# Patient Record
Sex: Male | Born: 1935 | Race: White | Hispanic: No | Marital: Married | Smoking: Former smoker
Health system: Southern US, Community
[De-identification: ages and names within clinical notes are randomized; demographics above are authoritative.]

## PROBLEM LIST (undated history)

## (undated) DIAGNOSIS — E119 Type 2 diabetes mellitus without complications: Secondary | ICD-10-CM

## (undated) DIAGNOSIS — Z87442 Personal history of urinary calculi: Secondary | ICD-10-CM

## (undated) DIAGNOSIS — I10 Essential (primary) hypertension: Secondary | ICD-10-CM

## (undated) DIAGNOSIS — N183 Chronic kidney disease, stage 3 unspecified: Secondary | ICD-10-CM

## (undated) DIAGNOSIS — Q613 Polycystic kidney, unspecified: Secondary | ICD-10-CM

## (undated) DIAGNOSIS — E785 Hyperlipidemia, unspecified: Secondary | ICD-10-CM

## (undated) DIAGNOSIS — G4730 Sleep apnea, unspecified: Secondary | ICD-10-CM

## (undated) DIAGNOSIS — F419 Anxiety disorder, unspecified: Secondary | ICD-10-CM

## (undated) DIAGNOSIS — C9110 Chronic lymphocytic leukemia of B-cell type not having achieved remission: Secondary | ICD-10-CM

## (undated) HISTORY — PX: CERVICAL FUSION: SHX112

## (undated) HISTORY — PX: CHOLECYSTECTOMY: SHX55

## (undated) HISTORY — DX: Type 2 diabetes mellitus without complications: E11.9

---

## 1987-12-09 HISTORY — PX: CARDIAC CATHETERIZATION: SHX172

## 2004-11-29 ENCOUNTER — Ambulatory Visit: Payer: Self-pay | Admitting: Urology

## 2005-05-17 ENCOUNTER — Ambulatory Visit: Payer: Self-pay | Admitting: Internal Medicine

## 2005-09-12 ENCOUNTER — Ambulatory Visit: Payer: Self-pay | Admitting: Unknown Physician Specialty

## 2005-12-09 ENCOUNTER — Ambulatory Visit: Payer: Self-pay | Admitting: Internal Medicine

## 2005-12-13 ENCOUNTER — Ambulatory Visit: Payer: Self-pay | Admitting: Internal Medicine

## 2006-01-08 ENCOUNTER — Ambulatory Visit: Payer: Self-pay | Admitting: Internal Medicine

## 2006-03-15 ENCOUNTER — Ambulatory Visit: Payer: Self-pay | Admitting: Internal Medicine

## 2006-04-09 ENCOUNTER — Ambulatory Visit: Payer: Self-pay | Admitting: Internal Medicine

## 2006-06-21 ENCOUNTER — Ambulatory Visit: Payer: Self-pay | Admitting: Internal Medicine

## 2006-07-10 ENCOUNTER — Ambulatory Visit: Payer: Self-pay | Admitting: Internal Medicine

## 2007-02-01 ENCOUNTER — Ambulatory Visit: Payer: Self-pay | Admitting: Internal Medicine

## 2007-06-11 ENCOUNTER — Ambulatory Visit: Payer: Self-pay | Admitting: Internal Medicine

## 2007-06-22 ENCOUNTER — Ambulatory Visit: Payer: Self-pay | Admitting: Internal Medicine

## 2007-06-25 ENCOUNTER — Ambulatory Visit: Payer: Self-pay | Admitting: Internal Medicine

## 2007-07-11 ENCOUNTER — Ambulatory Visit: Payer: Self-pay | Admitting: Internal Medicine

## 2007-09-10 ENCOUNTER — Ambulatory Visit: Payer: Self-pay | Admitting: Internal Medicine

## 2007-09-21 ENCOUNTER — Ambulatory Visit: Payer: Self-pay | Admitting: Internal Medicine

## 2007-10-11 ENCOUNTER — Ambulatory Visit: Payer: Self-pay | Admitting: Internal Medicine

## 2007-11-11 ENCOUNTER — Ambulatory Visit: Payer: Self-pay | Admitting: Internal Medicine

## 2007-12-09 ENCOUNTER — Ambulatory Visit: Payer: Self-pay | Admitting: Internal Medicine

## 2008-01-09 ENCOUNTER — Ambulatory Visit: Payer: Self-pay | Admitting: Internal Medicine

## 2008-02-08 ENCOUNTER — Ambulatory Visit: Payer: Self-pay | Admitting: Internal Medicine

## 2008-02-12 ENCOUNTER — Ambulatory Visit: Payer: Self-pay | Admitting: Internal Medicine

## 2008-08-10 ENCOUNTER — Ambulatory Visit: Payer: Self-pay | Admitting: Internal Medicine

## 2008-08-18 ENCOUNTER — Ambulatory Visit: Payer: Self-pay | Admitting: Internal Medicine

## 2009-02-07 ENCOUNTER — Ambulatory Visit: Payer: Self-pay | Admitting: Internal Medicine

## 2009-02-09 ENCOUNTER — Ambulatory Visit: Payer: Self-pay | Admitting: Internal Medicine

## 2009-03-10 ENCOUNTER — Ambulatory Visit: Payer: Self-pay | Admitting: Internal Medicine

## 2009-08-31 ENCOUNTER — Ambulatory Visit: Payer: Self-pay | Admitting: Internal Medicine

## 2009-12-29 ENCOUNTER — Inpatient Hospital Stay: Payer: Self-pay | Admitting: Podiatry

## 2010-01-08 ENCOUNTER — Emergency Department: Payer: Self-pay | Admitting: Emergency Medicine

## 2010-02-07 ENCOUNTER — Ambulatory Visit: Payer: Self-pay | Admitting: Internal Medicine

## 2010-02-09 ENCOUNTER — Ambulatory Visit: Payer: Self-pay | Admitting: Internal Medicine

## 2010-03-10 ENCOUNTER — Ambulatory Visit: Payer: Self-pay | Admitting: Internal Medicine

## 2010-07-12 ENCOUNTER — Ambulatory Visit: Payer: Self-pay | Admitting: Unknown Physician Specialty

## 2011-02-15 ENCOUNTER — Ambulatory Visit: Payer: Self-pay | Admitting: Internal Medicine

## 2011-03-11 ENCOUNTER — Ambulatory Visit: Payer: Self-pay | Admitting: Internal Medicine

## 2011-04-08 ENCOUNTER — Ambulatory Visit: Payer: Self-pay | Admitting: Vascular Surgery

## 2011-04-12 ENCOUNTER — Ambulatory Visit: Payer: Self-pay | Admitting: Vascular Surgery

## 2012-02-15 ENCOUNTER — Ambulatory Visit: Payer: Self-pay | Admitting: Internal Medicine

## 2012-02-15 LAB — CBC CANCER CENTER
Basophil #: 0 x10 3/mm (ref 0.0–0.1)
Eosinophil #: 0.2 x10 3/mm (ref 0.0–0.7)
HGB: 11.5 g/dL — ABNORMAL LOW (ref 13.0–18.0)
Lymphocyte %: 79.3 %
MCHC: 32.2 g/dL (ref 32.0–36.0)
MCV: 100 fL (ref 80–100)
Monocyte #: 0.5 x10 3/mm (ref 0.2–1.0)
Neutrophil #: 3.3 x10 3/mm (ref 1.4–6.5)
Neutrophil %: 17 %
Platelet: 89 x10 3/mm — ABNORMAL LOW (ref 150–440)
RDW: 14.5 % (ref 11.5–14.5)
WBC: 19.5 x10 3/mm — ABNORMAL HIGH (ref 3.8–10.6)

## 2012-02-15 LAB — CREATININE, SERUM
Creatinine: 1.86 mg/dL — ABNORMAL HIGH (ref 0.60–1.30)
EGFR (African American): 40 — ABNORMAL LOW

## 2012-03-10 ENCOUNTER — Ambulatory Visit: Payer: Self-pay | Admitting: Internal Medicine

## 2012-06-25 ENCOUNTER — Ambulatory Visit: Payer: Self-pay | Admitting: Internal Medicine

## 2013-02-14 ENCOUNTER — Ambulatory Visit: Payer: Self-pay | Admitting: Internal Medicine

## 2013-02-15 LAB — CBC CANCER CENTER
HGB: 12.6 g/dL — ABNORMAL LOW (ref 13.0–18.0)
Lymphocytes: 73 %
MCH: 33.5 pg (ref 26.0–34.0)
MCHC: 33.9 g/dL (ref 32.0–36.0)
MCV: 99 fL (ref 80–100)
Monocytes: 2 %
Platelet: 87 x10 3/mm — ABNORMAL LOW (ref 150–440)
RBC: 3.74 10*6/uL — ABNORMAL LOW (ref 4.40–5.90)
Segmented Neutrophils: 14 %
Variant Lymphocyte: 10 %

## 2013-02-15 LAB — CREATININE, SERUM
EGFR (African American): 37 — ABNORMAL LOW
EGFR (Non-African Amer.): 32 — ABNORMAL LOW

## 2013-02-15 LAB — LACTATE DEHYDROGENASE: LDH: 321 U/L — ABNORMAL HIGH (ref 85–241)

## 2013-03-10 ENCOUNTER — Ambulatory Visit: Payer: Self-pay | Admitting: Internal Medicine

## 2013-05-30 ENCOUNTER — Ambulatory Visit: Payer: Self-pay | Admitting: Internal Medicine

## 2013-06-07 DIAGNOSIS — Z85828 Personal history of other malignant neoplasm of skin: Secondary | ICD-10-CM | POA: Insufficient documentation

## 2013-06-10 ENCOUNTER — Ambulatory Visit: Payer: Self-pay | Admitting: Internal Medicine

## 2014-02-20 ENCOUNTER — Ambulatory Visit: Payer: Self-pay | Admitting: Internal Medicine

## 2014-02-21 LAB — CBC CANCER CENTER
BASOS ABS: 0.1 x10 3/mm (ref 0.0–0.1)
BASOS PCT: 0.3 %
EOS ABS: 0.3 x10 3/mm (ref 0.0–0.7)
Eosinophil %: 1.4 %
HCT: 36.2 % — AB (ref 40.0–52.0)
HGB: 12.1 g/dL — ABNORMAL LOW (ref 13.0–18.0)
LYMPHS PCT: 83.1 %
Lymphocyte #: 16.9 x10 3/mm — ABNORMAL HIGH (ref 1.0–3.6)
MCH: 32.5 pg (ref 26.0–34.0)
MCHC: 33.4 g/dL (ref 32.0–36.0)
MCV: 98 fL (ref 80–100)
Monocyte #: 0.4 x10 3/mm (ref 0.2–1.0)
Monocyte %: 1.8 %
NEUTROS PCT: 13.4 %
Neutrophil #: 2.7 x10 3/mm (ref 1.4–6.5)
Platelet: 76 x10 3/mm — ABNORMAL LOW (ref 150–440)
RBC: 3.71 10*6/uL — AB (ref 4.40–5.90)
RDW: 14.3 % (ref 11.5–14.5)
WBC: 20.3 x10 3/mm — ABNORMAL HIGH (ref 3.8–10.6)

## 2014-02-21 LAB — CREATININE, SERUM
Creatinine: 1.57 mg/dL — ABNORMAL HIGH (ref 0.60–1.30)
EGFR (African American): 49 — ABNORMAL LOW
GFR CALC NON AF AMER: 42 — AB

## 2014-02-21 LAB — LACTATE DEHYDROGENASE: LDH: 355 U/L — AB (ref 85–241)

## 2014-02-24 LAB — URINE IEP, RANDOM

## 2014-02-26 LAB — PROT IMMUNOELECTROPHORES(ARMC)

## 2014-03-10 ENCOUNTER — Ambulatory Visit: Payer: Self-pay | Admitting: Internal Medicine

## 2014-04-17 DIAGNOSIS — E119 Type 2 diabetes mellitus without complications: Secondary | ICD-10-CM | POA: Insufficient documentation

## 2014-04-17 DIAGNOSIS — I714 Abdominal aortic aneurysm, without rupture, unspecified: Secondary | ICD-10-CM | POA: Insufficient documentation

## 2014-04-17 DIAGNOSIS — E785 Hyperlipidemia, unspecified: Secondary | ICD-10-CM | POA: Insufficient documentation

## 2014-04-17 DIAGNOSIS — C9110 Chronic lymphocytic leukemia of B-cell type not having achieved remission: Secondary | ICD-10-CM | POA: Insufficient documentation

## 2014-04-17 DIAGNOSIS — N183 Chronic kidney disease, stage 3 unspecified: Secondary | ICD-10-CM | POA: Insufficient documentation

## 2014-04-17 DIAGNOSIS — I10 Essential (primary) hypertension: Secondary | ICD-10-CM | POA: Insufficient documentation

## 2014-04-17 DIAGNOSIS — N1832 Chronic kidney disease, stage 3b: Secondary | ICD-10-CM | POA: Insufficient documentation

## 2014-04-17 DIAGNOSIS — E1122 Type 2 diabetes mellitus with diabetic chronic kidney disease: Secondary | ICD-10-CM | POA: Insufficient documentation

## 2014-04-17 DIAGNOSIS — G4733 Obstructive sleep apnea (adult) (pediatric): Secondary | ICD-10-CM | POA: Insufficient documentation

## 2014-07-16 DIAGNOSIS — Z794 Long term (current) use of insulin: Secondary | ICD-10-CM | POA: Insufficient documentation

## 2014-11-26 ENCOUNTER — Ambulatory Visit: Payer: Self-pay | Admitting: Internal Medicine

## 2014-12-10 ENCOUNTER — Ambulatory Visit: Payer: Self-pay | Admitting: Internal Medicine

## 2014-12-24 ENCOUNTER — Ambulatory Visit: Payer: Self-pay | Admitting: Ophthalmology

## 2015-01-21 ENCOUNTER — Ambulatory Visit
Admit: 2015-01-21 | Disposition: A | Discharge status: Home / Self Care | Payer: Self-pay | Attending: Ophthalmology | Admitting: Ophthalmology

## 2015-03-19 ENCOUNTER — Other Ambulatory Visit: Payer: Self-pay

## 2015-03-19 DIAGNOSIS — D696 Thrombocytopenia, unspecified: Secondary | ICD-10-CM

## 2015-03-19 DIAGNOSIS — C9110 Chronic lymphocytic leukemia of B-cell type not having achieved remission: Secondary | ICD-10-CM

## 2015-03-20 ENCOUNTER — Inpatient Hospital Stay: Payer: Medicare Other | Attending: Internal Medicine

## 2015-03-20 ENCOUNTER — Inpatient Hospital Stay (HOSPITAL_BASED_OUTPATIENT_CLINIC_OR_DEPARTMENT_OTHER): Payer: Medicare Other | Admitting: Internal Medicine

## 2015-03-20 VITALS — BP 147/80 | HR 53 | Temp 97.1°F | Resp 18 | Ht 67.0 in | Wt 240.1 lb

## 2015-03-20 DIAGNOSIS — I129 Hypertensive chronic kidney disease with stage 1 through stage 4 chronic kidney disease, or unspecified chronic kidney disease: Secondary | ICD-10-CM | POA: Diagnosis not present

## 2015-03-20 DIAGNOSIS — E119 Type 2 diabetes mellitus without complications: Secondary | ICD-10-CM | POA: Insufficient documentation

## 2015-03-20 DIAGNOSIS — Z8679 Personal history of other diseases of the circulatory system: Secondary | ICD-10-CM | POA: Diagnosis not present

## 2015-03-20 DIAGNOSIS — Z79899 Other long term (current) drug therapy: Secondary | ICD-10-CM | POA: Diagnosis not present

## 2015-03-20 DIAGNOSIS — E785 Hyperlipidemia, unspecified: Secondary | ICD-10-CM | POA: Diagnosis not present

## 2015-03-20 DIAGNOSIS — Z7982 Long term (current) use of aspirin: Secondary | ICD-10-CM | POA: Diagnosis not present

## 2015-03-20 DIAGNOSIS — C9110 Chronic lymphocytic leukemia of B-cell type not having achieved remission: Secondary | ICD-10-CM

## 2015-03-20 DIAGNOSIS — L409 Psoriasis, unspecified: Secondary | ICD-10-CM | POA: Insufficient documentation

## 2015-03-20 DIAGNOSIS — N189 Chronic kidney disease, unspecified: Secondary | ICD-10-CM | POA: Diagnosis not present

## 2015-03-20 DIAGNOSIS — D696 Thrombocytopenia, unspecified: Secondary | ICD-10-CM | POA: Diagnosis not present

## 2015-03-20 DIAGNOSIS — G4730 Sleep apnea, unspecified: Secondary | ICD-10-CM | POA: Diagnosis not present

## 2015-03-20 DIAGNOSIS — Z794 Long term (current) use of insulin: Secondary | ICD-10-CM | POA: Diagnosis not present

## 2015-03-20 LAB — CBC WITH DIFFERENTIAL/PLATELET
BASOS ABS: 0 10*3/uL (ref 0–0.1)
Basophils Relative: 0 %
EOS PCT: 1 %
Eosinophils Absolute: 0.3 10*3/uL (ref 0–0.7)
HCT: 36.1 % — ABNORMAL LOW (ref 40.0–52.0)
HEMOGLOBIN: 11.4 g/dL — AB (ref 13.0–18.0)
LYMPHS ABS: 18.1 10*3/uL — AB (ref 1.0–3.6)
LYMPHS PCT: 83 %
MCH: 31.5 pg (ref 26.0–34.0)
MCHC: 31.6 g/dL — ABNORMAL LOW (ref 32.0–36.0)
MCV: 99.7 fL (ref 80.0–100.0)
MONO ABS: 0.4 10*3/uL (ref 0.2–1.0)
MONOS PCT: 2 %
NEUTROS PCT: 14 %
Neutro Abs: 3 10*3/uL (ref 1.4–6.5)
Platelets: 95 10*3/uL — ABNORMAL LOW (ref 150–440)
RBC: 3.62 MIL/uL — ABNORMAL LOW (ref 4.40–5.90)
RDW: 14.9 % — AB (ref 11.5–14.5)
WBC: 21.8 10*3/uL — ABNORMAL HIGH (ref 3.8–10.6)

## 2015-03-20 LAB — CREATININE, SERUM
CREATININE: 1.61 mg/dL — AB (ref 0.61–1.24)
GFR, EST AFRICAN AMERICAN: 46 mL/min — AB (ref >60)
GFR, EST NON AFRICAN AMERICAN: 39 mL/min — AB (ref >60)

## 2015-03-20 LAB — LACTATE DEHYDROGENASE: LDH: 290 U/L — AB (ref 98–192)

## 2015-03-29 NOTE — Progress Notes (Signed)
[name]  Telephone:(336) [phone] Fax:(336) [phone]     ID: Patrick Payne OB: 03-Dec-1933  MR#: [id number]  HYW#:[id number]  Patient Care Team: Idelle Crouch, MD as PCP - General (Internal Medicine)  CHIEF COMPLAINT/DIAGNOSIS:  1. 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 On protocol ([id number]). March 2007 the SIEP showed normal IgA and IgG, IgM level slightly low at 16.   2. 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  HISTORY OF PRESENT ILLNESS:  Patient returns for continued hematology followup for CLL, he was seen one year ago. States that he is doing steady and denies any new complaints. Appetite is good, denies weight loss. Denies fevers or night sweats. He has not had any respiratory infections. No new cough, sputum, chest pain, or hemoptysis. No new bone pains. He has mild thrombocytopenia, denies any bleeding symptoms.States he is physically acitve.  REVIEW OF SYSTEMS:   ROS As in HPI above. In addition, no fever, chills or sweats. No new headaches or focal weakness.  No new mood disturbances. No  sore throat, cough, shortness of breath, sputum, hemoptysis or chest pain. No dizziness or palpitation. No abdominal pain, constipation, diarrhea, dysuria or hematuria. No new skin rash or bleeding symptoms. No new paresthesias in extremities. PS ECOG 0  PAST MEDICAL HISTORY: Reviewed         Diabetes mellitus  Hypertension  Sleep apnea, on CPAP  Obesity  Diverticulosis  Hyperlipidemia  Persistent leukocytosis and lymphocytosis  Nephrolithiasis  Abdominal aortic aneurysm  Polycystic kidney disease  Psoriasis  Cholecystectomy.  PAST SURGICAL HISTORY:Reviewed As above.  FAMILY HISTORY:Reviewed. No history of hematological disorders in the family. Positive for ovarian and lung cancer, diabetes and hypertension.  SOCIAL HISTORY: Reviewed History  Substance Use Topics  . Smoking status: Not on file  . Smokeless  tobacco: Not on file  . Alcohol Use: Not on file  Chronic smoking, quit in 1997, up to 3 packs per day. No history of alcohol use.  ALLERGIES:       Celebrex: Other  Thorazine: Dizzy/Fainting  Penicillin: Rash  Prednisone: Swelling  Invanz: Other  Canagliflozin (Invokana) causes fungal infection.: Other  Mobic: Unknown  Current Outpatient Prescriptions  Medication Sig Dispense Refill  . allopurinol (ZYLOPRIM) 100 MG tablet Take 100 mg by mouth daily.    Marland Kitchen aspirin 81 MG tablet Take 81 mg by mouth daily.    Marland Kitchen atenolol (TENORMIN) 50 MG tablet Take 50 mg by mouth daily.    Marland Kitchen atorvastatin (LIPITOR) 10 MG tablet Take 10 mg by mouth daily.    . Cholecalciferol (VITAMIN D-3) 1000 UNITS CAPS Take 1,000 Units by mouth at bedtime.    . clonazePAM (KLONOPIN) 1 MG tablet Take 1 mg by mouth 2 (two) times daily.    . fexofenadine (ALLEGRA) 180 MG tablet Take 180 mg by mouth daily.    Marland Kitchen GLIPIZIDE ER PO Take 10 mg by mouth daily.    . insulin glargine (LANTUS) 100 UNIT/ML injection Inject 19 Units into the skin at bedtime.    Marland Kitchen LORazepam (ATIVAN) 1 MG tablet Take 1 mg by mouth every 8 (eight) hours.    . Multiple Vitamins-Minerals (PRESERVISION AREDS 2 PO) Take 5 mg by mouth 2 (two) times daily.    Marland Kitchen omega-3 acid ethyl esters (LOVAZA) 1 G capsule Take 1 g by mouth 2 (two) times daily.    Marland Kitchen triamcinolone (NASACORT ALLERGY 24HR) 55 MCG/ACT AERO nasal inhaler Place 2 sprays into the nose  daily.    . vitamin B-12 (CYANOCOBALAMIN) 500 MCG tablet Take 500 mcg by mouth daily.     No current facility-administered medications for this visit.    OBJECTIVE: Filed Vitals:   03/20/15 1123  BP:   Pulse:   Temp:   Resp: 18     Body mass index is 37.59 kg/(m^2).    ECOG FS:0 - Asymptomatic  GENERAL: Patient is alert and oriented and in no acute distress. There is no icterus. HEENT: EOMs intact. Oral exam negative for thrush or lesions. No cervical lymphadenopathy. CVS: S1S2, regular LUNGS: Bilaterally  clear to auscultation, no rhonchi. ABDOMEN: Soft, nontender. No hepatosplenomegaly clinically.  EXTREMITIES: No pedal edema. LYMPHATICS: No palpable adenopathy in axillary or inguinal areas.   LAB RESULTS:     Component Value Date/Time   CREATININE 1.61* 03/20/2015 1103   CREATININE 1.57* 02/21/2014 1340   GFRNONAA 39* 03/20/2015 1103   GFRNONAA 42* 02/21/2014 1340   GFRAA 46* 03/20/2015 1103   GFRAA 49* 02/21/2014 1340   Lab Results  Component Value Date   WBC 21.8* 03/20/2015   NEUTROABS 3.0 03/20/2015   HGB 11.4* 03/20/2015   HCT 36.1* 03/20/2015   MCV 99.7 03/20/2015   PLT 95* 03/20/2015     STUDIES: 02/21/14 -  Random-UIEP -   M-Spike 8.3 %. Immunofixation Result,        Bence Jones Protein positive; lambda type. SIEP - IgG low at 554, IgA low at 148, IgM low at 19. M-Spike Not Observed. An apparent normal immunofixation pattern.  Aug 2014 - 24-hour UIEP.  Prot,24hr calculated 360.1 mg/24 hr.    M-Spike 10.5 %  M-Spike, mg/24 hr is 38 mg/24 hr  Immunofixation Result, Bence Jones Protein positive; lambda type.  ASSESSMENT / PLAN:   1. Chronic lymphocytic leukemia.  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 Overall he does not have any symptoms of fevers, night sweats or recurrent infections.  No hepatosplenomegaly on exam today.  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  Patient was explained that he does not need any treatment for CLL or thrombocytopenia at this time, and recommended continued surveillance, he sees PMD at least twice a year and will get his CBCs done there faxed to [place]. Otherwise will followup here in one year with repeat labs. 2. Elevated Cr - has chronic renal insufficency, states that he is following with nephrology. 3. In between  visits, the patient has been advised to call or come to the ER in case of fevers, night sweats, bleeding, lymph node masses felt on self-exam, unintentional weight loss, recurrent infections and will be evaluated sooner. He is agreeable to this plan.    Leia Alf, MD   03/29/2015 12:29 AM

## 2016-03-18 ENCOUNTER — Other Ambulatory Visit: Payer: Medicare Other

## 2016-03-18 ENCOUNTER — Ambulatory Visit: Payer: Medicare Other | Admitting: Internal Medicine

## 2016-03-25 ENCOUNTER — Other Ambulatory Visit: Payer: Self-pay | Admitting: *Deleted

## 2016-03-25 DIAGNOSIS — C9110 Chronic lymphocytic leukemia of B-cell type not having achieved remission: Secondary | ICD-10-CM

## 2016-03-29 ENCOUNTER — Inpatient Hospital Stay: Payer: Medicare Other | Attending: Oncology | Admitting: Oncology

## 2016-03-29 ENCOUNTER — Inpatient Hospital Stay: Payer: Medicare Other

## 2016-03-29 ENCOUNTER — Encounter: Payer: Self-pay | Admitting: Oncology

## 2016-03-29 VITALS — BP 144/79 | HR 58 | Temp 98.0°F | Wt 247.4 lb

## 2016-03-29 DIAGNOSIS — Z79899 Other long term (current) drug therapy: Secondary | ICD-10-CM | POA: Diagnosis not present

## 2016-03-29 DIAGNOSIS — Z8041 Family history of malignant neoplasm of ovary: Secondary | ICD-10-CM | POA: Diagnosis not present

## 2016-03-29 DIAGNOSIS — D696 Thrombocytopenia, unspecified: Secondary | ICD-10-CM | POA: Insufficient documentation

## 2016-03-29 DIAGNOSIS — E1122 Type 2 diabetes mellitus with diabetic chronic kidney disease: Secondary | ICD-10-CM | POA: Insufficient documentation

## 2016-03-29 DIAGNOSIS — Z7982 Long term (current) use of aspirin: Secondary | ICD-10-CM | POA: Diagnosis not present

## 2016-03-29 DIAGNOSIS — C9110 Chronic lymphocytic leukemia of B-cell type not having achieved remission: Secondary | ICD-10-CM

## 2016-03-29 DIAGNOSIS — Z801 Family history of malignant neoplasm of trachea, bronchus and lung: Secondary | ICD-10-CM | POA: Diagnosis not present

## 2016-03-29 DIAGNOSIS — N189 Chronic kidney disease, unspecified: Secondary | ICD-10-CM | POA: Diagnosis not present

## 2016-03-29 LAB — COMPREHENSIVE METABOLIC PANEL
ALK PHOS: 92 U/L (ref 38–126)
ALT: 20 U/L (ref 17–63)
ANION GAP: 5 (ref 5–15)
AST: 19 U/L (ref 15–41)
Albumin: 3.9 g/dL (ref 3.5–5.0)
BUN: 35 mg/dL — ABNORMAL HIGH (ref 6–20)
CALCIUM: 8.1 mg/dL — AB (ref 8.9–10.3)
CO2: 25 mmol/L (ref 22–32)
Chloride: 108 mmol/L (ref 101–111)
Creatinine, Ser: 1.7 mg/dL — ABNORMAL HIGH (ref 0.61–1.24)
GFR calc non Af Amer: 37 mL/min — ABNORMAL LOW (ref >60)
GFR, EST AFRICAN AMERICAN: 42 mL/min — AB (ref >60)
Glucose, Bld: 111 mg/dL — ABNORMAL HIGH (ref 65–99)
Potassium: 4.2 mmol/L (ref 3.5–5.1)
SODIUM: 138 mmol/L (ref 135–145)
Total Bilirubin: 0.7 mg/dL (ref 0.3–1.2)
Total Protein: 6.5 g/dL (ref 6.5–8.1)

## 2016-03-29 LAB — CBC WITH DIFFERENTIAL/PLATELET
Basophils Absolute: 0 10*3/uL (ref 0–0.1)
Basophils Relative: 0 %
EOS ABS: 0.2 10*3/uL (ref 0–0.7)
EOS PCT: 1 %
HCT: 33.5 % — ABNORMAL LOW (ref 40.0–52.0)
HEMOGLOBIN: 11.1 g/dL — AB (ref 13.0–18.0)
LYMPHS ABS: 17.1 10*3/uL — AB (ref 1.0–3.6)
Lymphocytes Relative: 83 %
MCH: 33.6 pg (ref 26.0–34.0)
MCHC: 33.3 g/dL (ref 32.0–36.0)
MCV: 101 fL — ABNORMAL HIGH (ref 80.0–100.0)
MONOS PCT: 2 %
Monocytes Absolute: 0.4 10*3/uL (ref 0.2–1.0)
Neutro Abs: 2.8 10*3/uL (ref 1.4–6.5)
Neutrophils Relative %: 14 %
PLATELETS: 78 10*3/uL — AB (ref 150–440)
RBC: 3.31 MIL/uL — ABNORMAL LOW (ref 4.40–5.90)
RDW: 15 % — ABNORMAL HIGH (ref 11.5–14.5)
WBC: 20.6 10*3/uL — ABNORMAL HIGH (ref 3.8–10.6)

## 2016-03-29 LAB — LACTATE DEHYDROGENASE: LDH: 329 U/L — ABNORMAL HIGH (ref 98–192)

## 2016-03-30 NOTE — Progress Notes (Signed)
[place]  Telephone:(336) [phone] Fax:(336) [phone]  ID: Patrick Payne OB: [birth year]  MR#: [id number]  [id number]  Patient Care Team: Idelle Crouch, MD as PCP - General (Internal Medicine)  CHIEF COMPLAINT:  Chief Complaint  Patient presents with  . CLL (chronic lymphocytic leukemia)    INTERVAL HISTORY: Patient is a 82 year old male with a long-standing history of CLL who returns to clinic today for yearly follow-up. He continues to feel well and remains asymptomatic. He denies any fevers, night sweats, or weight loss. He has noted no new lymphadenopathy. He has no neurologic complaints. He denies any chest pain or shortness of breath. He denies any nausea, vomiting, constipation, or diarrhea. He has no urinary complaints. Patient feels at his baseline and offers no specific complaints today.  REVIEW OF SYSTEMS:   Review of Systems  Constitutional: Negative.  Negative for fever, weight loss, malaise/fatigue and diaphoresis.  Respiratory: Negative.  Negative for shortness of breath.   Cardiovascular: Negative.  Negative for chest pain.  Gastrointestinal: Negative.  Negative for abdominal pain, blood in stool and melena.  Genitourinary: Negative.   Neurological: Negative.  Negative for weakness.  Psychiatric/Behavioral: Negative.     As per HPI. Otherwise, a complete review of systems is negatve.  PAST MEDICAL HISTORY: Past Medical History  Diagnosis Date  . Diabetes mellitus without complication ([place])     PAST SURGICAL HISTORY: No past surgical history on file.  FAMILY HISTORY: Reported history of ovarian and lung cancer. Diabetes, hypertension.     ADVANCED DIRECTIVES:    HEALTH MAINTENANCE: Social History  Substance Use Topics  . Smoking status: Not on file  . Smokeless tobacco: Not on file  . Alcohol Use: No     Colonoscopy:  PAP:  Bone density:  Lipid panel:  Allergies  Allergen Reactions  . Celecoxib Other (See  Comments)    Increased Blood Pressure  . Chlorpromazine Nausea Only  . Meloxicam Other (See Comments)    Hypertension  . Prednisone Other (See Comments)    Diabetic  . Amoxicillin-Pot Clavulanate Rash  . Penicillin V Potassium Rash    Current Outpatient Prescriptions  Medication Sig Dispense Refill  . Aflibercept (EYLEA) 2 MG/0.05ML SOLN     . allopurinol (ZYLOPRIM) 100 MG tablet Take 100 mg by mouth daily.    Marland Kitchen aspirin 81 MG tablet Take 81 mg by mouth daily.    Marland Kitchen atenolol (TENORMIN) 50 MG tablet     . atorvastatin (LIPITOR) 10 MG tablet     . Cholecalciferol (VITAMIN D-1000 MAX ST) 1000 units tablet Take by mouth.    . Cholecalciferol (VITAMIN D-3) 1000 UNITS CAPS Take 1,000 Units by mouth at bedtime.    . clonazePAM (KLONOPIN) 1 MG tablet     . Cyanocobalamin (RA VITAMIN B-12 TR) 1000 MCG TBCR Take by mouth.    . Difluprednate (DUREZOL) 0.05 % EMUL     . econazole nitrate 1 % cream Apply topically.    . fexofenadine (ALLEGRA) 180 MG tablet Take by mouth.    Marland Kitchen glipiZIDE (GLUCOTROL XL) 10 MG 24 hr tablet     . glucose blood test strip     . Insulin Glargine (LANTUS SOLOSTAR) 100 UNIT/ML Solostar Pen     . Insulin Pen Needle (BD PEN NEEDLE NANO U/F) 32G X 4 MM MISC     . Iodoquinol-HC (HYDROCORTISONE-IODOQUINOL) 1-1 % CREA Apply topically 2 (two) times daily.  0  . ketorolac (ACULAR) 0.4 % SOLN     .  LORazepam (ATIVAN) 1 MG tablet     . moxifloxacin (VIGAMOX) 0.5 % ophthalmic solution     . Multiple Vitamins-Minerals (PRESERVISION AREDS 2 PO) Take 5 mg by mouth 2 (two) times daily.    Marland Kitchen omega-3 acid ethyl esters (LOVAZA) 1 g capsule     . pioglitazone (ACTOS) 45 MG tablet     . TACLONEX external suspension   0  . telmisartan-hydrochlorothiazide (MICARDIS HCT) 80-25 MG tablet Take by mouth.    . triamcinolone (NASACORT) 55 MCG/ACT AERO nasal inhaler Place into the nose.    . vitamin B-12 (CYANOCOBALAMIN) 500 MCG tablet Take 500 mcg by mouth daily.     No current  facility-administered medications for this visit.    OBJECTIVE: Filed Vitals:   03/29/16 1514  BP: 144/79  Pulse: 58  Temp: 98 F (36.7 C)     Body mass index is 38.73 kg/(m^2).    ECOG FS:0 - Asymptomatic  General: Well-developed, well-nourished, no acute distress. Eyes: Pink conjunctiva, anicteric sclera. HEENT: Normocephalic, moist mucous membranes, clear oropharnyx. Lungs: Clear to auscultation bilaterally. Heart: Regular rate and rhythm. No rubs, murmurs, or gallops. Abdomen: Soft, nontender, nondistended. No organomegaly noted, normoactive bowel sounds. Musculoskeletal: No edema, cyanosis, or clubbing. Neuro: Alert, answering all questions appropriately. Cranial nerves grossly intact. Skin: No rashes or petechiae noted. Psych: Normal affect. Lymphatics: No cervical, calvicular, axillary or inguinal LAD.   LAB RESULTS:  Lab Results  Component Value Date   NA 138 03/29/2016   K 4.2 03/29/2016   CL 108 03/29/2016   CO2 25 03/29/2016   GLUCOSE 111* 03/29/2016   BUN 35* 03/29/2016   CREATININE 1.70* 03/29/2016   CALCIUM 8.1* 03/29/2016   PROT 6.5 03/29/2016   ALBUMIN 3.9 03/29/2016   AST 19 03/29/2016   ALT 20 03/29/2016   ALKPHOS 92 03/29/2016   BILITOT 0.7 03/29/2016   GFRNONAA 37* 03/29/2016   GFRAA 42* 03/29/2016    Lab Results  Component Value Date   WBC 20.6* 03/29/2016   NEUTROABS 2.8 03/29/2016   HGB 11.1* 03/29/2016   HCT 33.5* 03/29/2016   MCV 101.0* 03/29/2016   PLT 78* 03/29/2016     STUDIES: No results found.  ASSESSMENT: Rai stage I CLL.  PLAN:    1. CLL: Patient's white blood cell count remains elevated, but relatively stable and unchanged for at least 10 years. No treatment is required. Continue simple observation. Patient has his blood checked 1 or 2 times per year by primary care. Return to clinic in 1 year for repeat laboratory work and further evaluation. 2. Thrombocytopenia: Although patient's platelet count is decreased at 78,  it is unchanged for at least 10 years as well. No intervention is needed at this time. Monitor. 3. Chronic renal insufficiency: Patient's creatinine appears to be at his baseline, monitor.   Patient expressed understanding and was in agreement with this plan. He also understands that He can call clinic at any time with any questions, concerns, or complaints.   Lloyd Huger, MD   03/30/2016 12:02 AM

## 2016-08-22 ENCOUNTER — Other Ambulatory Visit (INDEPENDENT_AMBULATORY_CARE_PROVIDER_SITE_OTHER): Payer: Self-pay | Admitting: Vascular Surgery

## 2016-08-22 DIAGNOSIS — I714 Abdominal aortic aneurysm, without rupture, unspecified: Secondary | ICD-10-CM

## 2016-08-26 ENCOUNTER — Ambulatory Visit (INDEPENDENT_AMBULATORY_CARE_PROVIDER_SITE_OTHER): Payer: Medicare Other

## 2016-08-26 ENCOUNTER — Encounter (INDEPENDENT_AMBULATORY_CARE_PROVIDER_SITE_OTHER): Payer: Self-pay | Admitting: Vascular Surgery

## 2016-08-26 ENCOUNTER — Ambulatory Visit (INDEPENDENT_AMBULATORY_CARE_PROVIDER_SITE_OTHER): Payer: Medicare Other | Admitting: Vascular Surgery

## 2016-08-26 VITALS — BP 135/69 | HR 52 | Resp 16 | Ht 67.0 in | Wt 248.0 lb

## 2016-08-26 DIAGNOSIS — I714 Abdominal aortic aneurysm, without rupture, unspecified: Secondary | ICD-10-CM

## 2016-08-26 DIAGNOSIS — E118 Type 2 diabetes mellitus with unspecified complications: Secondary | ICD-10-CM | POA: Diagnosis not present

## 2016-08-26 DIAGNOSIS — I10 Essential (primary) hypertension: Secondary | ICD-10-CM | POA: Diagnosis not present

## 2016-08-26 NOTE — Assessment & Plan Note (Signed)
blood glucose control important in reducing the progression of atherosclerotic disease. Also, involved in wound healing. On appropriate medications.  

## 2016-08-26 NOTE — Assessment & Plan Note (Signed)
blood pressure control important in reducing the progression of atherosclerotic disease and aneurysmal disease. On appropriate oral medications.  

## 2016-08-26 NOTE — Progress Notes (Signed)
MRN : [id number]  Patrick Payne is a 82 y.o. (1934/03/30) male who presents with chief complaint of  Chief Complaint  Patient presents with  . Re-evaluation    Ultrsound follow up  .  History of Present Illness: Patient returns today in follow up of His abdominal aortic aneurysm. He is doing well and does not have specific complaints today. He's had no major changes since his last visit 1 year ago. He denies any aneurysm related symptoms. Specifically, the patient denies new back or abdominal pain, or signs of peripheral embolization. His aortic duplex today reveals a stable 3.5 cm infrarenal abdominal aortic aneurysm.  Current Outpatient Prescriptions  Medication Sig Dispense Refill  . Aflibercept (EYLEA) 2 MG/0.05ML SOLN     . allopurinol (ZYLOPRIM) 100 MG tablet Take 100 mg by mouth daily.    Marland Kitchen aspirin 81 MG tablet Take 81 mg by mouth daily.    Marland Kitchen atenolol (TENORMIN) 50 MG tablet     . atorvastatin (LIPITOR) 10 MG tablet     . Cholecalciferol (VITAMIN D-3) 1000 UNITS CAPS Take 1,000 Units by mouth at bedtime.    . clonazePAM (KLONOPIN) 1 MG tablet     . Cyanocobalamin (RA VITAMIN B-12 TR) 1000 MCG TBCR Take by mouth.    . Difluprednate (DUREZOL) 0.05 % EMUL     . econazole nitrate 1 % cream Apply topically.    . fexofenadine (ALLEGRA) 180 MG tablet Take by mouth.    Marland Kitchen glipiZIDE (GLUCOTROL XL) 10 MG 24 hr tablet     . glucose blood test strip     . Insulin Glargine (LANTUS SOLOSTAR) 100 UNIT/ML Solostar Pen     . Insulin Pen Needle (BD PEN NEEDLE NANO U/F) 32G X 4 MM MISC     . Iodoquinol-HC (HYDROCORTISONE-IODOQUINOL) 1-1 % CREA Apply topically 2 (two) times daily.  0  . ketorolac (ACULAR) 0.4 % SOLN     . LORazepam (ATIVAN) 1 MG tablet     . Multiple Vitamins-Minerals (PRESERVISION AREDS 2 PO) Take 5 mg by mouth 2 (two) times daily.    Marland Kitchen omega-3 acid ethyl esters (LOVAZA) 1 g capsule     . pioglitazone (ACTOS) 45 MG tablet     . telmisartan-hydrochlorothiazide (MICARDIS  HCT) 80-25 MG tablet Take by mouth.    . triamcinolone (NASACORT) 55 MCG/ACT AERO nasal inhaler Place into the nose.    . vitamin B-12 (CYANOCOBALAMIN) 500 MCG tablet Take 500 mcg by mouth daily.     No current facility-administered medications for this visit.     Past Medical History:  Diagnosis Date  . Diabetes mellitus without complication [facility])     Past Surgical History:  Procedure Laterality Date  . CARDIAC CATHETERIZATION  12/1987  . CHOLECYSTECTOMY      Social History Social History  Substance Use Topics  . Smoking status: Former Research scientist (life sciences)  . Smokeless tobacco: Never Used     Comment: quit in Feb of 1997  . Alcohol use No      Family History Family History  Problem Relation Age of Onset  . Cancer Mother   . Varicose Veins Mother   . Cancer Father      Allergies  Allergen Reactions  . Celecoxib Other (See Comments)    Increased Blood Pressure  . Chlorpromazine Nausea Only  . Meloxicam Other (See Comments)    Hypertension  . Prednisone Other (See Comments)    Diabetic  . Amoxicillin-Pot Clavulanate Rash  . Penicillin V  Potassium Rash     REVIEW OF SYSTEMS (Negative unless checked)  Constitutional: [] Weight loss  [] Fever  [] Chills Cardiac: [] Chest pain   [] Chest pressure   [] Palpitations   [] Shortness of breath when laying flat   [] Shortness of breath at rest   [] Shortness of breath with exertion. Vascular:  [] Pain in legs with walking   [] Pain in legs at rest   [] Pain in legs when laying flat   [] Claudication   [] Pain in feet when walking  [] Pain in feet at rest  [] Pain in feet when laying flat   [] History of DVT   [] Phlebitis   [] Swelling in legs   [] Varicose veins   [] Non-healing ulcers Pulmonary:   [] Uses home oxygen   [] Productive cough   [] Hemoptysis   [] Wheeze  [] COPD   [] Asthma Neurologic:  [] Dizziness  [] Blackouts   [] Seizures   [] History of stroke   [] History of TIA  [] Aphasia   [] Temporary blindness   [] Dysphagia   [] Weakness or numbness in arms    [] Weakness or numbness in legs Musculoskeletal:  [x] Arthritis   [] Joint swelling   [x] Joint pain   [] Low back pain Hematologic:  [] Easy bruising  [] Easy bleeding   [] Hypercoagulable state   [] Anemic   Gastrointestinal:  [] Blood in stool   [] Vomiting blood  [] Gastroesophageal reflux/heartburn   [] Abdominal pain Genitourinary:  [] Chronic kidney disease   [] Difficult urination  [] Frequent urination  [] Burning with urination   [] Hematuria Skin:  [] Rashes   [] Ulcers   [] Wounds Psychological:  [] History of anxiety   []  History of major depression.  Physical Examination  BP 135/69 (BP Location: Right Arm)   Pulse (!) 52   Resp 16   Ht 5\' 7"  (1.702 m)   Wt 248 lb (112.5 kg)   BMI 38.84 kg/m  Gen:  WD/WN, NAD Head: [place]/AT, No temporalis wasting. Ear/Nose/Throat: Hearing grossly intact, nares w/o erythema or drainage, trachea midline Eyes: Conjunctiva clear. Sclera non-icteric Neck: Supple.  No JVD.  Pulmonary:  Good air movement, no use of accessory muscles.  Cardiac: RRR, normal S1, S2 Vascular:  Vessel Right Left  Radial Palpable Palpable                                   Gastrointestinal: soft, non-tender/non-distended. No guarding/reflex. Aorta not palpable due to body habitus Musculoskeletal: M/S 5/5 throughout.  No deformity or atrophy.  Neurologic: Sensation grossly intact in extremities.  Symmetrical.  Speech is fluent.  Psychiatric: Judgment intact, Mood & affect appropriate for pt's clinical situation. Dermatologic: No rashes or ulcers noted.  No cellulitis or open wounds. Lymph : No Cervical, Axillary, or Inguinal lymphadenopathy.      Labs No results found for this or any previous visit (from the past 2160 hour(s)).  Radiology No results found.   Assessment/Plan  Diabetes mellitus ([place]) blood glucose control important in reducing the progression of atherosclerotic disease. Also, involved in wound healing. On appropriate medications.   BP (high blood  pressure) blood pressure control important in reducing the progression of atherosclerotic disease and aneurysmal disease. On appropriate oral medications.   Abdominal aortic aneurysm (AAA) ([place]) The patient has a 3.5 centimeter abdominal aortic aneurysm. This is below the threshold size for prophylactic repair. I have discussed the natural history and pathophysiology of abdominal aortic aneurysms. I have discussed the risks and benefits of repair versus observation, and why continued surveillance and medical management is appropriate for aneurysms at  this size. I discussed it is important to continue surveillance of these aneurysms and we will plan to see the patient back in 12 months with noninvasive studies to reimage the aneurysm. They will contact our office with any problems in the interim.    Leotis Pain, MD  08/26/2016 10:59 AM    This note was created with Dragon medical transcription system.  Any errors from dictation are purely unintentional

## 2016-08-26 NOTE — Assessment & Plan Note (Signed)
The patient has a 3.5 centimeter abdominal aortic aneurysm. This is below the threshold size for prophylactic repair. I have discussed the natural history and pathophysiology of abdominal aortic aneurysms. I have discussed the risks and benefits of repair versus observation, and why continued surveillance and medical management is appropriate for aneurysms at this size. I discussed it is important to continue surveillance of these aneurysms and we will plan to see the patient back in 12 months with noninvasive studies to reimage the aneurysm. They will contact our office with any problems in the interim.

## 2017-02-28 ENCOUNTER — Other Ambulatory Visit: Payer: Self-pay | Admitting: Urology

## 2017-02-28 DIAGNOSIS — R310 Gross hematuria: Secondary | ICD-10-CM

## 2017-03-02 ENCOUNTER — Ambulatory Visit
Admission: RE | Admit: 2017-03-02 | Discharge: 2017-03-02 | Disposition: A | Discharge status: Home / Self Care | Payer: Medicare Other | Source: Ambulatory Visit | Attending: Urology | Admitting: Urology

## 2017-03-02 DIAGNOSIS — I2510 Atherosclerotic heart disease of native coronary artery without angina pectoris: Secondary | ICD-10-CM | POA: Diagnosis not present

## 2017-03-02 DIAGNOSIS — R310 Gross hematuria: Secondary | ICD-10-CM | POA: Insufficient documentation

## 2017-03-02 DIAGNOSIS — Q613 Polycystic kidney, unspecified: Secondary | ICD-10-CM | POA: Insufficient documentation

## 2017-03-02 DIAGNOSIS — R590 Localized enlarged lymph nodes: Secondary | ICD-10-CM | POA: Insufficient documentation

## 2017-03-02 DIAGNOSIS — K5730 Diverticulosis of large intestine without perforation or abscess without bleeding: Secondary | ICD-10-CM | POA: Insufficient documentation

## 2017-03-02 DIAGNOSIS — I714 Abdominal aortic aneurysm, without rupture: Secondary | ICD-10-CM | POA: Diagnosis not present

## 2017-03-02 DIAGNOSIS — I700 Atherosclerosis of aorta: Secondary | ICD-10-CM | POA: Insufficient documentation

## 2017-03-29 ENCOUNTER — Inpatient Hospital Stay: Payer: Medicare Other | Attending: Oncology

## 2017-03-29 ENCOUNTER — Inpatient Hospital Stay (HOSPITAL_BASED_OUTPATIENT_CLINIC_OR_DEPARTMENT_OTHER): Payer: Medicare Other | Admitting: Oncology

## 2017-03-29 VITALS — BP 153/78 | HR 61 | Temp 97.0°F | Resp 20 | Wt 248.5 lb

## 2017-03-29 DIAGNOSIS — E119 Type 2 diabetes mellitus without complications: Secondary | ICD-10-CM | POA: Insufficient documentation

## 2017-03-29 DIAGNOSIS — K5730 Diverticulosis of large intestine without perforation or abscess without bleeding: Secondary | ICD-10-CM | POA: Diagnosis not present

## 2017-03-29 DIAGNOSIS — Z801 Family history of malignant neoplasm of trachea, bronchus and lung: Secondary | ICD-10-CM | POA: Insufficient documentation

## 2017-03-29 DIAGNOSIS — C9110 Chronic lymphocytic leukemia of B-cell type not having achieved remission: Secondary | ICD-10-CM

## 2017-03-29 DIAGNOSIS — Z79899 Other long term (current) drug therapy: Secondary | ICD-10-CM | POA: Diagnosis not present

## 2017-03-29 DIAGNOSIS — Z87891 Personal history of nicotine dependence: Secondary | ICD-10-CM | POA: Insufficient documentation

## 2017-03-29 DIAGNOSIS — I700 Atherosclerosis of aorta: Secondary | ICD-10-CM | POA: Diagnosis not present

## 2017-03-29 DIAGNOSIS — R590 Localized enlarged lymph nodes: Secondary | ICD-10-CM | POA: Diagnosis not present

## 2017-03-29 DIAGNOSIS — Z7982 Long term (current) use of aspirin: Secondary | ICD-10-CM | POA: Insufficient documentation

## 2017-03-29 DIAGNOSIS — Q613 Polycystic kidney, unspecified: Secondary | ICD-10-CM | POA: Diagnosis not present

## 2017-03-29 DIAGNOSIS — C9190 Lymphoid leukemia, unspecified not having achieved remission: Secondary | ICD-10-CM | POA: Diagnosis not present

## 2017-03-29 DIAGNOSIS — Z8041 Family history of malignant neoplasm of ovary: Secondary | ICD-10-CM | POA: Insufficient documentation

## 2017-03-29 DIAGNOSIS — Z794 Long term (current) use of insulin: Secondary | ICD-10-CM

## 2017-03-29 DIAGNOSIS — N189 Chronic kidney disease, unspecified: Secondary | ICD-10-CM | POA: Diagnosis not present

## 2017-03-29 DIAGNOSIS — I714 Abdominal aortic aneurysm, without rupture: Secondary | ICD-10-CM

## 2017-03-29 DIAGNOSIS — D696 Thrombocytopenia, unspecified: Secondary | ICD-10-CM | POA: Insufficient documentation

## 2017-03-29 LAB — CBC WITH DIFFERENTIAL/PLATELET
BASOS PCT: 0 %
Basophils Absolute: 0 10*3/uL (ref 0–0.1)
Eosinophils Absolute: 0.2 10*3/uL (ref 0–0.7)
Eosinophils Relative: 1 %
HEMATOCRIT: 31.5 % — AB (ref 40.0–52.0)
HEMOGLOBIN: 10.7 g/dL — AB (ref 13.0–18.0)
LYMPHS ABS: 14.8 10*3/uL — AB (ref 1.0–3.6)
Lymphocytes Relative: 83 %
MCH: 34.1 pg — AB (ref 26.0–34.0)
MCHC: 34.1 g/dL (ref 32.0–36.0)
MCV: 100 fL (ref 80.0–100.0)
Monocytes Absolute: 0.3 10*3/uL (ref 0.2–1.0)
Monocytes Relative: 2 %
NEUTROS ABS: 2.4 10*3/uL (ref 1.4–6.5)
NEUTROS PCT: 14 %
Platelets: 74 10*3/uL — ABNORMAL LOW (ref 150–440)
RBC: 3.15 MIL/uL — AB (ref 4.40–5.90)
RDW: 14.8 % — ABNORMAL HIGH (ref 11.5–14.5)
WBC: 17.7 10*3/uL — ABNORMAL HIGH (ref 3.8–10.6)

## 2017-03-29 NOTE — Progress Notes (Signed)
[place]  [phone] Fax:(336) [phone]  ID: Patrick Payne OB: 25-Oct-1933  MR#: [id number]  HYQ#:[id number]  Patient Care Team: Idelle Crouch, MD as PCP - General (Internal Medicine)  CHIEF COMPLAINT: CLL  INTERVAL HISTORY: Patient returns to clinic for his routine yearly follow-up. He continues to feel well and remains asymptomatic. He denies any fevers, night sweats, or weight loss. He has noted no new lymphadenopathy. He has no neurologic complaints. He denies any chest pain or shortness of breath. He denies any nausea, vomiting, constipation, or diarrhea. He has no urinary complaints. Patient feels at his baseline and offers no specific complaints today.  REVIEW OF SYSTEMS:   Review of Systems  Constitutional: Negative.  Negative for diaphoresis, fever, malaise/fatigue and weight loss.  Respiratory: Negative.  Negative for cough and shortness of breath.   Cardiovascular: Negative.  Negative for chest pain and leg swelling.  Gastrointestinal: Negative.  Negative for abdominal pain, blood in stool and melena.  Genitourinary: Negative.   Skin: Negative.  Negative for rash.  Neurological: Negative.  Negative for weakness.  Psychiatric/Behavioral: Negative.  The patient is not nervous/anxious.     As per HPI. Otherwise, a complete review of systems is negative.  PAST MEDICAL HISTORY: Past Medical History:  Diagnosis Date  . Diabetes mellitus without complication ([place])     PAST SURGICAL HISTORY: Past Surgical History:  Procedure Laterality Date  . CARDIAC CATHETERIZATION  12/1987  . CHOLECYSTECTOMY      FAMILY HISTORY: Reported history of ovarian and lung cancer. Diabetes, hypertension.     ADVANCED DIRECTIVES:    HEALTH MAINTENANCE: Social History  Substance Use Topics  . Smoking status: Former Research scientist (life sciences)  . Smokeless tobacco: Never Used     Comment: quit in Feb of 1997  . Alcohol use No     Colonoscopy:  PAP:  Bone  density:  Lipid panel:  Allergies  Allergen Reactions  . Celecoxib Other (See Comments)    Increased Blood Pressure  . Chlorpromazine Nausea Only  . Meloxicam Other (See Comments)    Hypertension  . Prednisone Other (See Comments)    Diabetic  . Amoxicillin-Pot Clavulanate Rash  . Penicillin V Potassium Rash    Current Outpatient Prescriptions  Medication Sig Dispense Refill  . Aflibercept (EYLEA) 2 MG/0.05ML SOLN     . allopurinol (ZYLOPRIM) 100 MG tablet Take 100 mg by mouth daily.    Marland Kitchen aspirin 81 MG tablet Take 81 mg by mouth daily.    Marland Kitchen atenolol (TENORMIN) 50 MG tablet     . atorvastatin (LIPITOR) 10 MG tablet     . Cholecalciferol (VITAMIN D-3) 1000 UNITS CAPS Take 1,000 Units by mouth at bedtime.    . clonazePAM (KLONOPIN) 1 MG tablet     . Cyanocobalamin (RA VITAMIN B-12 TR) 1000 MCG TBCR Take by mouth.    . Difluprednate (DUREZOL) 0.05 % EMUL     . econazole nitrate 1 % cream Apply topically.    . fexofenadine (ALLEGRA) 180 MG tablet Take by mouth.    Marland Kitchen glipiZIDE (GLUCOTROL XL) 10 MG 24 hr tablet     . glucose blood test strip     . Insulin Glargine (LANTUS SOLOSTAR) 100 UNIT/ML Solostar Pen     . Insulin Pen Needle (BD PEN NEEDLE NANO U/F) 32G X 4 MM MISC     . Iodoquinol-HC (HYDROCORTISONE-IODOQUINOL) 1-1 % CREA Apply topically 2 (two) times daily.  0  . ketorolac (ACULAR) 0.4 % SOLN     .  LORazepam (ATIVAN) 1 MG tablet     . Multiple Vitamins-Minerals (PRESERVISION AREDS 2 PO) Take 5 mg by mouth 2 (two) times daily.    Marland Kitchen omega-3 acid ethyl esters (LOVAZA) 1 g capsule     . pioglitazone (ACTOS) 45 MG tablet     . tamsulosin (FLOMAX) 0.4 MG CAPS capsule Take 0.4 mg by mouth daily.    Marland Kitchen telmisartan-hydrochlorothiazide (MICARDIS HCT) 80-25 MG tablet Take by mouth.    . triamcinolone (NASACORT) 55 MCG/ACT AERO nasal inhaler Place into the nose.    . vitamin B-12 (CYANOCOBALAMIN) 500 MCG tablet Take 500 mcg by mouth daily.     No current facility-administered  medications for this visit.     OBJECTIVE: Vitals:   03/29/17 1524  BP: (!) 153/78  Pulse: 61  Resp: 20  Temp: 97 F (36.1 C)     Body mass index is 38.92 kg/m.    ECOG FS:0 - Asymptomatic  General: Well-developed, well-nourished, no acute distress. Eyes: Pink conjunctiva, anicteric sclera. Lungs: Clear to auscultation bilaterally. Heart: Regular rate and rhythm. No rubs, murmurs, or gallops. Abdomen: Soft, nontender, nondistended. No organomegaly noted, normoactive bowel sounds. Musculoskeletal: No edema, cyanosis, or clubbing. Neuro: Alert, answering all questions appropriately. Cranial nerves grossly intact. Skin: No rashes or petechiae noted. Psych: Normal affect.   LAB RESULTS:  Lab Results  Component Value Date   NA 138 03/29/2016   K 4.2 03/29/2016   CL 108 03/29/2016   CO2 25 03/29/2016   GLUCOSE 111 (H) 03/29/2016   BUN 35 (H) 03/29/2016   CREATININE 1.70 (H) 03/29/2016   CALCIUM 8.1 (L) 03/29/2016   PROT 6.5 03/29/2016   ALBUMIN 3.9 03/29/2016   AST 19 03/29/2016   ALT 20 03/29/2016   ALKPHOS 92 03/29/2016   BILITOT 0.7 03/29/2016   GFRNONAA 37 (L) 03/29/2016   GFRAA 42 (L) 03/29/2016    Lab Results  Component Value Date   WBC 17.7 (H) 03/29/2017   NEUTROABS 2.4 03/29/2017   HGB 10.7 (L) 03/29/2017   HCT 31.5 (L) 03/29/2017   MCV 100.0 03/29/2017   PLT 74 (L) 03/29/2017     STUDIES: Ct Abdomen Pelvis Wo Contrast  Result Date: 03/03/2017 CLINICAL DATA:  83 year old male with history of painless gross hematuria last week. No current abdominal pain. No history of trauma. EXAM: CT ABDOMEN AND PELVIS WITHOUT CONTRAST TECHNIQUE: Multidetector CT imaging of the abdomen and pelvis was performed following the standard protocol without IV contrast. COMPARISON:  CT the abdomen and pelvis 04/12/2011. FINDINGS: Lower chest: Atherosclerotic calcifications in the right coronary artery. Hepatobiliary: Mild diffuse low attenuation throughout the hepatic  parenchyma, indicative of hepatic steatosis. Subcentimeter low-attenuation lesion in segment 7 of the liver is incompletely characterized on today's noncontrast CT examination, but is statistically likely a tiny cyst. No other larger more suspicious appearing cystic or solid hepatic lesions are identified on today's noncontrast CT examination. Gallbladder is not visualized and is presumably surgically absent (no surgical clips are noted in the gallbladder fossa). Pancreas: No definite pancreatic mass or peripancreatic inflammatory changes are noted on today's noncontrast CT examination. Spleen: Unremarkable. Adrenals/Urinary Tract: No definite calcifications are identified within the collecting system of either kidney, along the course of either ureter, or within the lumen of the urinary bladder. There is no hydroureteronephrosis to indicate urinary tract obstruction at this time. Innumerable predominantly low-attenuation lesions are noted throughout the right kidney, incompletely characterized on today's noncontrast examination, most compatible with innumerable cysts of varying degrees of  complexity, some of which have faint calcifications associated with the cyst walls. In the left kidney in the lateral aspect of the lower pole there is an exophytic 1.6 cm low-attenuation lesion which is incompletely characterized, but likely a cyst. Unenhanced appearance of the urinary bladder is normal. Bilateral adrenal glands are normal in appearance. Stomach/Bowel: Unenhanced appearance of the stomach is normal. There is no pathologic dilatation of small bowel or colon. Numerous colonic diverticulae are noted, without surrounding inflammatory changes to suggest an acute diverticulitis at this time. Normal appendix. Vascular/Lymphatic: Aortic atherosclerosis, with fusiform aneurysmal dilatation of the infrarenal abdominal aorta which measures up to 3.8 x 3.9 cm. Bilateral pelvic lymphadenopathy most evident in the external  iliac distribution, with lymph nodes measuring up to 22 mm in short axis on the right side (axial image 75 of series 2). Mildly enlarged retrocaval lymph node (image 39 of series 2) measuring 11 mm. No other lymphadenopathy noted elsewhere in the abdomen or pelvis on today's noncontrast CT examination. Reproductive: Prostate gland and seminal vesicles are unremarkable in appearance. Other: No significant volume of ascites.  No pneumoperitoneum. Musculoskeletal: There are no aggressive appearing lytic or blastic lesions noted in the visualized portions of the skeleton. IMPRESSION: 1. No urinary tract calculi or findings of urinary tract obstruction are noted at this time. 2. Right-sided polycystic kidney redemonstrated. This is incompletely evaluated on today's noncontrast CT examination, but this kidney contains innumerable cysts of varying degrees of complexity and is likely the source of hematuria. 3. Multiple enlarged pelvic and retroperitoneal lymph nodes, as above. In retrospect, these findings are only slightly increased compared to remote prior study 2012, and may be chronic and reactive, however, clinical correlation to exclude signs are symptoms of underlying lymphoproliferative disorder is suggested. 4. Aortic atherosclerosis, in addition to at least right coronary artery disease. 5. In addition, there is fusiform aneurysmal dilatation of the infrarenal abdominal aorta which measures up to 3.9 x 3.8 cm. 6. Colonic diverticulosis without evidence of acute diverticulitis at this time. 7. Additional incidental findings, as above. Electronically Signed   By: Vinnie Langton M.D.   On: 03/03/2017 08:57    ASSESSMENT: Rai stage I CLL.  PLAN:    1. CLL: Patient's white blood cell count remains elevated, but relatively stable and unchanged for at least 10 years. No treatment is required. Continue simple observation. Patient has his blood checked 1 or 2 times per year by primary care. Return to clinic in 1  year for repeat laboratory work and further evaluation. 2. Thrombocytopenia: Although patient's platelet count is decreased at 74, it is unchanged for at least 10 years as well. No intervention is needed at this time. Monitor. 3. Chronic renal insufficiency: Patient's creatinine appears to be at his baseline, monitor.  4. Lymphadenopathy: CT scan results reviewed independently and reported as above with enlarged pelvic and retroperitoneal lymph nodes. These are also noted on a scan in 2012 appear to be slightly larger. No intervention is needed. These are likely secondary to patient's underlying CLL. Return to clinic in 1 year as above.  Patient expressed understanding and was in agreement with this plan. He also understands that He can call clinic at any time with any questions, concerns, or complaints.   Lloyd Huger, MD   04/01/2017 7:52 AM

## 2017-03-29 NOTE — Progress Notes (Signed)
Patient denies any concerns today.  

## 2017-04-09 ENCOUNTER — Encounter: Payer: Self-pay | Admitting: Emergency Medicine

## 2017-04-09 ENCOUNTER — Emergency Department: Payer: Medicare Other

## 2017-04-09 ENCOUNTER — Inpatient Hospital Stay
Admission: EM | Admit: 2017-04-09 | Discharge: 2017-04-18 | DRG: 683 | Disposition: A | Discharge status: Home Health | Payer: Medicare Other | Attending: Internal Medicine | Admitting: Internal Medicine

## 2017-04-09 DIAGNOSIS — N189 Chronic kidney disease, unspecified: Secondary | ICD-10-CM | POA: Diagnosis not present

## 2017-04-09 DIAGNOSIS — I10 Essential (primary) hypertension: Secondary | ICD-10-CM | POA: Diagnosis not present

## 2017-04-09 DIAGNOSIS — E785 Hyperlipidemia, unspecified: Secondary | ICD-10-CM | POA: Diagnosis present

## 2017-04-09 DIAGNOSIS — G4733 Obstructive sleep apnea (adult) (pediatric): Secondary | ICD-10-CM | POA: Diagnosis present

## 2017-04-09 DIAGNOSIS — N139 Obstructive and reflux uropathy, unspecified: Secondary | ICD-10-CM | POA: Diagnosis present

## 2017-04-09 DIAGNOSIS — N136 Pyonephrosis: Secondary | ICD-10-CM | POA: Diagnosis not present

## 2017-04-09 DIAGNOSIS — Z87891 Personal history of nicotine dependence: Secondary | ICD-10-CM

## 2017-04-09 DIAGNOSIS — N1330 Unspecified hydronephrosis: Secondary | ICD-10-CM | POA: Diagnosis not present

## 2017-04-09 DIAGNOSIS — R110 Nausea: Secondary | ICD-10-CM | POA: Diagnosis not present

## 2017-04-09 DIAGNOSIS — I714 Abdominal aortic aneurysm, without rupture: Secondary | ICD-10-CM | POA: Diagnosis present

## 2017-04-09 DIAGNOSIS — C9110 Chronic lymphocytic leukemia of B-cell type not having achieved remission: Secondary | ICD-10-CM | POA: Diagnosis present

## 2017-04-09 DIAGNOSIS — R1010 Upper abdominal pain, unspecified: Secondary | ICD-10-CM | POA: Diagnosis not present

## 2017-04-09 DIAGNOSIS — R310 Gross hematuria: Secondary | ICD-10-CM | POA: Diagnosis present

## 2017-04-09 DIAGNOSIS — N179 Acute kidney failure, unspecified: Secondary | ICD-10-CM | POA: Diagnosis present

## 2017-04-09 DIAGNOSIS — Z880 Allergy status to penicillin: Secondary | ICD-10-CM

## 2017-04-09 DIAGNOSIS — R109 Unspecified abdominal pain: Secondary | ICD-10-CM | POA: Diagnosis present

## 2017-04-09 DIAGNOSIS — Z7982 Long term (current) use of aspirin: Secondary | ICD-10-CM

## 2017-04-09 DIAGNOSIS — E119 Type 2 diabetes mellitus without complications: Secondary | ICD-10-CM | POA: Diagnosis not present

## 2017-04-09 DIAGNOSIS — Z87442 Personal history of urinary calculi: Secondary | ICD-10-CM

## 2017-04-09 DIAGNOSIS — N401 Enlarged prostate with lower urinary tract symptoms: Secondary | ICD-10-CM | POA: Diagnosis present

## 2017-04-09 DIAGNOSIS — N170 Acute kidney failure with tubular necrosis: Principal | ICD-10-CM | POA: Diagnosis present

## 2017-04-09 DIAGNOSIS — N183 Chronic kidney disease, stage 3 (moderate): Secondary | ICD-10-CM | POA: Diagnosis present

## 2017-04-09 DIAGNOSIS — K5732 Diverticulitis of large intestine without perforation or abscess without bleeding: Secondary | ICD-10-CM | POA: Diagnosis present

## 2017-04-09 DIAGNOSIS — I129 Hypertensive chronic kidney disease with stage 1 through stage 4 chronic kidney disease, or unspecified chronic kidney disease: Secondary | ICD-10-CM | POA: Diagnosis present

## 2017-04-09 DIAGNOSIS — Z794 Long term (current) use of insulin: Secondary | ICD-10-CM | POA: Diagnosis not present

## 2017-04-09 DIAGNOSIS — R34 Anuria and oliguria: Secondary | ICD-10-CM | POA: Diagnosis not present

## 2017-04-09 DIAGNOSIS — E1122 Type 2 diabetes mellitus with diabetic chronic kidney disease: Secondary | ICD-10-CM | POA: Diagnosis present

## 2017-04-09 DIAGNOSIS — K5792 Diverticulitis of intestine, part unspecified, without perforation or abscess without bleeding: Secondary | ICD-10-CM

## 2017-04-09 DIAGNOSIS — D696 Thrombocytopenia, unspecified: Secondary | ICD-10-CM | POA: Diagnosis present

## 2017-04-09 DIAGNOSIS — Q613 Polycystic kidney, unspecified: Secondary | ICD-10-CM | POA: Diagnosis not present

## 2017-04-09 DIAGNOSIS — E860 Dehydration: Secondary | ICD-10-CM | POA: Diagnosis present

## 2017-04-09 HISTORY — DX: Diverticulitis of intestine, part unspecified, without perforation or abscess without bleeding: K57.92

## 2017-04-09 HISTORY — DX: Hyperlipidemia, unspecified: E78.5

## 2017-04-09 HISTORY — DX: Chronic kidney disease, stage 3 (moderate): N18.3

## 2017-04-09 HISTORY — DX: Essential (primary) hypertension: I10

## 2017-04-09 HISTORY — DX: Chronic kidney disease, stage 3 unspecified: N18.30

## 2017-04-09 HISTORY — DX: Polycystic kidney, unspecified: Q61.3

## 2017-04-09 LAB — COMPREHENSIVE METABOLIC PANEL
ALBUMIN: 3.7 g/dL (ref 3.5–5.0)
ALT: 19 U/L (ref 17–63)
AST: 22 U/L (ref 15–41)
Alkaline Phosphatase: 76 U/L (ref 38–126)
Anion gap: 6 (ref 5–15)
BUN: 45 mg/dL — AB (ref 6–20)
CHLORIDE: 104 mmol/L (ref 101–111)
CO2: 24 mmol/L (ref 22–32)
CREATININE: 4.02 mg/dL — AB (ref 0.61–1.24)
Calcium: 8.7 mg/dL — ABNORMAL LOW (ref 8.9–10.3)
GFR calc Af Amer: 15 mL/min — ABNORMAL LOW (ref >60)
GFR calc non Af Amer: 13 mL/min — ABNORMAL LOW (ref >60)
Glucose, Bld: 165 mg/dL — ABNORMAL HIGH (ref 65–99)
Potassium: 4.5 mmol/L (ref 3.5–5.1)
SODIUM: 134 mmol/L — AB (ref 135–145)
Total Bilirubin: 0.9 mg/dL (ref 0.3–1.2)
Total Protein: 6.1 g/dL — ABNORMAL LOW (ref 6.5–8.1)

## 2017-04-09 LAB — CBC
HCT: 33.3 % — ABNORMAL LOW (ref 40.0–52.0)
Hemoglobin: 11 g/dL — ABNORMAL LOW (ref 13.0–18.0)
MCH: 33.1 pg (ref 26.0–34.0)
MCHC: 33 g/dL (ref 32.0–36.0)
MCV: 100.2 fL — AB (ref 80.0–100.0)
PLATELETS: 74 10*3/uL — AB (ref 150–440)
RBC: 3.32 MIL/uL — AB (ref 4.40–5.90)
RDW: 15 % — AB (ref 11.5–14.5)
WBC: 24.8 10*3/uL — ABNORMAL HIGH (ref 3.8–10.6)

## 2017-04-09 LAB — URINALYSIS, COMPLETE (UACMP) WITH MICROSCOPIC
Bacteria, UA: NONE SEEN
Bilirubin Urine: NEGATIVE
GLUCOSE, UA: NEGATIVE mg/dL
Ketones, ur: NEGATIVE mg/dL
Nitrite: NEGATIVE
PROTEIN: 30 mg/dL — AB
SPECIFIC GRAVITY, URINE: 1.011 (ref 1.005–1.030)
pH: 5 (ref 5.0–8.0)

## 2017-04-09 LAB — LIPASE, BLOOD: LIPASE: 22 U/L (ref 11–51)

## 2017-04-09 LAB — GLUCOSE, CAPILLARY: GLUCOSE-CAPILLARY: 143 mg/dL — AB (ref 65–99)

## 2017-04-09 MED ORDER — CLONAZEPAM 1 MG PO TABS
1.0000 mg | ORAL_TABLET | Freq: Every day | ORAL | Status: DC
  Administered 2017-04-09 – 2017-04-17 (×9): 1 mg via ORAL
  Filled 2017-04-09 (×9): qty 1

## 2017-04-09 MED ORDER — HEPARIN SODIUM (PORCINE) 5000 UNIT/ML IJ SOLN
5000.0000 [IU] | Freq: Three times a day (TID) | INTRAMUSCULAR | Status: DC
  Administered 2017-04-12 – 2017-04-13 (×2): 5000 [IU] via SUBCUTANEOUS
  Filled 2017-04-09 (×2): qty 1

## 2017-04-09 MED ORDER — ATORVASTATIN CALCIUM 10 MG PO TABS
10.0000 mg | ORAL_TABLET | Freq: Every day | ORAL | Status: DC
  Administered 2017-04-10 – 2017-04-17 (×7): 10 mg via ORAL
  Filled 2017-04-09 (×7): qty 1

## 2017-04-09 MED ORDER — INSULIN ASPART 100 UNIT/ML ~~LOC~~ SOLN
0.0000 [IU] | Freq: Every day | SUBCUTANEOUS | Status: DC
Start: 2017-04-09 — End: 2017-04-18

## 2017-04-09 MED ORDER — ACETAMINOPHEN 650 MG RE SUPP: 650.0000 mg | Freq: Four times a day (QID) | RECTAL | Status: DC | PRN

## 2017-04-09 MED ORDER — SODIUM CHLORIDE 0.9 % IV SOLN
Freq: Once | INTRAVENOUS | Status: AC
  Administered 2017-04-09: 18:00:00 via INTRAVENOUS

## 2017-04-09 MED ORDER — CIPROFLOXACIN IN D5W 400 MG/200ML IV SOLN
400.0000 mg | Freq: Once | INTRAVENOUS | Status: AC
  Administered 2017-04-09: 400 mg via INTRAVENOUS
  Filled 2017-04-09: qty 200

## 2017-04-09 MED ORDER — ATENOLOL 50 MG PO TABS
50.0000 mg | ORAL_TABLET | Freq: Every day | ORAL | Status: DC
  Administered 2017-04-10 – 2017-04-18 (×8): 50 mg via ORAL
  Filled 2017-04-09 (×9): qty 1

## 2017-04-09 MED ORDER — IOPAMIDOL (ISOVUE-300) INJECTION 61%
15.0000 mL | INTRAVENOUS | Status: DC
  Administered 2017-04-09: 15 mL via ORAL

## 2017-04-09 MED ORDER — TAMSULOSIN HCL 0.4 MG PO CAPS
0.4000 mg | ORAL_CAPSULE | Freq: Every day | ORAL | Status: DC
  Administered 2017-04-10 – 2017-04-18 (×8): 0.4 mg via ORAL
  Filled 2017-04-09 (×8): qty 1

## 2017-04-09 MED ORDER — ACETAMINOPHEN 325 MG PO TABS: 650.0000 mg | ORAL_TABLET | Freq: Four times a day (QID) | ORAL | Status: DC | PRN

## 2017-04-09 MED ORDER — CIPROFLOXACIN HCL 500 MG PO TABS
500.0000 mg | ORAL_TABLET | ORAL | Status: DC
  Administered 2017-04-10 – 2017-04-13 (×3): 500 mg via ORAL
  Filled 2017-04-09 (×3): qty 1

## 2017-04-09 MED ORDER — METRONIDAZOLE IN NACL 5-0.79 MG/ML-% IV SOLN
500.0000 mg | Freq: Once | INTRAVENOUS | Status: AC
  Administered 2017-04-09: 500 mg via INTRAVENOUS
  Filled 2017-04-09: qty 100

## 2017-04-09 MED ORDER — ONDANSETRON HCL 4 MG PO TABS: 4.0000 mg | ORAL_TABLET | Freq: Four times a day (QID) | ORAL | Status: DC | PRN

## 2017-04-09 MED ORDER — INSULIN ASPART 100 UNIT/ML ~~LOC~~ SOLN
0.0000 [IU] | Freq: Three times a day (TID) | SUBCUTANEOUS | Status: DC
  Administered 2017-04-10 (×2): 1 [IU] via SUBCUTANEOUS
  Administered 2017-04-10 – 2017-04-12 (×4): 2 [IU] via SUBCUTANEOUS
  Administered 2017-04-12 – 2017-04-14 (×5): 1 [IU] via SUBCUTANEOUS
  Administered 2017-04-14 – 2017-04-15 (×2): 2 [IU] via SUBCUTANEOUS
  Administered 2017-04-15: 1 [IU] via SUBCUTANEOUS
  Administered 2017-04-16: 2 [IU] via SUBCUTANEOUS
  Administered 2017-04-17: 1 [IU] via SUBCUTANEOUS
  Administered 2017-04-17: 2 [IU] via SUBCUTANEOUS
  Administered 2017-04-18 (×2): 1 [IU] via SUBCUTANEOUS
  Filled 2017-04-09 (×19): qty 1

## 2017-04-09 MED ORDER — LORAZEPAM 1 MG PO TABS
1.0000 mg | ORAL_TABLET | Freq: Three times a day (TID) | ORAL | Status: DC | PRN
  Filled 2017-04-09: qty 1

## 2017-04-09 MED ORDER — ONDANSETRON HCL 4 MG/2ML IJ SOLN
4.0000 mg | Freq: Four times a day (QID) | INTRAMUSCULAR | Status: DC | PRN
  Administered 2017-04-11 – 2017-04-12 (×3): 4 mg via INTRAVENOUS
  Filled 2017-04-09 (×3): qty 2

## 2017-04-09 MED ORDER — SODIUM CHLORIDE 0.9 % IV SOLN
INTRAVENOUS | Status: AC
  Administered 2017-04-09 – 2017-04-10 (×2): via INTRAVENOUS

## 2017-04-09 MED ORDER — ASPIRIN 81 MG PO CHEW
81.0000 mg | CHEWABLE_TABLET | Freq: Every day | ORAL | Status: DC
  Administered 2017-04-10 – 2017-04-18 (×8): 81 mg via ORAL
  Filled 2017-04-09 (×9): qty 1

## 2017-04-09 MED ORDER — METRONIDAZOLE 500 MG PO TABS
500.0000 mg | ORAL_TABLET | Freq: Three times a day (TID) | ORAL | Status: DC
  Administered 2017-04-09 – 2017-04-14 (×14): 500 mg via ORAL
  Filled 2017-04-09 (×16): qty 1

## 2017-04-09 MED ORDER — OXYCODONE HCL 5 MG PO TABS
5.0000 mg | ORAL_TABLET | ORAL | Status: DC | PRN
  Administered 2017-04-09 – 2017-04-10 (×3): 5 mg via ORAL
  Filled 2017-04-09 (×3): qty 1

## 2017-04-09 NOTE — Progress Notes (Signed)
Pharmacy Antibiotic Note  Patrick Payne is a 83 y.o. male admitted on 04/09/2017 with intra-abdominal infection.  Pharmacy has been consulted for ciprofloxacin dosing.  Plan: Cipro 500 mg PO q 24 hours ordered.   Height: 5\' 7"  (170.2 cm) Weight: 245 lb (111.1 kg) IBW/kg (Calculated) : 66.1  Temp (24hrs), Avg:98.4 F (36.9 C), Min:98 F (36.7 C), Max:98.8 F (37.1 C)   Recent Labs Lab 04/09/17 1446  WBC 24.8*  CREATININE 4.02*    Estimated Creatinine Clearance: 17.4 mL/min (A) (by C-G formula based on SCr of 4.02 mg/dL (H)).    Allergies  Allergen Reactions  . Celecoxib Other (See Comments)    Increased Blood Pressure  . Chlorpromazine Nausea Only  . Meloxicam Other (See Comments)    Hypertension  . Prednisone Other (See Comments)    Diabetic  . Amoxicillin-Pot Clavulanate Rash  . Penicillin V Potassium Rash    Antimicrobials this admission: Ciprofloxacin metronidazole  7/1 >>    >>   Dose adjustments this admission:   Microbiology results: 7/1 UCx: pending       7/1 UA: LE(tr)  NO2(-) WBC 0-5 Thank you for allowing pharmacy to be a part of this patient's care.  Elianah Karis S 04/09/2017 11:54 PM

## 2017-04-09 NOTE — ED Provider Notes (Addendum)
[facility] Emergency Department Provider Note       Time seen: ----------------------------------------- 5:14 PM on 04/09/2017 -----------------------------------------     I have reviewed the triage vital signs and the nursing notes.   HISTORY   Chief Complaint Abdominal Pain    HPI Patrick Payne is a 83 y.o. male who presents to the ED for a possible flareup of his diverticulitis. Patient states he went to [facility] to receive antibiotics for his diverticulitis but he was sent here for further evaluation. Patient describes poor by mouth intake as well as diarrhea. He is having left lower quadrant pain with nausea. Patient states it feels the same way it usually does. Pain is currently 2 out of 10.   Past Medical History:  Diagnosis Date  . Diabetes mellitus without complication [facility])     Patient Active Problem List   Diagnosis Date Noted  . Long term current use of insulin ([place]) 07/16/2014  . Abdominal aortic aneurysm (AAA) ([place]) 04/17/2014  . Chronic kidney disease (CKD), stage III (moderate) 04/17/2014  . Chronic lymphocytic leukemia ([place]) 04/17/2014  . Diabetes mellitus ([place]) 04/17/2014  . BP (high blood pressure) 04/17/2014  . HLD (hyperlipidemia) 04/17/2014  . Obstructive apnea 04/17/2014  . H/O malignant neoplasm of skin 06/07/2013    Past Surgical History:  Procedure Laterality Date  . CARDIAC CATHETERIZATION  12/1987  . CHOLECYSTECTOMY      Allergies Celecoxib; Chlorpromazine; Meloxicam; Prednisone; Amoxicillin-pot clavulanate; and Penicillin v potassium  Social History Social History  Substance Use Topics  . Smoking status: Former Research scientist (life sciences)  . Smokeless tobacco: Never Used     Comment: quit in Feb of 1997  . Alcohol use No    Review of Systems Constitutional: Negative for fever. Eyes: Negative for vision changes ENT:  Negative for congestion, sore throat Cardiovascular: Negative for chest  pain. Respiratory: Negative for shortness of breath. Gastrointestinal: Positive for abdominal pain, recent diarrhea Genitourinary: Positive for recent decreasing urine output Musculoskeletal: Negative for back pain. Skin: Negative for rash. Neurological: Negative for headaches, focal weakness or numbness.  All systems negative/normal/unremarkable except as stated in the HPI  ____________________________________________   PHYSICAL EXAM:  VITAL SIGNS: ED Triage Vitals  Enc Vitals Group     BP 04/09/17 1429 (!) 109/54     Pulse Rate 04/09/17 1429 62     Resp 04/09/17 1429 18     Temp 04/09/17 1429 98 F (36.7 C)     Temp Source 04/09/17 1429 Oral     SpO2 04/09/17 1429 98 %     Weight 04/09/17 1427 245 lb (111.1 kg)     Height 04/09/17 1427 5\' 7"  (1.702 m)     Head Circumference --      Peak Flow --      Pain Score 04/09/17 1427 2     Pain Loc --      Pain Edu? --      Excl. in [place]? --     Constitutional: Alert and oriented. Well appearing and in no distress. Eyes: Conjunctivae are normal. Normal extraocular movements. ENT   Head: Normocephalic and atraumatic.   Nose: No congestion/rhinnorhea.   Mouth/Throat: Mucous membranes are moist.   Neck: No stridor. Cardiovascular: Normal rate, regular rhythm. No murmurs, rubs, or gallops. Respiratory: Normal respiratory effort without tachypnea nor retractions. Breath sounds are clear and equal bilaterally. No wheezes/rales/rhonchi. Gastrointestinal: Left lower quadrant tenderness, no rebound or guarding. Normal bowel sounds. Musculoskeletal: Nontender with normal range of  motion in extremities. No lower extremity tenderness nor edema. Neurologic:  Normal speech and language. No gross focal neurologic deficits are appreciated.  Skin:  Skin is warm, dry and intact. No rash noted. Psychiatric: Mood and affect are normal. Speech and behavior are normal.  ____________________________________________  ED  COURSE:  Pertinent labs & imaging results that were available during my care of the patient were reviewed by me and considered in my medical decision making (see chart for details). Patient presents for abdominal pain with possible diverticulitis, we will assess with labs and imaging as indicated.   Procedures ____________________________________________   LABS (pertinent positives/negatives)  Labs Reviewed  COMPREHENSIVE METABOLIC PANEL - Abnormal; Notable for the following:       Result Value   Sodium 134 (*)    Glucose, Bld 165 (*)    BUN 45 (*)    Creatinine, Ser 4.02 (*)    Calcium 8.7 (*)    Total Protein 6.1 (*)    GFR calc non Af Amer 13 (*)    GFR calc Af Amer 15 (*)    All other components within normal limits  CBC - Abnormal; Notable for the following:    WBC 24.8 (*)    RBC 3.32 (*)    Hemoglobin 11.0 (*)    HCT 33.3 (*)    MCV 100.2 (*)    RDW 15.0 (*)    Platelets 74 (*)    All other components within normal limits  URINALYSIS, COMPLETE (UACMP) WITH MICROSCOPIC - Abnormal; Notable for the following:    Color, Urine YELLOW (*)    APPearance HAZY (*)    Hgb urine dipstick MODERATE (*)    Protein, ur 30 (*)    Leukocytes, UA TRACE (*)    Squamous Epithelial / LPF 0-5 (*)    All other components within normal limits  LIPASE, BLOOD    RADIOLOGY Images were viewed by me  CT of abdomen and pelvis without contrast  IMPRESSION: 1. Multifocal colonic diverticulosis. Possible early uncomplicated acute diverticulitis in the distal descending colon. 2. Prominence of the left ureter and renal collecting system with increased perinephric edema, suspicious for urinary tract infection. No urolithiasis. 3. Polycystic disease of the right kidney, stable in noncontrast CT appearance. 4. Aortic atherosclerosis with infrarenal abdominal aortic aneurysm measuring 3.9 x 3.8 cm. Recommend followup by ultrasound in 2 years. This recommendation follows ACR consensus  guidelines: White Paper of the ACR Incidental Findings Committee II on Vascular Findings. J Am Coll Radiol 2013; 10:789-794. 5. Hepatic steatosis. 6. Stable enlarged pelvic and retroperitoneal nodes. ____________________________________________  FINAL ASSESSMENT AND PLAN  Acute renal failure, Mild diverticulitis, Possible recently passed kidney stone  Plan: Patient's labs and imaging were dictated above. Patient had presented for left-sided abdominal pain which she attributed to diverticulitis. He is found to be in acute renal failure. CT scan findings were as dictated above. We did start him on IV antibiotics to cover for diverticulitis. Indeterminate etiology for left-sided hydronephrosis, possibly recently passed kidney stone. He does describe a lot of the symptoms of atypical kidney stone. He will need to be observed to ensure improvement in his kidney function.   Earleen Newport, MD   Note: This note was generated in part or whole with voice recognition software. Voice recognition is usually quite accurate but there are transcription errors that can and very often do occur. I apologize for any typographical errors that were not detected and corrected.     Earleen Newport,  MD 04/09/17 2005    Earleen Newport, MD 04/09/17 2040

## 2017-04-09 NOTE — Consult Note (Signed)
Service Requesting Consultation: [facility]  Service Providing Consultation:  Urology, Dr. Janne Lab   SUBJECTIVE:  CC:  Left abdominal pain  HPI: Patrick Payne is a 83 y.o. old with a history of nephrolithiasis, right polycystic kidney, CLL, now presenting with left abdominal pain. He relays a 3 days history of left lower abdominal pain associated with decreased PO intake and nausea. Noted decreased urine output over the 3 days preceding presentation, but no urinary urgency or frequency.  Upon evaluation in the ED, was found to have an elevated creatinine to 4.1(baseline 1.5-1.7). WBC to 24k (baseline with CLL usually about 20) Ct scan notable for prominence of the left renal collecting system with no transition point and no visible stone, possible early colonic inflammation.  The patient relays a history of kidney stones, last about 11 years prior requiring intervention. He recently had a full hematuria evaluation with his urologist (Dr. Eliberto Ivory) with BPH as the only significant finding. He was started on finasteride and flomax. He does endorse some left flank soreness about one week ago. He has not seen any stones pass in his urine. He similarly denies any recent episodes of gross hematuria.  He was able to void in the ED with a PVR of 0.   Past Medical History:  Diagnosis Date  . CKD (chronic kidney disease), stage III   . Diabetes mellitus without complication ([place])   . HLD (hyperlipidemia)   . HTN (hypertension)   . PKD (polycystic kidney disease)   :  Past Surgical History:  Procedure Laterality Date  . CARDIAC CATHETERIZATION  12/1987  . CHOLECYSTECTOMY    :  No current facility-administered medications on file prior to encounter.    Current Outpatient Prescriptions on File Prior to Encounter  Medication Sig Dispense Refill  . Aflibercept (EYLEA) 2 MG/0.05ML SOLN Inject 1 Dose into the eye as directed. One injection into left eye every 4-6 weeks    . allopurinol  (ZYLOPRIM) 100 MG tablet Take 100 mg by mouth daily.    Marland Kitchen aspirin 81 MG tablet Take 81 mg by mouth at bedtime.     Marland Kitchen atenolol (TENORMIN) 50 MG tablet Take 50 mg by mouth daily.     Marland Kitchen atorvastatin (LIPITOR) 10 MG tablet Take 10 mg by mouth at bedtime.     . Cholecalciferol (VITAMIN D-3) 5000 units TABS Take 5,000 Units by mouth at bedtime.     . clonazePAM (KLONOPIN) 1 MG tablet Take 1 mg by mouth at bedtime.     . fexofenadine (ALLEGRA) 180 MG tablet Take 180 mg by mouth daily as needed for allergies.     . Insulin Glargine (LANTUS SOLOSTAR) 100 UNIT/ML Solostar Pen Inject 16 Units into the skin daily.     Marland Kitchen LORazepam (ATIVAN) 1 MG tablet Take 1 mg by mouth every 8 (eight) hours as needed for anxiety.     . Multiple Vitamins-Minerals (PRESERVISION AREDS 2 PO) Take 1 capsule by mouth 2 (two) times daily.     Marland Kitchen omega-3 acid ethyl esters (LOVAZA) 1 g capsule Take 2 g by mouth at bedtime.     . pioglitazone (ACTOS) 45 MG tablet Take 45 mg by mouth daily.     . tamsulosin (FLOMAX) 0.4 MG CAPS capsule Take 0.4 mg by mouth daily.    Marland Kitchen telmisartan-hydrochlorothiazide (MICARDIS HCT) 80-25 MG tablet Take 1 tablet by mouth daily.     Marland Kitchen triamcinolone (NASACORT) 55 MCG/ACT AERO nasal inhaler Place 2 sprays into the nose 2 (  two) times daily as needed (allergies).     :  Allergies  Allergen Reactions  . Celecoxib Other (See Comments)    Increased Blood Pressure  . Chlorpromazine Nausea Only  . Meloxicam Other (See Comments)    Hypertension  . Prednisone Other (See Comments)    Diabetic  . Amoxicillin-Pot Clavulanate Rash  . Penicillin V Potassium Rash  :  Social History: Patient is married   Social History  Substance Use Topics  . Smoking status: Former Research scientist (life sciences)  . Smokeless tobacco: Never Used     Comment: quit in Feb of 1997  . Alcohol use No  .   Denies tobacco, EtOH, and illicit drug use.  Family History  Problem Relation Age of Onset  . Cancer Mother   . Varicose Veins Mother   .  Cancer Father   :   OBJECTIVE:   Review of Systems  A 10 point review of systems was completed and is negative other than those items described in the HPI.   PHYSICAL EXAM:  Gen: Patient is pleasant and in NAD.  A+Ox4. BP (!) 132/56   Pulse 68   Temp 98 F (36.7 C) (Oral)   Resp (!) 28   Ht 5\' 7"  (1.702 m)   Wt 111.1 kg (245 lb)   SpO2 94%   BMI 38.37 kg/m  Mouth: Oropharynx nl. Neck: No JVD.  No thyroidmegaly. Card: RRR, palpable peripheral pulses Pulm: regular respiratory effort.  Back: no CVAT. Abdomen: BSx4.   No masses, mild LLQ tenderness.  No heptaosplenomegaly.    LABS: Lab Results  Component Value Date   CREATININE 4.02 (H) 04/09/2017   Lab Results  Component Value Date   WBC 24.8 (H) 04/09/2017   HGB 11.0 (L) 04/09/2017   HCT 33.3 (L) 04/09/2017   MCV 100.2 (H) 04/09/2017   PLT 74 (L) 04/09/2017     IMAGING: CT A/P 1. Multifocal colonic diverticulosis. Possible early uncomplicated acute diverticulitis in the distal descending colon. 2. Prominence of the left ureter and renal collecting system with increased perinephric edema, suspicious for urinary tract infection. No urolithiasis. 3. Polycystic disease of the right kidney, stable in noncontrast CT appearance. 4. Aortic atherosclerosis with infrarenal abdominal aortic aneurysm measuring 3.9 x 3.8 cm. Recommend followup by ultrasound in 2 years. This recommendation follows ACR consensus guidelines: White Paper of the ACR Incidental Findings Committee II on Vascular Findings. J Am Coll Radiol 2013; 10:789-794. 5. Hepatic steatosis. 6. Stable enlarged pelvic and retroperitoneal nodes.  ASSESSMENT:   83 y.o. old male with left sided mild hydronephrosis and AKI of unclear etiology.  Discussed with the patient and his family that the etiology of his left hydronephrosis and decline in renal function are unclear at this time. Differential includes partial ureteral obstruction due to colonic inflammation  from diverticulitis versus recently passed stone. As his hydronephrosis is mild, he is overall well appearing with minimal symptoms, and there is no clear etiology of obstruction on imaging, my recommendation is for supportive care and careful monitoring with repeat labs and repeat imaging as needed.   PLAN:  1.  Admit for observation 2.  IV hydration, empiric antibiotics to cover diverticulitis 3.  Recheck renal function, CBC with AM labs 4.  May consider placement of a ureteral stent if clinical picture shifts in favor of obstructive uropathy 5. Follow urine culture results

## 2017-04-09 NOTE — H&P (Signed)
[facility] NAME: Patrick Payne    MR#:  [id number]  DATE OF BIRTH:  1934/07/03   DATE OF ADMISSION:  04/09/2017  PRIMARY CARE PHYSICIAN: Idelle [facility], MD   REQUESTING/REFERRING PHYSICIAN: Jimmye Norman, MD  CHIEF COMPLAINT:   Chief Complaint  Patient presents with  . Abdominal Pain    HISTORY OF PRESENT ILLNESS:  Patrick Payne  is a 83 y.o. male who presents with Left lower quadrant abdominal pain. Patient states that more than a week ago he noticed some frank hematuria. This happened one time and did not recur. Several days ago he started having left lower quadrant abdominal pain. He did have some small amount of flank and back pain as well. He felt like this pain was likely due to diverticulitis, as he has had that before. He did notice some decreased urination. The pain also migrated from his left lower quadrant slowly towards his umbilicus. Evaluation here in the ED shows left perinephric edema, microscopic hematuria, and potentially some early diverticulitis. Hospitalists were called for admission  PAST MEDICAL HISTORY:   Past Medical History:  Diagnosis Date  . CKD (chronic kidney disease), stage III   . Diabetes mellitus without complication ([place])   . HLD (hyperlipidemia)   . HTN (hypertension)   . PKD (polycystic kidney disease)     PAST SURGICAL HISTORY:   Past Surgical History:  Procedure Laterality Date  . CARDIAC CATHETERIZATION  12/1987  . CHOLECYSTECTOMY      SOCIAL HISTORY:   Social History  Substance Use Topics  . Smoking status: Former Research scientist (life sciences)  . Smokeless tobacco: Never Used     Comment: quit in Feb of 1997  . Alcohol use No    FAMILY HISTORY:   Family History  Problem Relation Age of Onset  . Cancer Mother   . Varicose Veins Mother   . Cancer Father     DRUG ALLERGIES:   Allergies  Allergen Reactions  . Celecoxib Other (See Comments)    Increased Blood Pressure  .  Chlorpromazine Nausea Only  . Meloxicam Other (See Comments)    Hypertension  . Prednisone Other (See Comments)    Diabetic  . Amoxicillin-Pot Clavulanate Rash  . Penicillin V Potassium Rash    MEDICATIONS AT HOME:   Prior to Admission medications   Medication Sig Start Date End Date Taking? Authorizing Provider  Aflibercept Alfonse Flavors) 2 MG/0.05ML SOLN     [provider]  allopurinol (ZYLOPRIM) 100 MG tablet Take 100 mg by mouth daily.    [provider]  aspirin 81 MG tablet Take 81 mg by mouth daily.    [provider]  atenolol (TENORMIN) 50 MG tablet  09/02/15   [provider]  atorvastatin (LIPITOR) 10 MG tablet  09/02/15   [provider]  Cholecalciferol (VITAMIN D-3) 1000 UNITS CAPS Take 1,000 Units by mouth at bedtime.    [provider]  clonazePAM Bobbye Charleston) 1 MG tablet  09/06/15   [provider]  Cyanocobalamin (RA VITAMIN B-12 TR) 1000 MCG TBCR Take by mouth.    [provider]  Difluprednate (DUREZOL) 0.05 % EMUL  01/14/15   [provider]  econazole nitrate 1 % cream Apply topically. 04/11/13   [provider]  fexofenadine (ALLEGRA) 180 MG tablet Take by mouth.    [provider]  glipiZIDE (GLUCOTROL XL) 10 MG 24 hr tablet  11/10/15   [provider]  glucose blood test strip  10/07/15   [provider]  Insulin Glargine (LANTUS SOLOSTAR) 100 UNIT/ML Solostar Pen  07/24/15   [provider]  Insulin Pen Needle (BD PEN NEEDLE NANO U/F) 32G X 4 MM MISC  07/13/15   [provider]  Iodoquinol-HC (HYDROCORTISONE-IODOQUINOL) 1-1 % CREA Apply topically 2 (two) times daily. 12/21/15   [provider]  ketorolac (ACULAR) 0.4 % SOLN  01/14/15   [provider]  LORazepam (ATIVAN) 1 MG tablet  11/01/15   [provider]  Multiple Vitamins-Minerals (PRESERVISION AREDS 2 PO) Take 5 mg by mouth 2 (two) times daily.    [provider]  omega-3 acid ethyl esters (LOVAZA) 1 g capsule  02/08/15   [provider]  pioglitazone (ACTOS) 45 MG tablet  09/01/15   [provider]  tamsulosin (FLOMAX) 0.4 MG CAPS capsule Take 0.4 mg by mouth daily.    [provider]  telmisartan-hydrochlorothiazide (MICARDIS HCT) 80-25 MG tablet Take by mouth. 09/14/15   [provider]  triamcinolone (NASACORT) 55 MCG/ACT AERO nasal inhaler Place into the nose.    [provider]  vitamin B-12 (CYANOCOBALAMIN) 500 MCG tablet Take 500 mcg by mouth daily.    [provider]    REVIEW OF SYSTEMS:  Review of Systems  Constitutional: Negative for chills, fever, malaise/fatigue and weight loss.  HENT: Negative for ear pain, hearing loss and tinnitus.   Eyes: Negative for blurred vision, double vision, pain and redness.  Respiratory: Negative for cough, hemoptysis and shortness of breath.   Cardiovascular: Negative for chest pain, palpitations, orthopnea and leg swelling.  Gastrointestinal: Positive for abdominal pain and nausea. Negative for constipation, diarrhea and vomiting.  Genitourinary: Positive for hematuria. Negative for dysuria and frequency.       Hesitancy  Musculoskeletal: Negative for back pain, joint pain and neck pain.  Skin:       No acne, rash, or lesions  Neurological: Negative for dizziness, tremors, focal weakness and weakness.  Endo/Heme/Allergies: Negative for polydipsia. Does not bruise/bleed easily.  Psychiatric/Behavioral: Negative for depression. The patient is not nervous/anxious and does not have insomnia.      VITAL SIGNS:   Vitals:   04/09/17 1830 04/09/17 1900 04/09/17 1915 04/09/17 1946  BP: 127/71 (!) 144/66    Pulse: 65 62 64 68  Resp: (!) 21 19 17  (!) 26  Temp:      TempSrc:      SpO2: 100% 97% 100% 97%  Weight:      Height:       Wt Readings from Last 3 Encounters:  04/09/17 111.1 kg (245 lb)  03/29/17 112.7 kg (248 lb 8 oz)   08/26/16 112.5 kg (248 lb)    PHYSICAL EXAMINATION:  Physical Exam  Vitals reviewed. Constitutional: He is oriented to person, place, and time. He appears well-developed and well-nourished. No distress.  HENT:  Head: Normocephalic and atraumatic.  Dry mucous membranes  Eyes: Conjunctivae and EOM are normal. Pupils are equal, round, and reactive to light. No scleral icterus.  Neck: Normal range of motion. Neck supple. No JVD present. No thyromegaly present.  Cardiovascular: Normal rate, regular rhythm and intact distal pulses.  Exam reveals no gallop and no friction rub.   No murmur heard. Respiratory: Effort normal and breath sounds normal. No respiratory distress. He has no wheezes. He has no rales.  GI: Soft. Bowel sounds are normal. He exhibits no distension. There is tenderness (LLQ).  Musculoskeletal: Normal range  of motion. He exhibits no edema.  No arthritis, no gout  Lymphadenopathy:    He has no cervical adenopathy.  Neurological: He is alert and oriented to person, place, and time. No cranial nerve deficit.  No dysarthria, no aphasia  Skin: Skin is warm and dry. No rash noted. No erythema.  Psychiatric: He has a normal mood and affect. His behavior is normal. Judgment and thought content normal.    LABORATORY PANEL:   CBC  Recent Labs Lab 04/09/17 1446  WBC 24.8*  HGB 11.0*  HCT 33.3*  PLT 74*   ------------------------------------------------------------------------------------------------------------------  Chemistries   Recent Labs Lab 04/09/17 1446  NA 134*  K 4.5  CL 104  CO2 24  GLUCOSE 165*  BUN 45*  CREATININE 4.02*  CALCIUM 8.7*  AST 22  ALT 19  ALKPHOS 76  BILITOT 0.9   ------------------------------------------------------------------------------------------------------------------  Cardiac Enzymes No results for input(s): TROPONINI in the last 168  hours. ------------------------------------------------------------------------------------------------------------------  RADIOLOGY:  Ct Abdomen Pelvis Wo Contrast  Result Date: 04/09/2017 CLINICAL DATA:  Left lower quadrant pain.  Acute renal failure. EXAM: CT ABDOMEN AND PELVIS WITHOUT CONTRAST TECHNIQUE: Multidetector CT imaging of the abdomen and pelvis was performed following the standard protocol without IV contrast. COMPARISON:  CT 03/02/2017 FINDINGS: Lower chest: Linear atelectasis in the left lower lobe. No pleural effusion. Coronary artery calcifications are seen. Hepatobiliary: Decreased hepatic density consistent with steatosis. Small subcentimeter hypodensity in the right lobe is unchanged but incompletely characterized. Gallbladder is surgically absent, no biliary dilatation. Pancreas: Parenchymal atrophy. No ductal dilatation or inflammation. Spleen: Normal in size without focal abnormality. Adrenals/Urinary Tract: Normal adrenal glands. Enlargement and cystic replacement of the right kidney with innumerable cysts of varying sizes. Appearing density and some with thin calcifications. Overall appearance is unchanged from prior exam. The right ureter is nondilated. There is prominence of the left renal pelvis in ureter with left perinephric edema. No urolithiasis. Exophytic cyst from the lower left kidney is unchanged. Urinary bladder is completely decompressed. Stomach/Bowel: Stomach is physiologically distended without wall thickening. No small bowel dilatation, inflammation or obstruction. Normal appendix. Multifocal colonic diverticulosis throughout the entire colon. Possible early pericolonic inflammation and wall thickening at the distal descending colon. No perforation. No abscess. Vascular/Lymphatic: Aortic and branch atherosclerosis. Aneurysmal dilatation of the infrarenal aorta maximal dimension 3.9 cm, unchanged from prior exam. No periaortic soft tissue stranding to suggest rupture.  Enlarged right external iliac node measures 2.2 cm series 2, image 83, unchanged from prior exam. Additional prominent and enlarged pelvic and retroperitoneal nodes are also stable. No progressive lymphadenopathy. Reproductive: Normal sized prostate gland. Other: Fat within both inguinal canals. Small fat containing supraumbilical ventral abdominal wall hernia contains only fat. No free air, free fluid, or intra-abdominal fluid collection. Musculoskeletal: Again seen degenerative change in the spine. Probable hemangioma within L1 vertebral body. Chronic deformity of the right anterior iliac crest. IMPRESSION: 1. Multifocal colonic diverticulosis. Possible early uncomplicated acute diverticulitis in the distal descending colon. 2. Prominence of the left ureter and renal collecting system with increased perinephric edema, suspicious for urinary tract infection. No urolithiasis. 3. Polycystic disease of the right kidney, stable in noncontrast CT appearance. 4. Aortic atherosclerosis with infrarenal abdominal aortic aneurysm measuring 3.9 x 3.8 cm. Recommend followup by ultrasound in 2 years. This recommendation follows ACR consensus guidelines: White Paper of the ACR Incidental Findings Committee II on Vascular Findings. J Am Coll Radiol 2013; 10:789-794. 5. Hepatic steatosis. 6. Stable enlarged pelvic and retroperitoneal nodes. Electronically Signed  By: Jeb Levering M.D.   On: 04/09/2017 19:59    EKG:   Orders placed or performed in visit on 01/08/10  . EKG 12-Lead    IMPRESSION AND PLAN:  Principal Problem:   Acute on chronic renal failure (HCC) - patient has had very poor by mouth intake, and likely has increased renal failure due to very significant dehydration, versus whatever other possible renal etiology has going on. Possibility of kidney stone passage. Urology following. IV fluids for hydration, avoid nephrotoxins and monitor Active Problems:   Diverticulitis - seen on CT scan, antibiotics    Chronic lymphocytic leukemia (HCC) - white blood cell count seems to be near his baseline, monitor   Diabetes mellitus (HCC) - sliding scale insulin corresponding glucose checks   BP (high blood pressure) - continue home meds   HLD (hyperlipidemia) - continue home medications  All the records are reviewed and case discussed with ED provider. Management plans discussed with the patient and/or family.  DVT PROPHYLAXIS: SubQ heparin  GI PROPHYLAXIS: None  ADMISSION STATUS: Inpatient  CODE STATUS: Full Code Status History    This patient does not have a recorded code status. Please follow your organizational policy for patients in this situation.      TOTAL TIME TAKING CARE OF THIS PATIENT: 45 minutes.   Sandralee Tarkington [place] 04/09/2017, 8:53 PM  [facility]  Office  [phone]  CC: Primary care physician; Idelle [place], MD  Note:  This document was prepared using Dragon voice recognition software and may include unintentional dictation errors.

## 2017-04-09 NOTE — ED Notes (Signed)
Resumed care from [facility].  Pt alert.  Pt waiting on admission.  Family with pt.

## 2017-04-09 NOTE — ED Notes (Signed)
ED Provider at bedside. 

## 2017-04-09 NOTE — ED Triage Notes (Signed)
Patient states "I have diverticulitis and im having a flare up" Patient states it started Thursday but he wanted to attend his grand daughter's wedding yesterday. Presents now with left lower abdominal pain, nausea, constipation.  Patient states that he has a history of same and "it feels just like it always does"

## 2017-04-09 NOTE — ED Notes (Signed)
Report called to [place] rn

## 2017-04-09 NOTE — ED Notes (Signed)
Pt transported to room 211

## 2017-04-09 NOTE — ED Notes (Signed)
primedoc in with pt now for admission 

## 2017-04-09 NOTE — ED Notes (Signed)
Bladder scan approx 25-30cc urine.  md aware

## 2017-04-10 DIAGNOSIS — N179 Acute kidney failure, unspecified: Secondary | ICD-10-CM

## 2017-04-10 DIAGNOSIS — N136 Pyonephrosis: Secondary | ICD-10-CM

## 2017-04-10 DIAGNOSIS — Q613 Polycystic kidney, unspecified: Secondary | ICD-10-CM

## 2017-04-10 DIAGNOSIS — R110 Nausea: Secondary | ICD-10-CM

## 2017-04-10 DIAGNOSIS — R34 Anuria and oliguria: Secondary | ICD-10-CM

## 2017-04-10 LAB — CBC
HEMATOCRIT: 29 % — AB (ref 40.0–52.0)
Hemoglobin: 9.5 g/dL — ABNORMAL LOW (ref 13.0–18.0)
MCH: 32.8 pg (ref 26.0–34.0)
MCHC: 32.8 g/dL (ref 32.0–36.0)
MCV: 100 fL (ref 80.0–100.0)
PLATELETS: 57 10*3/uL — AB (ref 150–440)
RBC: 2.9 MIL/uL — ABNORMAL LOW (ref 4.40–5.90)
RDW: 15.1 % — AB (ref 11.5–14.5)
WBC: 13.3 10*3/uL — AB (ref 3.8–10.6)

## 2017-04-10 LAB — BASIC METABOLIC PANEL
Anion gap: 6 (ref 5–15)
BUN: 51 mg/dL — AB (ref 6–20)
CO2: 21 mmol/L — ABNORMAL LOW (ref 22–32)
Calcium: 7.5 mg/dL — ABNORMAL LOW (ref 8.9–10.3)
Chloride: 106 mmol/L (ref 101–111)
Creatinine, Ser: 4.89 mg/dL — ABNORMAL HIGH (ref 0.61–1.24)
GFR, EST AFRICAN AMERICAN: 12 mL/min — AB (ref >60)
GFR, EST NON AFRICAN AMERICAN: 10 mL/min — AB (ref >60)
Glucose, Bld: 127 mg/dL — ABNORMAL HIGH (ref 65–99)
POTASSIUM: 4.3 mmol/L (ref 3.5–5.1)
SODIUM: 133 mmol/L — AB (ref 135–145)

## 2017-04-10 LAB — GLUCOSE, CAPILLARY
GLUCOSE-CAPILLARY: 135 mg/dL — AB (ref 65–99)
GLUCOSE-CAPILLARY: 157 mg/dL — AB (ref 65–99)
GLUCOSE-CAPILLARY: 148 mg/dL — AB (ref 65–99)
Glucose-Capillary: 125 mg/dL — ABNORMAL HIGH (ref 65–99)

## 2017-04-10 MED ORDER — SODIUM CHLORIDE 0.9 % IV SOLN
INTRAVENOUS | Status: DC
  Administered 2017-04-10 – 2017-04-11 (×3): via INTRAVENOUS

## 2017-04-10 NOTE — Progress Notes (Signed)
Pt urinated 50 mL through the night. Bladder scanned pt and found 0 mL in bladder.  Pt denies feeling the need to void, no distention noted, and no pain reported. Called Dr. Marcille Blanco, to place orders.

## 2017-04-10 NOTE — Consult Note (Signed)
Urology Consult  Referring physician: Serita Grit Reason for referral: Renal failure  Chief Complaint: Renal failure  History of Present Illness: Patrick Payne admitted for diverticulitis and acute on chronic renal failure. Seen by Urology over the weekend. Known polycystic right kidney. Cr 4.1 noted. Decreased urine ouptut. Recent negative work up for blood in urine.   IMAGING: CT A/P 1. Multifocal colonic diverticulosis. Possible early uncomplicated acute diverticulitis in the distal descending colon. 2. Prominence of the left ureter and renal collecting system with increased perinephric edema, suspicious for urinary tract infection. No urolithiasis. 3. Polycystic disease of the right kidney, stable in noncontrast CT Appearance.  Watchful waiting recommended  Today Cr 4.89 Reviewed chart and CTscan Pain gone No CVA tenderness and not toxic   Past Medical History:  Diagnosis Date  . CKD (chronic kidney disease), stage III   . Diabetes mellitus without complication ([place])   . HLD (hyperlipidemia)   . HTN (hypertension)   . PKD (polycystic kidney disease)    Past Surgical History:  Procedure Laterality Date  . CARDIAC CATHETERIZATION  12/1987  . CHOLECYSTECTOMY      Medications: I have reviewed the patient's current medications. Allergies:  Allergies  Allergen Reactions  . Celecoxib Other (See Comments)    Increased Blood Pressure  . Chlorpromazine Nausea Only  . Meloxicam Other (See Comments)    Hypertension  . Prednisone Other (See Comments)    Diabetic  . Amoxicillin-Pot Clavulanate Rash  . Penicillin V Potassium Rash    Family History  Problem Relation Age of Onset  . Cancer Mother   . Varicose Veins Mother   . Cancer Father    Social History:  reports that he has quit smoking. He has never used smokeless tobacco. He reports that he does not drink alcohol or use drugs.  ROS: All systems are reviewed and negative except as noted. Rest negative  Physical  Exam:  Vital signs in last 24 hours: Temp:  [97.8 F (36.6 C)-99.7 F (37.6 C)] 97.8 F (36.6 C) (07/02 0823) Pulse Rate:  [62-71] 62 (07/02 0823) Resp:  [17-28] 18 (07/02 0823) BP: (109-144)/(47-71) 126/61 (07/02 0823) SpO2:  [92 %-100 %] 95 % (07/02 0823) Weight:  [245 lb (111.1 kg)] 245 lb (111.1 kg) (07/01 1427)    Laboratory Data:  Results for orders placed or performed during the hospital encounter of 04/09/17 (from the past 72 hour(s))  Urinalysis, Complete w Microscopic     Status: Abnormal   Collection Time: 04/09/17  2:29 PM  Result Value Ref Range   Color, Urine YELLOW (A) YELLOW   APPearance HAZY (A) CLEAR   Specific Gravity, Urine 1.011 1.005 - 1.030   pH 5.0 5.0 - 8.0   Glucose, UA NEGATIVE NEGATIVE mg/dL   Hgb urine dipstick MODERATE (A) NEGATIVE   Bilirubin Urine NEGATIVE NEGATIVE   Ketones, ur NEGATIVE NEGATIVE mg/dL   Protein, ur 30 (A) NEGATIVE mg/dL   Nitrite NEGATIVE NEGATIVE   Leukocytes, UA TRACE (A) NEGATIVE   RBC / HPF TOO NUMEROUS TO COUNT 0 - 5 RBC/hpf   WBC, UA 0-5 0 - 5 WBC/hpf   Bacteria, UA NONE SEEN NONE SEEN   Squamous Epithelial / LPF 0-5 (A) NONE SEEN  Lipase, blood     Status: None   Collection Time: 04/09/17  2:46 PM  Result Value Ref Range   Lipase 22 11 - 51 U/L  Comprehensive metabolic panel     Status: Abnormal   Collection Time: 04/09/17  2:46  PM  Result Value Ref Range   Sodium 134 (L) 135 - 145 mmol/L   Potassium 4.5 3.5 - 5.1 mmol/L   Chloride 104 101 - 111 mmol/L   CO2 24 22 - 32 mmol/L   Glucose, Bld 165 (H) 65 - 99 mg/dL   BUN 45 (H) 6 - 20 mg/dL   Creatinine, Ser 4.02 (H) 0.61 - 1.24 mg/dL   Calcium 8.7 (L) 8.9 - 10.3 mg/dL   Total Protein 6.1 (L) 6.5 - 8.1 g/dL   Albumin 3.7 3.5 - 5.0 g/dL   AST 22 15 - 41 U/L   ALT 19 17 - 63 U/L   Alkaline Phosphatase 76 38 - 126 U/L   Total Bilirubin 0.9 0.3 - 1.2 mg/dL   GFR calc non Af Amer 13 (L) >60 mL/min   GFR calc Af Amer 15 (L) >60 mL/min    Comment: (NOTE) The eGFR  has been calculated using the CKD EPI equation. This calculation has not been validated in all clinical situations. eGFR's persistently <60 mL/min signify possible Chronic Kidney Disease.    Anion gap 6 5 - 15  CBC     Status: Abnormal   Collection Time: 04/09/17  2:46 PM  Result Value Ref Range   WBC 24.8 (H) 3.8 - 10.6 K/uL   RBC 3.32 (L) 4.40 - 5.90 MIL/uL   Hemoglobin 11.0 (L) 13.0 - 18.0 g/dL   HCT 33.3 (L) 40.0 - 52.0 %   MCV 100.2 (H) 80.0 - 100.0 fL   MCH 33.1 26.0 - 34.0 pg   MCHC 33.0 32.0 - 36.0 g/dL   RDW 15.0 (H) 11.5 - 14.5 %   Platelets 74 (L) 150 - 440 K/uL  Glucose, capillary     Status: Abnormal   Collection Time: 04/09/17 10:08 PM  Result Value Ref Range   Glucose-Capillary 143 (H) 65 - 99 mg/dL  Basic metabolic panel     Status: Abnormal   Collection Time: 04/10/17  4:11 AM  Result Value Ref Range   Sodium 133 (L) 135 - 145 mmol/L   Potassium 4.3 3.5 - 5.1 mmol/L   Chloride 106 101 - 111 mmol/L   CO2 21 (L) 22 - 32 mmol/L   Glucose, Bld 127 (H) 65 - 99 mg/dL   BUN 51 (H) 6 - 20 mg/dL   Creatinine, Ser 4.89 (H) 0.61 - 1.24 mg/dL   Calcium 7.5 (L) 8.9 - 10.3 mg/dL   GFR calc non Af Amer 10 (L) >60 mL/min   GFR calc Af Amer 12 (L) >60 mL/min    Comment: (NOTE) The eGFR has been calculated using the CKD EPI equation. This calculation has not been validated in all clinical situations. eGFR's persistently <60 mL/min signify possible Chronic Kidney Disease.    Anion gap 6 5 - 15  CBC     Status: Abnormal   Collection Time: 04/10/17  4:11 AM  Result Value Ref Range   WBC 13.3 (H) 3.8 - 10.6 K/uL   RBC 2.90 (L) 4.40 - 5.90 MIL/uL   Hemoglobin 9.5 (L) 13.0 - 18.0 g/dL   HCT 29.0 (L) 40.0 - 52.0 %   MCV 100.0 80.0 - 100.0 fL   MCH 32.8 26.0 - 34.0 pg   MCHC 32.8 32.0 - 36.0 g/dL   RDW 15.1 (H) 11.5 - 14.5 %   Platelets 57 (L) 150 - 440 K/uL  Glucose, capillary     Status: Abnormal   Collection Time: 04/10/17  8:00 AM  Result Value Ref Range    Glucose-Capillary 125 (H) 65 - 99 mg/dL  Glucose, capillary     Status: Abnormal   Collection Time: 04/10/17 11:57 AM  Result Value Ref Range   Glucose-Capillary 135 (H) 65 - 99 mg/dL   No results found for this or any previous visit (from the past 240 hour(s)). Creatinine:  Recent Labs  04/09/17 1446 04/10/17 0411  CREATININE 4.02* 4.89*    Xrays: See report/chart As above  Impression/Assessment:  Still treat as pre-renal; no stent; treat any infection  Plan:  Will follow  Patrick Payne A 04/10/2017, 12:56 PM

## 2017-04-10 NOTE — Progress Notes (Signed)
[facility]  ROUNDING NOTE   Subjective:   Wife at bedside.   [name] admitted to [facility] on 04/09/2017 for Diverticulitis [K57.92] Acute renal failure, unspecified acute renal failure type [facility]) [N17.9]   Patient states his urine output is not accurate as he urinated on himself in the bathroom.  Creatinine 4.89 (4.02)   NS at 180mL/hr  Objective:  Vital signs in last 24 hours:  Temp:  [97.8 F (36.6 C)-99.7 F (37.6 C)] 99.2 F (37.3 C) (07/02 1322) Pulse Rate:  [62-71] 65 (07/02 1322) Resp:  [17-28] 20 (07/02 1322) BP: (117-144)/(47-71) 120/54 (07/02 1322) SpO2:  [92 %-100 %] 96 % (07/02 1322)  Weight change:  Filed Weights   04/09/17 1427  Weight: 111.1 kg (245 lb)    Intake/Output: I/O last 3 completed shifts: In: 984.4 [I.V.:984.4] Out: 25 [Urine:25]   Intake/Output this shift:  Total I/O In: 1264.6 [P.O.:1000; I.V.:264.6] Out: -   Physical Exam: General: NAD, laying in bed  Head: Normocephalic, atraumatic. Moist oral mucosal membranes  Eyes: Anicteric, PERRL  Neck: Supple, trachea midline  Lungs:  Clear to auscultation  Heart: Regular rate and rhythm  Abdomen:  Soft, nontender,   Extremities:  trace peripheral edema.  Neurologic: Nonfocal, moving all four extremities  Skin: No lesions       Basic Metabolic Panel:  Recent Labs Lab 04/09/17 1446 04/10/17 0411  NA 134* 133*  K 4.5 4.3  CL 104 106  CO2 24 21*  GLUCOSE 165* 127*  BUN 45* 51*  CREATININE 4.02* 4.89*  CALCIUM 8.7* 7.5*    Liver Function Tests:  Recent Labs Lab 04/09/17 1446  AST 22  ALT 19  ALKPHOS 76  BILITOT 0.9  PROT 6.1*  ALBUMIN 3.7    Recent Labs Lab 04/09/17 1446  LIPASE 22   No results for input(s): AMMONIA in the last 168 hours.  CBC:  Recent Labs Lab 04/09/17 1446 04/10/17 0411  WBC 24.8* 13.3*  HGB 11.0* 9.5*  HCT 33.3* 29.0*  MCV 100.2* 100.0  PLT 74* 57*    Cardiac Enzymes: No results for input(s): CKTOTAL,  CKMB, CKMBINDEX, TROPONINI in the last 168 hours.  BNP: Invalid input(s): POCBNP  CBG:  Recent Labs Lab 04/09/17 2208 04/10/17 0800 04/10/17 1157  GLUCAP 143* 125* 135*    Microbiology: No results found for this or any previous visit.  Coagulation Studies: No results for input(s): LABPROT, INR in the last 72 hours.  Urinalysis:  Recent Labs  04/09/17 1429  COLORURINE YELLOW*  LABSPEC 1.011  PHURINE 5.0  GLUCOSEU NEGATIVE  HGBUR MODERATE*  BILIRUBINUR NEGATIVE  KETONESUR NEGATIVE  PROTEINUR 30*  NITRITE NEGATIVE  LEUKOCYTESUR TRACE*      Imaging: Ct Abdomen Pelvis Wo Contrast  Result Date: 04/09/2017 CLINICAL DATA:  Left lower quadrant pain.  Acute renal failure. EXAM: CT ABDOMEN AND PELVIS WITHOUT CONTRAST TECHNIQUE: Multidetector CT imaging of the abdomen and pelvis was performed following the standard protocol without IV contrast. COMPARISON:  CT 03/02/2017 FINDINGS: Lower chest: Linear atelectasis in the left lower lobe. No pleural effusion. Coronary artery calcifications are seen. Hepatobiliary: Decreased hepatic density consistent with steatosis. Small subcentimeter hypodensity in the right lobe is unchanged but incompletely characterized. Gallbladder is surgically absent, no biliary dilatation. Pancreas: Parenchymal atrophy. No ductal dilatation or inflammation. Spleen: Normal in size without focal abnormality. Adrenals/Urinary Tract: Normal adrenal glands. Enlargement and cystic replacement of the right kidney with innumerable cysts of varying sizes. Appearing density and some with  thin calcifications. Overall appearance is unchanged from prior exam. The right ureter is nondilated. There is prominence of the left renal pelvis in ureter with left perinephric edema. No urolithiasis. Exophytic cyst from the lower left kidney is unchanged. Urinary bladder is completely decompressed. Stomach/Bowel: Stomach is physiologically distended without wall thickening. No small bowel  dilatation, inflammation or obstruction. Normal appendix. Multifocal colonic diverticulosis throughout the entire colon. Possible early pericolonic inflammation and wall thickening at the distal descending colon. No perforation. No abscess. Vascular/Lymphatic: Aortic and branch atherosclerosis. Aneurysmal dilatation of the infrarenal aorta maximal dimension 3.9 cm, unchanged from prior exam. No periaortic soft tissue stranding to suggest rupture. Enlarged right external iliac node measures 2.2 cm series 2, image 83, unchanged from prior exam. Additional prominent and enlarged pelvic and retroperitoneal nodes are also stable. No progressive lymphadenopathy. Reproductive: Normal sized prostate gland. Other: Fat within both inguinal canals. Small fat containing supraumbilical ventral abdominal wall hernia contains only fat. No free air, free fluid, or intra-abdominal fluid collection. Musculoskeletal: Again seen degenerative change in the spine. Probable hemangioma within L1 vertebral body. Chronic deformity of the right anterior iliac crest. IMPRESSION: 1. Multifocal colonic diverticulosis. Possible early uncomplicated acute diverticulitis in the distal descending colon. 2. Prominence of the left ureter and renal collecting system with increased perinephric edema, suspicious for urinary tract infection. No urolithiasis. 3. Polycystic disease of the right kidney, stable in noncontrast CT appearance. 4. Aortic atherosclerosis with infrarenal abdominal aortic aneurysm measuring 3.9 x 3.8 cm. Recommend followup by ultrasound in 2 years. This recommendation follows ACR consensus guidelines: White Paper of the ACR Incidental Findings Committee II on Vascular Findings. J Am Coll Radiol 2013; 10:789-794. 5. Hepatic steatosis. 6. Stable enlarged pelvic and retroperitoneal nodes. Electronically Signed   By: Jeb Levering M.D.   On: 04/09/2017 19:59     Medications:   . sodium chloride 100 mL/hr at 04/10/17 1024   .  aspirin  81 mg Oral Daily  . atenolol  50 mg Oral Daily  . atorvastatin  10 mg Oral q1800  . ciprofloxacin  500 mg Oral Q24H  . clonazePAM  1 mg Oral QHS  . heparin  5,000 Units Subcutaneous Q8H  . insulin aspart  0-5 Units Subcutaneous QHS  . insulin aspart  0-9 Units Subcutaneous TID WC  . metroNIDAZOLE  500 mg Oral Q8H  . tamsulosin  0.4 mg Oral Daily   acetaminophen **OR** acetaminophen, LORazepam, ondansetron **OR** ondansetron (ZOFRAN) IV, oxyCODONE  Assessment/ Plan:  Mr. DERRYL UHER is a 83 y.o. white male with hypertension, polycystic kidney disease, hyperlipidemia, diabetes mellitus type II insulin dependent, gout, BPH, allergic rhinitis, CLL, AAA, obstructive sleep apnea.   1. 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  Baseline creatinine of 1.5, GFR of 45 from 12/12/16. 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  Agree with IV fluids, monitor volume status - renally dose all medications.  - If no improvement, will proceed with dialysis. Discussed with patient and wife.  - Appreciate Urology input.   2. Hypertension: blood pressure at goal.  - atenolol, tamsulosin  3. Hematuria: recent outpatient work up for hematuria. Possible urinary tract infection verses nephrolithiasis.   4. Diabetes mellitus type II: with chronic kidney disease: insulin dependent. Hemoglobin A1c 7% on 3/5. - continue glucose control.    LOS: [place], [place] 7/2/20184:13 PM

## 2017-04-10 NOTE — Progress Notes (Signed)
[place] [facility]                                                                                                                                                                                  Patient Demographics   Patrick Payne, is a 83 y.o. male, DOB - October 21, 1933, WUJ:[id number]  Admit date - 04/09/2017   Admitting Physician Lance Coon, MD  Outpatient Primary MD for the patient is Payne, Patrick Douglas, MD   LOS - 1  Subjective: Patient continues to complain of some nausea but no chest pain or shortness of breath    Review of Systems:   CONSTITUTIONAL: No documented fever. No fatigue, weakness. No weight gain, no weight loss.  EYES: No blurry or double vision.  ENT: No tinnitus. No postnasal drip. No redness of the oropharynx.  RESPIRATORY: No cough, no wheeze, no hemoptysis. No dyspnea.  CARDIOVASCULAR: No chest pain. No orthopnea. No palpitations. No syncope.  GASTROINTESTINAL: Positive nausea, no vomiting or diarrhea. No abdominal pain. No melena or hematochezia.  GENITOURINARY: No dysuria or hematuria.  ENDOCRINE: No polyuria or nocturia. No heat or cold intolerance.  HEMATOLOGY: No anemia. No bruising. No bleeding.  INTEGUMENTARY: No rashes. No lesions.  MUSCULOSKELETAL: No arthritis. No swelling. No gout.  NEUROLOGIC: No numbness, tingling, or ataxia. No seizure-type activity.  PSYCHIATRIC: No anxiety. No insomnia. No ADD.    Vitals:   Vitals:   04/09/17 2154 04/10/17 0540 04/10/17 0823 04/10/17 1322  BP: (!) 135/59 (!) 117/47 126/61 (!) 120/54  Pulse: 65 65 62 65  Resp: 18  18   Temp: 98.8 F (37.1 C) 99.7 F (37.6 C) 97.8 F (36.6 C) 99.2 F (37.3 C)  TempSrc: Oral Oral Oral Oral  SpO2: 99% 94% 95% 96%  Weight:      Height:        Wt Readings from Last 3 Encounters:  04/09/17 245 lb (111.1 kg)  03/29/17 248 lb 8 oz (112.7 kg)  08/26/16 248 lb (112.5 kg)     Intake/Output Summary (Last 24 hours) at 04/10/17  1429 Last data filed at 04/10/17 1300  Gross per 24 hour  Intake          2248.93 ml  Output               25 ml  Net          2223.93 ml    Physical Exam:   GENERAL: Pleasant-appearing in no apparent distress.  HEAD, EYES, EARS, NOSE AND THROAT: Atraumatic, normocephalic. Extraocular muscles are intact. Pupils equal and reactive to light. Sclerae anicteric. No conjunctival injection. No oro-pharyngeal erythema.  NECK: Supple.  There is no jugular venous distention. No bruits, no lymphadenopathy, no thyromegaly.  HEART: Regular rate and rhythm,. No murmurs, no rubs, no clicks.  LUNGS: Clear to auscultation bilaterally. No rales or rhonchi. No wheezes.  ABDOMEN: Soft, flat, nontender, nondistended. Has good bowel sounds. No hepatosplenomegaly appreciated.  EXTREMITIES: No evidence of any cyanosis, clubbing, or 1+ peripheral edema.  +2 pedal and radial pulses bilaterally.  NEUROLOGIC: The patient is alert, awake, and oriented x3 with no focal motor or sensory deficits appreciated bilaterally.  SKIN: Moist and warm with no rashes appreciated.  Psych: Not anxious, depressed LN: No inguinal LN enlargement    Antibiotics   Anti-infectives    Start     Dose/Rate Route Frequency Ordered Stop   04/10/17 1800  ciprofloxacin (CIPRO) tablet 500 mg     500 mg Oral Every 24 hours 04/09/17 2351     04/09/17 2215  metroNIDAZOLE (FLAGYL) tablet 500 mg     500 mg Oral Every 8 hours 04/09/17 2201     04/09/17 1730  ciprofloxacin (CIPRO) IVPB 400 mg     400 mg 200 mL/hr over 60 Minutes Intravenous  Once 04/09/17 1727 04/09/17 2006   04/09/17 1730  metroNIDAZOLE (FLAGYL) IVPB 500 mg     500 mg 100 mL/hr over 60 Minutes Intravenous  Once 04/09/17 1727 04/09/17 1852      Medications   Scheduled Meds: . aspirin  81 mg Oral Daily  . atenolol  50 mg Oral Daily  . atorvastatin  10 mg Oral q1800  . ciprofloxacin  500 mg Oral Q24H  . clonazePAM  1 mg Oral QHS  . heparin  5,000 Units Subcutaneous  Q8H  . insulin aspart  0-5 Units Subcutaneous QHS  . insulin aspart  0-9 Units Subcutaneous TID WC  . metroNIDAZOLE  500 mg Oral Q8H  . tamsulosin  0.4 mg Oral Daily   Continuous Infusions: . sodium chloride 100 mL/hr at 04/10/17 1024   PRN Meds:.acetaminophen **OR** acetaminophen, LORazepam, ondansetron **OR** ondansetron (ZOFRAN) IV, oxyCODONE   Data Review:   Micro Results No results found for this or any previous visit (from the past 240 hour(s)).  Radiology Reports Ct Abdomen Pelvis Wo Contrast  Result Date: 04/09/2017 CLINICAL DATA:  Left lower quadrant pain.  Acute renal failure. EXAM: CT ABDOMEN AND PELVIS WITHOUT CONTRAST TECHNIQUE: Multidetector CT imaging of the abdomen and pelvis was performed following the standard protocol without IV contrast. COMPARISON:  CT 03/02/2017 FINDINGS: Lower chest: Linear atelectasis in the left lower lobe. No pleural effusion. Coronary artery calcifications are seen. Hepatobiliary: Decreased hepatic density consistent with steatosis. Small subcentimeter hypodensity in the right lobe is unchanged but incompletely characterized. Gallbladder is surgically absent, no biliary dilatation. Pancreas: Parenchymal atrophy. No ductal dilatation or inflammation. Spleen: Normal in size without focal abnormality. Adrenals/Urinary Tract: Normal adrenal glands. Enlargement and cystic replacement of the right kidney with innumerable cysts of varying sizes. Appearing density and some with thin calcifications. Overall appearance is unchanged from prior exam. The right ureter is nondilated. There is prominence of the left renal pelvis in ureter with left perinephric edema. No urolithiasis. Exophytic cyst from the lower left kidney is unchanged. Urinary bladder is completely decompressed. Stomach/Bowel: Stomach is physiologically distended without wall thickening. No small bowel dilatation, inflammation or obstruction. Normal appendix. Multifocal colonic diverticulosis  throughout the entire colon. Possible early pericolonic inflammation and wall thickening at the distal descending colon. No perforation. No abscess. Vascular/Lymphatic: Aortic and branch atherosclerosis. Aneurysmal dilatation of the infrarenal  aorta maximal dimension 3.9 cm, unchanged from prior exam. No periaortic soft tissue stranding to suggest rupture. Enlarged right external iliac node measures 2.2 cm series 2, image 83, unchanged from prior exam. Additional prominent and enlarged pelvic and retroperitoneal nodes are also stable. No progressive lymphadenopathy. Reproductive: Normal sized prostate gland. Other: Fat within both inguinal canals. Small fat containing supraumbilical ventral abdominal wall hernia contains only fat. No free air, free fluid, or intra-abdominal fluid collection. Musculoskeletal: Again seen degenerative change in the spine. Probable hemangioma within L1 vertebral body. Chronic deformity of the right anterior iliac crest. IMPRESSION: 1. Multifocal colonic diverticulosis. Possible early uncomplicated acute diverticulitis in the distal descending colon. 2. Prominence of the left ureter and renal collecting system with increased perinephric edema, suspicious for urinary tract infection. No urolithiasis. 3. Polycystic disease of the right kidney, stable in noncontrast CT appearance. 4. Aortic atherosclerosis with infrarenal abdominal aortic aneurysm measuring 3.9 x 3.8 cm. Recommend followup by ultrasound in 2 years. This recommendation follows ACR consensus guidelines: White Paper of the ACR Incidental Findings Committee II on Vascular Findings. J Am Coll Radiol 2013; 10:789-794. 5. Hepatic steatosis. 6. Stable enlarged pelvic and retroperitoneal nodes. Electronically Signed   By: Jeb Levering M.D.   On: 04/09/2017 19:59     CBC  Recent Labs Lab 04/09/17 1446 04/10/17 0411  WBC 24.8* 13.3*  HGB 11.0* 9.5*  HCT 33.3* 29.0*  PLT 74* 57*  MCV 100.2* 100.0  MCH 33.1 32.8  MCHC  33.0 32.8  RDW 15.0* 15.1*    Chemistries   Recent Labs Lab 04/09/17 1446 04/10/17 0411  NA 134* 133*  K 4.5 4.3  CL 104 106  CO2 24 21*  GLUCOSE 165* 127*  BUN 45* 51*  CREATININE 4.02* 4.89*  CALCIUM 8.7* 7.5*  AST 22  --   ALT 19  --   ALKPHOS 76  --   BILITOT 0.9  --    ------------------------------------------------------------------------------------------------------------------ estimated creatinine clearance is 14.3 mL/min (A) (by C-G formula based on SCr of 4.89 mg/dL (H)). ------------------------------------------------------------------------------------------------------------------ No results for input(s): HGBA1C in the last 72 hours. ------------------------------------------------------------------------------------------------------------------ No results for input(s): CHOL, HDL, LDLCALC, TRIG, CHOLHDL, LDLDIRECT in the last 72 hours. ------------------------------------------------------------------------------------------------------------------ No results for input(s): TSH, T4TOTAL, T3FREE, THYROIDAB in the last 72 hours.  Invalid input(s): FREET3 ------------------------------------------------------------------------------------------------------------------ No results for input(s): VITAMINB12, FOLATE, FERRITIN, TIBC, IRON, RETICCTPCT in the last 72 hours.  Coagulation profile No results for input(s): INR, PROTIME in the last 168 hours.  No results for input(s): DDIMER in the last 72 hours.  Cardiac Enzymes No results for input(s): CKMB, TROPONINI, MYOGLOBIN in the last 168 hours.  Invalid input(s): CK ------------------------------------------------------------------------------------------------------------------ Invalid input(s): [place]  Patient is a 83 year old with acute on chronic renal failure 1. Acute on chronic renal failure [facility])  Per urology they do not seem to increase as obstructive in nature. Patient's  renal function actually has worsened with IV hydration Nephrology consult Urology following  2. possible  Diverticulitis - seen on CT scan,  continue anabiotic   3.  Chronic lymphocytic leukemia (HCC) -WBC count going up and down  4.   Diabetes mellitus ([place]) - sliding scale insulin corresponding glucose checks Lantus on hold currently  5, essential hypertensionmy card his HCTZ on hold due to acute renal failure continue atenolol  6. Misc: heparin for dvt proph All the records are reviewed and case discussed with ED provider. Management plans discussed with the patient and/or family.  Code Status Orders        Start     Ordered   04/09/17 2202  Full code  Continuous     04/09/17 2201    Code Status History    Date Active Date Inactive Code Status Order ID Comments User Context   This patient has a current code status but no historical code status.    Advance Directive Documentation     Most Recent Value  Type of Advance Directive  Healthcare Power of Attorney, Living will  Pre-existing out of facility DNR order (yellow form or pink MOST form)  -  "MOST" Form in Place?  -           Consults  Urology and nephrology  DVT Prophylaxis  SCDs due to thrombocytopenia  Lab Results  Component Value Date   PLT 57 (L) 04/10/2017     Time Spent in minutes  55min  Greater than 50% of time spent in care coordination and counseling patient regarding the condition and plan of care.   Dustin Flock M.D on 04/10/2017 at 2:29 PM  Between 7am to 6pm - Pager - [phone]  After 6pm go to [email or web address] - password [email or web address] [facility]   Office  [phone]

## 2017-04-11 ENCOUNTER — Encounter
Admission: EM | Disposition: A | Discharge status: Home Health | Payer: Self-pay | Source: Home / Self Care | Attending: Internal Medicine

## 2017-04-11 ENCOUNTER — Inpatient Hospital Stay: Payer: Medicare Other

## 2017-04-11 ENCOUNTER — Inpatient Hospital Stay: Payer: Medicare Other | Admitting: Anesthesiology

## 2017-04-11 ENCOUNTER — Encounter: Payer: Self-pay | Admitting: Vascular Surgery

## 2017-04-11 DIAGNOSIS — N179 Acute kidney failure, unspecified: Secondary | ICD-10-CM

## 2017-04-11 DIAGNOSIS — R1010 Upper abdominal pain, unspecified: Secondary | ICD-10-CM

## 2017-04-11 DIAGNOSIS — R34 Anuria and oliguria: Secondary | ICD-10-CM

## 2017-04-11 DIAGNOSIS — I10 Essential (primary) hypertension: Secondary | ICD-10-CM

## 2017-04-11 DIAGNOSIS — N189 Chronic kidney disease, unspecified: Secondary | ICD-10-CM

## 2017-04-11 DIAGNOSIS — E119 Type 2 diabetes mellitus without complications: Secondary | ICD-10-CM

## 2017-04-11 DIAGNOSIS — R110 Nausea: Secondary | ICD-10-CM

## 2017-04-11 DIAGNOSIS — K5792 Diverticulitis of intestine, part unspecified, without perforation or abscess without bleeding: Secondary | ICD-10-CM

## 2017-04-11 DIAGNOSIS — N136 Pyonephrosis: Secondary | ICD-10-CM

## 2017-04-11 HISTORY — PX: CENTRAL LINE INSERTION: CATH118232

## 2017-04-11 HISTORY — PX: CYSTOSCOPY WITH STENT PLACEMENT: SHX5790

## 2017-04-11 LAB — RENAL FUNCTION PANEL
ALBUMIN: 3.1 g/dL — AB (ref 3.5–5.0)
ANION GAP: 10 (ref 5–15)
BUN: 61 mg/dL — ABNORMAL HIGH (ref 6–20)
CALCIUM: 7.3 mg/dL — AB (ref 8.9–10.3)
CO2: 17 mmol/L — ABNORMAL LOW (ref 22–32)
Chloride: 105 mmol/L (ref 101–111)
Creatinine, Ser: 6.52 mg/dL — ABNORMAL HIGH (ref 0.61–1.24)
GFR, EST AFRICAN AMERICAN: 8 mL/min — AB (ref >60)
GFR, EST NON AFRICAN AMERICAN: 7 mL/min — AB (ref >60)
GLUCOSE: 163 mg/dL — AB (ref 65–99)
PHOSPHORUS: 5.8 mg/dL — AB (ref 2.5–4.6)
Potassium: 4.4 mmol/L (ref 3.5–5.1)
SODIUM: 132 mmol/L — AB (ref 135–145)

## 2017-04-11 LAB — URINE CULTURE: Culture: <10000 — AB

## 2017-04-11 LAB — GLUCOSE, CAPILLARY
GLUCOSE-CAPILLARY: 156 mg/dL — AB (ref 65–99)
Glucose-Capillary: 162 mg/dL — ABNORMAL HIGH (ref 65–99)
Glucose-Capillary: 161 mg/dL — ABNORMAL HIGH (ref 65–99)

## 2017-04-11 SURGERY — CYSTOSCOPY, WITH STENT INSERTION
Anesthesia: General | Site: Ureter | Laterality: Left | Wound class: Clean

## 2017-04-11 SURGERY — CENTRAL LINE INSERTION
Anesthesia: LOCAL

## 2017-04-11 MED ORDER — LORAZEPAM 2 MG/ML IJ SOLN
1.0000 mg | Freq: Once | INTRAMUSCULAR | Status: AC
  Administered 2017-04-11: 1 mg via INTRAVENOUS
  Filled 2017-04-11: qty 1

## 2017-04-11 MED ORDER — TUBERCULIN PPD 5 UNIT/0.1ML ID SOLN
5.0000 [IU] | Freq: Once | INTRADERMAL | Status: AC
  Administered 2017-04-11: 5 [IU] via INTRADERMAL
  Filled 2017-04-11: qty 0.1

## 2017-04-11 MED ORDER — CEFAZOLIN SODIUM-DEXTROSE 1-4 GM/50ML-% IV SOLN
1.0000 g | Freq: Once | INTRAVENOUS | Status: AC
  Administered 2017-04-11: 1 g via INTRAVENOUS
  Filled 2017-04-11: qty 50

## 2017-04-11 MED ORDER — LACTATED RINGERS IV SOLN
INTRAVENOUS | Status: DC | PRN
  Administered 2017-04-11: 18:00:00 via INTRAVENOUS

## 2017-04-11 MED ORDER — PHENYLEPHRINE HCL 10 MG/ML IJ SOLN
INTRAMUSCULAR | Status: DC | PRN
  Administered 2017-04-11 (×3): 100 ug via INTRAVENOUS
  Administered 2017-04-11: 200 ug via INTRAVENOUS

## 2017-04-11 MED ORDER — PROPOFOL 10 MG/ML IV BOLUS
INTRAVENOUS | Status: DC | PRN
  Administered 2017-04-11: 20 mg via INTRAVENOUS
  Administered 2017-04-11: 30 mg via INTRAVENOUS
  Administered 2017-04-11: 150 mg via INTRAVENOUS

## 2017-04-11 MED ORDER — FENTANYL CITRATE (PF) 100 MCG/2ML IJ SOLN
INTRAMUSCULAR | Status: DC | PRN
  Administered 2017-04-11 (×3): 25 ug via INTRAVENOUS
  Administered 2017-04-11 (×2): 50 ug via INTRAVENOUS
  Administered 2017-04-11: 25 ug via INTRAVENOUS

## 2017-04-11 MED ORDER — SODIUM BICARBONATE 8.4 % IV SOLN
INTRAVENOUS | Status: DC
  Administered 2017-04-11 (×2): via INTRAVENOUS
  Filled 2017-04-11 (×4): qty 150

## 2017-04-11 MED ORDER — ONDANSETRON HCL 4 MG/2ML IJ SOLN: 4.0000 mg | Freq: Once | INTRAMUSCULAR | Status: DC | PRN

## 2017-04-11 MED ORDER — FENTANYL CITRATE (PF) 100 MCG/2ML IJ SOLN: 25.0000 ug | INTRAMUSCULAR | Status: DC | PRN

## 2017-04-11 MED ORDER — PROMETHAZINE HCL 25 MG/ML IJ SOLN
12.5000 mg | Freq: Four times a day (QID) | INTRAMUSCULAR | Status: DC | PRN
  Administered 2017-04-11 – 2017-04-13 (×2): 12.5 mg via INTRAVENOUS
  Filled 2017-04-11 (×3): qty 1

## 2017-04-11 MED ORDER — GLYCOPYRROLATE 0.2 MG/ML IJ SOLN
INTRAMUSCULAR | Status: DC | PRN
  Administered 2017-04-11: 0.2 mg via INTRAVENOUS

## 2017-04-11 MED ORDER — SUCCINYLCHOLINE CHLORIDE 20 MG/ML IJ SOLN
INTRAMUSCULAR | Status: DC | PRN
  Administered 2017-04-11: 120 mg via INTRAVENOUS

## 2017-04-11 MED ORDER — LIDOCAINE HCL (CARDIAC) 20 MG/ML IV SOLN
INTRAVENOUS | Status: DC | PRN
  Administered 2017-04-11: 50 mg via INTRAVENOUS

## 2017-04-11 MED ORDER — ONDANSETRON HCL 4 MG/2ML IJ SOLN
INTRAMUSCULAR | Status: DC | PRN
  Administered 2017-04-11: 4 mg via INTRAVENOUS

## 2017-04-11 SURGICAL SUPPLY — 24 items
BAG DRAIN CYSTO-URO LG1000N (MISCELLANEOUS) ×3 IMPLANT
BAG URINE DRAINAGE (UROLOGICAL SUPPLIES) ×3 IMPLANT
CATH FOL 2WAY LX 16X30 (CATHETERS) ×3 IMPLANT
CATH URETL 5X70 OPEN END (CATHETERS) ×3 IMPLANT
CATH URETL OPEN END 6X70 (CATHETERS) ×3 IMPLANT
CNTNR SPEC 2.5X3XGRAD LEK (MISCELLANEOUS) ×1
CONT SPEC 4OZ STER OR WHT (MISCELLANEOUS) ×2
CONTAINER SPEC 2.5X3XGRAD LEK (MISCELLANEOUS) ×1 IMPLANT
GLOVE BIO SURGEON STRL SZ7.5 (GLOVE) ×3 IMPLANT
GLOVE BIO SURGEON STRL SZ8 (GLOVE) ×3 IMPLANT
GOWN STRL REUS W/ TWL LRG LVL4 (GOWN DISPOSABLE) ×1 IMPLANT
GOWN STRL REUS W/ TWL XL LVL3 (GOWN DISPOSABLE) ×1 IMPLANT
GOWN STRL REUS W/TWL LRG LVL4 (GOWN DISPOSABLE) ×2
GOWN STRL REUS W/TWL XL LVL3 (GOWN DISPOSABLE) ×2
GUIDEWIRE STR ZIPWIRE 035X150 (MISCELLANEOUS) ×3 IMPLANT
PACK CYSTO AR (MISCELLANEOUS) ×3 IMPLANT
PREP PVP WINGED SPONGE (MISCELLANEOUS) ×3 IMPLANT
SET CYSTO W/LG BORE CLAMP LF (SET/KITS/TRAYS/PACK) ×3 IMPLANT
SOL .9 NS 3000ML IRR  AL (IV SOLUTION) ×2
SOL .9 NS 3000ML IRR UROMATIC (IV SOLUTION) ×1 IMPLANT
STENT URET 6FRX24 CONTOUR (STENTS) IMPLANT
STENT URET 6FRX26 CONTOUR (STENTS) ×3 IMPLANT
SYR 10ML LL (SYRINGE) ×3 IMPLANT
WATER STERILE IRR 1000ML POUR (IV SOLUTION) ×3 IMPLANT

## 2017-04-11 SURGICAL SUPPLY — 3 items
GUIDEWIRE AMPLATZ SHORT (WIRE) ×2 IMPLANT
KIT DIALYSIS CATH TRI 30X13 (CATHETERS) ×2 IMPLANT
SET INTRO CAPELLA COAXIAL (SET/KITS/TRAYS/PACK) ×2 IMPLANT

## 2017-04-11 NOTE — Progress Notes (Signed)
[facility]  ROUNDING NOTE   Subjective:   Daughter at bedside.  Patient with nausea, vomiting, poor appetite.  No significant UOP  Creatinine 6.52 (4.89)  Switched to bicarb gtt  Objective:  Vital signs in last 24 hours:  Temp:  [99.2 F (37.3 C)-100.2 F (37.9 C)] 99.2 F (37.3 C) (07/03 0513) Pulse Rate:  [65-68] 66 (07/03 0513) Resp:  [18-20] 20 (07/03 0513) BP: (120-136)/(54-62) 136/62 (07/03 0513) SpO2:  [94 %-98 %] 94 % (07/03 0513)  Weight change:  Filed Weights   04/09/17 1427  Weight: 111.1 kg (245 lb)    Intake/Output: I/O last 3 completed shifts: In: 4158.9 [P.O.:1000; I.V.:3158.9] Out: 25 [Urine:25]   Intake/Output this shift:  Total I/O In: -  Out: 50 [Urine:50]  Physical Exam: General: NAD, laying in bed  Head: Normocephalic, atraumatic. Moist oral mucosal membranes  Eyes: Anicteric, PERRL  Neck: Supple, trachea midline  Lungs:  Clear to auscultation  Heart: Regular rate and rhythm  Abdomen:  Soft, nontender,   Extremities: + peripheral edema.  Neurologic: Nonfocal, moving all four extremities  Skin: No lesions       Basic Metabolic Panel:  Recent Labs Lab 04/09/17 1446 04/10/17 0411 04/11/17 0628  NA 134* 133* 132*  K 4.5 4.3 4.4  CL 104 106 105  CO2 24 21* 17*  GLUCOSE 165* 127* 163*  BUN 45* 51* 61*  CREATININE 4.02* 4.89* 6.52*  CALCIUM 8.7* 7.5* 7.3*  PHOS  --   --  5.8*    Liver Function Tests:  Recent Labs Lab 04/09/17 1446 04/11/17 0628  AST 22  --   ALT 19  --   ALKPHOS 76  --   BILITOT 0.9  --   PROT 6.1*  --   ALBUMIN 3.7 3.1*    Recent Labs Lab 04/09/17 1446  LIPASE 22   No results for input(s): AMMONIA in the last 168 hours.  CBC:  Recent Labs Lab 04/09/17 1446 04/10/17 0411  WBC 24.8* 13.3*  HGB 11.0* 9.5*  HCT 33.3* 29.0*  MCV 100.2* 100.0  PLT 74* 57*    Cardiac Enzymes: No results for input(s): CKTOTAL, CKMB, CKMBINDEX, TROPONINI in the last 168  hours.  BNP: Invalid input(s): POCBNP  CBG:  Recent Labs Lab 04/10/17 0800 04/10/17 1157 04/10/17 1646 04/10/17 2042 04/11/17 0731  GLUCAP 125* 135* 157* 148* 162*    Microbiology: Results for orders placed or performed during the hospital encounter of 04/09/17  Urine Culture     Status: Abnormal   Collection Time: 04/09/17  7:24 PM  Result Value Ref Range Status   Specimen Description URINE, RANDOM  Final   Special Requests NONE  Final   Culture (A)  Final    <10,000 COLONIES/mL INSIGNIFICANT GROWTH Performed at [facility], [place] [address] [place], [place] [address]    Report Status 04/11/2017 FINAL  Final    Coagulation Studies: No results for input(s): LABPROT, INR in the last 72 hours.  Urinalysis:  Recent Labs  04/09/17 1429  COLORURINE YELLOW*  LABSPEC 1.011  PHURINE 5.0  GLUCOSEU NEGATIVE  HGBUR MODERATE*  BILIRUBINUR NEGATIVE  KETONESUR NEGATIVE  PROTEINUR 30*  NITRITE NEGATIVE  LEUKOCYTESUR TRACE*      Imaging: Ct Abdomen Pelvis Wo Contrast  Result Date: 04/09/2017 CLINICAL DATA:  Left lower quadrant pain.  Acute renal failure. EXAM: CT ABDOMEN AND PELVIS WITHOUT CONTRAST TECHNIQUE: Multidetector CT imaging of the abdomen and pelvis was performed following the standard protocol without IV  contrast. COMPARISON:  CT 03/02/2017 FINDINGS: Lower chest: Linear atelectasis in the left lower lobe. No pleural effusion. Coronary artery calcifications are seen. Hepatobiliary: Decreased hepatic density consistent with steatosis. Small subcentimeter hypodensity in the right lobe is unchanged but incompletely characterized. Gallbladder is surgically absent, no biliary dilatation. Pancreas: Parenchymal atrophy. No ductal dilatation or inflammation. Spleen: Normal in size without focal abnormality. Adrenals/Urinary Tract: Normal adrenal glands. Enlargement and cystic replacement of the right kidney with innumerable cysts of varying sizes. Appearing density and  some with thin calcifications. Overall appearance is unchanged from prior exam. The right ureter is nondilated. There is prominence of the left renal pelvis in ureter with left perinephric edema. No urolithiasis. Exophytic cyst from the lower left kidney is unchanged. Urinary bladder is completely decompressed. Stomach/Bowel: Stomach is physiologically distended without wall thickening. No small bowel dilatation, inflammation or obstruction. Normal appendix. Multifocal colonic diverticulosis throughout the entire colon. Possible early pericolonic inflammation and wall thickening at the distal descending colon. No perforation. No abscess. Vascular/Lymphatic: Aortic and branch atherosclerosis. Aneurysmal dilatation of the infrarenal aorta maximal dimension 3.9 cm, unchanged from prior exam. No periaortic soft tissue stranding to suggest rupture. Enlarged right external iliac node measures 2.2 cm series 2, image 83, unchanged from prior exam. Additional prominent and enlarged pelvic and retroperitoneal nodes are also stable. No progressive lymphadenopathy. Reproductive: Normal sized prostate gland. Other: Fat within both inguinal canals. Small fat containing supraumbilical ventral abdominal wall hernia contains only fat. No free air, free fluid, or intra-abdominal fluid collection. Musculoskeletal: Again seen degenerative change in the spine. Probable hemangioma within L1 vertebral body. Chronic deformity of the right anterior iliac crest. IMPRESSION: 1. Multifocal colonic diverticulosis. Possible early uncomplicated acute diverticulitis in the distal descending colon. 2. Prominence of the left ureter and renal collecting system with increased perinephric edema, suspicious for urinary tract infection. No urolithiasis. 3. Polycystic disease of the right kidney, stable in noncontrast CT appearance. 4. Aortic atherosclerosis with infrarenal abdominal aortic aneurysm measuring 3.9 x 3.8 cm. Recommend followup by ultrasound  in 2 years. This recommendation follows ACR consensus guidelines: White Paper of the ACR Incidental Findings Committee II on Vascular Findings. J Am Coll Radiol 2013; 10:789-794. 5. Hepatic steatosis. 6. Stable enlarged pelvic and retroperitoneal nodes. Electronically Signed   By: Jeb Levering M.D.   On: 04/09/2017 19:59     Medications:   .  sodium bicarbonate  infusion 1000 mL 75 mL/hr at 04/11/17 0833   . aspirin  81 mg Oral Daily  . atenolol  50 mg Oral Daily  . atorvastatin  10 mg Oral q1800  . ciprofloxacin  500 mg Oral Q24H  . clonazePAM  1 mg Oral QHS  . heparin  5,000 Units Subcutaneous Q8H  . insulin aspart  0-5 Units Subcutaneous QHS  . insulin aspart  0-9 Units Subcutaneous TID WC  . metroNIDAZOLE  500 mg Oral Q8H  . tamsulosin  0.4 mg Oral Daily   acetaminophen **OR** acetaminophen, LORazepam, ondansetron **OR** ondansetron (ZOFRAN) IV, oxyCODONE, promethazine  Assessment/ Plan:  [name] is a 83 y.o. white male with hypertension, polycystic kidney disease, hyperlipidemia, diabetes mellitus type II insulin dependent, gout, BPH, allergic rhinitis, CLL, AAA, obstructive sleep apnea.   1. 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  Baseline creatinine of 1.5, GFR of 45 from 12/12/16. 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  -  patient will need temporary dialysis. Consult vascular.  - renally dose all medications.  - Discussed with patient and daughter  - [facility] input.   2. Hypertension: blood pressure at goal.  - atenolol, tamsulosin  3. Hematuria: recent outpatient work up for hematuria. Possible urinary tract infection and/or nephrolithiasis.   4. Diabetes mellitus type II: with chronic kidney disease: insulin dependent. Hemoglobin A1c 7% on 3/5. - continue glucose control.     LOS: H[place]man, Patrick Payne 7/3/201810:44 AM

## 2017-04-11 NOTE — Anesthesia Preprocedure Evaluation (Signed)
Anesthesia Evaluation  Patient identified by MRN, date of birth, ID band Patient awake    Reviewed: Allergy & Precautions, H&P , NPO status , Patient's Chart, lab work & pertinent test results, reviewed documented beta blocker date and time   History of Anesthesia Complications Negative for: history of anesthetic complications  Airway Mallampati: III  TM Distance: >3 FB Neck ROM: full    Dental  (+) Caps, Dental Advidsory Given, Missing   Pulmonary neg shortness of breath, sleep apnea and Continuous Positive Airway Pressure Ventilation , neg COPD, neg recent URI, former smoker,           Cardiovascular Exercise Tolerance: Good hypertension, (-) angina+ Peripheral Vascular Disease  (-) CAD, (-) Past MI, (-) Cardiac Stents and (-) CABG (-) dysrhythmias (-) Valvular Problems/Murmurs     Neuro/Psych negative neurological ROS  negative psych ROS   GI/Hepatic negative GI ROS, Neg liver ROS,   Endo/Other  diabetes, Well Controlled, Insulin Dependent  Renal/GU CRFRenal disease  negative genitourinary   Musculoskeletal   Abdominal   Peds  Hematology  (+) Blood dyscrasia (CLL), ,   Anesthesia Other Findings Past Medical History: No date: CKD (chronic kidney disease), stage III No date: Diabetes mellitus without complication (HCC) No date: HLD (hyperlipidemia) No date: HTN (hypertension) No date: PKD (polycystic kidney disease)   Reproductive/Obstetrics negative OB ROS                             Anesthesia Physical Anesthesia Plan  ASA: III  Anesthesia Plan: General ETT, Cricoid Pressure and Rapid Sequence   Post-op Pain Management:    Induction: Intravenous, Rapid sequence and Cricoid pressure planned  PONV Risk Score and Plan: 2 and Ondansetron and Dexamethasone  Airway Management Planned: Oral ETT  Additional Equipment:   Intra-op Plan:   Post-operative Plan: Extubation in  OR  Informed Consent: I have reviewed the patients History and Physical, chart, labs and discussed the procedure including the risks, benefits and alternatives for the proposed anesthesia with the patient or authorized representative who has indicated his/her understanding and acceptance.   Dental Advisory Given  Plan Discussed with: Anesthesiologist, CRNA and Surgeon  Anesthesia Plan Comments:         Anesthesia Quick Evaluation

## 2017-04-11 NOTE — Op Note (Signed)
  OPERATIVE NOTE   PROCEDURE: 1. Insertion of temporary dialysis catheter catheter right femoral vein approach.  PRE-OPERATIVE DIAGNOSIS: Acute on chronic renal sufficiency requiring hemodialysis  POST-OPERATIVE DIAGNOSIS: Same  SURGEON: Katha Cabal M.D.  ANESTHESIA: 1% lidocaine local infiltration  ESTIMATED BLOOD LOSS: Minimal cc  INDICATIONS:   Patrick Payne is a 83 y.o. male who presents with acute on chronic renal disease he now requires what his hope to be temporary hemodialysis. He is therefore undergoing placement of a temperature catheter given his platelet count of 57 femoral approach will be utilized.  DESCRIPTION: After obtaining full informed written consent, the patient was positioned supine. The right groin was prepped and draped in a sterile fashion. Ultrasound was placed in a sterile sleeve. Ultrasound was utilized to identify the right femoral vein which is noted to be echolucent and compressible indicating patency. Images recorded for the permanent record. Under real-time visualization a Seldinger needle is inserted into the vein and the guidewires advanced without difficulty. Small counterincision was made at the wire insertion site. Dilator is passed over the wire and the temporary dialysis catheter catheter is fed over the wire without difficulty.  All lumens aspirate and flush easily and are packed with heparin saline. Catheter secured to the skin of the right thigh with 2-0 silk. A sterile dressing is applied with Biopatch.  COMPLICATIONS: None  CONDITION: Unchanged  Hortencia Pilar Office:  [phone] 04/11/2017, 1:59 PM

## 2017-04-11 NOTE — Progress Notes (Signed)
[facility]                                                                                                                                                                                  Patient Demographics   Patrick Payne, is a 83 y.o. male, DOB - 09-11-1934, VPX:[id number]  Admit date - 04/09/2017   Admitting Physician Lance Coon, MD  Outpatient Primary MD for the patient is Sparks, [name], MD   LOS - 2  Subjective: Patient continues to be very nauseous and does not feel well does not want to eat    Review of Systems:   CONSTITUTIONAL: No documented fever. No fatigue, weakness. No weight gain, no weight loss.  EYES: No blurry or double vision.  ENT: No tinnitus. No postnasal drip. No redness of the oropharynx.  RESPIRATORY: No cough, no wheeze, no hemoptysis. No dyspnea.  CARDIOVASCULAR: No chest pain. No orthopnea. No palpitations. No syncope.  GASTROINTESTINAL: Positive nausea, no vomiting or diarrhea. No abdominal pain. No melena or hematochezia.  GENITOURINARY: No dysuria or hematuria.  ENDOCRINE: No polyuria or nocturia. No heat or cold intolerance.  HEMATOLOGY: No anemia. No bruising. No bleeding.  INTEGUMENTARY: No rashes. No lesions.  MUSCULOSKELETAL: No arthritis. No swelling. No gout.  NEUROLOGIC: No numbness, tingling, or ataxia. No seizure-type activity.  PSYCHIATRIC: No anxiety. No insomnia. No ADD.    Vitals:   Vitals:   04/10/17 0823 04/10/17 1322 04/10/17 2030 04/11/17 0513  BP: 126/61 (!) 120/54 (!) 132/54 136/62  Pulse: 62 65 68 66  Resp: 18 20 18 20   Temp: 97.8 F (36.6 C) 99.2 F (37.3 C) 100.2 F (37.9 C) 99.2 F (37.3 C)  TempSrc: Oral Oral Oral Oral  SpO2: 95% 96% 98% 94%  Weight:      Height:        Wt Readings from Last 3 Encounters:  04/09/17 245 lb (111.1 kg)  03/29/17 248 lb 8 oz (112.7 kg)  08/26/16 248 lb (112.5 kg)     Intake/Output Summary (Last 24 hours) at 04/11/17  1257 Last data filed at 04/11/17 0818  Gross per 24 hour  Intake          2149.99 ml  Output               50 ml  Net          2099.99 ml    Physical Exam:   GENERAL: Pleasant-appearing in no apparent distress.  HEAD, EYES, EARS, NOSE AND THROAT: Atraumatic, normocephalic. Extraocular muscles are intact. Pupils equal and reactive to light. Sclerae anicteric. No conjunctival injection. No oro-pharyngeal erythema.  NECK: Supple.  There is no jugular venous distention. No bruits, no lymphadenopathy, no thyromegaly.  HEART: Regular rate and rhythm,. No murmurs, no rubs, no clicks.  LUNGS: Clear to auscultation bilaterally. No rales or rhonchi. No wheezes.  ABDOMEN: Soft, flat, nontender, nondistended. Has good bowel sounds. No hepatosplenomegaly appreciated.  EXTREMITIES: No evidence of any cyanosis, clubbing, or 1+ peripheral edema.  +2 pedal and radial pulses bilaterally.  NEUROLOGIC: The patient is alert, awake, and oriented x3 with no focal motor or sensory deficits appreciated bilaterally.  SKIN: Moist and warm with no rashes appreciated.  Psych: Not anxious, depressed LN: No inguinal LN enlargement    Antibiotics   Anti-infectives    Start     Dose/Rate Route Frequency Ordered Stop   04/10/17 1800  ciprofloxacin (CIPRO) tablet 500 mg     500 mg Oral Every 24 hours 04/09/17 2351     04/09/17 2215  metroNIDAZOLE (FLAGYL) tablet 500 mg     500 mg Oral Every 8 hours 04/09/17 2201     04/09/17 1730  ciprofloxacin (CIPRO) IVPB 400 mg     400 mg 200 mL/hr over 60 Minutes Intravenous  Once 04/09/17 1727 04/09/17 2006   04/09/17 1730  metroNIDAZOLE (FLAGYL) IVPB 500 mg     500 mg 100 mL/hr over 60 Minutes Intravenous  Once 04/09/17 1727 04/09/17 1852      Medications   Scheduled Meds: . aspirin  81 mg Oral Daily  . atenolol  50 mg Oral Daily  . atorvastatin  10 mg Oral q1800  . ciprofloxacin  500 mg Oral Q24H  . clonazePAM  1 mg Oral QHS  . heparin  5,000 Units Subcutaneous  Q8H  . insulin aspart  0-5 Units Subcutaneous QHS  . insulin aspart  0-9 Units Subcutaneous TID WC  . metroNIDAZOLE  500 mg Oral Q8H  . tamsulosin  0.4 mg Oral Daily   Continuous Infusions: .  sodium bicarbonate  infusion 1000 mL 75 mL/hr at 04/11/17 0833   PRN Meds:.acetaminophen **OR** acetaminophen, LORazepam, ondansetron **OR** ondansetron (ZOFRAN) IV, oxyCODONE, promethazine   Data Review:   Micro Results Recent Results (from the past 240 hour(s))  Urine Culture     Status: Abnormal   Collection Time: 04/09/17  7:24 PM  Result Value Ref Range Status   Specimen Description URINE, RANDOM  Final   Special Requests NONE  Final   Culture (A)  Final    <10,000 COLONIES/mL INSIGNIFICANT GROWTH Performed at [facility], [address] [place], [place] [address]    Report Status 04/11/2017 FINAL  Final    Radiology Reports Ct Abdomen Pelvis Wo Contrast  Result Date: 04/09/2017 CLINICAL DATA:  Left lower quadrant pain.  Acute renal failure. EXAM: CT ABDOMEN AND PELVIS WITHOUT CONTRAST TECHNIQUE: Multidetector CT imaging of the abdomen and pelvis was performed following the standard protocol without IV contrast. COMPARISON:  CT 03/02/2017 FINDINGS: Lower chest: Linear atelectasis in the left lower lobe. No pleural effusion. Coronary artery calcifications are seen. Hepatobiliary: Decreased hepatic density consistent with steatosis. Small subcentimeter hypodensity in the right lobe is unchanged but incompletely characterized. Gallbladder is surgically absent, no biliary dilatation. Pancreas: Parenchymal atrophy. No ductal dilatation or inflammation. Spleen: Normal in size without focal abnormality. Adrenals/Urinary Tract: Normal adrenal glands. Enlargement and cystic replacement of the right kidney with innumerable cysts of varying sizes. Appearing density and some with thin calcifications. Overall appearance is unchanged from prior exam. The right ureter is nondilated. There is  prominence of the left  renal pelvis in ureter with left perinephric edema. No urolithiasis. Exophytic cyst from the lower left kidney is unchanged. Urinary bladder is completely decompressed. Stomach/Bowel: Stomach is physiologically distended without wall thickening. No small bowel dilatation, inflammation or obstruction. Normal appendix. Multifocal colonic diverticulosis throughout the entire colon. Possible early pericolonic inflammation and wall thickening at the distal descending colon. No perforation. No abscess. Vascular/Lymphatic: Aortic and branch atherosclerosis. Aneurysmal dilatation of the infrarenal aorta maximal dimension 3.9 cm, unchanged from prior exam. No periaortic soft tissue stranding to suggest rupture. Enlarged right external iliac node measures 2.2 cm series 2, image 83, unchanged from prior exam. Additional prominent and enlarged pelvic and retroperitoneal nodes are also stable. No progressive lymphadenopathy. Reproductive: Normal sized prostate gland. Other: Fat within both inguinal canals. Small fat containing supraumbilical ventral abdominal wall hernia contains only fat. No free air, free fluid, or intra-abdominal fluid collection. Musculoskeletal: Again seen degenerative change in the spine. Probable hemangioma within L1 vertebral body. Chronic deformity of the right anterior iliac crest. IMPRESSION: 1. Multifocal colonic diverticulosis. Possible early uncomplicated acute diverticulitis in the distal descending colon. 2. Prominence of the left ureter and renal collecting system with increased perinephric edema, suspicious for urinary tract infection. No urolithiasis. 3. Polycystic disease of the right kidney, stable in noncontrast CT appearance. 4. Aortic atherosclerosis with infrarenal abdominal aortic aneurysm measuring 3.9 x 3.8 cm. Recommend followup by ultrasound in 2 years. This recommendation follows ACR consensus guidelines: White Paper of the ACR Incidental Findings Committee  II on Vascular Findings. J Am Coll Radiol 2013; 10:789-794. 5. Hepatic steatosis. 6. Stable enlarged pelvic and retroperitoneal nodes. Electronically Signed   By: Jeb Levering M.D.   On: 04/09/2017 19:59   US Renal  Result Date: 04/11/2017 CLINICAL DATA:  Acute renal failure EXAM: RENAL / URINARY TRACT ULTRASOUND COMPLETE COMPARISON:  04/09/2017 FINDINGS: Right Kidney: Length: 23.3 cm. Changes consistent with polycystic kidney disease are noted. The largest of these cystic changes measures approximately 6.4 cm. No obstructive changes are noted. These changes are similar to that seen on recent CT. Left Kidney: Length: 14.4 cm. 1.8 cm cyst is noted within the left kidney. No obstructive changes are seen. Bladder: Bladder is decompressed IMPRESSION: Changes of polycystic disease within the right kidney. Single left renal cyst is noted. Electronically Signed   By: Inez Catalina M.D.   On: 04/11/2017 10:55     CBC  Recent Labs Lab 04/09/17 1446 04/10/17 0411  WBC 24.8* 13.3*  HGB 11.0* 9.5*  HCT 33.3* 29.0*  PLT 74* 57*  MCV 100.2* 100.0  MCH 33.1 32.8  MCHC 33.0 32.8  RDW 15.0* 15.1*    Chemistries   Recent Labs Lab 04/09/17 1446 04/10/17 0411 04/11/17 0628  NA 134* 133* 132*  K 4.5 4.3 4.4  CL 104 106 105  CO2 24 21* 17*  GLUCOSE 165* 127* 163*  BUN 45* 51* 61*  CREATININE 4.02* 4.89* 6.52*  CALCIUM 8.7* 7.5* 7.3*  AST 22  --   --   ALT 19  --   --   ALKPHOS 76  --   --   BILITOT 0.9  --   --    ------------------------------------------------------------------------------------------------------------------ estimated creatinine clearance is 10.7 mL/min (A) (by C-G formula based on SCr of 6.52 mg/dL (H)). ------------------------------------------------------------------------------------------------------------------ No results for input(s): HGBA1C in the last 72  hours. ------------------------------------------------------------------------------------------------------------------ No results for input(s): CHOL, HDL, LDLCALC, TRIG, CHOLHDL, LDLDIRECT in the last 72 hours. ------------------------------------------------------------------------------------------------------------------ No results for input(s): TSH, T4TOTAL, T3FREE, THYROIDAB in  the last 72 hours.  Invalid input(s): FREET3 ------------------------------------------------------------------------------------------------------------------ No results for input(s): VITAMINB12, FOLATE, FERRITIN, TIBC, IRON, RETICCTPCT in the last 72 hours.  Coagulation profile No results for input(s): INR, PROTIME in the last 168 hours.  No results for input(s): DDIMER in the last 72 hours.  Cardiac Enzymes No results for input(s): CKMB, TROPONINI, MYOGLOBIN in the last 168 hours.  Invalid input(s): CK ------------------------------------------------------------------------------------------------------------------ Invalid input(s): [place]  Patient is a 83 year old with acute on chronic renal failure 1. Acute on chronic renal failure [facility])  Per urology they do not seem to Appear as  obstructive in nature. Patient's renal function continues to worsen Urine output minimal Nephrology consult appreciated plans to start hemodialysis today Urology following  2. possible  Diverticulitis - seen on CT scan,  continue anabiotic  3.  Chronic lymphocytic leukemia (HCC) -WBC count going up and down check CBC in the morning  4.   Diabetes mellitus ([place]) - sliding scale insulin corresponding glucose checks Lantus on hold currently blood sugar acceptable  5, essential hypertension : mycardi and  HCTZ on hold due to acute renal failure continue atenolol  6. Misc: heparin for dvt proph All the records are reviewed and case discussed with ED provider. Management plans discussed with the  patient and/or family.     Code Status Orders        Start     Ordered   04/09/17 2202  Full code  Continuous     04/09/17 2201    Code Status History    Date Active Date Inactive Code Status Order ID Comments User Context   This patient has a current code status but no historical code status.    Advance Directive Documentation     Most Recent Value  Type of Advance Directive  Healthcare Power of Attorney, Living will  Pre-existing out of facility DNR order (yellow form or pink MOST form)  -  "MOST" Form in Place?  -           Consults  Urology and nephrology  DVT Prophylaxis  SCDs due to thrombocytopenia  Lab Results  Component Value Date   PLT 57 (L) 04/10/2017     Time Spent in minutes  72min  Greater than 50% of time spent in care coordination and counseling patient regarding the condition and plan of care.   Dustin Flock M.D on 04/11/2017 at 12:57 PM  Between 7am to 6pm - Pager - [phone]  After 6pm go to [email or web address] - password [email or web address] [facility]   Office  [phone]

## 2017-04-11 NOTE — Anesthesia Post-op Follow-up Note (Cosign Needed)
Anesthesia QCDR form completed.        

## 2017-04-11 NOTE — Progress Notes (Signed)
Pre HD  

## 2017-04-11 NOTE — Transfer of Care (Signed)
Immediate Anesthesia Transfer of Care Note  Patient: Patrick Payne  Procedure(s) Performed: Procedure(s): CYSTOSCOPY WITH STENT PLACEMENT (Left)  Patient Location: PACU  Anesthesia Type:General  Level of Consciousness: awake and alert   Airway & Oxygen Therapy: Patient Spontanous Breathing and Patient connected to face mask oxygen  Post-op Assessment: Report given to RN and Post -op Vital signs reviewed and stable  Post vital signs: Reviewed  Last Vitals:  Vitals:   04/11/17 1616 04/11/17 1916  BP: (!) 149/61 134/71  Pulse: 71 86  Resp: 18 15  Temp: 37.2 C     Last Pain:  Vitals:   04/11/17 1616  TempSrc: Oral  PainSc:          Complications: No apparent anesthesia complications

## 2017-04-11 NOTE — Progress Notes (Signed)
HD initiated via new R Fem HD catheter without issue. Procedure explained and all questions answered prior to initiation. Consents obtained as well. 2 hour treatment and no UF as ordered. 3K bath.

## 2017-04-11 NOTE — Op Note (Signed)
Preoperative diagnosis: Right polycystic kidney, acute renal failure, anuria Postoperative diagnosis: Right polycystic kidney, acute renal failure, anuria, left pyonephrosis  Procedure: Cystoscopy left retrograde pyelogram and left ureteral stent placement, Foley catheter placement  Surgeon: Daelynn Blower  Anesthesia: Gen.  Indication for procedure: 83 year old with essentially a solitary left kidney. His right kidney is an enlarged polycystic kidney and known to not function well. He has a baseline creatinine that can be as high as 2 but recently began to feel poorly and the creatinine climbed to 4 mL 6.5 and the skin and ureter. There was some possible dilation of the collecting system on CT but no obvious hide on follow-up ultrasound. Given this continual decline I discussed with the patient, his family and nephrology the nature risk benefits and alternatives to ureteral stent and the patient elected to proceed.  Findings: On exam under anesthesia the penis is circumcised and without mass or lesion. The testicles were descended bilaterally and palpably normal. On digital rectal exam the prostate was about 20 g, smooth without hard area or nodule.  On cystoscopy the urethra appeared normal, the prostate was short and nonobstructing. The bladder appeared normal without stone or foreign body. There was no significant trabeculation. I did note clear efflux from the right ureteral orifice and no efflux from the left. After withdrawing the 5 [place] open-ended catheter from the ureteral orifice, the ureteral orifice began to billow out thick cloudy dark urine with a lot of debris.  Left retrograde pyelogram-this outlined a single ureter single collecting system unit. The distal and mid ureter appeared normal. Proximal to the left iliac there was mild dilation of the ureter and collecting system. I did not see a specific filling defect.  Description of procedure: After consent was obtained patient brought to  the operating room. After adequate anesthesia he was placed in lithotomy position and prepped and draped in the usual sterile fashion. A timeout was performed to confirm the patient and procedure. I did an exam under anesthesia. The cystoscope was then passed per urethra and the bladder carefully inspected. I then cannulized the left ureteral orifice with the 5 [place] open-ended catheter. On scout imaging I noted no stone on the left or the right. After injection of contrast the catheter was withdrawn and there was a billowing out of dark cloudy urine. I then passed a Glidewire and coiled in the upper pole collecting system and over the wire passed a 6 x 26 cm ureteral stent. The wire was removed with a good coil seen in the upper pole collecting system and a good coil in the bladder. The stent drained copious amount of this dark, cloudy and debris-filled urine. I sent some of this for culture. The scope was withdrawn and a 16 [place] Foley was placed and left gravity drainage to monitor urine output. The patient was awakened and taken to the recovery room in stable condition.  Complications: None  Blood loss: Minimal  Specimens: Catheterized bladder specimen for culture (from the urine drained from the stent).   Drains: 6 x 26 cm left ureteral stent, 16 [place] Foley catheter  Disposition: Patient stable to PACU

## 2017-04-11 NOTE — Progress Notes (Signed)
Pre dialysis assessment 

## 2017-04-11 NOTE — Progress Notes (Signed)
Post HD assessment unchanged. Patient denies pain/complaints.

## 2017-04-11 NOTE — Anesthesia Procedure Notes (Signed)
Procedure Name: Intubation Performed by: Rolla Plate Pre-anesthesia Checklist: Patient identified, Patient being monitored, Timeout performed, Emergency Drugs available and Suction available Patient Re-evaluated:Patient Re-evaluated prior to inductionOxygen Delivery Method: Circle system utilized Preoxygenation: Pre-oxygenation with 100% oxygen Intubation Type: IV induction, Rapid sequence and Cricoid Pressure applied Laryngoscope Size: 4 and McGraph Grade View: Grade I Tube type: Oral Tube size: 7.5 mm Number of attempts: 1 Airway Equipment and Method: Stylet and Video-laryngoscopy Placement Confirmation: ETT inserted through vocal cords under direct vision,  positive ETCO2 and breath sounds checked- equal and bilateral Secured at: 23 cm Tube secured with: Tape Dental Injury: Teeth and Oropharynx as per pre-operative assessment  Difficulty Due To: Difficulty was anticipated, Difficult Airway- due to reduced neck mobility, Difficult Airway- due to large tongue and Difficult Airway- due to limited oral opening Future Recommendations: Recommend- induction with short-acting agent, and alternative techniques readily available

## 2017-04-11 NOTE — Consult Note (Signed)
[name] Vascular Consult Note  MRN : [id number]  Patrick Payne is a 83 y.o. (1934/04/14) male who presents with chief complaint of  Chief Complaint  Patient presents with  . Abdominal Pain  .  History of Present Illness: I am asked to evaluate the patient by Dr. Assunta Gambles for dialysis access.  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 He was also complaining of bloody urine. The pain and hematuria were abrupt in onset occurring proximally 1 week ago area initially thought it was a recurrent diverticulitis attack however he noted a marked decrease in urination the day prior to coming to the emergency room.  On admission he did admit he has been drinking very poorly over the past few days.  In the emergency room he was found to have a sodium of 133 and BUN of 51 with a creatinine of 4.89. He was felt to be dehydrated and in acute on chronic renal failure.  He has known polycystic right kidney.  Given these findings he will be initiated on was hoped to be temporary dialysis and therefore needs adequate dialysis access.  Patient has long-standing CLL and chronic thrombocytopenia. Today his platelet count is 57.  Current Facility-Administered Medications  Medication Dose Route Frequency Provider Last Rate Last Dose  . acetaminophen (TYLENOL) tablet 650 mg  650 mg Oral Q6H PRN Lance Coon, MD       Or  . acetaminophen (TYLENOL) suppository 650 mg  650 mg Rectal Q6H PRN Lance Coon, MD      . aspirin chewable tablet 81 mg  81 mg Oral Daily Lance Coon, MD   81 mg at 04/10/17 0827  . atenolol (TENORMIN) tablet 50 mg  50 mg Oral Daily Lance Coon, MD   50 mg at 04/10/17 0827  . atorvastatin (LIPITOR) tablet 10 mg  10 mg Oral q1800 Lance Coon, MD   10 mg at 04/10/17 1721  . ciprofloxacin (CIPRO) tablet 500 mg  500 mg Oral Q24H Lance Coon, MD   500 mg at 04/10/17 1721  .  clonazePAM (KLONOPIN) tablet 1 mg  1 mg Oral Corwin Levins, MD   1 mg at 04/10/17 2117  . heparin injection 5,000 Units  5,000 Units Subcutaneous Q8H Lance Coon, MD      . insulin aspart (novoLOG) injection 0-5 Units  0-5 Units Subcutaneous QHS Lance Coon, MD      . insulin aspart (novoLOG) injection 0-9 Units  0-9 Units Subcutaneous TID [facility] Lance Coon, MD   2 Units at 04/11/17 [phone]  . LORazepam (ATIVAN) tablet 1 mg  1 mg Oral TID PRN Lance Coon, MD      . metroNIDAZOLE (FLAGYL) tablet 500 mg  500 mg Oral Driscilla Moats, MD   500 mg at 04/11/17 0543  . ondansetron (ZOFRAN) tablet 4 mg  4 mg Oral Q6H PRN Lance Coon, MD       Or  . ondansetron [facility]) injection 4 mg  4 mg Intravenous Q6H PRN Lance Coon, MD   4 mg at 04/11/17 0839  . oxyCODONE (Oxy IR/ROXICODONE) immediate release tablet 5 mg  5 mg Oral Q4H PRN Lance Coon, MD   5 mg at 04/10/17 2128  . promethazine (PHENERGAN) injection 12.5 mg  12.5 mg Intravenous Q6H PRN Dustin Flock, MD   12.5 mg at 04/11/17 0959  . sodium bicarbonate 150 mEq in dextrose 5 % 1,000  mL infusion   Intravenous Continuous Lavonia Dana, MD 75 mL/hr at 04/11/17 7209    . tamsulosin (FLOMAX) capsule 0.4 mg  0.4 mg Oral Daily Lance Coon, MD   0.4 mg at 04/10/17 0827    Past Medical History:  Diagnosis Date  . CKD (chronic kidney disease), stage III   . Diabetes mellitus without complication ([place])   . HLD (hyperlipidemia)   . HTN (hypertension)   . PKD (polycystic kidney disease)     Past Surgical History:  Procedure Laterality Date  . CARDIAC CATHETERIZATION  12/1987  . CHOLECYSTECTOMY      Social History Social History  Substance Use Topics  . Smoking status: Former Research scientist (life sciences)  . Smokeless tobacco: Never Used     Comment: quit in Feb of 1997  . Alcohol use No    Family History Family History  Problem Relation Age of Onset  . Cancer Mother   . Varicose Veins Mother   . Cancer Father   No family history of  bleeding/clotting disorders, porphyria or autoimmune disease   Allergies  Allergen Reactions  . Celecoxib Other (See Comments)    Increased Blood Pressure  . Chlorpromazine Nausea Only  . Meloxicam Other (See Comments)    Hypertension  . Prednisone Other (See Comments)    Diabetic  . Amoxicillin-Pot Clavulanate Rash  . Penicillin V Potassium Rash     REVIEW OF SYSTEMS (Negative unless checked)  Constitutional: [] Weight loss  [] Fever  [] Chills Cardiac: [] Chest pain   [] Chest pressure   [] Palpitations   [x] Shortness of breath when laying flat   [] Shortness of breath at rest   [x] Shortness of breath with exertion. Vascular:  [] Pain in legs with walking   [] Pain in legs at rest   [] Pain in legs when laying flat   [] Claudication   [] Pain in feet when walking  [] Pain in feet at rest  [] Pain in feet when laying flat   [] History of DVT   [] Phlebitis   [] Swelling in legs   [] Varicose veins   [] Non-healing ulcers Pulmonary:   [] Uses home oxygen   [] Productive cough   [] Hemoptysis   [] Wheeze  [] COPD   [] Asthma Neurologic:  [] Dizziness  [] Blackouts   [] Seizures   [] History of stroke   [] History of TIA  [] Aphasia   [] Temporary blindness   [] Dysphagia   [] Weakness or numbness in arms   [] Weakness or numbness in legs Musculoskeletal:  [] Arthritis   [] Joint swelling   [] Joint pain   [] Low back pain Hematologic:  [x] Easy bruising  [] Easy bleeding   [] Hypercoagulable state   [] Anemic  [] Hepatitis Gastrointestinal:  [] Blood in stool   [] Vomiting blood  [] Gastroesophageal reflux/heartburn   [] Difficulty swallowing. Genitourinary:  [x] Chronic kidney disease   [] Difficult urination  [] Frequent urination  [] Burning with urination   [x] Blood in urine Skin:  [] Rashes   [] Ulcers   [] Wounds Psychological:  [] History of anxiety   []  History of major depression.  Physical Examination  Vitals:   04/10/17 0823 04/10/17 1322 04/10/17 2030 04/11/17 0513  BP: 126/61 (!) 120/54 (!) 132/54 136/62  Pulse: 62 65 68 66   Resp: 18 20 18 20   Temp: 97.8 F (36.6 C) 99.2 F (37.3 C) 100.2 F (37.9 C) 99.2 F (37.3 C)  TempSrc: Oral Oral Oral Oral  SpO2: 95% 96% 98% 94%  Weight:      Height:       Body mass index is 38.37 kg/m. Gen:  WD/WN, NAD Head: [place]/AT, No temporalis wasting. Prominent temp  pulse not noted. Ear/Nose/Throat: Hearing grossly intact, nares w/o erythema or drainage, oropharynx w/o Erythema/Exudate Eyes: Sclera non-icteric, conjunctiva clear Neck: Trachea midline.  No JVD.  Pulmonary:  Good air movement, respirations not labored, equal bilaterally.  Cardiac: RRR, normal S1, S2. Vascular:  Vessel Right Left  Radial Palpable Palpable  Brachial Palpable Palpable  Femoral Palpable Palpable  Gastrointestinal: soft, non-tender/non-distended. No guarding/reflex.  Musculoskeletal: M/S 5/5 throughout.  Extremities without ischemic changes.  No deformity or atrophy. No edema. Neurologic: Sensation grossly intact in extremities.  Symmetrical.  Speech is fluent. Motor exam as listed above. Psychiatric: Judgment intact, Mood & affect appropriate for pt's clinical situation. Dermatologic: No rashes or ulcers noted.  No cellulitis or open wounds. Lymph : No Cervical, Axillary, or Inguinal lymphadenopathy.    CBC Lab Results  Component Value Date   WBC 13.3 (H) 04/10/2017   HGB 9.5 (L) 04/10/2017   HCT 29.0 (L) 04/10/2017   MCV 100.0 04/10/2017   PLT 57 (L) 04/10/2017    BMET    Component Value Date/Time   NA 132 (L) 04/11/2017 0628   K 4.4 04/11/2017 0628   CL 105 04/11/2017 0628   CO2 17 (L) 04/11/2017 0628   GLUCOSE 163 (H) 04/11/2017 0628   BUN 61 (H) 04/11/2017 0628   CREATININE 6.52 (H) 04/11/2017 0628   CREATININE 1.57 (H) 02/21/2014 1340   CALCIUM 7.3 (L) 04/11/2017 0628   GFRNONAA 7 (L) 04/11/2017 0628   GFRNONAA 42 (L) 02/21/2014 1340   GFRAA 8 (L) 04/11/2017 0628   GFRAA 49 (L) 02/21/2014 1340   Estimated Creatinine Clearance: 10.7 mL/min (A) (by C-G formula based  on SCr of 6.52 mg/dL (H)).  COAG No results found for: INR, PROTIME  Radiology Ct Abdomen Pelvis Wo Contrast  Result Date: 04/09/2017 CLINICAL DATA:  Left lower quadrant pain.  Acute renal failure. EXAM: CT ABDOMEN AND PELVIS WITHOUT CONTRAST TECHNIQUE: Multidetector CT imaging of the abdomen and pelvis was performed following the standard protocol without IV contrast. COMPARISON:  CT 03/02/2017 FINDINGS: Lower chest: Linear atelectasis in the left lower lobe. No pleural effusion. Coronary artery calcifications are seen. Hepatobiliary: Decreased hepatic density consistent with steatosis. Small subcentimeter hypodensity in the right lobe is unchanged but incompletely characterized. Gallbladder is surgically absent, no biliary dilatation. Pancreas: Parenchymal atrophy. No ductal dilatation or inflammation. Spleen: Normal in size without focal abnormality. Adrenals/Urinary Tract: Normal adrenal glands. Enlargement and cystic replacement of the right kidney with innumerable cysts of varying sizes. Appearing density and some with thin calcifications. Overall appearance is unchanged from prior exam. The right ureter is nondilated. There is prominence of the left renal pelvis in ureter with left perinephric edema. No urolithiasis. Exophytic cyst from the lower left kidney is unchanged. Urinary bladder is completely decompressed. Stomach/Bowel: Stomach is physiologically distended without wall thickening. No small bowel dilatation, inflammation or obstruction. Normal appendix. Multifocal colonic diverticulosis throughout the entire colon. Possible early pericolonic inflammation and wall thickening at the distal descending colon. No perforation. No abscess. Vascular/Lymphatic: Aortic and branch atherosclerosis. Aneurysmal dilatation of the infrarenal aorta maximal dimension 3.9 cm, unchanged from prior exam. No periaortic soft tissue stranding to suggest rupture. Enlarged right external iliac node measures 2.2 cm  series 2, image 83, unchanged from prior exam. Additional prominent and enlarged pelvic and retroperitoneal nodes are also stable. No progressive lymphadenopathy. Reproductive: Normal sized prostate gland. Other: Fat within both inguinal canals. Small fat containing supraumbilical ventral abdominal wall hernia contains only fat. No free air, free fluid, or  intra-abdominal fluid collection. Musculoskeletal: Again seen degenerative change in the spine. Probable hemangioma within L1 vertebral body. Chronic deformity of the right anterior iliac crest. IMPRESSION: 1. Multifocal colonic diverticulosis. Possible early uncomplicated acute diverticulitis in the distal descending colon. 2. Prominence of the left ureter and renal collecting system with increased perinephric edema, suspicious for urinary tract infection. No urolithiasis. 3. Polycystic disease of the right kidney, stable in noncontrast CT appearance. 4. Aortic atherosclerosis with infrarenal abdominal aortic aneurysm measuring 3.9 x 3.8 cm. Recommend followup by ultrasound in 2 years. This recommendation follows ACR consensus guidelines: White Paper of the ACR Incidental Findings Committee II on Vascular Findings. J Am Coll Radiol 2013; 10:789-794. 5. Hepatic steatosis. 6. Stable enlarged pelvic and retroperitoneal nodes. Electronically Signed   By: Jeb Levering M.D.   On: 04/09/2017 19:59   US Renal  Result Date: 04/11/2017 CLINICAL DATA:  Acute renal failure EXAM: RENAL / URINARY TRACT ULTRASOUND COMPLETE COMPARISON:  04/09/2017 FINDINGS: Right Kidney: Length: 23.3 cm. Changes consistent with polycystic kidney disease are noted. The largest of these cystic changes measures approximately 6.4 cm. No obstructive changes are noted. These changes are similar to that seen on recent CT. Left Kidney: Length: 14.4 cm. 1.8 cm cyst is noted within the left kidney. No obstructive changes are seen. Bladder: Bladder is decompressed IMPRESSION: Changes of polycystic  disease within the right kidney. Single left renal cyst is noted. Electronically Signed   By: Inez Catalina M.D.   On: 04/11/2017 10:55      Assessment/Plan 1. Acute on chronic renal insufficiency:  Patient will be initiated on hemodialysis today hopefully this will be temporary. Given this fact as well as his low platelet count a temporary femoral line will be placed for these reasons. The rest benefits been reviewed with the patient all questions are answered patient has agreed to proceed. 2. CLL: Continue to monitor her daily his platelet count should he need a tunneled catheter he would need a platelet infusion unless his platelet count was above 70,000. 3. Hypertension:  Continue current oral medications as his blood pressure is under good control. No changes are present. 4.  Diabetes mellitus type 2Continue hypoglycemic medications as already ordered, these medications have been reviewed and there are no changes at this time.  Hgb A1C to be monitored as already arranged by primary service    Hortencia Pilar, MD  04/11/2017 1:46 PM    This note was created with Dragon medical transcription system.  Any error is purely unintentional

## 2017-04-11 NOTE — Progress Notes (Signed)
Urology Consult Follow Up  Subjective: Patient found in the bathroom.  States he was only able to urinate a few dribbles.  No flank pain.    VSS low grade fever  Serum creatinine continues to climb, now at 6.52.  Dialysis to begin today.     Anti-infectives: Anti-infectives    Start     Dose/Rate Route Frequency Ordered Stop   04/10/17 1800  ciprofloxacin (CIPRO) tablet 500 mg     500 mg Oral Every 24 hours 04/09/17 2351     04/09/17 2215  metroNIDAZOLE (FLAGYL) tablet 500 mg     500 mg Oral Every 8 hours 04/09/17 2201     04/09/17 1730  ciprofloxacin (CIPRO) IVPB 400 mg     400 mg 200 mL/hr over 60 Minutes Intravenous  Once 04/09/17 1727 04/09/17 2006   04/09/17 1730  metroNIDAZOLE (FLAGYL) IVPB 500 mg     500 mg 100 mL/hr over 60 Minutes Intravenous  Once 04/09/17 1727 04/09/17 1852      Current Facility-Administered Medications  Medication Dose Route Frequency Provider Last Rate Last Dose  . acetaminophen (TYLENOL) tablet 650 mg  650 mg Oral Q6H PRN Lance Coon, MD       Or  . acetaminophen (TYLENOL) suppository 650 mg  650 mg Rectal Q6H PRN Lance Coon, MD      . aspirin chewable tablet 81 mg  81 mg Oral Daily Lance Coon, MD   81 mg at 04/10/17 0827  . atenolol (TENORMIN) tablet 50 mg  50 mg Oral Daily Lance Coon, MD   50 mg at 04/10/17 0827  . atorvastatin (LIPITOR) tablet 10 mg  10 mg Oral q1800 Lance Coon, MD   10 mg at 04/10/17 1721  . ciprofloxacin (CIPRO) tablet 500 mg  500 mg Oral Q24H Lance Coon, MD   500 mg at 04/10/17 1721  . clonazePAM (KLONOPIN) tablet 1 mg  1 mg Oral Corwin Levins, MD   1 mg at 04/10/17 2117  . heparin injection 5,000 Units  5,000 Units Subcutaneous Q8H Lance Coon, MD      . insulin aspart (novoLOG) injection 0-5 Units  0-5 Units Subcutaneous QHS Lance Coon, MD      . insulin aspart (novoLOG) injection 0-9 Units  0-9 Units Subcutaneous TID [facility] Lance Coon, MD   2 Units at 04/10/17 1650  . LORazepam (ATIVAN) tablet 1  mg  1 mg Oral TID PRN Lance Coon, MD      . metroNIDAZOLE (FLAGYL) tablet 500 mg  500 mg Oral Driscilla Moats, MD   500 mg at 04/11/17 0543  . ondansetron (ZOFRAN) tablet 4 mg  4 mg Oral Q6H PRN Lance Coon, MD       Or  . ondansetron [facility]) injection 4 mg  4 mg Intravenous Q6H PRN Lance Coon, MD   4 mg at 04/11/17 0200  . oxyCODONE (Oxy IR/ROXICODONE) immediate release tablet 5 mg  5 mg Oral Q4H PRN Lance Coon, MD   5 mg at 04/10/17 2128  . sodium bicarbonate 150 mEq in dextrose 5 % 1,000 mL infusion   Intravenous Continuous Kolluru, Sarath, MD      . tamsulosin (FLOMAX) capsule 0.4 mg  0.4 mg Oral Daily Lance Coon, MD   0.4 mg at 04/10/17 0827     Objective: Vital signs in last 24 hours: Temp:  [97.8 F (36.6 C)-100.2 F (37.9 C)] 99.2 F (37.3 C) (07/03 0513) Pulse Rate:  [62-68] 66 (07/03 0513) Resp:  [18-20]  20 (07/03 0513) BP: (120-136)/(54-62) 136/62 (07/03 0513) SpO2:  [94 %-98 %] 94 % (07/03 0513)  Intake/Output from previous day: 07/02 0701 - 07/03 0700 In: 3174.6 [P.O.:1000; I.V.:2174.6] Out: -  Intake/Output this shift: No intake/output data recorded.   Physical Exam Constitutional: Well nourished. Alert and oriented, No acute distress. HEENT: [place] AT, moist mucus membranes. Trachea midline, no masses. Cardiovascular: No clubbing, cyanosis, or edema. Respiratory: Normal respiratory effort, no increased work of breathing. GI: Abdomen is soft, non tender, non distended, no abdominal masses. Liver and spleen not palpable.  No hernias appreciated.  Stool sample for occult testing is not indicated.   GU: No CVA tenderness.  No bladder fullness or masses.   Skin: No rashes, bruises or suspicious lesions. Lymph: No cervical or inguinal adenopathy. Neurologic: Grossly intact, no focal deficits, moving all 4 extremities. Psychiatric: Normal mood and affect.  Lab Results:   Recent Labs  04/09/17 1446 04/10/17 0411  WBC 24.8* 13.3*  HGB 11.0* 9.5*   HCT 33.3* 29.0*  PLT 74* 57*   BMET  Recent Labs  04/10/17 0411 04/11/17 0628  NA 133* 132*  K 4.3 4.4  CL 106 105  CO2 21* 17*  GLUCOSE 127* 163*  BUN 51* 61*  CREATININE 4.89* 6.52*  CALCIUM 7.5* 7.3*   PT/INR No results for input(s): LABPROT, INR in the last 72 hours. ABG No results for input(s): PHART, HCO3 in the last 72 hours.  Invalid input(s): PCO2, PO2  Studies/Results: Ct Abdomen Pelvis Wo Contrast  Result Date: 04/09/2017 CLINICAL DATA:  Left lower quadrant pain.  Acute renal failure. EXAM: CT ABDOMEN AND PELVIS WITHOUT CONTRAST TECHNIQUE: Multidetector CT imaging of the abdomen and pelvis was performed following the standard protocol without IV contrast. COMPARISON:  CT 03/02/2017 FINDINGS: Lower chest: Linear atelectasis in the left lower lobe. No pleural effusion. Coronary artery calcifications are seen. Hepatobiliary: Decreased hepatic density consistent with steatosis. Small subcentimeter hypodensity in the right lobe is unchanged but incompletely characterized. Gallbladder is surgically absent, no biliary dilatation. Pancreas: Parenchymal atrophy. No ductal dilatation or inflammation. Spleen: Normal in size without focal abnormality. Adrenals/Urinary Tract: Normal adrenal glands. Enlargement and cystic replacement of the right kidney with innumerable cysts of varying sizes. Appearing density and some with thin calcifications. Overall appearance is unchanged from prior exam. The right ureter is nondilated. There is prominence of the left renal pelvis in ureter with left perinephric edema. No urolithiasis. Exophytic cyst from the lower left kidney is unchanged. Urinary bladder is completely decompressed. Stomach/Bowel: Stomach is physiologically distended without wall thickening. No small bowel dilatation, inflammation or obstruction. Normal appendix. Multifocal colonic diverticulosis throughout the entire colon. Possible early pericolonic inflammation and wall thickening  at the distal descending colon. No perforation. No abscess. Vascular/Lymphatic: Aortic and branch atherosclerosis. Aneurysmal dilatation of the infrarenal aorta maximal dimension 3.9 cm, unchanged from prior exam. No periaortic soft tissue stranding to suggest rupture. Enlarged right external iliac node measures 2.2 cm series 2, image 83, unchanged from prior exam. Additional prominent and enlarged pelvic and retroperitoneal nodes are also stable. No progressive lymphadenopathy. Reproductive: Normal sized prostate gland. Other: Fat within both inguinal canals. Small fat containing supraumbilical ventral abdominal wall hernia contains only fat. No free air, free fluid, or intra-abdominal fluid collection. Musculoskeletal: Again seen degenerative change in the spine. Probable hemangioma within L1 vertebral body. Chronic deformity of the right anterior iliac crest. IMPRESSION: 1. Multifocal colonic diverticulosis. Possible early uncomplicated acute diverticulitis in the distal descending colon. 2. Prominence of  the left ureter and renal collecting system with increased perinephric edema, suspicious for urinary tract infection. No urolithiasis. 3. Polycystic disease of the right kidney, stable in noncontrast CT appearance. 4. Aortic atherosclerosis with infrarenal abdominal aortic aneurysm measuring 3.9 x 3.8 cm. Recommend followup by ultrasound in 2 years. This recommendation follows ACR consensus guidelines: White Paper of the ACR Incidental Findings Committee II on Vascular Findings. J Am Coll Radiol 2013; 10:789-794. 5. Hepatic steatosis. 6. Stable enlarged pelvic and retroperitoneal nodes. Electronically Signed   By: Jeb Levering M.D.   On: 04/09/2017 19:59     Assessment and Plan 1. Mild left hydronephrosis with AKI  - continue monitoring  - if no improvement with dialysis would recommend repeat imaging  - urine culture is still pending   LOS: 2 days    [facility] 04/11/2017

## 2017-04-11 NOTE — Progress Notes (Signed)
Patient tolerated HD well without issue. No UF as ordered per MD. Report given to Barbee Shropshire RN. Catheter capped clamped and taped after instilling heparin dwells. Dressing clean dry and intact. No drainage.

## 2017-04-12 DIAGNOSIS — N136 Pyonephrosis: Secondary | ICD-10-CM

## 2017-04-12 DIAGNOSIS — R34 Anuria and oliguria: Secondary | ICD-10-CM

## 2017-04-12 DIAGNOSIS — R110 Nausea: Secondary | ICD-10-CM

## 2017-04-12 DIAGNOSIS — N179 Acute kidney failure, unspecified: Secondary | ICD-10-CM

## 2017-04-12 LAB — GLUCOSE, CAPILLARY
GLUCOSE-CAPILLARY: 148 mg/dL — AB (ref 65–99)
GLUCOSE-CAPILLARY: 183 mg/dL — AB (ref 65–99)
GLUCOSE-CAPILLARY: 147 mg/dL — AB (ref 65–99)
GLUCOSE-CAPILLARY: 149 mg/dL — AB (ref 65–99)
Glucose-Capillary: 142 mg/dL — ABNORMAL HIGH (ref 65–99)

## 2017-04-12 LAB — CBC
HCT: 28.1 % — ABNORMAL LOW (ref 40.0–52.0)
HEMOGLOBIN: 9.6 g/dL — AB (ref 13.0–18.0)
MCH: 34.3 pg — AB (ref 26.0–34.0)
MCHC: 34.2 g/dL (ref 32.0–36.0)
MCV: 100.1 fL — AB (ref 80.0–100.0)
PLATELETS: 64 10*3/uL — AB (ref 150–440)
RBC: 2.81 MIL/uL — AB (ref 4.40–5.90)
RDW: 14.5 % (ref 11.5–14.5)
WBC: 16.7 10*3/uL — ABNORMAL HIGH (ref 3.8–10.6)

## 2017-04-12 LAB — HEPATITIS B SURFACE ANTIBODY,QUALITATIVE: HEP B S AB: NONREACTIVE

## 2017-04-12 LAB — HEPATITIS B CORE ANTIBODY, TOTAL: HEP B C TOTAL AB: NEGATIVE

## 2017-04-12 LAB — RENAL FUNCTION PANEL
Albumin: 2.6 g/dL — ABNORMAL LOW (ref 3.5–5.0)
Anion gap: 7 (ref 5–15)
BUN: 56 mg/dL — ABNORMAL HIGH (ref 6–20)
CALCIUM: 7.1 mg/dL — AB (ref 8.9–10.3)
CHLORIDE: 100 mmol/L — AB (ref 101–111)
CO2: 26 mmol/L (ref 22–32)
CREATININE: 6.27 mg/dL — AB (ref 0.61–1.24)
GFR calc Af Amer: 9 mL/min — ABNORMAL LOW (ref >60)
GFR calc non Af Amer: 7 mL/min — ABNORMAL LOW (ref >60)
GLUCOSE: 156 mg/dL — AB (ref 65–99)
Phosphorus: 4.7 mg/dL — ABNORMAL HIGH (ref 2.5–4.6)
Potassium: 3.7 mmol/L (ref 3.5–5.1)
SODIUM: 133 mmol/L — AB (ref 135–145)

## 2017-04-12 LAB — HEPATITIS B SURFACE ANTIGEN: Hepatitis B Surface Ag: NEGATIVE

## 2017-04-12 NOTE — Anesthesia Postprocedure Evaluation (Signed)
Anesthesia Post Note  Patient: Patrick Payne  Procedure(s) Performed: Procedure(s) (LRB): CYSTOSCOPY WITH STENT PLACEMENT (Left)  Patient location during evaluation: PACU Anesthesia Type: General Level of consciousness: awake and alert Pain management: pain level controlled Vital Signs Assessment: post-procedure vital signs reviewed and stable Respiratory status: spontaneous breathing, nonlabored ventilation, respiratory function stable and patient connected to nasal cannula oxygen Cardiovascular status: blood pressure returned to baseline and stable Postop Assessment: no signs of nausea or vomiting Anesthetic complications: no     Last Vitals:  Vitals:   04/12/17 0022 04/12/17 0536  BP: 125/61 (!) 111/55  Pulse: 78 71  Resp: 20 20  Temp: 37.3 C 37.6 C    Last Pain:  Vitals:   04/12/17 0536  TempSrc: Oral  PainSc:                  Martha Clan

## 2017-04-12 NOTE — Progress Notes (Signed)
[place] at [facility]                                                                                                                                                                                  Patient Demographics   Patrick Payne, is a 83 y.o. male, DOB - 1934/04/05, XTG:[id number]  Admit date - 04/09/2017   Admitting Physician Lance Coon, MD  Outpatient Primary MD for the patient is Sparks, [name], MD   LOS - 3  Subjective: Patient seen in dialysis Patient underwent Cystoscopy left retrograde pyelogram and left ureteral stent placement, Foley catheter yesterday making some urine, states nausea resolved   Review of Systems:   CONSTITUTIONAL: No documented fever. No fatigue, weakness. No weight gain, no weight loss.  EYES: No blurry or double vision.  ENT: No tinnitus. No postnasal drip. No redness of the oropharynx.  RESPIRATORY: No cough, no wheeze, no hemoptysis. No dyspnea.  CARDIOVASCULAR: No chest pain. No orthopnea. No palpitations. No syncope.  GASTROINTESTINAL: No nausea, no vomiting or diarrhea. No abdominal pain. No melena or hematochezia.  GENITOURINARY: No dysuria or hematuria.  ENDOCRINE: No polyuria or nocturia. No heat or cold intolerance.  HEMATOLOGY: No anemia. No bruising. No bleeding.  INTEGUMENTARY: No rashes. No lesions.  MUSCULOSKELETAL: No arthritis. No swelling. No gout.  NEUROLOGIC: No numbness, tingling, or ataxia. No seizure-type activity.  PSYCHIATRIC: No anxiety. No insomnia. No ADD.    Vitals:   Vitals:   04/12/17 1150 04/12/17 1220 04/12/17 1234 04/12/17 1239  BP: (!) 149/78 (!) 148/67 (!) 147/67 (!) 144/67  Pulse: (!) 59 64 65 65  Resp: (!) 25 15 (!) 21 (!) 24  Temp:   99 F (37.2 C)   TempSrc:   Oral   SpO2: 100% 100% 100% 99%  Weight:      Height:        Wt Readings from Last 3 Encounters:  04/12/17 262 lb 5.6 oz (119 kg)  03/29/17 248 lb 8 oz (112.7 kg)  08/26/16 248 lb (112.5 kg)      Intake/Output Summary (Last 24 hours) at 04/12/17 1254 Last data filed at 04/12/17 1234  Gross per 24 hour  Intake          2777.11 ml  Output               75 ml  Net          2702.11 ml    Physical Exam:   GENERAL: Pleasant-appearing in no apparent distress.  HEAD, EYES, EARS, NOSE AND THROAT: Atraumatic, normocephalic. Extraocular muscles are intact. Pupils equal and reactive to light. Sclerae anicteric. No conjunctival injection.  No oro-pharyngeal erythema.  NECK: Supple. There is no jugular venous distention. No bruits, no lymphadenopathy, no thyromegaly.  HEART: Regular rate and rhythm,. No murmurs, no rubs, no clicks.  LUNGS: Clear to auscultation bilaterally. No rales or rhonchi. No wheezes.  ABDOMEN: Soft, flat, nontender, nondistended. Has good bowel sounds. No hepatosplenomegaly appreciated.  EXTREMITIES: No evidence of any cyanosis, clubbing, or 1+ peripheral edema.  +2 pedal and radial pulses bilaterally.  NEUROLOGIC: The patient is alert, awake, and oriented x3 with no focal motor or sensory deficits appreciated bilaterally.  SKIN: Moist and warm with no rashes appreciated.  Psych: Not anxious, depressed LN: No inguinal LN enlargement    Antibiotics   Anti-infectives    Start     Dose/Rate Route Frequency Ordered Stop   04/11/17 1800  ceFAZolin (ANCEF) IVPB 1 g/50 mL premix     1 g 100 mL/hr over 30 Minutes Intravenous  Once 04/11/17 1418 04/11/17 1905   04/10/17 1800  ciprofloxacin (CIPRO) tablet 500 mg     500 mg Oral Every 24 hours 04/09/17 2351     04/09/17 2215  metroNIDAZOLE (FLAGYL) tablet 500 mg     500 mg Oral Every 8 hours 04/09/17 2201     04/09/17 1730  ciprofloxacin (CIPRO) IVPB 400 mg     400 mg 200 mL/hr over 60 Minutes Intravenous  Once 04/09/17 1727 04/09/17 2006   04/09/17 1730  metroNIDAZOLE (FLAGYL) IVPB 500 mg     500 mg 100 mL/hr over 60 Minutes Intravenous  Once 04/09/17 1727 04/09/17 1852      Medications   Scheduled Meds: .  aspirin  81 mg Oral Daily  . atenolol  50 mg Oral Daily  . atorvastatin  10 mg Oral q1800  . ciprofloxacin  500 mg Oral Q24H  . clonazePAM  1 mg Oral QHS  . heparin  5,000 Units Subcutaneous Q8H  . insulin aspart  0-5 Units Subcutaneous QHS  . insulin aspart  0-9 Units Subcutaneous TID WC  . metroNIDAZOLE  500 mg Oral Q8H  . tamsulosin  0.4 mg Oral Daily  . tuberculin  5 Units Intradermal Once   Continuous Infusions:  PRN Meds:.acetaminophen **OR** acetaminophen, LORazepam, ondansetron **OR** ondansetron (ZOFRAN) IV, oxyCODONE, promethazine   Data Review:   Micro Results Recent Results (from the past 240 hour(s))  Urine Culture     Status: Abnormal   Collection Time: 04/09/17  7:24 PM  Result Value Ref Range Status   Specimen Description URINE, RANDOM  Final   Special Requests NONE  Final   Culture (A)  Final    <10,000 COLONIES/mL INSIGNIFICANT GROWTH Performed at [facility], [address] [place], [place] [address]    Report Status 04/11/2017 FINAL  Final    Radiology Reports Ct Abdomen Pelvis Wo Contrast  Result Date: 04/09/2017 CLINICAL DATA:  Left lower quadrant pain.  Acute renal failure. EXAM: CT ABDOMEN AND PELVIS WITHOUT CONTRAST TECHNIQUE: Multidetector CT imaging of the abdomen and pelvis was performed following the standard protocol without IV contrast. COMPARISON:  CT 03/02/2017 FINDINGS: Lower chest: Linear atelectasis in the left lower lobe. No pleural effusion. Coronary artery calcifications are seen. Hepatobiliary: Decreased hepatic density consistent with steatosis. Small subcentimeter hypodensity in the right lobe is unchanged but incompletely characterized. Gallbladder is surgically absent, no biliary dilatation. Pancreas: Parenchymal atrophy. No ductal dilatation or inflammation. Spleen: Normal in size without focal abnormality. Adrenals/Urinary Tract: Normal adrenal glands. Enlargement and cystic replacement of the right kidney with innumerable  cysts  of varying sizes. Appearing density and some with thin calcifications. Overall appearance is unchanged from prior exam. The right ureter is nondilated. There is prominence of the left renal pelvis in ureter with left perinephric edema. No urolithiasis. Exophytic cyst from the lower left kidney is unchanged. Urinary bladder is completely decompressed. Stomach/Bowel: Stomach is physiologically distended without wall thickening. No small bowel dilatation, inflammation or obstruction. Normal appendix. Multifocal colonic diverticulosis throughout the entire colon. Possible early pericolonic inflammation and wall thickening at the distal descending colon. No perforation. No abscess. Vascular/Lymphatic: Aortic and branch atherosclerosis. Aneurysmal dilatation of the infrarenal aorta maximal dimension 3.9 cm, unchanged from prior exam. No periaortic soft tissue stranding to suggest rupture. Enlarged right external iliac node measures 2.2 cm series 2, image 83, unchanged from prior exam. Additional prominent and enlarged pelvic and retroperitoneal nodes are also stable. No progressive lymphadenopathy. Reproductive: Normal sized prostate gland. Other: Fat within both inguinal canals. Small fat containing supraumbilical ventral abdominal wall hernia contains only fat. No free air, free fluid, or intra-abdominal fluid collection. Musculoskeletal: Again seen degenerative change in the spine. Probable hemangioma within L1 vertebral body. Chronic deformity of the right anterior iliac crest. IMPRESSION: 1. Multifocal colonic diverticulosis. Possible early uncomplicated acute diverticulitis in the distal descending colon. 2. Prominence of the left ureter and renal collecting system with increased perinephric edema, suspicious for urinary tract infection. No urolithiasis. 3. Polycystic disease of the right kidney, stable in noncontrast CT appearance. 4. Aortic atherosclerosis with infrarenal abdominal aortic aneurysm measuring 3.9 x  3.8 cm. Recommend followup by ultrasound in 2 years. This recommendation follows ACR consensus guidelines: White Paper of the ACR Incidental Findings Committee II on Vascular Findings. J Am Coll Radiol 2013; 10:789-794. 5. Hepatic steatosis. 6. Stable enlarged pelvic and retroperitoneal nodes. Electronically Signed   By: Jeb Levering M.D.   On: 04/09/2017 19:59   US Renal  Result Date: 04/11/2017 CLINICAL DATA:  Acute renal failure EXAM: RENAL / URINARY TRACT ULTRASOUND COMPLETE COMPARISON:  04/09/2017 FINDINGS: Right Kidney: Length: 23.3 cm. Changes consistent with polycystic kidney disease are noted. The largest of these cystic changes measures approximately 6.4 cm. No obstructive changes are noted. These changes are similar to that seen on recent CT. Left Kidney: Length: 14.4 cm. 1.8 cm cyst is noted within the left kidney. No obstructive changes are seen. Bladder: Bladder is decompressed IMPRESSION: Changes of polycystic disease within the right kidney. Single left renal cyst is noted. Electronically Signed   By: Inez Catalina M.D.   On: 04/11/2017 10:55     CBC  Recent Labs Lab 04/09/17 1446 04/10/17 0411 04/12/17 0507  WBC 24.8* 13.3* 16.7*  HGB 11.0* 9.5* 9.6*  HCT 33.3* 29.0* 28.1*  PLT 74* 57* 64*  MCV 100.2* 100.0 100.1*  MCH 33.1 32.8 34.3*  MCHC 33.0 32.8 34.2  RDW 15.0* 15.1* 14.5    Chemistries   Recent Labs Lab 04/09/17 1446 04/10/17 0411 04/11/17 0628 04/12/17 0507  NA 134* 133* 132* 133*  K 4.5 4.3 4.4 3.7  CL 104 106 105 100*  CO2 24 21* 17* 26  GLUCOSE 165* 127* 163* 156*  BUN 45* 51* 61* 56*  CREATININE 4.02* 4.89* 6.52* 6.27*  CALCIUM 8.7* 7.5* 7.3* 7.1*  AST 22  --   --   --   ALT 19  --   --   --   ALKPHOS 76  --   --   --   BILITOT 0.9  --   --   --    ------------------------------------------------------------------------------------------------------------------  estimated creatinine clearance is 11.6 mL/min (A) (by C-G formula based on SCr of  6.27 mg/dL (H)). ------------------------------------------------------------------------------------------------------------------ No results for input(s): HGBA1C in the last 72 hours. ------------------------------------------------------------------------------------------------------------------ No results for input(s): CHOL, HDL, LDLCALC, TRIG, CHOLHDL, LDLDIRECT in the last 72 hours. ------------------------------------------------------------------------------------------------------------------ No results for input(s): TSH, T4TOTAL, T3FREE, THYROIDAB in the last 72 hours.  Invalid input(s): FREET3 ------------------------------------------------------------------------------------------------------------------ No results for input(s): VITAMINB12, FOLATE, FERRITIN, TIBC, IRON, RETICCTPCT in the last 72 hours.  Coagulation profile No results for input(s): INR, PROTIME in the last 168 hours.  No results for input(s): DDIMER in the last 72 hours.  Cardiac Enzymes No results for input(s): CKMB, TROPONINI, MYOGLOBIN in the last 168 hours.  Invalid input(s): CK ------------------------------------------------------------------------------------------------------------------ Invalid input(s): [place]  Patient is a 83 year old with acute on chronic renal failure 1. Acute on chronic renal failure (HCC) Stage III Felt to be due to due to ATN, obstructive uropathy and prerenal azotemia Status post stent placement and Foley catheter placement status post hemodialysis Continue hemodialysis per nephrology   2. possible  Diverticulitis - seen on CT scan,  continue anabiotic  3.  Chronic lymphocytic leukemia (HCC) -WBC count going up and down check CBC in the morning  4.   Diabetes mellitus ([place]) - sliding scale insulin corresponding glucose checks Lantus on hold currently blood sugar acceptable  5, essential hypertension : mycardi and  HCTZ on hold due to acute  renal failure continue atenolol  6.low plt suspect related to CLL continue to monitor  All the records are reviewed and case discussed with ED provider. Management plans discussed with the patient and/or family.     Code Status Orders        Start     Ordered   04/09/17 2202  Full code  Continuous     04/09/17 2201    Code Status History    Date Active Date Inactive Code Status Order ID Comments User Context   This patient has a current code status but no historical code status.    Advance Directive Documentation     Most Recent Value  Type of Advance Directive  Healthcare Power of Attorney, Living will  Pre-existing out of facility DNR order (yellow form or pink MOST form)  -  "MOST" Form in Place?  -           Consults  Urology and nephrology  DVT Prophylaxis  SCDs due to thrombocytopenia  Lab Results  Component Value Date   PLT 64 (L) 04/12/2017     Time Spent in minutes  13min  Greater than 50% of time spent in care coordination and counseling patient regarding the condition and plan of care.   Dustin Flock M.D on 04/12/2017 at 12:54 PM  Between 7am to 6pm - Pager - [phone]  After 6pm go to [email or web address] - password [email or web address] [facility]   Office  [phone]

## 2017-04-12 NOTE — Progress Notes (Signed)
Pre hd info 

## 2017-04-12 NOTE — Plan of Care (Signed)
Problem: Bowel/Gastric: Goal: Will not experience complications related to bowel motility Outcome: Not Progressing Pt feels as though he needs to have a bowel movement but remains unsuccessful after several attempts.

## 2017-04-12 NOTE — Progress Notes (Signed)
[facility]  ROUNDING NOTE   Subjective:   First hemodialysis yesterday. Tolerated treatment well.   Cystoscopy and ureteral stent placement by Dr. Junious Silk yesterday. UOP 200 - red in color.   Patient states his nausea feels better.   Daughter at bedside.   Objective:  Vital signs in last 24 hours:  Temp:  [98 F (36.7 C)-100 F (37.8 C)] 99 F (37.2 C) (07/04 1010) Pulse Rate:  [56-92] 63 (07/04 1120) Resp:  [15-29] 20 (07/04 1120) BP: (111-166)/(55-100) 135/64 (07/04 1120) SpO2:  [90 %-100 %] 100 % (07/04 1120) Weight:  [112.3 kg (247 lb 9.2 oz)-119 kg (262 lb 5.6 oz)] 119 kg (262 lb 5.6 oz) (07/04 1010)  Weight change:  Filed Weights   04/11/17 1355 04/11/17 1605 04/12/17 1010  Weight: 112.3 kg (247 lb 9.2 oz) 112.3 kg (247 lb 9.2 oz) 119 kg (262 lb 5.6 oz)    Intake/Output: I/O last 3 completed shifts: In: 3167.1 [I.V.:3167.1] Out: 202 [Urine:200; Blood:2]   Intake/Output this shift:  Total I/O In: 760 [P.O.:760] Out: 0   Physical Exam: General: NAD, laying in bed  Head: Normocephalic, atraumatic. Moist oral mucosal membranes  Eyes: Anicteric, PERRL  Neck: Supple, trachea midline  Lungs:  Clear to auscultation  Heart: Regular rate and rhythm  Abdomen:  Soft, nontender, +obese  Extremities: + peripheral edema.  Neurologic: Nonfocal, moving all four extremities  Skin: No lesions       Basic Metabolic Panel:  Recent Labs Lab 04/09/17 1446 04/10/17 0411 04/11/17 0628 04/12/17 0507  NA 134* 133* 132* 133*  K 4.5 4.3 4.4 3.7  CL 104 106 105 100*  CO2 24 21* 17* 26  GLUCOSE 165* 127* 163* 156*  BUN 45* 51* 61* 56*  CREATININE 4.02* 4.89* 6.52* 6.27*  CALCIUM 8.7* 7.5* 7.3* 7.1*  PHOS  --   --  5.8* 4.7*    Liver Function Tests:  Recent Labs Lab 04/09/17 1446 04/11/17 0628 04/12/17 0507  AST 22  --   --   ALT 19  --   --   ALKPHOS 76  --   --   BILITOT 0.9  --   --   PROT 6.1*  --   --   ALBUMIN 3.7 3.1* 2.6*     Recent Labs Lab 04/09/17 1446  LIPASE 22   No results for input(s): AMMONIA in the last 168 hours.  CBC:  Recent Labs Lab 04/09/17 1446 04/10/17 0411 04/12/17 0507  WBC 24.8* 13.3* 16.7*  HGB 11.0* 9.5* 9.6*  HCT 33.3* 29.0* 28.1*  MCV 100.2* 100.0 100.1*  PLT 74* 57* 64*    Cardiac Enzymes: No results for input(s): CKTOTAL, CKMB, CKMBINDEX, TROPONINI in the last 168 hours.  BNP: Invalid input(s): POCBNP  CBG:  Recent Labs Lab 04/10/17 2042 04/11/17 0731 04/11/17 1652 04/11/17 2111 04/12/17 0735  GLUCAP 148* 162* 156* 161* 148*    Microbiology: Results for orders placed or performed during the hospital encounter of 04/09/17  Urine Culture     Status: Abnormal   Collection Time: 04/09/17  7:24 PM  Result Value Ref Range Status   Specimen Description URINE, RANDOM  Final   Special Requests NONE  Final   Culture (A)  Final    <10,000 COLONIES/mL INSIGNIFICANT GROWTH Performed at [facility], [place] [address] [place], [place] [address]    Report Status 04/11/2017 FINAL  Final    Coagulation Studies: No results for input(s): LABPROT, INR in the last 72  hours.  Urinalysis:  Recent Labs  04/09/17 1429  COLORURINE YELLOW*  LABSPEC 1.011  PHURINE 5.0  GLUCOSEU NEGATIVE  HGBUR MODERATE*  BILIRUBINUR NEGATIVE  KETONESUR NEGATIVE  PROTEINUR 30*  NITRITE NEGATIVE  LEUKOCYTESUR TRACE*      Imaging: US Renal  Result Date: 04/11/2017 CLINICAL DATA:  Acute renal failure EXAM: RENAL / URINARY TRACT ULTRASOUND COMPLETE COMPARISON:  04/09/2017 FINDINGS: Right Kidney: Length: 23.3 cm. Changes consistent with polycystic kidney disease are noted. The largest of these cystic changes measures approximately 6.4 cm. No obstructive changes are noted. These changes are similar to that seen on recent CT. Left Kidney: Length: 14.4 cm. 1.8 cm cyst is noted within the left kidney. No obstructive changes are seen. Bladder: Bladder is decompressed IMPRESSION:  Changes of polycystic disease within the right kidney. Single left renal cyst is noted. Electronically Signed   By: Inez Catalina M.D.   On: 04/11/2017 10:55     Medications:    . aspirin  81 mg Oral Daily  . atenolol  50 mg Oral Daily  . atorvastatin  10 mg Oral q1800  . ciprofloxacin  500 mg Oral Q24H  . clonazePAM  1 mg Oral QHS  . heparin  5,000 Units Subcutaneous Q8H  . insulin aspart  0-5 Units Subcutaneous QHS  . insulin aspart  0-9 Units Subcutaneous TID WC  . metroNIDAZOLE  500 mg Oral Q8H  . tamsulosin  0.4 mg Oral Daily  . tuberculin  5 Units Intradermal Once   acetaminophen **OR** acetaminophen, LORazepam, ondansetron **OR** ondansetron (ZOFRAN) IV, oxyCODONE, promethazine  Assessment/ Plan:  [name] is a 83 y.o. white male with hypertension, polycystic kidney disease, hyperlipidemia, diabetes mellitus type II insulin dependent, gout, BPH, allergic rhinitis, CLL, AAA, obstructive sleep apnea.   1. Acute renal failure with hematuria on chronic kidney disease stage III with proteinuria: with concern of ATN, obstructive uropathy and prerenal azotemia.  Baseline creatinine of 1.5, GFR of 45 from 12/12/16. 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  - Hemodialysis treatment yesterday. Dialysis again today. Then monitor daily for dialysis need.  - renally dose all medications.  - Discussed with patient and daughter  - [facility] input.   2. Hypertension: blood pressure at goal.  - atenolol, tamsulosin  3. Diabetes mellitus type II: with chronic kidney disease: insulin dependent. Hemoglobin A1c 7% on 3/5. - continue glucose control.    LOS: [place], Yeila Morro 7/4/201811:56 AM

## 2017-04-12 NOTE — Progress Notes (Signed)
Hd start 

## 2017-04-12 NOTE — Progress Notes (Signed)
Post hd assessment 

## 2017-04-12 NOTE — Progress Notes (Signed)
1 Day Post-Op Subjective: Patient looks and feels much better today. Nausea improved. Pain resolved.   Objective: Vital signs in last 24 hours: Temp:  [98 F (36.7 C)-100 F (37.8 C)] 99.7 F (37.6 C) (07/04 1645) Pulse Rate:  [56-86] 70 (07/04 1645) Resp:  [15-25] 18 (07/04 1645) BP: (111-163)/(55-100) 148/65 (07/04 1645) SpO2:  [90 %-100 %] 91 % (07/04 1645) Weight:  [117.1 kg (258 lb 2.5 oz)-119 kg (262 lb 5.6 oz)] 117.1 kg (258 lb 2.5 oz) (07/04 1308)  Intake/Output from previous day: 07/03 0701 - 07/04 0700 In: 2017.1 [I.V.:2017.1] Out: 202 [Urine:200; Blood:2] Intake/Output this shift: Total I/O In: -  Out: 250 [Urine:250]  Physical Exam:  NAD Urine still dark blood, but thin - no clots  Lab Results:  Recent Labs  04/10/17 0411 04/12/17 0507  HGB 9.5* 9.6*  HCT 29.0* 28.1*   BMET  Recent Labs  04/11/17 0628 04/12/17 0507  NA 132* 133*  K 4.4 3.7  CL 105 100*  CO2 17* 26  GLUCOSE 163* 156*  BUN 61* 56*  CREATININE 6.52* 6.27*  CALCIUM 7.3* 7.1*   No results for input(s): LABPT, INR in the last 72 hours. No results for input(s): LABURIN in the last 72 hours. Results for orders placed or performed during the hospital encounter of 04/09/17  Urine Culture     Status: Abnormal   Collection Time: 04/09/17  7:24 PM  Result Value Ref Range Status   Specimen Description URINE, RANDOM  Final   Special Requests NONE  Final   Culture (A)  Final    <10,000 COLONIES/mL INSIGNIFICANT GROWTH Performed at [facility], [place] [address] [place], [place] [address]    Report Status 04/11/2017 FINAL  Final    Studies/Results: US Renal  Result Date: 04/11/2017 CLINICAL DATA:  Acute renal failure EXAM: RENAL / URINARY TRACT ULTRASOUND COMPLETE COMPARISON:  04/09/2017 FINDINGS: Right Kidney: Length: 23.3 cm. Changes consistent with polycystic kidney disease are noted. The largest of these cystic changes measures approximately 6.4 cm. No obstructive changes are  noted. These changes are similar to that seen on recent CT. Left Kidney: Length: 14.4 cm. 1.8 cm cyst is noted within the left kidney. No obstructive changes are seen. Bladder: Bladder is decompressed IMPRESSION: Changes of polycystic disease within the right kidney. Single left renal cyst is noted. Electronically Signed   By: Inez Catalina M.D.   On: 04/11/2017 10:55    Assessment/Plan: Acute on chronic renal failure, gross hematuria - pt feeling much better after HD and stent yesterday - -s/p left stent 7/3 -UOP still low -Urine Cx < 10 k mixed - I sent a repeat urine cx 7/3 from OR  -d/c foley whenever - pt wanted to keep it tonight -discussed GU f/u with pt and son in law - f/u to plan stent removal, consider left URS given gross hematuria  -will sign off, page GU on call with any questions    LOS: 3 days   Simon Aaberg 04/12/2017, 8:01 PM

## 2017-04-12 NOTE — Progress Notes (Signed)
Pre hd assessment  

## 2017-04-12 NOTE — Progress Notes (Signed)
  End of hd 

## 2017-04-12 NOTE — Progress Notes (Signed)
Post hd vitals 

## 2017-04-13 ENCOUNTER — Encounter: Payer: Self-pay | Admitting: Urology

## 2017-04-13 LAB — BASIC METABOLIC PANEL
Anion gap: 11 (ref 5–15)
BUN: 46 mg/dL — AB (ref 6–20)
CO2: 25 mmol/L (ref 22–32)
CREATININE: 5.14 mg/dL — AB (ref 0.61–1.24)
Calcium: 7.1 mg/dL — ABNORMAL LOW (ref 8.9–10.3)
Chloride: 98 mmol/L — ABNORMAL LOW (ref 101–111)
GFR calc non Af Amer: 10 mL/min — ABNORMAL LOW (ref >60)
GFR, EST AFRICAN AMERICAN: 11 mL/min — AB (ref >60)
Glucose, Bld: 122 mg/dL — ABNORMAL HIGH (ref 65–99)
Potassium: 3.6 mmol/L (ref 3.5–5.1)
SODIUM: 134 mmol/L — AB (ref 135–145)

## 2017-04-13 LAB — CBC
HCT: 26.5 % — ABNORMAL LOW (ref 40.0–52.0)
Hemoglobin: 8.9 g/dL — ABNORMAL LOW (ref 13.0–18.0)
MCH: 32.9 pg (ref 26.0–34.0)
MCHC: 33.4 g/dL (ref 32.0–36.0)
MCV: 98.7 fL (ref 80.0–100.0)
PLATELETS: 70 10*3/uL — AB (ref 150–440)
RBC: 2.69 MIL/uL — ABNORMAL LOW (ref 4.40–5.90)
RDW: 14.5 % (ref 11.5–14.5)
WBC: 15.5 10*3/uL — ABNORMAL HIGH (ref 3.8–10.6)

## 2017-04-13 LAB — GLUCOSE, CAPILLARY
GLUCOSE-CAPILLARY: 111 mg/dL — AB (ref 65–99)
GLUCOSE-CAPILLARY: 125 mg/dL — AB (ref 65–99)
Glucose-Capillary: 150 mg/dL — ABNORMAL HIGH (ref 65–99)
Glucose-Capillary: 156 mg/dL — ABNORMAL HIGH (ref 65–99)

## 2017-04-13 LAB — URINE CULTURE: Culture: NO GROWTH

## 2017-04-13 NOTE — Progress Notes (Signed)
[facility]                                                                                                                                                                                  Patient Demographics   Patrick Payne, is a 83 y.o. male, DOB - Sep 30, 1934, INO:[id number]  Admit date - 04/09/2017   Admitting Physician Lance Coon, MD  Outpatient Primary MD for the patient is Idelle Crouch, MD   LOS - 4  Subjective: Patient seen in dialysis Patient underwent Cystoscopy left retrograde pyelogram and left ureteral stent placement, Foley catheter. The patient has no complaints, hematuria is better. Review of Systems:   CONSTITUTIONAL: No documented fever. No fatigue, weakness. No weight gain, no weight loss.  EYES: No blurry or double vision.  ENT: No tinnitus. No postnasal drip. No redness of the oropharynx.  RESPIRATORY: No cough, no wheeze, no hemoptysis. No dyspnea.  CARDIOVASCULAR: No chest pain. No orthopnea. No palpitations. No syncope.  GASTROINTESTINAL: No nausea, no vomiting or diarrhea. No abdominal pain. No melena or hematochezia.  GENITOURINARY: No dysuria or hematuria.  ENDOCRINE: No polyuria or nocturia. No heat or cold intolerance.  HEMATOLOGY: No anemia. No bruising. No bleeding.  INTEGUMENTARY: No rashes. No lesions.  MUSCULOSKELETAL: No arthritis. No swelling. No gout.  NEUROLOGIC: No numbness, tingling, or ataxia. No seizure-type activity.  PSYCHIATRIC: No anxiety. No insomnia. No ADD.    Vitals:   Vitals:   04/13/17 0557 04/13/17 1041 04/13/17 1214 04/13/17 1451  BP: (!) 116/51 (!) 122/58  (!) 145/58  Pulse: 62   (!) 57  Resp: 17     Temp: 99 F (37.2 C)   98.7 F (37.1 C)  TempSrc: Oral   Oral  SpO2: 94%   97%  Weight:   259 lb 9.6 oz (117.8 kg)   Height:        Wt Readings from Last 3 Encounters:  04/13/17 259 lb 9.6 oz (117.8 kg)  03/29/17 248 lb 8 oz (112.7 kg)  08/26/16 248 lb (112.5 kg)      Intake/Output Summary (Last 24 hours) at 04/13/17 1654 Last data filed at 04/13/17 1405  Gross per 24 hour  Intake              240 ml  Output              800 ml  Net             -560 ml    Physical Exam:   GENERAL: Pleasant-appearing in no apparent distress. Obese. HEAD, EYES, EARS, NOSE AND THROAT: Atraumatic, normocephalic. Extraocular muscles are intact. Pupils equal  and reactive to light. Sclerae anicteric. No conjunctival injection. No oro-pharyngeal erythema.  NECK: Supple. There is no jugular venous distention. No bruits, no lymphadenopathy, no thyromegaly.  HEART: Regular rate and rhythm,. No murmurs, no rubs, no clicks.  LUNGS: Clear to auscultation bilaterally. No rales or rhonchi. No wheezes.  ABDOMEN: Soft, flat, nontender, nondistended. Has good bowel sounds. No hepatosplenomegaly appreciated.  EXTREMITIES: No evidence of any cyanosis, clubbing, or edema.  +2 pedal and radial pulses bilaterally.  NEUROLOGIC: The patient is alert, awake, and oriented x3 with no focal motor or sensory deficits appreciated bilaterally.  SKIN: Moist and warm with no rashes appreciated.  Psych: Not anxious, depressed LN: No inguinal LN enlargement    Antibiotics   Anti-infectives    Start     Dose/Rate Route Frequency Ordered Stop   04/11/17 1800  ceFAZolin (ANCEF) IVPB 1 g/50 mL premix     1 g 100 mL/hr over 30 Minutes Intravenous  Once 04/11/17 1418 04/11/17 1905   04/10/17 1800  ciprofloxacin (CIPRO) tablet 500 mg     500 mg Oral Every 24 hours 04/09/17 2351     04/09/17 2215  metroNIDAZOLE (FLAGYL) tablet 500 mg     500 mg Oral Every 8 hours 04/09/17 2201     04/09/17 1730  ciprofloxacin (CIPRO) IVPB 400 mg     400 mg 200 mL/hr over 60 Minutes Intravenous  Once 04/09/17 1727 04/09/17 2006   04/09/17 1730  metroNIDAZOLE (FLAGYL) IVPB 500 mg     500 mg 100 mL/hr over 60 Minutes Intravenous  Once 04/09/17 1727 04/09/17 1852      Medications   Scheduled Meds: . aspirin   81 mg Oral Daily  . atenolol  50 mg Oral Daily  . atorvastatin  10 mg Oral q1800  . ciprofloxacin  500 mg Oral Q24H  . clonazePAM  1 mg Oral QHS  . insulin aspart  0-5 Units Subcutaneous QHS  . insulin aspart  0-9 Units Subcutaneous TID WC  . metroNIDAZOLE  500 mg Oral Q8H  . tamsulosin  0.4 mg Oral Daily  . tuberculin  5 Units Intradermal Once   Continuous Infusions:  PRN Meds:.acetaminophen **OR** acetaminophen, LORazepam, ondansetron **OR** ondansetron (ZOFRAN) IV, oxyCODONE, promethazine   Data Review:   Micro Results Recent Results (from the past 240 hour(s))  Urine Culture     Status: Abnormal   Collection Time: 04/09/17  7:24 PM  Result Value Ref Range Status   Specimen Description URINE, RANDOM  Final   Special Requests NONE  Final   Culture (A)  Final    <10,000 COLONIES/mL INSIGNIFICANT GROWTH Performed at [facility], [address], [place], [place] [address]    Report Status 04/11/2017 FINAL  Final  Urine Culture     Status: None   Collection Time: 04/11/17  6:54 PM  Result Value Ref Range Status   Specimen Description URINE, SUPRAPUBIC CYSTOSCOPY  Final   Special Requests NONE  Final   Culture   Final    NO GROWTH Performed at [facility], [place] [address], [place], [place] [address]    Report Status 04/13/2017 FINAL  Final    Radiology Reports Ct Abdomen Pelvis Wo Contrast  Result Date: 04/09/2017 CLINICAL DATA:  Left lower quadrant pain.  Acute renal failure. EXAM: CT ABDOMEN AND PELVIS WITHOUT CONTRAST TECHNIQUE: Multidetector CT imaging of the abdomen and pelvis was performed following the standard protocol without IV contrast. COMPARISON:  CT 03/02/2017 FINDINGS: Lower chest:  Linear atelectasis in the left lower lobe. No pleural effusion. Coronary artery calcifications are seen. Hepatobiliary: Decreased hepatic density consistent with steatosis. Small subcentimeter hypodensity in the right lobe is unchanged but incompletely characterized.  Gallbladder is surgically absent, no biliary dilatation. Pancreas: Parenchymal atrophy. No ductal dilatation or inflammation. Spleen: Normal in size without focal abnormality. Adrenals/Urinary Tract: Normal adrenal glands. Enlargement and cystic replacement of the right kidney with innumerable cysts of varying sizes. Appearing density and some with thin calcifications. Overall appearance is unchanged from prior exam. The right ureter is nondilated. There is prominence of the left renal pelvis in ureter with left perinephric edema. No urolithiasis. Exophytic cyst from the lower left kidney is unchanged. Urinary bladder is completely decompressed. Stomach/Bowel: Stomach is physiologically distended without wall thickening. No small bowel dilatation, inflammation or obstruction. Normal appendix. Multifocal colonic diverticulosis throughout the entire colon. Possible early pericolonic inflammation and wall thickening at the distal descending colon. No perforation. No abscess. Vascular/Lymphatic: Aortic and branch atherosclerosis. Aneurysmal dilatation of the infrarenal aorta maximal dimension 3.9 cm, unchanged from prior exam. No periaortic soft tissue stranding to suggest rupture. Enlarged right external iliac node measures 2.2 cm series 2, image 83, unchanged from prior exam. Additional prominent and enlarged pelvic and retroperitoneal nodes are also stable. No progressive lymphadenopathy. Reproductive: Normal sized prostate gland. Other: Fat within both inguinal canals. Small fat containing supraumbilical ventral abdominal wall hernia contains only fat. No free air, free fluid, or intra-abdominal fluid collection. Musculoskeletal: Again seen degenerative change in the spine. Probable hemangioma within L1 vertebral body. Chronic deformity of the right anterior iliac crest. IMPRESSION: 1. Multifocal colonic diverticulosis. Possible early uncomplicated acute diverticulitis in the distal descending colon. 2. Prominence  of the left ureter and renal collecting system with increased perinephric edema, suspicious for urinary tract infection. No urolithiasis. 3. Polycystic disease of the right kidney, stable in noncontrast CT appearance. 4. Aortic atherosclerosis with infrarenal abdominal aortic aneurysm measuring 3.9 x 3.8 cm. Recommend followup by ultrasound in 2 years. This recommendation follows ACR consensus guidelines: White Paper of the ACR Incidental Findings Committee II on Vascular Findings. J Am Coll Radiol 2013; 10:789-794. 5. Hepatic steatosis. 6. Stable enlarged pelvic and retroperitoneal nodes. Electronically Signed   By: Jeb Levering M.D.   On: 04/09/2017 19:59   US Renal  Result Date: 04/11/2017 CLINICAL DATA:  Acute renal failure EXAM: RENAL / URINARY TRACT ULTRASOUND COMPLETE COMPARISON:  04/09/2017 FINDINGS: Right Kidney: Length: 23.3 cm. Changes consistent with polycystic kidney disease are noted. The largest of these cystic changes measures approximately 6.4 cm. No obstructive changes are noted. These changes are similar to that seen on recent CT. Left Kidney: Length: 14.4 cm. 1.8 cm cyst is noted within the left kidney. No obstructive changes are seen. Bladder: Bladder is decompressed IMPRESSION: Changes of polycystic disease within the right kidney. Single left renal cyst is noted. Electronically Signed   By: Inez Catalina M.D.   On: 04/11/2017 10:55     CBC  Recent Labs Lab 04/09/17 1446 04/10/17 0411 04/12/17 0507 04/13/17 0532  WBC 24.8* 13.3* 16.7* 15.5*  HGB 11.0* 9.5* 9.6* 8.9*  HCT 33.3* 29.0* 28.1* 26.5*  PLT 74* 57* 64* 70*  MCV 100.2* 100.0 100.1* 98.7  MCH 33.1 32.8 34.3* 32.9  MCHC 33.0 32.8 34.2 33.4  RDW 15.0* 15.1* 14.5 14.5    Chemistries   Recent Labs Lab 04/09/17 1446 04/10/17 0411 04/11/17 0628 04/12/17 0507 04/13/17 0532  NA 134* 133* 132* 133* 134*  K  4.5 4.3 4.4 3.7 3.6  CL 104 106 105 100* 98*  CO2 24 21* 17* 26 25  GLUCOSE 165* 127* 163* 156* 122*   BUN 45* 51* 61* 56* 46*  CREATININE 4.02* 4.89* 6.52* 6.27* 5.14*  CALCIUM 8.7* 7.5* 7.3* 7.1* 7.1*  AST 22  --   --   --   --   ALT 19  --   --   --   --   ALKPHOS 76  --   --   --   --   BILITOT 0.9  --   --   --   --    ------------------------------------------------------------------------------------------------------------------ estimated creatinine clearance is 14.1 mL/min (A) (by C-G formula based on SCr of 5.14 mg/dL (H)). ------------------------------------------------------------------------------------------------------------------ No results for input(s): HGBA1C in the last 72 hours. ------------------------------------------------------------------------------------------------------------------ No results for input(s): CHOL, HDL, LDLCALC, TRIG, CHOLHDL, LDLDIRECT in the last 72 hours. ------------------------------------------------------------------------------------------------------------------ No results for input(s): TSH, T4TOTAL, T3FREE, THYROIDAB in the last 72 hours.  Invalid input(s): FREET3 ------------------------------------------------------------------------------------------------------------------ No results for input(s): VITAMINB12, FOLATE, FERRITIN, TIBC, IRON, RETICCTPCT in the last 72 hours.  Coagulation profile No results for input(s): INR, PROTIME in the last 168 hours.  No results for input(s): DDIMER in the last 72 hours.  Cardiac Enzymes No results for input(s): CKMB, TROPONINI, MYOGLOBIN in the last 168 hours.  Invalid input(s): CK ------------------------------------------------------------------------------------------------------------------ Invalid input(s): New [place]uwe  Patient is a 83 year old with acute on chronic renal failure 1. Acute on chronic renal failure (HCC) Stage III Felt to be due to due to ATN, obstructive uropathy and prerenal azotemia Status post stent placement and Foley catheter placement status  post hemodialysis Hemodialysis treatment last two days.  monitor daily for dialysis need per nephrology, Dr. Juleen China.   2. possible  Diverticulitis - seen on CT scan,  continue anabiotic  3.  Chronic lymphocytic leukemia (HCC) -WBC count going up and down check CBC in the morning  4.   Diabetes mellitus ([facility]) - sliding scale insulin corresponding glucose checks Lantus on hold currently blood sugar acceptable  5. Essential hypertension : mycardi and  HCTZ on hold due to acute renal failure, continue atenolol.  6.low plt suspect related to CLL, stable.  All the records are reviewed and case discussed with Dr. Juleen China. Management plans discussed with the patient and his wife.     Code Status Orders        Start     Ordered   04/09/17 2202  Full code  Continuous     04/09/17 2201    Code Status History    Date Active Date Inactive Code Status Order ID Comments User Context   This patient has a current code status but no historical code status.    Advance Directive Documentation     Most Recent Value  Type of Advance Directive  Healthcare Power of Attorney, Living will  Pre-existing out of facility DNR order (yellow form or pink MOST form)  -  "MOST" Form in Place?  -           Consults  Urology and nephrology  DVT Prophylaxis  SCDs due to thrombocytopenia  Lab Results  Component Value Date   PLT 70 (L) 04/13/2017     Time Spent in minutes  35 min  Greater than 50% of time spent in care coordination and counseling patient regarding the condition and plan of care.   Demetrios Loll M.D on 04/13/2017 at 4:54 PM  Between 7am to  6pm - Pager - [phone]  After 6pm go to [email or web address] - password [email or web address] [facility]   Office  [phone]

## 2017-04-13 NOTE — Progress Notes (Signed)
[facility]  ROUNDING NOTE   Subjective:   Wife at bedside.  UOP 550  Second hemodialysis treatment yesterday.   Creatinine 5.14 (6.27)  Cipro and metronidazole  Objective:  Vital signs in last 24 hours:  Temp:  [99 F (37.2 C)-99.7 F (37.6 C)] 99 F (37.2 C) (07/05 0557) Pulse Rate:  [62-70] 62 (07/05 0557) Resp:  [17-18] 17 (07/05 0557) BP: (116-148)/(51-65) 122/58 (07/05 1041) SpO2:  [90 %-94 %] 94 % (07/05 0557) Weight:  [117.8 kg (259 lb 9.6 oz)] 117.8 kg (259 lb 9.6 oz) (07/05 1214)  Weight change: 6.7 kg (14 lb 12.3 oz) Filed Weights   04/12/17 1010 04/12/17 1308 04/13/17 1214  Weight: 119 kg (262 lb 5.6 oz) 117.1 kg (258 lb 2.5 oz) 117.8 kg (259 lb 9.6 oz)    Intake/Output: I/O last 3 completed shifts: In: 3017.1 [P.O.:1000; I.V.:2017.1] Out: 625 [Urine:700; Blood:2]   Intake/Output this shift:  Total I/O In: 240 [P.O.:240] Out: 250 [Urine:250]  Physical Exam: General: NAD, laying in bed  Head: Normocephalic, atraumatic. Moist oral mucosal membranes  Eyes: Anicteric, PERRL  Neck: Supple, trachea midline  Lungs:  Clear to auscultation  Heart: Regular rate and rhythm  Abdomen:  Soft, nontender, +obese  Extremities: trace peripheral edema.  Neurologic: Nonfocal, moving all four extremities  Skin: No lesions  GU: +bloody urine on foley    Basic Metabolic Panel:  Recent Labs Lab 04/09/17 1446 04/10/17 0411 04/11/17 0628 04/12/17 0507 04/13/17 0532  NA 134* 133* 132* 133* 134*  K 4.5 4.3 4.4 3.7 3.6  CL 104 106 105 100* 98*  CO2 24 21* 17* 26 25  GLUCOSE 165* 127* 163* 156* 122*  BUN 45* 51* 61* 56* 46*  CREATININE 4.02* 4.89* 6.52* 6.27* 5.14*  CALCIUM 8.7* 7.5* 7.3* 7.1* 7.1*  PHOS  --   --  5.8* 4.7*  --     Liver Function Tests:  Recent Labs Lab 04/09/17 1446 04/11/17 0628 04/12/17 0507  AST 22  --   --   ALT 19  --   --   ALKPHOS 76  --   --   BILITOT 0.9  --   --   PROT 6.1*  --   --   ALBUMIN 3.7 3.1* 2.6*     Recent Labs Lab 04/09/17 1446  LIPASE 22   No results for input(s): AMMONIA in the last 168 hours.  CBC:  Recent Labs Lab 04/09/17 1446 04/10/17 0411 04/12/17 0507 04/13/17 0532  WBC 24.8* 13.3* 16.7* 15.5*  HGB 11.0* 9.5* 9.6* 8.9*  HCT 33.3* 29.0* 28.1* 26.5*  MCV 100.2* 100.0 100.1* 98.7  PLT 74* 57* 64* 70*    Cardiac Enzymes: No results for input(s): CKTOTAL, CKMB, CKMBINDEX, TROPONINI in the last 168 hours.  BNP: Invalid input(s): POCBNP  CBG:  Recent Labs Lab 04/12/17 1633 04/12/17 2013 04/12/17 2206 04/13/17 0742 04/13/17 1135  GLUCAP 183* 147* 149* 111* 125*    Microbiology: Results for orders placed or performed during the hospital encounter of 04/09/17  Urine Culture     Status: Abnormal   Collection Time: 04/09/17  7:24 PM  Result Value Ref Range Status   Specimen Description URINE, RANDOM  Final   Special Requests NONE  Final   Culture (A)  Final    <10,000 COLONIES/mL INSIGNIFICANT GROWTH Performed at [facility], [place] [address], [place], [place] [address]    Report Status 04/11/2017 FINAL  Final  Urine Culture     Status:  None   Collection Time: 04/11/17  6:54 PM  Result Value Ref Range Status   Specimen Description URINE, SUPRAPUBIC CYSTOSCOPY  Final   Special Requests NONE  Final   Culture   Final    NO GROWTH Performed at [facility], [address] [place],  [address]    Report Status 04/13/2017 FINAL  Final    Coagulation Studies: No results for input(s): LABPROT, INR in the last 72 hours.  Urinalysis: No results for input(s): COLORURINE, LABSPEC, PHURINE, GLUCOSEU, HGBUR, BILIRUBINUR, KETONESUR, PROTEINUR, UROBILINOGEN, NITRITE, LEUKOCYTESUR in the last 72 hours.  Invalid input(s): APPERANCEUR    Imaging: No results found.   Medications:    . aspirin  81 mg Oral Daily  . atenolol  50 mg Oral Daily  . atorvastatin  10 mg Oral q1800  . ciprofloxacin  500 mg Oral Q24H  . clonazePAM  1 mg  Oral QHS  . insulin aspart  0-5 Units Subcutaneous QHS  . insulin aspart  0-9 Units Subcutaneous TID WC  . metroNIDAZOLE  500 mg Oral Q8H  . tamsulosin  0.4 mg Oral Daily  . tuberculin  5 Units Intradermal Once   acetaminophen **OR** acetaminophen, LORazepam, ondansetron **OR** ondansetron (ZOFRAN) IV, oxyCODONE, promethazine  Assessment/ Plan:  Mr. FABRIZIO FILIP is a 83 y.o. white male with hypertension, polycystic kidney disease, hyperlipidemia, diabetes mellitus type II insulin dependent, gout, BPH, allergic rhinitis, CLL, AAA, obstructive sleep apnea.   1. Acute renal failure with hematuria on chronic kidney disease stage III with proteinuria: with concern of ATN, obstructive uropathy and prerenal azotemia.  Urine output now nonoliguric Baseline creatinine of 1.5, GFR of 45 from 12/12/16. 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  - Hemodialysis treatment last two days.  - monitor daily for dialysis need.  - renally dose all medications.  - Discussed with patient and family - Appreciate Urology input.   2. Hypertension: blood pressure at goal.  - atenolol, tamsulosin  3. Diabetes mellitus type II: with chronic kidney disease: insulin dependent. Hemoglobin A1c 7% on 3/5. - continue glucose control.    LOS: [place], [place] Seaman 7/5/20182:39 PM

## 2017-04-13 NOTE — Progress Notes (Signed)
Patient said is isn't able to take heparin due to his CLL.  Dr Bridgett Larsson notified and gave order to discontinue the heparin

## 2017-04-13 NOTE — Progress Notes (Signed)
Patient c/o unable to sleep and nausea; Phenergan IVP given as ordered to relieve nausea and possibly assist with sleep. Barbaraann Faster, RN 11:44 PM 04/13/2017

## 2017-04-13 NOTE — Progress Notes (Signed)
Pharmacy Antibiotic Note  Patrick Payne is a 83 y.o. male admitted on 04/09/2017 with intra-abdominal infection.  Pharmacy has been consulted for ciprofloxacin dosing.  Plan: Day 5-  Cipro 500 mg PO q 24 hours ordered.     Height: 5\' 7"  (170.2 cm) Weight: 259 lb 9.6 oz (117.8 kg) IBW/kg (Calculated) : 66.1  Temp (24hrs), Avg:99.4 F (37.4 C), Min:99 F (37.2 C), Max:99.7 F (37.6 C)   Recent Labs Lab 04/09/17 1446 04/10/17 0411 04/11/17 0628 04/12/17 0507 04/13/17 0532  WBC 24.8* 13.3*  --  16.7* 15.5*  CREATININE 4.02* 4.89* 6.52* 6.27* 5.14*    Estimated Creatinine Clearance: 14.1 mL/min (A) (by C-G formula based on SCr of 5.14 mg/dL (H)).    Allergies  Allergen Reactions  . Celecoxib Other (See Comments)    Increased Blood Pressure  . Chlorpromazine Nausea Only  . Meloxicam Other (See Comments)    Hypertension  . Prednisone Other (See Comments)    Diabetic  . Amoxicillin-Pot Clavulanate Rash  . Penicillin V Potassium Rash    Antimicrobials this admission: Ciprofloxacin metronidazole  7/1 >>    >>   Dose adjustments this admission:   Microbiology results: 7/1 UCx: insignificant 7/3  UCx: NG  7/1 UA: LE(tr)  NO2(-) WBC 0-5 Thank you for allowing pharmacy to be a part of this patient's care.  Buel Molder A 04/13/2017 2:07 PM

## 2017-04-14 LAB — GLUCOSE, CAPILLARY
GLUCOSE-CAPILLARY: 128 mg/dL — AB (ref 65–99)
GLUCOSE-CAPILLARY: 155 mg/dL — AB (ref 65–99)
GLUCOSE-CAPILLARY: 146 mg/dL — AB (ref 65–99)
GLUCOSE-CAPILLARY: 143 mg/dL — AB (ref 65–99)

## 2017-04-14 LAB — RENAL FUNCTION PANEL
ANION GAP: 9 (ref 5–15)
Albumin: 2.4 g/dL — ABNORMAL LOW (ref 3.5–5.0)
BUN: 54 mg/dL — ABNORMAL HIGH (ref 6–20)
CHLORIDE: 98 mmol/L — AB (ref 101–111)
CO2: 26 mmol/L (ref 22–32)
CREATININE: 5.6 mg/dL — AB (ref 0.61–1.24)
Calcium: 7 mg/dL — ABNORMAL LOW (ref 8.9–10.3)
GFR, EST AFRICAN AMERICAN: 10 mL/min — AB (ref >60)
GFR, EST NON AFRICAN AMERICAN: 9 mL/min — AB (ref >60)
Glucose, Bld: 139 mg/dL — ABNORMAL HIGH (ref 65–99)
Phosphorus: 4.9 mg/dL — ABNORMAL HIGH (ref 2.5–4.6)
Potassium: 3.7 mmol/L (ref 3.5–5.1)
Sodium: 133 mmol/L — ABNORMAL LOW (ref 135–145)

## 2017-04-14 NOTE — Progress Notes (Signed)
Pt on home CPAP. CPAP plugged into red outlet.  

## 2017-04-14 NOTE — Progress Notes (Signed)
[facility]  ROUNDING NOTE   Subjective:   Wife and son at bedside.  UOP 775 (550)  Creatinine 5.6 (5.14) (6.27)  Cipro and metronidazole  Objective:  Vital signs in last 24 hours:  Temp:  [98.7 F (37.1 C)-99 F (37.2 C)] 98.8 F (37.1 C) (07/06 0444) Pulse Rate:  [57-67] 67 (07/06 0444) Resp:  [18-20] 18 (07/06 0444) BP: (135-145)/(58-65) 135/65 (07/06 0444) SpO2:  [93 %-97 %] 95 % (07/06 0444)  Weight change: -1.246 kg (-2 lb 12 oz) Filed Weights   04/12/17 1010 04/12/17 1308 04/13/17 1214  Weight: 119 kg (262 lb 5.6 oz) 117.1 kg (258 lb 2.5 oz) 117.8 kg (259 lb 9.6 oz)    Intake/Output: I/O last 3 completed shifts: In: 240 [P.O.:240] Out: 1325 [Urine:1325]   Intake/Output this shift:  Total I/O In: 600 [P.O.:600] Out: -   Physical Exam: General: NAD, laying in bed  Head: Normocephalic, atraumatic. Moist oral mucosal membranes  Eyes: Anicteric, PERRL  Neck: Supple, trachea midline  Lungs:  Clear to auscultation  Heart: Regular rate and rhythm  Abdomen:  Soft, nontender, +obese  Extremities: +peripheral edema.  Neurologic: Nonfocal, moving all four extremities  Skin: No lesions  GU: +bloody urine on foley    Basic Metabolic Panel:  Recent Labs Lab 04/10/17 0411 04/11/17 0628 04/12/17 0507 04/13/17 0532 04/14/17 0512  NA 133* 132* 133* 134* 133*  K 4.3 4.4 3.7 3.6 3.7  CL 106 105 100* 98* 98*  CO2 21* 17* 26 25 26   GLUCOSE 127* 163* 156* 122* 139*  BUN 51* 61* 56* 46* 54*  CREATININE 4.89* 6.52* 6.27* 5.14* 5.60*  CALCIUM 7.5* 7.3* 7.1* 7.1* 7.0*  PHOS  --  5.8* 4.7*  --  4.9*    Liver Function Tests:  Recent Labs Lab 04/09/17 1446 04/11/17 0628 04/12/17 0507 04/14/17 0512  AST 22  --   --   --   ALT 19  --   --   --   ALKPHOS 76  --   --   --   BILITOT 0.9  --   --   --   PROT 6.1*  --   --   --   ALBUMIN 3.7 3.1* 2.6* 2.4*    Recent Labs Lab 04/09/17 1446  LIPASE 22   No results for input(s): AMMONIA in the  last 168 hours.  CBC:  Recent Labs Lab 04/09/17 1446 04/10/17 0411 04/12/17 0507 04/13/17 0532  WBC 24.8* 13.3* 16.7* 15.5*  HGB 11.0* 9.5* 9.6* 8.9*  HCT 33.3* 29.0* 28.1* 26.5*  MCV 100.2* 100.0 100.1* 98.7  PLT 74* 57* 64* 70*    Cardiac Enzymes: No results for input(s): CKTOTAL, CKMB, CKMBINDEX, TROPONINI in the last 168 hours.  BNP: Invalid input(s): POCBNP  CBG:  Recent Labs Lab 04/13/17 1135 04/13/17 1630 04/13/17 2119 04/14/17 0749 04/14/17 1200  GLUCAP 125* 150* 156* 128* 155*    Microbiology: Results for orders placed or performed during the hospital encounter of 04/09/17  Urine Culture     Status: Abnormal   Collection Time: 04/09/17  7:24 PM  Result Value Ref Range Status   Specimen Description URINE, RANDOM  Final   Special Requests NONE  Final   Culture (A)  Final    <10,000 COLONIES/mL INSIGNIFICANT GROWTH Performed at [facility], [place] [address], [place], [place] [address]    Report Status 04/11/2017 FINAL  Final  Urine Culture     Status: None   Collection Time:  04/11/17  6:54 PM  Result Value Ref Range Status   Specimen Description URINE, SUPRAPUBIC CYSTOSCOPY  Final   Special Requests NONE  Final   Culture   Final    NO GROWTH Performed at [facility], [place] [address], [place], [place] [address]    Report Status 04/13/2017 FINAL  Final    Coagulation Studies: No results for input(s): LABPROT, INR in the last 72 hours.  Urinalysis: No results for input(s): COLORURINE, LABSPEC, PHURINE, GLUCOSEU, HGBUR, BILIRUBINUR, KETONESUR, PROTEINUR, UROBILINOGEN, NITRITE, LEUKOCYTESUR in the last 72 hours.  Invalid input(s): APPERANCEUR    Imaging: No results found.   Medications:    . aspirin  81 mg Oral Daily  . atenolol  50 mg Oral Daily  . atorvastatin  10 mg Oral q1800  . ciprofloxacin  500 mg Oral Q24H  . clonazePAM  1 mg Oral QHS  . insulin aspart  0-5 Units Subcutaneous QHS  . insulin aspart  0-9 Units  Subcutaneous TID WC  . metroNIDAZOLE  500 mg Oral Q8H  . tamsulosin  0.4 mg Oral Daily   acetaminophen **OR** acetaminophen, LORazepam, ondansetron **OR** ondansetron (ZOFRAN) IV, oxyCODONE, promethazine  Assessment/ Plan:  [name] is a 83 y.o. white male with hypertension, polycystic kidney disease, hyperlipidemia, diabetes mellitus type II insulin dependent, gout, BPH, allergic rhinitis, CLL, AAA, obstructive sleep apnea.   1. Acute renal failure with hematuria on chronic kidney disease stage III with proteinuria: with concern of ATN, obstructive uropathy and prerenal azotemia.  Urine output nonoliguric Baseline creatinine of 1.5, GFR of 45 from 12/12/16. 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  - Hemodialysis treatment x 2. No acute indication for dialysis today   - monitor daily for dialysis need.  - renally dose all medications.  - Discussed with patient and family - Appreciate Urology input.   2. Hypertension: blood pressure at goal.  - atenolol, tamsulosin  3. Diabetes mellitus type II: with chronic kidney disease: insulin dependent. Hemoglobin A1c 7% on 3/5. - continue glucose control.    LOS: [place], [place] 7/6/20181:27 PM

## 2017-04-14 NOTE — Care Management Important Message (Signed)
Important Message  Patient Details  Name: TAEGAN STANDAGE MRN: [id number] Date of Birth: [birth year]   Medicare Important Message Given:  Yes    Beverly Sessions, RN 04/14/2017, 3:33 PM

## 2017-04-14 NOTE — Care Management (Signed)
Patient would benefit from PT consult after removal of temp HD cath

## 2017-04-14 NOTE — Progress Notes (Signed)
[facility]                                                                                                                                                                                  Patient Demographics   Danie Diehl, is a 83 y.o. male, DOB - 31-Mar-1934, CHE:[id number]  Admit date - 04/09/2017   Admitting Physician Lance Coon, MD  Outpatient Primary MD for the patient is Idelle Crouch, MD   LOS - 5  Subjective: Patient seen in dialysis Patient underwent Cystoscopy left retrograde pyelogram and left ureteral stent placement, Foley catheter. The patient had nausea last night. Review of Systems:   CONSTITUTIONAL: No documented fever. No fatigue, weakness. No weight gain, no weight loss.  EYES: No blurry or double vision.  ENT: No tinnitus. No postnasal drip. No redness of the oropharynx.  RESPIRATORY: No cough, no wheeze, no hemoptysis. No dyspnea.  CARDIOVASCULAR: No chest pain. No orthopnea. No palpitations. No syncope.  GASTROINTESTINAL: No nausea, no vomiting or diarrhea. No abdominal pain. No melena or hematochezia.  GENITOURINARY: No dysuria or hematuria.  ENDOCRINE: No polyuria or nocturia. No heat or cold intolerance.  HEMATOLOGY: No anemia. No bruising. No bleeding.  INTEGUMENTARY: No rashes. No lesions.  MUSCULOSKELETAL: No arthritis. No swelling. No gout.  NEUROLOGIC: No numbness, tingling, or ataxia. No seizure-type activity.  PSYCHIATRIC: No anxiety. No insomnia. No ADD.    Vitals:   Vitals:   04/13/17 1214 04/13/17 1451 04/13/17 2122 04/14/17 0444  BP:  (!) 145/58 136/63 135/65  Pulse:  (!) 57 62 67  Resp:   20 18  Temp:  98.7 F (37.1 C) 99 F (37.2 C) 98.8 F (37.1 C)  TempSrc:  Oral Oral Oral  SpO2:  97% 93% 95%  Weight: 259 lb 9.6 oz (117.8 kg)     Height:        Wt Readings from Last 3 Encounters:  04/13/17 259 lb 9.6 oz (117.8 kg)  03/29/17 248 lb 8 oz (112.7 kg)  08/26/16 248 lb (112.5 kg)      Intake/Output Summary (Last 24 hours) at 04/14/17 1502 Last data filed at 04/14/17 1318  Gross per 24 hour  Intake              600 ml  Output              525 ml  Net               75 ml    Physical Exam:   GENERAL: Pleasant-appearing in no apparent distress. Obese. HEAD, EYES, EARS, NOSE AND THROAT: Atraumatic, normocephalic. Extraocular muscles are intact. Pupils  equal and reactive to light. Sclerae anicteric. No conjunctival injection. No oro-pharyngeal erythema.  NECK: Supple. There is no jugular venous distention. No bruits, no lymphadenopathy, no thyromegaly.  HEART: Regular rate and rhythm,. No murmurs, no rubs, no clicks.  LUNGS: Clear to auscultation bilaterally. No rales or rhonchi. No wheezes.  ABDOMEN: Soft, flat, nontender, nondistended. Has good bowel sounds. No hepatosplenomegaly appreciated.  EXTREMITIES: No evidence of any cyanosis, clubbing, but bilateral leg edema 1-2+.  +2 pedal and radial pulses bilaterally.  NEUROLOGIC: The patient is alert, awake, and oriented x3 with no focal motor or sensory deficits appreciated bilaterally.  SKIN: Moist and warm with no rashes appreciated.  Psych: Not anxious, depressed LN: No inguinal LN enlargement    Antibiotics   Anti-infectives    Start     Dose/Rate Route Frequency Ordered Stop   04/11/17 1800  ceFAZolin (ANCEF) IVPB 1 g/50 mL premix     1 g 100 mL/hr over 30 Minutes Intravenous  Once 04/11/17 1418 04/11/17 1905   04/10/17 1800  ciprofloxacin (CIPRO) tablet 500 mg     500 mg Oral Every 24 hours 04/09/17 2351     04/09/17 2215  metroNIDAZOLE (FLAGYL) tablet 500 mg     500 mg Oral Every 8 hours 04/09/17 2201     04/09/17 1730  ciprofloxacin (CIPRO) IVPB 400 mg     400 mg 200 mL/hr over 60 Minutes Intravenous  Once 04/09/17 1727 04/09/17 2006   04/09/17 1730  metroNIDAZOLE (FLAGYL) IVPB 500 mg     500 mg 100 mL/hr over 60 Minutes Intravenous  Once 04/09/17 1727 04/09/17 1852      Medications    Scheduled Meds: . aspirin  81 mg Oral Daily  . atenolol  50 mg Oral Daily  . atorvastatin  10 mg Oral q1800  . ciprofloxacin  500 mg Oral Q24H  . clonazePAM  1 mg Oral QHS  . insulin aspart  0-5 Units Subcutaneous QHS  . insulin aspart  0-9 Units Subcutaneous TID WC  . metroNIDAZOLE  500 mg Oral Q8H  . tamsulosin  0.4 mg Oral Daily   Continuous Infusions:  PRN Meds:.acetaminophen **OR** acetaminophen, LORazepam, ondansetron **OR** ondansetron (ZOFRAN) IV, oxyCODONE, promethazine   Data Review:   Micro Results Recent Results (from the past 240 hour(s))  Urine Culture     Status: Abnormal   Collection Time: 04/09/17  7:24 PM  Result Value Ref Range Status   Specimen Description URINE, RANDOM  Final   Special Requests NONE  Final   Culture (A)  Final    <10,000 COLONIES/mL INSIGNIFICANT GROWTH Performed at [facility], [address], [place], [place] [address]    Report Status 04/11/2017 FINAL  Final  Urine Culture     Status: None   Collection Time: 04/11/17  6:54 PM  Result Value Ref Range Status   Specimen Description URINE, SUPRAPUBIC CYSTOSCOPY  Final   Special Requests NONE  Final   Culture   Final    NO GROWTH Performed at [facility], [place] [address] [place], [place] [address]    Report Status 04/13/2017 FINAL  Final    Radiology Reports Ct Abdomen Pelvis Wo Contrast  Result Date: 04/09/2017 CLINICAL DATA:  Left lower quadrant pain.  Acute renal failure. EXAM: CT ABDOMEN AND PELVIS WITHOUT CONTRAST TECHNIQUE: Multidetector CT imaging of the abdomen and pelvis was performed following the standard protocol without IV contrast. COMPARISON:  CT 03/02/2017 FINDINGS: Lower chest: Linear atelectasis in the  left lower lobe. No pleural effusion. Coronary artery calcifications are seen. Hepatobiliary: Decreased hepatic density consistent with steatosis. Small subcentimeter hypodensity in the right lobe is unchanged but incompletely characterized. Gallbladder  is surgically absent, no biliary dilatation. Pancreas: Parenchymal atrophy. No ductal dilatation or inflammation. Spleen: Normal in size without focal abnormality. Adrenals/Urinary Tract: Normal adrenal glands. Enlargement and cystic replacement of the right kidney with innumerable cysts of varying sizes. Appearing density and some with thin calcifications. Overall appearance is unchanged from prior exam. The right ureter is nondilated. There is prominence of the left renal pelvis in ureter with left perinephric edema. No urolithiasis. Exophytic cyst from the lower left kidney is unchanged. Urinary bladder is completely decompressed. Stomach/Bowel: Stomach is physiologically distended without wall thickening. No small bowel dilatation, inflammation or obstruction. Normal appendix. Multifocal colonic diverticulosis throughout the entire colon. Possible early pericolonic inflammation and wall thickening at the distal descending colon. No perforation. No abscess. Vascular/Lymphatic: Aortic and branch atherosclerosis. Aneurysmal dilatation of the infrarenal aorta maximal dimension 3.9 cm, unchanged from prior exam. No periaortic soft tissue stranding to suggest rupture. Enlarged right external iliac node measures 2.2 cm series 2, image 83, unchanged from prior exam. Additional prominent and enlarged pelvic and retroperitoneal nodes are also stable. No progressive lymphadenopathy. Reproductive: Normal sized prostate gland. Other: Fat within both inguinal canals. Small fat containing supraumbilical ventral abdominal wall hernia contains only fat. No free air, free fluid, or intra-abdominal fluid collection. Musculoskeletal: Again seen degenerative change in the spine. Probable hemangioma within L1 vertebral body. Chronic deformity of the right anterior iliac crest. IMPRESSION: 1. Multifocal colonic diverticulosis. Possible early uncomplicated acute diverticulitis in the distal descending colon. 2. Prominence of the left  ureter and renal collecting system with increased perinephric edema, suspicious for urinary tract infection. No urolithiasis. 3. Polycystic disease of the right kidney, stable in noncontrast CT appearance. 4. Aortic atherosclerosis with infrarenal abdominal aortic aneurysm measuring 3.9 x 3.8 cm. Recommend followup by ultrasound in 2 years. This recommendation follows ACR consensus guidelines: White Paper of the ACR Incidental Findings Committee II on Vascular Findings. J Am Coll Radiol 2013; 10:789-794. 5. Hepatic steatosis. 6. Stable enlarged pelvic and retroperitoneal nodes. Electronically Signed   By: Jeb Levering M.D.   On: 04/09/2017 19:59   US Renal  Result Date: 04/11/2017 CLINICAL DATA:  Acute renal failure EXAM: RENAL / URINARY TRACT ULTRASOUND COMPLETE COMPARISON:  04/09/2017 FINDINGS: Right Kidney: Length: 23.3 cm. Changes consistent with polycystic kidney disease are noted. The largest of these cystic changes measures approximately 6.4 cm. No obstructive changes are noted. These changes are similar to that seen on recent CT. Left Kidney: Length: 14.4 cm. 1.8 cm cyst is noted within the left kidney. No obstructive changes are seen. Bladder: Bladder is decompressed IMPRESSION: Changes of polycystic disease within the right kidney. Single left renal cyst is noted. Electronically Signed   By: Inez Catalina M.D.   On: 04/11/2017 10:55     CBC  Recent Labs Lab 04/09/17 1446 04/10/17 0411 04/12/17 0507 04/13/17 0532  WBC 24.8* 13.3* 16.7* 15.5*  HGB 11.0* 9.5* 9.6* 8.9*  HCT 33.3* 29.0* 28.1* 26.5*  PLT 74* 57* 64* 70*  MCV 100.2* 100.0 100.1* 98.7  MCH 33.1 32.8 34.3* 32.9  MCHC 33.0 32.8 34.2 33.4  RDW 15.0* 15.1* 14.5 14.5    Chemistries   Recent Labs Lab 04/09/17 1446 04/10/17 0411 04/11/17 0628 04/12/17 0507 04/13/17 0532 04/14/17 0512  NA 134* 133* 132* 133* 134* 133*  K 4.5  4.3 4.4 3.7 3.6 3.7  CL 104 106 105 100* 98* 98*  CO2 24 21* 17* 26 25 26   GLUCOSE 165*  127* 163* 156* 122* 139*  BUN 45* 51* 61* 56* 46* 54*  CREATININE 4.02* 4.89* 6.52* 6.27* 5.14* 5.60*  CALCIUM 8.7* 7.5* 7.3* 7.1* 7.1* 7.0*  AST 22  --   --   --   --   --   ALT 19  --   --   --   --   --   ALKPHOS 76  --   --   --   --   --   BILITOT 0.9  --   --   --   --   --    ------------------------------------------------------------------------------------------------------------------ estimated creatinine clearance is 12.9 mL/min (A) (by C-G formula based on SCr of 5.6 mg/dL (H)). ------------------------------------------------------------------------------------------------------------------ No results for input(s): HGBA1C in the last 72 hours. ------------------------------------------------------------------------------------------------------------------ No results for input(s): CHOL, HDL, LDLCALC, TRIG, CHOLHDL, LDLDIRECT in the last 72 hours. ------------------------------------------------------------------------------------------------------------------ No results for input(s): TSH, T4TOTAL, T3FREE, THYROIDAB in the last 72 hours.  Invalid input(s): FREET3 ------------------------------------------------------------------------------------------------------------------ No results for input(s): VITAMINB12, FOLATE, FERRITIN, TIBC, IRON, RETICCTPCT in the last 72 hours.  Coagulation profile No results for input(s): INR, PROTIME in the last 168 hours.  No results for input(s): DDIMER in the last 72 hours.  Cardiac Enzymes No results for input(s): CKMB, TROPONINI, MYOGLOBIN in the last 168 hours.  Invalid input(s): CK ------------------------------------------------------------------------------------------------------------------ Invalid input(s): [place]  Patient is a 83 year old with acute on chronic renal failure 1. Acute on chronic renal failure (HCC) Stage III Felt to be due to due to ATN, obstructive uropathy and prerenal  azotemia Status post stent placement and Foley catheter placement status post hemodialysis Hemodialysis treatment last two days.  monitor daily for dialysis need, No acute indication for dialysis today per nephrology, Dr. Juleen China.  2. possible  Diverticulitis - seen on CT scan,  discontinue Cipro and Flagyl.  3.  Chronic lymphocytic leukemia (HCC) -WBC count going up and down check CBC in the morning  4.   Diabetes mellitus ([place]) - sliding scale insulin corresponding glucose checks Lantus on hold currently blood sugar acceptable  5. Essential hypertension : mycardi and  HCTZ on hold due to acute renal failure, continue atenolol.  6.low plt suspect related to CLL, stable.  All the records are reviewed and case discussed with Dr. Juleen China. Management plans discussed with the patient and his wife.     Code Status Orders        Start     Ordered   04/09/17 2202  Full code  Continuous     04/09/17 2201    Code Status History    Date Active Date Inactive Code Status Order ID Comments User Context   This patient has a current code status but no historical code status.    Advance Directive Documentation     Most Recent Value  Type of Advance Directive  Healthcare Power of Attorney, Living will  Pre-existing out of facility DNR order (yellow form or pink MOST form)  -  "MOST" Form in Place?  -           Consults  Urology and nephrology  DVT Prophylaxis  SCDs due to thrombocytopenia  Lab Results  Component Value Date   PLT 70 (L) 04/13/2017     Time Spent in minutes  33 min  Greater than 50% of time spent in care  coordination and counseling patient regarding the condition and plan of care.   Demetrios Loll M.D on 04/14/2017 at 3:02 PM  Between 7am to 6pm - Pager - [phone]  After 6pm go to [email or web address] - password [email or web address] [facility]   Office  [phone]

## 2017-04-14 NOTE — Progress Notes (Signed)
Pharmacy Antibiotic Note  Patrick Payne is a 83 y.o. male admitted on 04/09/2017 with intra-abdominal infection.  Pharmacy has been consulted for ciprofloxacin dosing.  Plan: Day 6-  Cipro 500 mg PO q 24 hours ordered.     Height: 5\' 7"  (170.2 cm) Weight: 259 lb 9.6 oz (117.8 kg) IBW/kg (Calculated) : 66.1  Temp (24hrs), Avg:98.8 F (37.1 C), Min:98.7 F (37.1 C), Max:99 F (37.2 C)   Recent Labs Lab 04/09/17 1446 04/10/17 0411 04/11/17 0628 04/12/17 0507 04/13/17 0532 04/14/17 0512  WBC 24.8* 13.3*  --  16.7* 15.5*  --   CREATININE 4.02* 4.89* 6.52* 6.27* 5.14* 5.60*    Estimated Creatinine Clearance: 12.9 mL/min (A) (by C-G formula based on SCr of 5.6 mg/dL (H)).    Allergies  Allergen Reactions  . Celecoxib Other (See Comments)    Increased Blood Pressure  . Chlorpromazine Nausea Only  . Meloxicam Other (See Comments)    Hypertension  . Prednisone Other (See Comments)    Diabetic  . Amoxicillin-Pot Clavulanate Rash  . Penicillin V Potassium Rash    Antimicrobials this admission: Ciprofloxacin/metronidazole  7/1 >>    Dose adjustments this admission:   Microbiology results: 7/1 UCx: insignificant 7/3  UCx: NG  7/1 UA: LE(tr)  NO2(-) WBC 0-5 Thank you for allowing pharmacy to be a part of this patient's care.  Rocky Morel 04/14/2017 11:02 AM

## 2017-04-15 LAB — BASIC METABOLIC PANEL
Anion gap: 8 (ref 5–15)
BUN: 56 mg/dL — ABNORMAL HIGH (ref 6–20)
CALCIUM: 7.1 mg/dL — AB (ref 8.9–10.3)
CO2: 27 mmol/L (ref 22–32)
CREATININE: 5.59 mg/dL — AB (ref 0.61–1.24)
Chloride: 100 mmol/L — ABNORMAL LOW (ref 101–111)
GFR calc non Af Amer: 9 mL/min — ABNORMAL LOW (ref >60)
GFR, EST AFRICAN AMERICAN: 10 mL/min — AB (ref >60)
Glucose, Bld: 115 mg/dL — ABNORMAL HIGH (ref 65–99)
Potassium: 3.5 mmol/L (ref 3.5–5.1)
SODIUM: 135 mmol/L (ref 135–145)

## 2017-04-15 LAB — CBC
HCT: 24.7 % — ABNORMAL LOW (ref 40.0–52.0)
Hemoglobin: 8.4 g/dL — ABNORMAL LOW (ref 13.0–18.0)
MCH: 34.2 pg — ABNORMAL HIGH (ref 26.0–34.0)
MCHC: 34.1 g/dL (ref 32.0–36.0)
MCV: 100.3 fL — ABNORMAL HIGH (ref 80.0–100.0)
PLATELETS: 73 10*3/uL — AB (ref 150–440)
RBC: 2.47 MIL/uL — ABNORMAL LOW (ref 4.40–5.90)
RDW: 14.2 % (ref 11.5–14.5)
WBC: 14 10*3/uL — ABNORMAL HIGH (ref 3.8–10.6)

## 2017-04-15 LAB — GLUCOSE, CAPILLARY
GLUCOSE-CAPILLARY: 138 mg/dL — AB (ref 65–99)
Glucose-Capillary: 105 mg/dL — ABNORMAL HIGH (ref 65–99)
Glucose-Capillary: 182 mg/dL — ABNORMAL HIGH (ref 65–99)
Glucose-Capillary: 173 mg/dL — ABNORMAL HIGH (ref 65–99)

## 2017-04-15 MED ORDER — HYDROCORTISONE 0.5 % EX OINT
TOPICAL_OINTMENT | Freq: Three times a day (TID) | CUTANEOUS | Status: DC | PRN
  Filled 2017-04-15 (×2): qty 28.35

## 2017-04-15 NOTE — Progress Notes (Signed)
Dr. Anselm Jungling (on call) was notified of rash to patient's back. A verbal order via telephone was given for hydrocortisone ointment three times daily as needed. Upon ordering a flag was received due to prednisone allergy. RN asked patient if he has ever used hydrocortisone ointment in the past.  The patient stated that he has and did not have any issues from use.

## 2017-04-15 NOTE — Progress Notes (Signed)
Wife notified RN of a rash on patient's back. RN visualized rash to only patient's back, starting at below his neck, down to his waist. Nothing was present on the face, chest, abdomen or extremities. Dr. Bridgett Larsson was paged.

## 2017-04-15 NOTE — Progress Notes (Signed)
[facility] at [facility]                                                                                                                                                                                  Patient Demographics   Patrick Payne, is a 83 y.o. male, DOB - 1934-05-21, GGY:[id number]  Admit date - 04/09/2017   Admitting Physician Lance Coon, MD  Outpatient Primary MD for the patient is Idelle Crouch, MD   LOS - 6  Subjective: Patient seen in dialysis Patient underwent Cystoscopy left retrograde pyelogram and left ureteral stent placement, Foley catheter. The patient has No complaint. Good urine output of about 2 L. Review of Systems:   CONSTITUTIONAL: No documented fever. No fatigue, weakness. No weight gain, no weight loss.  EYES: No blurry or double vision.  ENT: No tinnitus. No postnasal drip. No redness of the oropharynx.  RESPIRATORY: No cough, no wheeze, no hemoptysis. No dyspnea.  CARDIOVASCULAR: No chest pain. No orthopnea. No palpitations. No syncope.  GASTROINTESTINAL: No nausea, no vomiting or diarrhea. No abdominal pain. No melena or hematochezia.  GENITOURINARY: No dysuria or hematuria.  ENDOCRINE: No polyuria or nocturia. No heat or cold intolerance.  HEMATOLOGY: No anemia. No bruising. No bleeding.  INTEGUMENTARY: No rashes. No lesions.  MUSCULOSKELETAL: No arthritis. No swelling. No gout.  NEUROLOGIC: No numbness, tingling, or ataxia. No seizure-type activity.  PSYCHIATRIC: No anxiety. No insomnia. No ADD.    Vitals:   Vitals:   04/14/17 1728 04/14/17 2001 04/15/17 0611 04/15/17 1028  BP: (!) 141/50 (!) 155/66 134/60 (!) 138/58  Pulse: (!) 58 61 62   Resp:  20 19   Temp: 98.2 F (36.8 C) 98.3 F (36.8 C) 98.2 F (36.8 C)   TempSrc: Oral Oral Oral   SpO2: 99% 97% 93%   Weight:      Height:        Wt Readings from Last 3 Encounters:  04/13/17 259 lb 9.6 oz (117.8 kg)  03/29/17 248 lb 8 oz (112.7 kg)   08/26/16 248 lb (112.5 kg)     Intake/Output Summary (Last 24 hours) at 04/15/17 1153 Last data filed at 04/15/17 0930  Gross per 24 hour  Intake              720 ml  Output             2300 ml  Net            -1580 ml    Physical Exam:   GENERAL: Pleasant-appearing in no apparent distress. Obese. HEAD, EYES, EARS, NOSE AND THROAT: Atraumatic, normocephalic. Extraocular muscles are intact. Pupils equal  and reactive to light. Sclerae anicteric. No conjunctival injection. No oro-pharyngeal erythema.  NECK: Supple. There is no jugular venous distention. No bruits, no lymphadenopathy, no thyromegaly.  HEART: Regular rate and rhythm,. No murmurs, no rubs, no clicks.  LUNGS: Clear to auscultation bilaterally. No rales or rhonchi. No wheezes.  ABDOMEN: Soft, flat, nontender, nondistended. Has good bowel sounds. No hepatosplenomegaly appreciated.  EXTREMITIES: No evidence of any cyanosis, clubbing, but bilateral leg edema 1+.  +2 pedal and radial pulses bilaterally.  NEUROLOGIC: The patient is alert, awake, and oriented x3 with no focal motor or sensory deficits appreciated bilaterally.  SKIN: Moist and warm with no rashes appreciated.  Psych: Not anxious, depressed LN: No inguinal LN enlargement    Antibiotics   Anti-infectives    Start     Dose/Rate Route Frequency Ordered Stop   04/11/17 1800  ceFAZolin (ANCEF) IVPB 1 g/50 mL premix     1 g 100 mL/hr over 30 Minutes Intravenous  Once 04/11/17 1418 04/11/17 1905   04/10/17 1800  ciprofloxacin (CIPRO) tablet 500 mg  Status:  Discontinued     500 mg Oral Every 24 hours 04/09/17 2351 04/14/17 1536   04/09/17 2215  metroNIDAZOLE (FLAGYL) tablet 500 mg  Status:  Discontinued     500 mg Oral Every 8 hours 04/09/17 2201 04/14/17 1536   04/09/17 1730  ciprofloxacin (CIPRO) IVPB 400 mg     400 mg 200 mL/hr over 60 Minutes Intravenous  Once 04/09/17 1727 04/09/17 2006   04/09/17 1730  metroNIDAZOLE (FLAGYL) IVPB 500 mg     500 mg 100  mL/hr over 60 Minutes Intravenous  Once 04/09/17 1727 04/09/17 1852      Medications   Scheduled Meds: . aspirin  81 mg Oral Daily  . atenolol  50 mg Oral Daily  . atorvastatin  10 mg Oral q1800  . clonazePAM  1 mg Oral QHS  . insulin aspart  0-5 Units Subcutaneous QHS  . insulin aspart  0-9 Units Subcutaneous TID WC  . tamsulosin  0.4 mg Oral Daily   Continuous Infusions:  PRN Meds:.acetaminophen **OR** acetaminophen, LORazepam, ondansetron **OR** ondansetron (ZOFRAN) IV, oxyCODONE, promethazine   Data Review:   Micro Results Recent Results (from the past 240 hour(s))  Urine Culture     Status: Abnormal   Collection Time: 04/09/17  7:24 PM  Result Value Ref Range Status   Specimen Description URINE, RANDOM  Final   Special Requests NONE  Final   Culture (A)  Final    <10,000 COLONIES/mL INSIGNIFICANT GROWTH Performed at [facility], [address], [place], [place] [address]    Report Status 04/11/2017 FINAL  Final  Urine Culture     Status: None   Collection Time: 04/11/17  6:54 PM  Result Value Ref Range Status   Specimen Description URINE, SUPRAPUBIC CYSTOSCOPY  Final   Special Requests NONE  Final   Culture   Final    NO GROWTH Performed at [facility], [place] [address], [place], [place] [address]    Report Status 04/13/2017 FINAL  Final    Radiology Reports Ct Abdomen Pelvis Wo Contrast  Result Date: 04/09/2017 CLINICAL DATA:  Left lower quadrant pain.  Acute renal failure. EXAM: CT ABDOMEN AND PELVIS WITHOUT CONTRAST TECHNIQUE: Multidetector CT imaging of the abdomen and pelvis was performed following the standard protocol without IV contrast. COMPARISON:  CT 03/02/2017 FINDINGS: Lower chest: Linear atelectasis in the left lower lobe. No pleural effusion. Coronary artery calcifications  are seen. Hepatobiliary: Decreased hepatic density consistent with steatosis. Small subcentimeter hypodensity in the right lobe is unchanged but incompletely  characterized. Gallbladder is surgically absent, no biliary dilatation. Pancreas: Parenchymal atrophy. No ductal dilatation or inflammation. Spleen: Normal in size without focal abnormality. Adrenals/Urinary Tract: Normal adrenal glands. Enlargement and cystic replacement of the right kidney with innumerable cysts of varying sizes. Appearing density and some with thin calcifications. Overall appearance is unchanged from prior exam. The right ureter is nondilated. There is prominence of the left renal pelvis in ureter with left perinephric edema. No urolithiasis. Exophytic cyst from the lower left kidney is unchanged. Urinary bladder is completely decompressed. Stomach/Bowel: Stomach is physiologically distended without wall thickening. No small bowel dilatation, inflammation or obstruction. Normal appendix. Multifocal colonic diverticulosis throughout the entire colon. Possible early pericolonic inflammation and wall thickening at the distal descending colon. No perforation. No abscess. Vascular/Lymphatic: Aortic and branch atherosclerosis. Aneurysmal dilatation of the infrarenal aorta maximal dimension 3.9 cm, unchanged from prior exam. No periaortic soft tissue stranding to suggest rupture. Enlarged right external iliac node measures 2.2 cm series 2, image 83, unchanged from prior exam. Additional prominent and enlarged pelvic and retroperitoneal nodes are also stable. No progressive lymphadenopathy. Reproductive: Normal sized prostate gland. Other: Fat within both inguinal canals. Small fat containing supraumbilical ventral abdominal wall hernia contains only fat. No free air, free fluid, or intra-abdominal fluid collection. Musculoskeletal: Again seen degenerative change in the spine. Probable hemangioma within L1 vertebral body. Chronic deformity of the right anterior iliac crest. IMPRESSION: 1. Multifocal colonic diverticulosis. Possible early uncomplicated acute diverticulitis in the distal descending colon.  2. Prominence of the left ureter and renal collecting system with increased perinephric edema, suspicious for urinary tract infection. No urolithiasis. 3. Polycystic disease of the right kidney, stable in noncontrast CT appearance. 4. Aortic atherosclerosis with infrarenal abdominal aortic aneurysm measuring 3.9 x 3.8 cm. Recommend followup by ultrasound in 2 years. This recommendation follows ACR consensus guidelines: White Paper of the ACR Incidental Findings Committee II on Vascular Findings. J Am Coll Radiol 2013; 10:789-794. 5. Hepatic steatosis. 6. Stable enlarged pelvic and retroperitoneal nodes. Electronically Signed   By: Jeb Levering M.D.   On: 04/09/2017 19:59   US Renal  Result Date: 04/11/2017 CLINICAL DATA:  Acute renal failure EXAM: RENAL / URINARY TRACT ULTRASOUND COMPLETE COMPARISON:  04/09/2017 FINDINGS: Right Kidney: Length: 23.3 cm. Changes consistent with polycystic kidney disease are noted. The largest of these cystic changes measures approximately 6.4 cm. No obstructive changes are noted. These changes are similar to that seen on recent CT. Left Kidney: Length: 14.4 cm. 1.8 cm cyst is noted within the left kidney. No obstructive changes are seen. Bladder: Bladder is decompressed IMPRESSION: Changes of polycystic disease within the right kidney. Single left renal cyst is noted. Electronically Signed   By: Inez Catalina M.D.   On: 04/11/2017 10:55     CBC  Recent Labs Lab 04/09/17 1446 04/10/17 0411 04/12/17 0507 04/13/17 0532 04/15/17 0429  WBC 24.8* 13.3* 16.7* 15.5* 14.0*  HGB 11.0* 9.5* 9.6* 8.9* 8.4*  HCT 33.3* 29.0* 28.1* 26.5* 24.7*  PLT 74* 57* 64* 70* 73*  MCV 100.2* 100.0 100.1* 98.7 100.3*  MCH 33.1 32.8 34.3* 32.9 34.2*  MCHC 33.0 32.8 34.2 33.4 34.1  RDW 15.0* 15.1* 14.5 14.5 14.2    Chemistries   Recent Labs Lab 04/09/17 1446  04/11/17 0628 04/12/17 0507 04/13/17 0532 04/14/17 0512 04/15/17 0429  NA 134*  < > 132* 133* 134*  133* 135  K 4.5  <  > 4.4 3.7 3.6 3.7 3.5  CL 104  < > 105 100* 98* 98* 100*  CO2 24  < > 17* 26 25 26 27   GLUCOSE 165*  < > 163* 156* 122* 139* 115*  BUN 45*  < > 61* 56* 46* 54* 56*  CREATININE 4.02*  < > 6.52* 6.27* 5.14* 5.60* 5.59*  CALCIUM 8.7*  < > 7.3* 7.1* 7.1* 7.0* 7.1*  AST 22  --   --   --   --   --   --   ALT 19  --   --   --   --   --   --   ALKPHOS 76  --   --   --   --   --   --   BILITOT 0.9  --   --   --   --   --   --   < > = values in this interval not displayed. ------------------------------------------------------------------------------------------------------------------ estimated creatinine clearance is 12.9 mL/min (A) (by C-G formula based on SCr of 5.59 mg/dL (H)). ------------------------------------------------------------------------------------------------------------------ No results for input(s): HGBA1C in the last 72 hours. ------------------------------------------------------------------------------------------------------------------ No results for input(s): CHOL, HDL, LDLCALC, TRIG, CHOLHDL, LDLDIRECT in the last 72 hours. ------------------------------------------------------------------------------------------------------------------ No results for input(s): TSH, T4TOTAL, T3FREE, THYROIDAB in the last 72 hours.  Invalid input(s): FREET3 ------------------------------------------------------------------------------------------------------------------ No results for input(s): VITAMINB12, FOLATE, FERRITIN, TIBC, IRON, RETICCTPCT in the last 72 hours.  Coagulation profile No results for input(s): INR, PROTIME in the last 168 hours.  No results for input(s): DDIMER in the last 72 hours.  Cardiac Enzymes No results for input(s): CKMB, TROPONINI, MYOGLOBIN in the last 168 hours.  Invalid input(s): CK ------------------------------------------------------------------------------------------------------------------ Invalid input(s): [place]   Patient is a 83 year old with acute on chronic renal failure 1. Acute on chronic renal failure (HCC) Stage III Felt to be due to due to ATN, obstructive uropathy and prerenal azotemia Status post stent placement and Foley catheter placement status post hemodialysis Hemodialysis treatment last two days.  monitor daily for dialysis need, No acute indication for dialysis today per nephrology, Dr. Juleen China. May remove temporary dialysis catheter tomorrow.  2. possible  Diverticulitis - seen on CT scan,  discontinue Cipro and Flagyl.  3.  Chronic lymphocytic leukemia (HCC) -WBC count going up and down check CBC in the morning  4.   Diabetes mellitus ([place]) - sliding scale insulin corresponding glucose checks Lantus on hold currently blood sugar acceptable  5. Essential hypertension : mycardi and  HCTZ on hold due to acute renal failure, continue atenolol.  6.low plt suspect related to CLL, stable.  All the records are reviewed and case discussed with Dr. Juleen China. Management plans discussed with the patient and his wife.     Code Status Orders        Start     Ordered   04/09/17 2202  Full code  Continuous     04/09/17 2201    Code Status History    Date Active Date Inactive Code Status Order ID Comments User Context   This patient has a current code status but no historical code status.    Advance Directive Documentation     Most Recent Value  Type of Advance Directive  Healthcare Power of Attorney, Living will  Pre-existing out of facility DNR order (yellow form or pink MOST form)  -  "MOST" Form in Place?  -  Consults  Urology and nephrology  DVT Prophylaxis  SCDs due to thrombocytopenia  Lab Results  Component Value Date   PLT 73 (L) 04/15/2017     Time Spent in minutes  27 min  Greater than 50% of time spent in care coordination and counseling patient regarding the condition and plan of care.   Demetrios Loll M.D on 04/15/2017 at 11:53 AM  Between 7am  to 6pm - Pager - [phone]  After 6pm go to [email or web address] - password [email or web address] [facility]   Office  [phone]

## 2017-04-15 NOTE — Progress Notes (Signed)
[facility]  ROUNDING NOTE   Subjective:   Wife and son at bedside.  UOP 2300 (775) (550)  Creatinine 5.59 (5.6) (5.14) (6.27)  Reports no more nausea  Objective:  Vital signs in last 24 hours:  Temp:  [98.2 F (36.8 C)-98.3 F (36.8 C)] 98.2 F (36.8 C) (07/07 0611) Pulse Rate:  [58-62] 62 (07/07 0611) Resp:  [19-20] 19 (07/07 0611) BP: (134-155)/(50-66) 138/58 (07/07 1028) SpO2:  [93 %-99 %] 93 % (07/07 0611)  Weight change:  Filed Weights   04/12/17 1010 04/12/17 1308 04/13/17 1214  Weight: 119 kg (262 lb 5.6 oz) 117.1 kg (258 lb 2.5 oz) 117.8 kg (259 lb 9.6 oz)    Intake/Output: I/O last 3 completed shifts: In: 840 [P.O.:840] Out: 2675 [Urine:2675]   Intake/Output this shift:  Total I/O In: 240 [P.O.:240] Out: -   Physical Exam: General: NAD, laying in bed  Head: Normocephalic, atraumatic. Moist oral mucosal membranes  Eyes: Anicteric, PERRL  Neck: Supple, trachea midline  Lungs:  Clear to auscultation  Heart: Regular rate and rhythm  Abdomen:  Soft, nontender, +obese  Extremities: +peripheral edema.  Neurologic: Nonfocal, moving all four extremities  Skin: No lesions  GU: +bloody urine on foley    Basic Metabolic Panel:  Recent Labs Lab 04/11/17 0628 04/12/17 0507 04/13/17 0532 04/14/17 0512 04/15/17 0429  NA 132* 133* 134* 133* 135  K 4.4 3.7 3.6 3.7 3.5  CL 105 100* 98* 98* 100*  CO2 17* 26 25 26 27   GLUCOSE 163* 156* 122* 139* 115*  BUN 61* 56* 46* 54* 56*  CREATININE 6.52* 6.27* 5.14* 5.60* 5.59*  CALCIUM 7.3* 7.1* 7.1* 7.0* 7.1*  PHOS 5.8* 4.7*  --  4.9*  --     Liver Function Tests:  Recent Labs Lab 04/09/17 1446 04/11/17 0628 04/12/17 0507 04/14/17 0512  AST 22  --   --   --   ALT 19  --   --   --   ALKPHOS 76  --   --   --   BILITOT 0.9  --   --   --   PROT 6.1*  --   --   --   ALBUMIN 3.7 3.1* 2.6* 2.4*    Recent Labs Lab 04/09/17 1446  LIPASE 22   No results for input(s): AMMONIA in the last 168  hours.  CBC:  Recent Labs Lab 04/09/17 1446 04/10/17 0411 04/12/17 0507 04/13/17 0532 04/15/17 0429  WBC 24.8* 13.3* 16.7* 15.5* 14.0*  HGB 11.0* 9.5* 9.6* 8.9* 8.4*  HCT 33.3* 29.0* 28.1* 26.5* 24.7*  MCV 100.2* 100.0 100.1* 98.7 100.3*  PLT 74* 57* 64* 70* 73*    Cardiac Enzymes: No results for input(s): CKTOTAL, CKMB, CKMBINDEX, TROPONINI in the last 168 hours.  BNP: Invalid input(s): POCBNP  CBG:  Recent Labs Lab 04/14/17 0749 04/14/17 1200 04/14/17 1639 04/14/17 2128 04/15/17 0747  GLUCAP 128* 155* 146* 143* 105*    Microbiology: Results for orders placed or performed during the hospital encounter of 04/09/17  Urine Culture     Status: Abnormal   Collection Time: 04/09/17  7:24 PM  Result Value Ref Range Status   Specimen Description URINE, RANDOM  Final   Special Requests NONE  Final   Culture (A)  Final    <10,000 COLONIES/mL INSIGNIFICANT GROWTH Performed at [facility], [place] [address], [place], [place] [address]    Report Status 04/11/2017 FINAL  Final  Urine Culture     Status:  None   Collection Time: 04/11/17  6:54 PM  Result Value Ref Range Status   Specimen Description URINE, SUPRAPUBIC CYSTOSCOPY  Final   Special Requests NONE  Final   Culture   Final    NO GROWTH Performed at [facility], [address], [place], [place] [address]    Report Status 04/13/2017 FINAL  Final    Coagulation Studies: No results for input(s): LABPROT, INR in the last 72 hours.  Urinalysis: No results for input(s): COLORURINE, LABSPEC, PHURINE, GLUCOSEU, HGBUR, BILIRUBINUR, KETONESUR, PROTEINUR, UROBILINOGEN, NITRITE, LEUKOCYTESUR in the last 72 hours.  Invalid input(s): APPERANCEUR    Imaging: No results found.   Medications:    . aspirin  81 mg Oral Daily  . atenolol  50 mg Oral Daily  . atorvastatin  10 mg Oral q1800  . clonazePAM  1 mg Oral QHS  . insulin aspart  0-5 Units Subcutaneous QHS  . insulin aspart  0-9 Units  Subcutaneous TID WC  . tamsulosin  0.4 mg Oral Daily   acetaminophen **OR** acetaminophen, LORazepam, ondansetron **OR** ondansetron (ZOFRAN) IV, oxyCODONE, promethazine  Assessment/ Plan:  Patrick Payne is a 83 y.o. white male with hypertension, polycystic kidney disease, hyperlipidemia, diabetes mellitus type II insulin dependent, gout, BPH, allergic rhinitis, CLL, AAA, obstructive sleep apnea.   1. Acute renal failure with hematuria on chronic kidney disease stage III with proteinuria: with concern of ATN, obstructive uropathy and prerenal azotemia.  Urine output nonoliguric Baseline creatinine of 1.5, GFR of 45 from 12/12/16. 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  - Hemodialysis treatment x 2 on 7/3 and 7/4.  - No acute indication for dialysis today. Monitor daily for dialysis need.  - renally dose all medications.  - Discussed with patient and family - Appreciate Urology input.   2. Hypertension: blood pressure at goal.  - atenolol, tamsulosin  3. Diabetes mellitus type II: with chronic kidney disease: insulin dependent. Hemoglobin A1c 7% on 3/5. - continue glucose control.    LOS: 6 Aquanetta Schwarz 7/7/201810:35 AM

## 2017-04-16 LAB — BASIC METABOLIC PANEL
ANION GAP: 8 (ref 5–15)
BUN: 56 mg/dL — ABNORMAL HIGH (ref 6–20)
CALCIUM: 7.3 mg/dL — AB (ref 8.9–10.3)
CO2: 27 mmol/L (ref 22–32)
Chloride: 102 mmol/L (ref 101–111)
Creatinine, Ser: 4.75 mg/dL — ABNORMAL HIGH (ref 0.61–1.24)
GFR, EST AFRICAN AMERICAN: 12 mL/min — AB (ref >60)
GFR, EST NON AFRICAN AMERICAN: 10 mL/min — AB (ref >60)
Glucose, Bld: 112 mg/dL — ABNORMAL HIGH (ref 65–99)
Potassium: 3.6 mmol/L (ref 3.5–5.1)
SODIUM: 137 mmol/L (ref 135–145)

## 2017-04-16 LAB — MAGNESIUM: Magnesium: 1.7 mg/dL (ref 1.7–2.4)

## 2017-04-16 LAB — GLUCOSE, CAPILLARY
GLUCOSE-CAPILLARY: 99 mg/dL (ref 65–99)
GLUCOSE-CAPILLARY: 162 mg/dL — AB (ref 65–99)
GLUCOSE-CAPILLARY: 175 mg/dL — AB (ref 65–99)
Glucose-Capillary: 168 mg/dL — ABNORMAL HIGH (ref 65–99)

## 2017-04-16 MED ORDER — MAGNESIUM SULFATE 2 GM/50ML IV SOLN
2.0000 g | Freq: Once | INTRAVENOUS | Status: AC
Start: 2017-04-16 — End: 2017-04-16
  Administered 2017-04-16: 2 g via INTRAVENOUS
  Filled 2017-04-16: qty 50

## 2017-04-16 MED ORDER — HYDROCORTISONE 0.5 % EX CREA
TOPICAL_CREAM | Freq: Three times a day (TID) | CUTANEOUS | Status: DC | PRN
  Administered 2017-04-16: 1 via TOPICAL
  Filled 2017-04-16: qty 28.35

## 2017-04-16 NOTE — Progress Notes (Signed)
Hemodialysis catheter was removed per order at 1450. The catheter was intact. Vaseline gauze and gauze was applied to site. Patient tolerated catheter removal well. Pressure was held for approximately 30 minutes. No bleeding noted at 30 minutes. Tegaderm was applied. Patient laid still for an additional 30 minutes. No distress noted.

## 2017-04-16 NOTE — Progress Notes (Signed)
[place] at [facility]                                                                                                                                                                                  Patient Demographics   Patrick Payne, is a 83 y.o. male, DOB - 01-Sep-1934, AGT:[id number]  Admit date - 04/09/2017   Admitting Physician Lance Coon, MD  Outpatient Primary MD for the patient is Idelle Crouch, MD   LOS - 7  Subjective: Patient seen in dialysis Patient underwent Cystoscopy left retrograde pyelogram and left ureteral stent placement, Foley catheter. The patient has No complaint. Good urine output. Review of Systems:   CONSTITUTIONAL: No documented fever. No fatigue, weakness. No weight gain, no weight loss.  EYES: No blurry or double vision.  ENT: No tinnitus. No postnasal drip. No redness of the oropharynx.  RESPIRATORY: No cough, no wheeze, no hemoptysis. No dyspnea.  CARDIOVASCULAR: No chest pain. No orthopnea. No palpitations. No syncope.  GASTROINTESTINAL: No nausea, no vomiting or diarrhea. No abdominal pain. No melena or hematochezia.  GENITOURINARY: No dysuria or hematuria.  ENDOCRINE: No polyuria or nocturia. No heat or cold intolerance.  HEMATOLOGY: No anemia. No bruising. No bleeding.  INTEGUMENTARY: No rashes. No lesions.  MUSCULOSKELETAL: No arthritis. No swelling. No gout.  NEUROLOGIC: No numbness, tingling, or ataxia. No seizure-type activity.  PSYCHIATRIC: No anxiety. No insomnia. No ADD.    Vitals:   Vitals:   04/15/17 1345 04/15/17 2103 04/16/17 0518 04/16/17 0949  BP: 116/60 136/65 (!) 134/58 132/62  Pulse: 65 60 61   Resp: 17 18 18    Temp: 98.4 F (36.9 C) 99.2 F (37.3 C) 98.3 F (36.8 C)   TempSrc: Oral Oral Oral   SpO2: 94% 99% 95%   Weight:      Height:        Wt Readings from Last 3 Encounters:  04/13/17 259 lb 9.6 oz (117.8 kg)  03/29/17 248 lb 8 oz (112.7 kg)  08/26/16 248 lb (112.5 kg)      Intake/Output Summary (Last 24 hours) at 04/16/17 1141 Last data filed at 04/16/17 0947  Gross per 24 hour  Intake              840 ml  Output             2525 ml  Net            -1685 ml    Physical Exam:   GENERAL: Pleasant-appearing in no apparent distress. Obese. HEAD, EYES, EARS, NOSE AND THROAT: Atraumatic, normocephalic. Extraocular muscles are intact. Pupils equal and reactive to light. Sclerae anicteric. No  conjunctival injection. No oro-pharyngeal erythema.  NECK: Supple. There is no jugular venous distention. No bruits, no lymphadenopathy, no thyromegaly.  HEART: Regular rate and rhythm,. No murmurs, no rubs, no clicks.  LUNGS: Clear to auscultation bilaterally. No rales or rhonchi. No wheezes.  ABDOMEN: Soft, flat, nontender, nondistended. Has good bowel sounds. No hepatosplenomegaly appreciated.  EXTREMITIES: No evidence of any cyanosis, clubbing, but bilateral leg edema 1+.  +2 pedal and radial pulses bilaterally.  NEUROLOGIC: The patient is alert, awake, and oriented x3 with no focal motor or sensory deficits appreciated bilaterally.  SKIN: Moist and warm with no rashes appreciated.  Psych: Not anxious, depressed LN: No inguinal LN enlargement    Antibiotics   Anti-infectives    Start     Dose/Rate Route Frequency Ordered Stop   04/11/17 1800  ceFAZolin (ANCEF) IVPB 1 g/50 mL premix     1 g 100 mL/hr over 30 Minutes Intravenous  Once 04/11/17 1418 04/11/17 1905   04/10/17 1800  ciprofloxacin (CIPRO) tablet 500 mg  Status:  Discontinued     500 mg Oral Every 24 hours 04/09/17 2351 04/14/17 1536   04/09/17 2215  metroNIDAZOLE (FLAGYL) tablet 500 mg  Status:  Discontinued     500 mg Oral Every 8 hours 04/09/17 2201 04/14/17 1536   04/09/17 1730  ciprofloxacin (CIPRO) IVPB 400 mg     400 mg 200 mL/hr over 60 Minutes Intravenous  Once 04/09/17 1727 04/09/17 2006   04/09/17 1730  metroNIDAZOLE (FLAGYL) IVPB 500 mg     500 mg 100 mL/hr over 60 Minutes  Intravenous  Once 04/09/17 1727 04/09/17 1852      Medications   Scheduled Meds: . aspirin  81 mg Oral Daily  . atenolol  50 mg Oral Daily  . atorvastatin  10 mg Oral q1800  . clonazePAM  1 mg Oral QHS  . insulin aspart  0-5 Units Subcutaneous QHS  . insulin aspart  0-9 Units Subcutaneous TID WC  . tamsulosin  0.4 mg Oral Daily   Continuous Infusions:  PRN Meds:.acetaminophen **OR** acetaminophen, hydrocortisone cream, LORazepam, ondansetron **OR** ondansetron (ZOFRAN) IV, oxyCODONE, promethazine   Data Review:   Micro Results Recent Results (from the past 240 hour(s))  Urine Culture     Status: Abnormal   Collection Time: 04/09/17  7:24 PM  Result Value Ref Range Status   Specimen Description URINE, RANDOM  Final   Special Requests NONE  Final   Culture (A)  Final    <10,000 COLONIES/mL INSIGNIFICANT GROWTH Performed at [facility], [address] [place], [place] [address]    Report Status 04/11/2017 FINAL  Final  Urine Culture     Status: None   Collection Time: 04/11/17  6:54 PM  Result Value Ref Range Status   Specimen Description URINE, SUPRAPUBIC CYSTOSCOPY  Final   Special Requests NONE  Final   Culture   Final    NO GROWTH Performed at [facility], [place] [address], [place], [place] [address]    Report Status 04/13/2017 FINAL  Final    Radiology Reports Ct Abdomen Pelvis Wo Contrast  Result Date: 04/09/2017 CLINICAL DATA:  Left lower quadrant pain.  Acute renal failure. EXAM: CT ABDOMEN AND PELVIS WITHOUT CONTRAST TECHNIQUE: Multidetector CT imaging of the abdomen and pelvis was performed following the standard protocol without IV contrast. COMPARISON:  CT 03/02/2017 FINDINGS: Lower chest: Linear atelectasis in the left lower lobe. No pleural effusion. Coronary artery calcifications are seen. Hepatobiliary: Decreased hepatic  density consistent with steatosis. Small subcentimeter hypodensity in the right lobe is unchanged but incompletely  characterized. Gallbladder is surgically absent, no biliary dilatation. Pancreas: Parenchymal atrophy. No ductal dilatation or inflammation. Spleen: Normal in size without focal abnormality. Adrenals/Urinary Tract: Normal adrenal glands. Enlargement and cystic replacement of the right kidney with innumerable cysts of varying sizes. Appearing density and some with thin calcifications. Overall appearance is unchanged from prior exam. The right ureter is nondilated. There is prominence of the left renal pelvis in ureter with left perinephric edema. No urolithiasis. Exophytic cyst from the lower left kidney is unchanged. Urinary bladder is completely decompressed. Stomach/Bowel: Stomach is physiologically distended without wall thickening. No small bowel dilatation, inflammation or obstruction. Normal appendix. Multifocal colonic diverticulosis throughout the entire colon. Possible early pericolonic inflammation and wall thickening at the distal descending colon. No perforation. No abscess. Vascular/Lymphatic: Aortic and branch atherosclerosis. Aneurysmal dilatation of the infrarenal aorta maximal dimension 3.9 cm, unchanged from prior exam. No periaortic soft tissue stranding to suggest rupture. Enlarged right external iliac node measures 2.2 cm series 2, image 83, unchanged from prior exam. Additional prominent and enlarged pelvic and retroperitoneal nodes are also stable. No progressive lymphadenopathy. Reproductive: Normal sized prostate gland. Other: Fat within both inguinal canals. Small fat containing supraumbilical ventral abdominal wall hernia contains only fat. No free air, free fluid, or intra-abdominal fluid collection. Musculoskeletal: Again seen degenerative change in the spine. Probable hemangioma within L1 vertebral body. Chronic deformity of the right anterior iliac crest. IMPRESSION: 1. Multifocal colonic diverticulosis. Possible early uncomplicated acute diverticulitis in the distal descending colon.  2. Prominence of the left ureter and renal collecting system with increased perinephric edema, suspicious for urinary tract infection. No urolithiasis. 3. Polycystic disease of the right kidney, stable in noncontrast CT appearance. 4. Aortic atherosclerosis with infrarenal abdominal aortic aneurysm measuring 3.9 x 3.8 cm. Recommend followup by ultrasound in 2 years. This recommendation follows ACR consensus guidelines: White Paper of the ACR Incidental Findings Committee II on Vascular Findings. J Am Coll Radiol 2013; 10:789-794. 5. Hepatic steatosis. 6. Stable enlarged pelvic and retroperitoneal nodes. Electronically Signed   By: Jeb Levering M.D.   On: 04/09/2017 19:59   US Renal  Result Date: 04/11/2017 CLINICAL DATA:  Acute renal failure EXAM: RENAL / URINARY TRACT ULTRASOUND COMPLETE COMPARISON:  04/09/2017 FINDINGS: Right Kidney: Length: 23.3 cm. Changes consistent with polycystic kidney disease are noted. The largest of these cystic changes measures approximately 6.4 cm. No obstructive changes are noted. These changes are similar to that seen on recent CT. Left Kidney: Length: 14.4 cm. 1.8 cm cyst is noted within the left kidney. No obstructive changes are seen. Bladder: Bladder is decompressed IMPRESSION: Changes of polycystic disease within the right kidney. Single left renal cyst is noted. Electronically Signed   By: Inez Catalina M.D.   On: 04/11/2017 10:55     CBC  Recent Labs Lab 04/09/17 1446 04/10/17 0411 04/12/17 0507 04/13/17 0532 04/15/17 0429  WBC 24.8* 13.3* 16.7* 15.5* 14.0*  HGB 11.0* 9.5* 9.6* 8.9* 8.4*  HCT 33.3* 29.0* 28.1* 26.5* 24.7*  PLT 74* 57* 64* 70* 73*  MCV 100.2* 100.0 100.1* 98.7 100.3*  MCH 33.1 32.8 34.3* 32.9 34.2*  MCHC 33.0 32.8 34.2 33.4 34.1  RDW 15.0* 15.1* 14.5 14.5 14.2    Chemistries   Recent Labs Lab 04/09/17 1446  04/12/17 0507 04/13/17 0532 04/14/17 0512 04/15/17 0429 04/16/17 0422  NA 134*  < > 133* 134* 133* 135 137  K 4.5  < >  3.7 3.6 3.7 3.5 3.6  CL 104  < > 100* 98* 98* 100* 102  CO2 24  < > 26 25 26 27 27   GLUCOSE 165*  < > 156* 122* 139* 115* 112*  BUN 45*  < > 56* 46* 54* 56* 56*  CREATININE 4.02*  < > 6.27* 5.14* 5.60* 5.59* 4.75*  CALCIUM 8.7*  < > 7.1* 7.1* 7.0* 7.1* 7.3*  MG  --   --   --   --   --   --  1.7  AST 22  --   --   --   --   --   --   ALT 19  --   --   --   --   --   --   ALKPHOS 76  --   --   --   --   --   --   BILITOT 0.9  --   --   --   --   --   --   < > = values in this interval not displayed. ------------------------------------------------------------------------------------------------------------------ estimated creatinine clearance is 15.2 mL/min (A) (by C-G formula based on SCr of 4.75 mg/dL (H)). ------------------------------------------------------------------------------------------------------------------ No results for input(s): HGBA1C in the last 72 hours. ------------------------------------------------------------------------------------------------------------------ No results for input(s): CHOL, HDL, LDLCALC, TRIG, CHOLHDL, LDLDIRECT in the last 72 hours. ------------------------------------------------------------------------------------------------------------------ No results for input(s): TSH, T4TOTAL, T3FREE, THYROIDAB in the last 72 hours.  Invalid input(s): FREET3 ------------------------------------------------------------------------------------------------------------------ No results for input(s): VITAMINB12, FOLATE, FERRITIN, TIBC, IRON, RETICCTPCT in the last 72 hours.  Coagulation profile No results for input(s): INR, PROTIME in the last 168 hours.  No results for input(s): DDIMER in the last 72 hours.  Cardiac Enzymes No results for input(s): CKMB, TROPONINI, MYOGLOBIN in the last 168 hours.  Invalid input(s): CK ------------------------------------------------------------------------------------------------------------------ Invalid input(s):  [place]  Patient is a 83 year old with acute on chronic renal failure 1. Acute on chronic renal failure (HCC) Stage III Felt to be due to due to ATN, obstructive uropathy and prerenal azotemia Status post stent placement and Foley catheter placement status post hemodialysis Hemodialysis treatment two days.   Renal function is improving, no need for dialysis, remove temporary dialysis catheter today per Dr. Juleen China.  2. possible  Diverticulitis - seen on CT scan,  discontinued Cipro and Flagyl.  3.  Chronic lymphocytic leukemia (HCC) -WBC count going up and down check CBC in the morning  4.   Diabetes mellitus ([place]) - sliding scale insulin corresponding glucose checks. Lantus on hold.  5. Essential hypertension : mycardi and  HCTZ on hold due to acute renal failure, continue atenolol.  6.low plt suspect related to CLL, stable.  All the records are reviewed and case discussed with Dr. Juleen China. Management plans discussed with the patient and his wife.     Code Status Orders        Start     Ordered   04/09/17 2202  Full code  Continuous     04/09/17 2201    Code Status History    Date Active Date Inactive Code Status Order ID Comments User Context   This patient has a current code status but no historical code status.    Advance Directive Documentation     Most Recent Value  Type of Advance Directive  Healthcare Power of Attorney, Living will  Pre-existing out of facility DNR order (yellow form or pink MOST form)  -  "MOST" Form in Place?  -  Consults  Urology and nephrology  DVT Prophylaxis  SCDs due to thrombocytopenia  Lab Results  Component Value Date   PLT 73 (L) 04/15/2017     Time Spent in minutes  27 min  Greater than 50% of time spent in care coordination and counseling patient regarding the condition and plan of care.   Demetrios Loll M.D on 04/16/2017 at 11:41 AM  Between 7am to 6pm - Pager - [phone]  After 6pm  go to [email or web address] - password [email or web address] [facility]   Office  [phone]

## 2017-04-16 NOTE — Evaluation (Signed)
Physical Therapy Evaluation Patient Details Name: Patrick Payne MRN: [id number] DOB: Jul 29, 1934 Today's Date: 04/16/2017   History of Present Illness  Patrick Payne  is a 83 y.o. male who presents with Left lower quadrant abdominal pain. He was diagnosed with acute on chronic renal failure and was started on dialysis. Patient also is s/p recent left ureteral stent with foly placement;   Clinical Impression  Patient is s/p hemodialysis cath removal with no active bleeding. He reports being mod I for self care ADLs prior to admittance. He lives with his wife who is available 24/7 to assist with ADLs as needed. Patient reports using a SPC for gait tasks prior to admittance for balance and gait safety; Currently he is supervision bed mobility. He requires CGA for sit<>Stand transfers with [facility]. He ambulated 12 feet in room with SPC with CGA exhibiting short step length and decreased gait speed. He does exhibit generalized weakness in BLE and overall has seen a decline in endurance. Patient would benefit from additional skilled PT intervention to improve strength, balance and gait safety; PT inspected catheter wound following session with no active bleeding. He denies any pain during session;     Follow Up Recommendations Home health PT;Supervision - Intermittent    Equipment Recommendations  None recommended by PT    Recommendations for Other Services Rehab consult     Precautions / Restrictions Restrictions Weight Bearing Restrictions: No      Mobility  Bed Mobility Overal bed mobility: Needs Assistance Bed Mobility: Supine to Sit;Sit to Supine     Supine to sit: Supervision;HOB elevated Sit to supine: Supervision;HOB elevated   General bed mobility comments: requires increased time and cues for hand/foot placement; Patient denies any pain with bed mobility; able to scoot mod I using bed rails;   Transfers Overall transfer level: Needs assistance Equipment used: Straight  cane Transfers: Sit to/from Stand Sit to Stand: Min guard         General transfer comment: sit<>stand from bed with SPC; Patient exhibits good safety awareness with cane/hand placement;   Ambulation/Gait Ambulation/Gait assistance: Min guard Ambulation Distance (Feet): 12 Feet Assistive device: Straight cane Gait Pattern/deviations: Step-through pattern;Decreased step length - right;Decreased step length - left;Decreased dorsiflexion - right;Decreased dorsiflexion - left Gait velocity: decreased   General Gait Details: decreased gait speed, decreased step length with SPC; able to ambulate in room with cane and not hold onto furniture. He does exhibit good safety awareness but fatigues quickly;   Stairs            Wheelchair Mobility    Modified Rankin (Stroke Patients Only)       Balance Overall balance assessment: Needs assistance Sitting-balance support: No upper extremity supported;Feet supported Sitting balance-Leahy Scale: Good Sitting balance - Comments: able to demonstrate good trunk control with no loss of balance with dynamic movement;    Standing balance support: Single extremity supported Standing balance-Leahy Scale: Fair Standing balance comment: requires CGA during gait tasks;                              Pertinent Vitals/Pain Pain Assessment: No/denies pain    Home Living Family/patient expects to be discharged to:: Private residence Living Arrangements: Spouse/significant other Available Help at Discharge: Family Type of Home: House Home Access: Stairs to enter Entrance Stairs-Rails: Right;Left;Can reach both Entrance Stairs-Number of Steps: 3 Home Layout: One level Home Equipment: Cane - single point;Shower seat -  built in      Prior Function Level of Independence: Independent with assistive device(s)         Comments: used SPC for gait in the home and outside home; was mostly mod I in self care ADLs; Did take longer time  to complete tasks and reports decreased balance over last several months with peripheral neuropathy secondary DM     Hand Dominance        Extremity/Trunk Assessment   Upper Extremity Assessment Upper Extremity Assessment: Overall WFL for tasks assessed    Lower Extremity Assessment Lower Extremity Assessment: Generalized weakness (grossly 4/5 BLE; decreased light touch in plantar surface of BLE feet; )    Cervical / Trunk Assessment Cervical / Trunk Assessment: Normal  Communication   Communication: No difficulties  Cognition Arousal/Alertness: Awake/alert Behavior During Therapy: WFL for tasks assessed/performed Overall Cognitive Status: Within Functional Limits for tasks assessed                                        General Comments General comments (skin integrity, edema, etc.): following gait, PT inspected catheter site with no active bleeding noted; pt denies any pain;     Exercises     Assessment/Plan    PT Assessment Patient needs continued PT services  PT Problem List Decreased strength;Decreased mobility;Decreased activity tolerance;Decreased balance;Impaired sensation       PT Treatment Interventions Therapeutic activities;Gait training;Therapeutic exercise;Patient/family education;Stair training;Balance training;Functional mobility training    PT Goals (Current goals can be found in the Care Plan section)  Acute Rehab PT Goals Patient Stated Goal: "I want to go home."  PT Goal Formulation: With patient Time For Goal Achievement: 04/30/17 Potential to Achieve Goals: Good    Frequency Min 2X/week   Barriers to discharge Inaccessible home environment has 3 steps to enter with B rails;     Co-evaluation               AM-PAC PT "6 Clicks" Daily Activity  Outcome Measure Difficulty turning over in bed (including adjusting bedclothes, sheets and blankets)?: A Lot Difficulty moving from lying on back to sitting on the side of the  bed? : A Lot Difficulty sitting down on and standing up from a chair with arms (e.g., wheelchair, bedside commode, etc,.)?: Total Help needed moving to and from a bed to chair (including a wheelchair)?: A Little Help needed walking in hospital room?: A Little Help needed climbing 3-5 steps with a railing? : A Lot 6 Click Score: 13    End of Session Equipment Utilized During Treatment: Gait belt Activity Tolerance: Patient tolerated treatment well;No increased pain Patient left: in bed;with call bell/phone within reach;with family/visitor present Nurse Communication: Mobility status PT Visit Diagnosis: Unsteadiness on feet (R26.81);Muscle weakness (generalized) (M62.81)    Time: 1610-9604 PT Time Calculation (min) (ACUTE ONLY): 12 min   Charges:   PT Evaluation $PT Eval Low Complexity: 1 Procedure     PT G Codes:   PT G-Codes **NOT FOR INPATIENT CLASS** Functional Assessment Tool Used: AM-PAC 6 Clicks Basic Mobility;Clinical judgement Functional Limitation: Mobility: Walking and moving around Mobility: Walking and Moving Around Current Status (V4098): At least 40 percent but less than 60 percent impaired, limited or restricted Mobility: Walking and Moving Around Goal Status [phone]): At least 20 percent but less than 40 percent impaired, limited or restricted      Junius Faucett PT, DPT 04/16/2017,  3:31 PM

## 2017-04-16 NOTE — Progress Notes (Signed)
[facility]  ROUNDING NOTE   Subjective:   Wife and son at bedside.  UOP 2225 (2300)   Creatinine 4.75 (5.59) (5.6) (5.14) (6.27)  Objective:  Vital signs in last 24 hours:  Temp:  [98.1 F (36.7 C)-99.2 F (37.3 C)] 98.1 F (36.7 C) (07/08 1146) Pulse Rate:  [56-65] 56 (07/08 1146) Resp:  [14-18] 14 (07/08 1146) BP: (116-142)/(57-65) 142/57 (07/08 1146) SpO2:  [94 %-99 %] 96 % (07/08 1146)  Weight change:  Filed Weights   04/12/17 1010 04/12/17 1308 04/13/17 1214  Weight: 119 kg (262 lb 5.6 oz) 117.1 kg (258 lb 2.5 oz) 117.8 kg (259 lb 9.6 oz)    Intake/Output: I/O last 3 completed shifts: In: 720 [P.O.:720] Out: 3275 [Urine:3275]   Intake/Output this shift:  Total I/O In: 360 [P.O.:360] Out: 300 [Urine:300]  Physical Exam: General: NAD, laying in bed  Head: Normocephalic, atraumatic. Moist oral mucosal membranes  Eyes: Anicteric, PERRL  Neck: Supple, trachea midline  Lungs:  Clear to auscultation  Heart: Regular rate and rhythm  Abdomen:  Soft, nontender, +obese  Extremities: +peripheral edema.  Neurologic: Nonfocal, moving all four extremities  Skin: No lesions  Access:  Right femoral temp HD catheter Dr. Delana Meyer 7/3  GU: + urine on foley    Basic Metabolic Panel:  Recent Labs Lab 04/11/17 0628 04/12/17 0507 04/13/17 0532 04/14/17 0512 04/15/17 0429 04/16/17 0422  NA 132* 133* 134* 133* 135 137  K 4.4 3.7 3.6 3.7 3.5 3.6  CL 105 100* 98* 98* 100* 102  CO2 17* 26 25 26 27 27   GLUCOSE 163* 156* 122* 139* 115* 112*  BUN 61* 56* 46* 54* 56* 56*  CREATININE 6.52* 6.27* 5.14* 5.60* 5.59* 4.75*  CALCIUM 7.3* 7.1* 7.1* 7.0* 7.1* 7.3*  MG  --   --   --   --   --  1.7  PHOS 5.8* 4.7*  --  4.9*  --   --     Liver Function Tests:  Recent Labs Lab 04/09/17 1446 04/11/17 0628 04/12/17 0507 04/14/17 0512  AST 22  --   --   --   ALT 19  --   --   --   ALKPHOS 76  --   --   --   BILITOT 0.9  --   --   --   PROT 6.1*  --   --   --    ALBUMIN 3.7 3.1* 2.6* 2.4*    Recent Labs Lab 04/09/17 1446  LIPASE 22   No results for input(s): AMMONIA in the last 168 hours.  CBC:  Recent Labs Lab 04/09/17 1446 04/10/17 0411 04/12/17 0507 04/13/17 0532 04/15/17 0429  WBC 24.8* 13.3* 16.7* 15.5* 14.0*  HGB 11.0* 9.5* 9.6* 8.9* 8.4*  HCT 33.3* 29.0* 28.1* 26.5* 24.7*  MCV 100.2* 100.0 100.1* 98.7 100.3*  PLT 74* 57* 64* 70* 73*    Cardiac Enzymes: No results for input(s): CKTOTAL, CKMB, CKMBINDEX, TROPONINI in the last 168 hours.  BNP: Invalid input(s): POCBNP  CBG:  Recent Labs Lab 04/15/17 1155 04/15/17 1709 04/15/17 2105 04/16/17 0745 04/16/17 1135  GLUCAP 138* 182* 173* 99 162*    Microbiology: Results for orders placed or performed during the hospital encounter of 04/09/17  Urine Culture     Status: Abnormal   Collection Time: 04/09/17  7:24 PM  Result Value Ref Range Status   Specimen Description URINE, RANDOM  Final   Special Requests NONE  Final   Culture (A)  Final    <10,000 COLONIES/mL INSIGNIFICANT GROWTH Performed at [facility] [address], [place], [place] [address]    Report Status 04/11/2017 FINAL  Final  Urine Culture     Status: None   Collection Time: 04/11/17  6:54 PM  Result Value Ref Range Status   Specimen Description URINE, SUPRAPUBIC CYSTOSCOPY  Final   Special Requests NONE  Final   Culture   Final    NO GROWTH Performed at [facility], [place] [address] [place], [place] [address]    Report Status 04/13/2017 FINAL  Final    Coagulation Studies: No results for input(s): LABPROT, INR in the last 72 hours.  Urinalysis: No results for input(s): COLORURINE, LABSPEC, PHURINE, GLUCOSEU, HGBUR, BILIRUBINUR, KETONESUR, PROTEINUR, UROBILINOGEN, NITRITE, LEUKOCYTESUR in the last 72 hours.  Invalid input(s): APPERANCEUR    Imaging: No results found.   Medications:    . aspirin  81 mg Oral Daily  . atenolol  50 mg Oral Daily  . atorvastatin  10  mg Oral q1800  . clonazePAM  1 mg Oral QHS  . insulin aspart  0-5 Units Subcutaneous QHS  . insulin aspart  0-9 Units Subcutaneous TID WC  . tamsulosin  0.4 mg Oral Daily   acetaminophen **OR** acetaminophen, hydrocortisone cream, LORazepam, ondansetron **OR** ondansetron (ZOFRAN) IV, oxyCODONE, promethazine  Assessment/ Plan:  [name] is a 83 y.o. white male with hypertension, polycystic kidney disease, hyperlipidemia, diabetes mellitus type II insulin dependent, gout, BPH, allergic rhinitis, CLL, AAA, obstructive sleep apnea. Admitted on 04/09/2017 with obstructive uropathy, hematuria and acute renal failure. Cystoscopy and ureteral stent placement by Dr. Junious Silk on 7/3.   1. Acute renal failure with hematuria on chronic kidney disease stage III with proteinuria: with concern of ATN, obstructive uropathy and prerenal azotemia.  Urine output nonoliguric Baseline creatinine of 1.5, GFR of 45 from 12/12/16. 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  - Hemodialysis treatment x 2 on 7/3 and 7/4.  - No acute indication for dialysis today. Remove dialysis catheter today.  - renally dose all medications.  - Discussed with patient and family - Appreciate Urology input.   2. Hypertension: blood pressure at goal.  - atenolol, tamsulosin  3. Diabetes mellitus type II: with chronic kidney disease: insulin dependent. Hemoglobin A1c 7% on 3/5. - continue glucose control.    LOS: [place], [place] 7/8/201811:47 AM

## 2017-04-16 NOTE — Progress Notes (Signed)
   04/16/17 1055  PT Visit Information  Last PT Received On 04/16/17  Reason Eval/Treat Not Completed Medical issues which prohibited therapy (Patient still waiting on HD Catheter removal; RN concerned about high creatinine and other labs; will wait on nephrologist consult later today; )  Norwood Levo. Barnet Glasgow PT, DPT

## 2017-04-17 LAB — RENAL FUNCTION PANEL
Albumin: 2.5 g/dL — ABNORMAL LOW (ref 3.5–5.0)
Anion gap: 8 (ref 5–15)
BUN: 54 mg/dL — AB (ref 6–20)
CHLORIDE: 105 mmol/L (ref 101–111)
CO2: 28 mmol/L (ref 22–32)
CREATININE: 4.18 mg/dL — AB (ref 0.61–1.24)
Calcium: 7.5 mg/dL — ABNORMAL LOW (ref 8.9–10.3)
GFR calc Af Amer: 14 mL/min — ABNORMAL LOW (ref >60)
GFR calc non Af Amer: 12 mL/min — ABNORMAL LOW (ref >60)
Glucose, Bld: 134 mg/dL — ABNORMAL HIGH (ref 65–99)
Phosphorus: 5.1 mg/dL — ABNORMAL HIGH (ref 2.5–4.6)
Potassium: 3.6 mmol/L (ref 3.5–5.1)
Sodium: 141 mmol/L (ref 135–145)

## 2017-04-17 LAB — GLUCOSE, CAPILLARY
GLUCOSE-CAPILLARY: 160 mg/dL — AB (ref 65–99)
Glucose-Capillary: 116 mg/dL — ABNORMAL HIGH (ref 65–99)
Glucose-Capillary: 157 mg/dL — ABNORMAL HIGH (ref 65–99)
Glucose-Capillary: 147 mg/dL — ABNORMAL HIGH (ref 65–99)

## 2017-04-17 NOTE — Progress Notes (Signed)
[facility]  ROUNDING NOTE   Subjective:   Patient's wife and daughter are at bedside. He states that overall he feels better.  He is able to eat without nausea and vomiting He walk in the hallways with physical therapy although he's tired after walking His serum creatinine has further improved to 4.18 today. Dialysis catheter as well as Foley catheter had been removed  Objective:  Vital signs in last 24 hours:  Temp:  [98.3 F (36.8 C)-99.6 F (37.6 C)] 98.4 F (36.9 C) (07/09 1158) Pulse Rate:  [53-68] 59 (07/09 1500) Resp:  [18-20] 18 (07/09 1158) BP: (141-172)/(59-66) 172/59 (07/09 1158) SpO2:  [92 %-95 %] 95 % (07/09 1500)  Weight change:  Filed Weights   04/12/17 1010 04/12/17 1308 04/13/17 1214  Weight: 119 kg (262 lb 5.6 oz) 117.1 kg (258 lb 2.5 oz) 117.8 kg (259 lb 9.6 oz)    Intake/Output: I/O last 3 completed shifts: In: 840 [P.O.:840] Out: 3100 [Urine:3100]   Intake/Output this shift:  Total I/O In: 720 [P.O.:720] Out: 850 [Urine:850]  Physical Exam: General: NAD, laying in bed  Head: Normocephalic, atraumatic. Moist oral mucosal membranes  Eyes: Anicteric,   Neck: Supple,   Lungs:  Clear to auscultation  Heart: Regular rate and rhythm  Abdomen:  Soft, nontender, +obese  Extremities: +peripheral edema.  Neurologic: Nonfocal, moving all four extremities  Skin: No lesions          Basic Metabolic Panel:  Recent Labs Lab 04/11/17 0628 04/12/17 0507 04/13/17 0532 04/14/17 0512 04/15/17 0429 04/16/17 0422 04/17/17 0413  NA 132* 133* 134* 133* 135 137 141  K 4.4 3.7 3.6 3.7 3.5 3.6 3.6  CL 105 100* 98* 98* 100* 102 105  CO2 17* 26 25 26 27 27 28   GLUCOSE 163* 156* 122* 139* 115* 112* 134*  BUN 61* 56* 46* 54* 56* 56* 54*  CREATININE 6.52* 6.27* 5.14* 5.60* 5.59* 4.75* 4.18*  CALCIUM 7.3* 7.1* 7.1* 7.0* 7.1* 7.3* 7.5*  MG  --   --   --   --   --  1.7  --   PHOS 5.8* 4.7*  --  4.9*  --   --  5.1*    Liver Function  Tests:  Recent Labs Lab 04/11/17 0628 04/12/17 0507 04/14/17 0512 04/17/17 0413  ALBUMIN 3.1* 2.6* 2.4* 2.5*   No results for input(s): LIPASE, AMYLASE in the last 168 hours. No results for input(s): AMMONIA in the last 168 hours.  CBC:  Recent Labs Lab 04/12/17 0507 04/13/17 0532 04/15/17 0429  WBC 16.7* 15.5* 14.0*  HGB 9.6* 8.9* 8.4*  HCT 28.1* 26.5* 24.7*  MCV 100.1* 98.7 100.3*  PLT 64* 70* 73*    Cardiac Enzymes: No results for input(s): CKTOTAL, CKMB, CKMBINDEX, TROPONINI in the last 168 hours.  BNP: Invalid input(s): POCBNP  CBG:  Recent Labs Lab 04/16/17 1651 04/16/17 2107 04/17/17 0739 04/17/17 1142 04/17/17 1641  GLUCAP 168* 175* 116* 157* 147*    Microbiology: Results for orders placed or performed during the hospital encounter of 04/09/17  Urine Culture     Status: Abnormal   Collection Time: 04/09/17  7:24 PM  Result Value Ref Range Status   Specimen Description URINE, RANDOM  Final   Special Requests NONE  Final   Culture (A)  Final    <10,000 COLONIES/mL INSIGNIFICANT GROWTH Performed at [facility], [place] [address], [place], [place] [address]    Report Status 04/11/2017 FINAL  Final  Urine Culture  Status: None   Collection Time: 04/11/17  6:54 PM  Result Value Ref Range Status   Specimen Description URINE, SUPRAPUBIC CYSTOSCOPY  Final   Special Requests NONE  Final   Culture   Final    NO GROWTH Performed at [facility], [address], [place], [place] [address]    Report Status 04/13/2017 FINAL  Final    Coagulation Studies: No results for input(s): LABPROT, INR in the last 72 hours.  Urinalysis: No results for input(s): COLORURINE, LABSPEC, PHURINE, GLUCOSEU, HGBUR, BILIRUBINUR, KETONESUR, PROTEINUR, UROBILINOGEN, NITRITE, LEUKOCYTESUR in the last 72 hours.  Invalid input(s): APPERANCEUR    Imaging: No results found.   Medications:    . aspirin  81 mg Oral Daily  . atenolol  50 mg Oral Daily  .  atorvastatin  10 mg Oral q1800  . clonazePAM  1 mg Oral QHS  . insulin aspart  0-5 Units Subcutaneous QHS  . insulin aspart  0-9 Units Subcutaneous TID WC  . tamsulosin  0.4 mg Oral Daily   acetaminophen **OR** acetaminophen, hydrocortisone cream, LORazepam, ondansetron **OR** ondansetron (ZOFRAN) IV, oxyCODONE, promethazine  Assessment/ Plan:  [name] is a 83 y.o. white male with hypertension, polycystic kidney disease, hyperlipidemia, diabetes mellitus type II insulin dependent, gout, BPH, allergic rhinitis, CLL, AAA, obstructive sleep apnea. Admitted on 04/09/2017 with obstructive uropathy, hematuria and acute renal failure. Cystoscopy and ureteral stent placement by Dr. Junious Silk on 7/3.   1. Acute renal failure with hematuria on chronic kidney disease stage III with proteinuria: with concern of ATN, obstructive uropathy and prerenal azotemia.  Urine output nonoliguric Baseline creatinine of 1.5, GFR of 45 from 12/12/16. 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  - Hemodialysis treatment x 2 on 7/3 and 7/4.  - No acute indication for dialysis today. Dialysis catheter has been removed - renally dose all medications.    2. Hypertension: blood pressure at goal.  - atenolol, tamsulosin  3. Diabetes mellitus type II: with chronic kidney disease: insulin dependent. Hemoglobin A1c 7% on 3/5. - continue glucose control.    LOS: 8 Patrick Payne 7/9/20185:19 PM

## 2017-04-17 NOTE — Progress Notes (Signed)
Chaplain rounding unit visited with pt to provide pastoral care. Pt was asleep and pt's family were at the bedside. Patrick Payne spoke with one of the family members and promised to visit pt later when he is awake. Patrick Payne will make a follow up visit with pt as needed.   04/17/17 1400  Clinical Encounter Type  Visited With Patient and family together  Visit Type Initial;Other (Comment)  Referral From Chaplain  Spiritual Encounters  Spiritual Needs Other (Comment)

## 2017-04-17 NOTE — Progress Notes (Signed)
Physical Therapy Treatment Patient Details Name: Patrick Payne MRN: [id number] DOB: 07/23/1934 Today's Date: 04/17/2017    History of Present Illness Patrick Payne  is a 83 y.o. male who presents with Left lower quadrant abdominal pain. He was diagnosed with acute on chronic renal failure and was started on dialysis. Patient also is s/p recent left ureteral stent with foly placement;     PT Comments    Attempted treatment 3 times prior; pt busy with lunch then other staff. Final attempt, pt agreeable to PT. Denies pain or shortness of breath, but notes fatigue from a long day. Spouse reports family member just walked pt in the hallway and pt has just returned to bed. Educated pt/spouse regarding safety and requested they have a staff member with pt should he wish to walk; they understand. Pt participates in supine bed exercises. Education provided on proper breathing techniques with exercises as well as focus on eccentric control, as pt initially demonstrates without control. Pt/spouse also educated on modifying/performing exercises in seated positions as well. Continue PT to progress strength, endurance to improve all functional mobility toward baseline.    Follow Up Recommendations  Home health PT;Supervision - Intermittent     Equipment Recommendations  None recommended by PT    Recommendations for Other Services       Precautions / Restrictions Precautions Precautions: Fall Restrictions Weight Bearing Restrictions: No    Mobility  Bed Mobility               General bed mobility comments: Not tested; pt preferred to remain in bed due to fatigue  Transfers                    Ambulation/Gait                 Stairs            Wheelchair Mobility    Modified Rankin (Stroke Patients Only)       Balance                                            Cognition Arousal/Alertness: Awake/alert (Notes fatigue) Behavior  During Therapy: WFL for tasks assessed/performed Overall Cognitive Status: Within Functional Limits for tasks assessed                                        Exercises General Exercises - Lower Extremity Ankle Circles/Pumps: AROM;Both;20 reps;Supine Quad Sets: Strengthening;Both;20 reps;Supine Gluteal Sets: Strengthening;Both;20 reps;Supine Short Arc Quad: AROM;Both;20 reps;Supine Heel Slides: AROM;Both;20 reps;Supine Hip ABduction/ADduction: AROM;Both;20 reps;Supine Straight Leg Raises: Strengthening;Both;10 reps;Supine Other Exercises Other Exercises: Pt/spouse also educated in how to perform exercises in seated and long sit positions    General Comments        Pertinent Vitals/Pain Pain Assessment: No/denies pain    Home Living                      Prior Function            PT Goals (current goals can now be found in the care plan section)      Frequency    Min 2X/week      PT Plan Current plan remains appropriate    Co-evaluation  AM-PAC PT "6 Clicks" Daily Activity  Outcome Measure  Difficulty turning over in bed (including adjusting bedclothes, sheets and blankets)?: A Little Difficulty moving from lying on back to sitting on the side of the bed? : A Little Difficulty sitting down on and standing up from a chair with arms (e.g., wheelchair, bedside commode, etc,.)?: A Lot Help needed moving to and from a bed to chair (including a wheelchair)?: A Little Help needed walking in hospital room?: A Little Help needed climbing 3-5 steps with a railing? : A Lot 6 Click Score: 16    End of Session   Activity Tolerance: Patient tolerated treatment well;No increased pain Patient left: in bed;with call bell/phone within reach;with family/visitor present   PT Visit Diagnosis: Unsteadiness on feet (R26.81);Muscle weakness (generalized) (M62.81)     Time: 4854-6270 PT Time Calculation (min) (ACUTE ONLY): 27  min  Charges:  $Therapeutic Exercise: 23-37 mins                    G Codes:  Functional Assessment Tool Used: AM-PAC 6 Clicks Basic Mobility;Clinical judgement     Larae Grooms, PTA 04/17/2017, 3:27 PM

## 2017-04-17 NOTE — Plan of Care (Signed)
Problem: Food- and Nutrition-Related Knowledge Deficit (NB-1.1) Goal: Nutrition education Formal process to instruct or train a patient/client in a skill or to impart knowledge to help patients/clients voluntarily manage or modify food choices and eating behavior to maintain or improve health. Outcome: Completed/Met Date Met: 04/17/17 Nutrition Education Note  Met with patient, his wife, and his daughter at bedside. Patient reports that earlier in admission he was NPO/CLD, but he is tolerating diet well now and eating well. Reports that at home he usually only eats two meals per day. For breakfast he enjoys English muffin with butter and sausage. For dinner he eats meat and potatoes or other vegetables. Or may go out to eat. He does report that he tends to eat a lot of candy and sweets between meals.  RD consulted for Renal Education. Provided "Chronic Kidney Disease Stages 1-4: Nutrition Guidelines" handout from the Academy of [employer] to patient/family. Encouraged good blood sugar control and discussed portion sizes of carbohydrate foods. Encouraged patient to follow low sodium diet. Reviewed food groups and provided written recommended serving sizes specifically determined for patient's current nutritional status.   Explained why diet restrictions are needed and provided lists of foods to limit/avoid that are high in sodium and phosphorus. Provided specific recommendations on safer alternatives of these foods. Strongly encouraged compliance of this diet. Teach back method used.  Expect good compliance.  Body mass index is 40.66 kg/m. Pt meets criteria for Obesity Class III based on current BMI.  Current diet order is Renal/Carbohydrate Modified, patient is consuming approximately 100% of meals at this time. Labs and medications reviewed. No further nutrition interventions warranted at this time. RD contact information provided. If additional nutrition issues arise, please  re-consult RD.  Willey Blade, MS, RD, LDN Pager: [phone] After Hours Pager: [phone]

## 2017-04-17 NOTE — Progress Notes (Signed)
[place] at [facility]                                                                                                                                                                                  Patient Demographics   Patrick Payne, is a 83 y.o. male, DOB - 06/04/1934, KGY:[id number]  Admit date - 04/09/2017   Admitting Physician Lance Coon, MD  Outpatient Primary MD for the patient is Idelle Crouch, MD   LOS - 8  Subjective: Patient seen in dialysis Patient underwent Cystoscopy left retrograde pyelogram and left ureteral stent placement, Foley catheter. The patient has No complaint. Good urine output. Review of Systems:   CONSTITUTIONAL: No documented fever. No fatigue, weakness. No weight gain, no weight loss.  EYES: No blurry or double vision.  ENT: No tinnitus. No postnasal drip. No redness of the oropharynx.  RESPIRATORY: No cough, no wheeze, no hemoptysis. No dyspnea.  CARDIOVASCULAR: No chest pain. No orthopnea. No palpitations. No syncope.  GASTROINTESTINAL: No nausea, no vomiting or diarrhea. No abdominal pain. No melena or hematochezia.  GENITOURINARY: No dysuria or hematuria.  ENDOCRINE: No polyuria or nocturia. No heat or cold intolerance.  HEMATOLOGY: No anemia. No bruising. No bleeding.  INTEGUMENTARY: No rashes. No lesions.  MUSCULOSKELETAL: No arthritis. No swelling. No gout.  NEUROLOGIC: No numbness, tingling, or ataxia. No seizure-type activity.  PSYCHIATRIC: No anxiety. No insomnia. No ADD.    Vitals:   Vitals:   04/16/17 1146 04/16/17 2059 04/17/17 0603 04/17/17 1158  BP: (!) 142/57 (!) 149/66 (!) 141/61 (!) 172/59  Pulse: (!) 56 68 (!) 53 60  Resp: 14 20 20 18   Temp: 98.1 F (36.7 C) 99.6 F (37.6 C) 98.3 F (36.8 C) 98.4 F (36.9 C)  TempSrc: Oral Oral Oral Oral  SpO2: 96% 93% 92% 95%  Weight:      Height:        Wt Readings from Last 3 Encounters:  04/13/17 259 lb 9.6 oz (117.8 kg)  03/29/17 248 lb  8 oz (112.7 kg)  08/26/16 248 lb (112.5 kg)     Intake/Output Summary (Last 24 hours) at 04/17/17 1243 Last data filed at 04/17/17 1000  Gross per 24 hour  Intake              720 ml  Output             1850 ml  Net            -1130 ml    Physical Exam:   GENERAL: Pleasant-appearing in no apparent distress. Obese. HEAD, EYES, EARS, NOSE AND THROAT: Atraumatic, normocephalic. Extraocular muscles are intact. Pupils  equal and reactive to light. Sclerae anicteric. No conjunctival injection. No oro-pharyngeal erythema.  NECK: Supple. There is no jugular venous distention. No bruits, no lymphadenopathy, no thyromegaly.  HEART: Regular rate and rhythm,. No murmurs, no rubs, no clicks.  LUNGS: Clear to auscultation bilaterally. No rales or rhonchi. No wheezes.  ABDOMEN: Soft, flat, nontender, nondistended. Has good bowel sounds. No hepatosplenomegaly appreciated.  EXTREMITIES: No evidence of any cyanosis, clubbing, but bilateral leg edema 1+.  +2 pedal and radial pulses bilaterally.  NEUROLOGIC: The patient is alert, awake, and oriented x3 with no focal motor or sensory deficits appreciated bilaterally.  SKIN: Moist and warm with no rashes appreciated.  Psych: Not anxious, depressed LN: No inguinal LN enlargement    Antibiotics   Anti-infectives    Start     Dose/Rate Route Frequency Ordered Stop   04/11/17 1800  ceFAZolin (ANCEF) IVPB 1 g/50 mL premix     1 g 100 mL/hr over 30 Minutes Intravenous  Once 04/11/17 1418 04/11/17 1905   04/10/17 1800  ciprofloxacin (CIPRO) tablet 500 mg  Status:  Discontinued     500 mg Oral Every 24 hours 04/09/17 2351 04/14/17 1536   04/09/17 2215  metroNIDAZOLE (FLAGYL) tablet 500 mg  Status:  Discontinued     500 mg Oral Every 8 hours 04/09/17 2201 04/14/17 1536   04/09/17 1730  ciprofloxacin (CIPRO) IVPB 400 mg     400 mg 200 mL/hr over 60 Minutes Intravenous  Once 04/09/17 1727 04/09/17 2006   04/09/17 1730  metroNIDAZOLE (FLAGYL) IVPB 500 mg      500 mg 100 mL/hr over 60 Minutes Intravenous  Once 04/09/17 1727 04/09/17 1852      Medications   Scheduled Meds: . aspirin  81 mg Oral Daily  . atenolol  50 mg Oral Daily  . atorvastatin  10 mg Oral q1800  . clonazePAM  1 mg Oral QHS  . insulin aspart  0-5 Units Subcutaneous QHS  . insulin aspart  0-9 Units Subcutaneous TID WC  . tamsulosin  0.4 mg Oral Daily   Continuous Infusions:  PRN Meds:.acetaminophen **OR** acetaminophen, hydrocortisone cream, LORazepam, ondansetron **OR** ondansetron (ZOFRAN) IV, oxyCODONE, promethazine   Data Review:   Micro Results Recent Results (from the past 240 hour(s))  Urine Culture     Status: Abnormal   Collection Time: 04/09/17  7:24 PM  Result Value Ref Range Status   Specimen Description URINE, RANDOM  Final   Special Requests NONE  Final   Culture (A)  Final    <10,000 COLONIES/mL INSIGNIFICANT GROWTH Performed at [facility], [address], [place], [place] [address]    Report Status 04/11/2017 FINAL  Final  Urine Culture     Status: None   Collection Time: 04/11/17  6:54 PM  Result Value Ref Range Status   Specimen Description URINE, SUPRAPUBIC CYSTOSCOPY  Final   Special Requests NONE  Final   Culture   Final    NO GROWTH Performed at [facility], [place] [address], [place], [place] [address]    Report Status 04/13/2017 FINAL  Final    Radiology Reports Ct Abdomen Pelvis Wo Contrast  Result Date: 04/09/2017 CLINICAL DATA:  Left lower quadrant pain.  Acute renal failure. EXAM: CT ABDOMEN AND PELVIS WITHOUT CONTRAST TECHNIQUE: Multidetector CT imaging of the abdomen and pelvis was performed following the standard protocol without IV contrast. COMPARISON:  CT 03/02/2017 FINDINGS: Lower chest: Linear atelectasis in the left lower lobe. No pleural effusion.  Coronary artery calcifications are seen. Hepatobiliary: Decreased hepatic density consistent with steatosis. Small subcentimeter hypodensity in the right lobe is  unchanged but incompletely characterized. Gallbladder is surgically absent, no biliary dilatation. Pancreas: Parenchymal atrophy. No ductal dilatation or inflammation. Spleen: Normal in size without focal abnormality. Adrenals/Urinary Tract: Normal adrenal glands. Enlargement and cystic replacement of the right kidney with innumerable cysts of varying sizes. Appearing density and some with thin calcifications. Overall appearance is unchanged from prior exam. The right ureter is nondilated. There is prominence of the left renal pelvis in ureter with left perinephric edema. No urolithiasis. Exophytic cyst from the lower left kidney is unchanged. Urinary bladder is completely decompressed. Stomach/Bowel: Stomach is physiologically distended without wall thickening. No small bowel dilatation, inflammation or obstruction. Normal appendix. Multifocal colonic diverticulosis throughout the entire colon. Possible early pericolonic inflammation and wall thickening at the distal descending colon. No perforation. No abscess. Vascular/Lymphatic: Aortic and branch atherosclerosis. Aneurysmal dilatation of the infrarenal aorta maximal dimension 3.9 cm, unchanged from prior exam. No periaortic soft tissue stranding to suggest rupture. Enlarged right external iliac node measures 2.2 cm series 2, image 83, unchanged from prior exam. Additional prominent and enlarged pelvic and retroperitoneal nodes are also stable. No progressive lymphadenopathy. Reproductive: Normal sized prostate gland. Other: Fat within both inguinal canals. Small fat containing supraumbilical ventral abdominal wall hernia contains only fat. No free air, free fluid, or intra-abdominal fluid collection. Musculoskeletal: Again seen degenerative change in the spine. Probable hemangioma within L1 vertebral body. Chronic deformity of the right anterior iliac crest. IMPRESSION: 1. Multifocal colonic diverticulosis. Possible early uncomplicated acute diverticulitis in  the distal descending colon. 2. Prominence of the left ureter and renal collecting system with increased perinephric edema, suspicious for urinary tract infection. No urolithiasis. 3. Polycystic disease of the right kidney, stable in noncontrast CT appearance. 4. Aortic atherosclerosis with infrarenal abdominal aortic aneurysm measuring 3.9 x 3.8 cm. Recommend followup by ultrasound in 2 years. This recommendation follows ACR consensus guidelines: White Paper of the ACR Incidental Findings Committee II on Vascular Findings. J Am Coll Radiol 2013; 10:789-794. 5. Hepatic steatosis. 6. Stable enlarged pelvic and retroperitoneal nodes. Electronically Signed   By: Jeb Levering M.D.   On: 04/09/2017 19:59   US Renal  Result Date: 04/11/2017 CLINICAL DATA:  Acute renal failure EXAM: RENAL / URINARY TRACT ULTRASOUND COMPLETE COMPARISON:  04/09/2017 FINDINGS: Right Kidney: Length: 23.3 cm. Changes consistent with polycystic kidney disease are noted. The largest of these cystic changes measures approximately 6.4 cm. No obstructive changes are noted. These changes are similar to that seen on recent CT. Left Kidney: Length: 14.4 cm. 1.8 cm cyst is noted within the left kidney. No obstructive changes are seen. Bladder: Bladder is decompressed IMPRESSION: Changes of polycystic disease within the right kidney. Single left renal cyst is noted. Electronically Signed   By: Inez Catalina M.D.   On: 04/11/2017 10:55     CBC  Recent Labs Lab 04/12/17 0507 04/13/17 0532 04/15/17 0429  WBC 16.7* 15.5* 14.0*  HGB 9.6* 8.9* 8.4*  HCT 28.1* 26.5* 24.7*  PLT 64* 70* 73*  MCV 100.1* 98.7 100.3*  MCH 34.3* 32.9 34.2*  MCHC 34.2 33.4 34.1  RDW 14.5 14.5 14.2    Chemistries   Recent Labs Lab 04/13/17 0532 04/14/17 0512 04/15/17 0429 04/16/17 0422 04/17/17 0413  NA 134* 133* 135 137 141  K 3.6 3.7 3.5 3.6 3.6  CL 98* 98* 100* 102 105  CO2 25 26 27 27  28  GLUCOSE 122* 139* 115* 112* 134*  BUN 46* 54* 56* 56*  54*  CREATININE 5.14* 5.60* 5.59* 4.75* 4.18*  CALCIUM 7.1* 7.0* 7.1* 7.3* 7.5*  MG  --   --   --  1.7  --    ------------------------------------------------------------------------------------------------------------------ estimated creatinine clearance is 17.3 mL/min (A) (by C-G formula based on SCr of 4.18 mg/dL (H)). ------------------------------------------------------------------------------------------------------------------ No results for input(s): HGBA1C in the last 72 hours. ------------------------------------------------------------------------------------------------------------------ No results for input(s): CHOL, HDL, LDLCALC, TRIG, CHOLHDL, LDLDIRECT in the last 72 hours. ------------------------------------------------------------------------------------------------------------------ No results for input(s): TSH, T4TOTAL, T3FREE, THYROIDAB in the last 72 hours.  Invalid input(s): FREET3 ------------------------------------------------------------------------------------------------------------------ No results for input(s): VITAMINB12, FOLATE, FERRITIN, TIBC, IRON, RETICCTPCT in the last 72 hours.  Coagulation profile No results for input(s): INR, PROTIME in the last 168 hours.  No results for input(s): DDIMER in the last 72 hours.  Cardiac Enzymes No results for input(s): CKMB, TROPONINI, MYOGLOBIN in the last 168 hours.  Invalid input(s): CK ------------------------------------------------------------------------------------------------------------------ Invalid input(s): [place]  Patient is a 83 year old with acute on chronic renal failure 1. Acute on chronic renal failure (HCC) Stage III Felt to be due to due to ATN, obstructive uropathy and prerenal azotemia Status post stent placement and Foley catheter placement status post hemodialysis Hemodialysis treatment two days.   Renal function is improving, no need for dialysis, removed  temporary dialysis catheter. Can d/c Foley cath per Dr. Candiss Norse.  2. possible  Diverticulitis - seen on CT scan,  discontinued Cipro and Flagyl.  3.  Chronic lymphocytic leukemia (HCC) -WBC count going up and down check CBC in the morning  4.   Diabetes mellitus ([place]) - sliding scale insulin corresponding glucose checks. Lantus on hold.  5. Essential hypertension : mycardi and  HCTZ on hold due to acute renal failure, continue atenolol.  6.low plt suspect related to CLL, stable.  All the records are reviewed and case discussed with Dr. Candiss Norse. Management plans discussed with the patient and his wife.     Code Status Orders        Start     Ordered   04/09/17 2202  Full code  Continuous     04/09/17 2201    Code Status History    Date Active Date Inactive Code Status Order ID Comments User Context   This patient has a current code status but no historical code status.    Advance Directive Documentation     Most Recent Value  Type of Advance Directive  Healthcare Power of Attorney, Living will  Pre-existing out of facility DNR order (yellow form or pink MOST form)  -  "MOST" Form in Place?  -           Consults  Urology and nephrology  DVT Prophylaxis  SCDs due to thrombocytopenia  Lab Results  Component Value Date   PLT 73 (L) 04/15/2017     Time Spent in minutes  27 min  Greater than 50% of time spent in care coordination and counseling patient regarding the condition and plan of care.   Demetrios Loll M.D on 04/17/2017 at 12:43 PM  Between 7am to 6pm - Pager - [phone]  After 6pm go to [email or web address] - password [email or web address] [place] [facility]   Office  [phone]

## 2017-04-18 LAB — PROTEIN / CREATININE RATIO, URINE
Creatinine, Urine: 91 mg/dL
PROTEIN CREATININE RATIO: 1.14 mg/mg{creat} — AB (ref 0.00–0.15)
TOTAL PROTEIN, URINE: 104 mg/dL

## 2017-04-18 LAB — BASIC METABOLIC PANEL
Anion gap: 7 (ref 5–15)
BUN: 47 mg/dL — AB (ref 6–20)
CO2: 28 mmol/L (ref 22–32)
Calcium: 7.6 mg/dL — ABNORMAL LOW (ref 8.9–10.3)
Chloride: 108 mmol/L (ref 101–111)
Creatinine, Ser: 3.55 mg/dL — ABNORMAL HIGH (ref 0.61–1.24)
GFR calc Af Amer: 17 mL/min — ABNORMAL LOW (ref >60)
GFR, EST NON AFRICAN AMERICAN: 15 mL/min — AB (ref >60)
GLUCOSE: 131 mg/dL — AB (ref 65–99)
POTASSIUM: 3.7 mmol/L (ref 3.5–5.1)
Sodium: 143 mmol/L (ref 135–145)

## 2017-04-18 LAB — URINALYSIS, ROUTINE W REFLEX MICROSCOPIC
Bilirubin Urine: NEGATIVE
Glucose, UA: NEGATIVE mg/dL
KETONES UR: NEGATIVE mg/dL
Nitrite: NEGATIVE
PROTEIN: 100 mg/dL — AB
Specific Gravity, Urine: 1.008 (ref 1.005–1.030)
pH: 6 (ref 5.0–8.0)

## 2017-04-18 LAB — GLUCOSE, CAPILLARY
Glucose-Capillary: 128 mg/dL — ABNORMAL HIGH (ref 65–99)
Glucose-Capillary: 146 mg/dL — ABNORMAL HIGH (ref 65–99)

## 2017-04-18 MED ORDER — CIPROFLOXACIN IN D5W 400 MG/200ML IV SOLN
400.0000 mg | Freq: Every day | INTRAVENOUS | Status: DC
  Filled 2017-04-18: qty 200

## 2017-04-18 MED ORDER — CIPROFLOXACIN HCL 500 MG PO TABS: 500.0000 mg | ORAL_TABLET | Freq: Every day | ORAL | 0 refills | Status: DC

## 2017-04-18 MED ORDER — CIPROFLOXACIN HCL 500 MG PO TABS: 500.0000 mg | ORAL_TABLET | Freq: Every day | ORAL | Status: DC

## 2017-04-18 NOTE — Care Management (Signed)
Patient admitted with Acute on chronic renal failure .  Patient lives at home with wife.  PCP [place].  Pharmacy [facility]. Patient received acute HD while inpatient.  Temp cath has been removed.  PT has assessed patient and recommends home health PT.  Patient ambulates with cane at baseline.Home health orders have been written for PT and RN.  Patient agreeable to home health services.   Patient provided home health agency.  Patient states that he does not have a preference of agency.  Referral made to [place] with [facility].  RNCM signing off

## 2017-04-18 NOTE — Discharge Summary (Signed)
[place] [facility] [place] NAME: Patrick Payne    MR#:  [id number]  DATE OF BIRTH:  July 20, 1934  DATE OF ADMISSION:  04/09/2017   ADMITTING PHYSICIAN: Lance Coon, MD  DATE OF DISCHARGE: 04/18/2017 PRIMARY CARE PHYSICIAN: Idelle Crouch, MD   ADMISSION DIAGNOSIS:  Diverticulitis [K57.92] Acute renal failure, unspecified acute renal failure type ([place]) [N17.9] DISCHARGE DIAGNOSIS:  Principal Problem:   Acute on chronic renal failure (HCC) Active Problems:   Chronic lymphocytic leukemia (HCC)   Diabetes mellitus (HCC)   BP (high blood pressure)   HLD (hyperlipidemia)   Flank pain   Diverticulitis   Polycystic kidney disease  SECONDARY DIAGNOSIS:   Past Medical History:  Diagnosis Date  . CKD (chronic kidney disease), stage III   . Diabetes mellitus without complication ([place])   . HLD (hyperlipidemia)   . HTN (hypertension)   . PKD (polycystic kidney disease)    HOSPITAL COURSE:   Patient is a 83 year old with acute on chronic renal failure 1. Acute on chronic renal failure (HCC) Stage III Felt to be due to due to ATN, obstructive uropathy and prerenal azotemia Status post stent placement and Foley catheter placement status post hemodialysis Hemodialysis treatment two days.   Renal function is improving, no need for dialysis, removed temporary dialysis catheter. Can d/c home today but need abx prophylaxis for a few days due to UA today per Dr. Candiss Norse. Follow-up with urologist as outpatient.  2. possibleDiverticulitis - seen on CT scan,  discontinued Cipro and Flagyl.  3. Chronic lymphocytic leukemia (HCC) -WBC count going up and down.  4. Diabetes mellitus ([place]) - sliding scale insulin corresponding glucose checks. Lantus on hold.  5. Essential hypertension : mycardi and  HCTZ on hold due to acute renal failure, continue atenolol.  6.low plt suspect related to CLL, stable. Discussed with Dr. Candiss Norse. DISCHARGE  CONDITIONS:  Stable, discharge to home with home health and PT today. CONSULTS OBTAINED:  Treatment Team:  Lavonia Dana, MD Schnier, Dolores Lory, MD DRUG ALLERGIES:   Allergies  Allergen Reactions  . Celecoxib Other (See Comments)    Increased Blood Pressure  . Chlorpromazine Nausea Only  . Meloxicam Other (See Comments)    Hypertension  . Prednisone Other (See Comments)    Diabetic  . Amoxicillin-Pot Clavulanate Rash  . Penicillin V Potassium Rash   DISCHARGE MEDICATIONS:   Allergies as of 04/18/2017      Reactions   Celecoxib Other (See Comments)   Increased Blood Pressure   Chlorpromazine Nausea Only   Meloxicam Other (See Comments)   Hypertension   Prednisone Other (See Comments)   Diabetic   Amoxicillin-pot Clavulanate Rash   Penicillin V Potassium Rash      Medication List    STOP taking these medications   aspirin 81 MG tablet   LANTUS SOLOSTAR 100 UNIT/ML Solostar Pen Generic drug:  Insulin Glargine   pioglitazone 45 MG tablet Commonly known as:  ACTOS   telmisartan-hydrochlorothiazide 80-25 MG tablet Commonly known as:  MICARDIS HCT     TAKE these medications   allopurinol 100 MG tablet Commonly known as:  ZYLOPRIM Take 100 mg by mouth daily.   atenolol 50 MG tablet Commonly known as:  TENORMIN Take 50 mg by mouth daily.   atorvastatin 10 MG tablet Commonly known as:  LIPITOR Take 10 mg by mouth at bedtime.   calcipotriene-betamethasone ointment Commonly known as:  TACLONEX Apply 1 application topically as needed (psoriasis).  ciprofloxacin 500 MG tablet Commonly known as:  CIPRO Take 1 tablet (500 mg total) by mouth daily.   clonazePAM 1 MG tablet Commonly known as:  KLONOPIN Take 1 mg by mouth at bedtime.   cyanocobalamin 500 MCG tablet Take 500 mcg by mouth at bedtime.   EYLEA 2 MG/0.05ML Soln Generic drug:  Aflibercept Inject 1 Dose into the eye as directed. One injection into left eye every 4-6 weeks   fexofenadine 180  MG tablet Commonly known as:  ALLEGRA Take 180 mg by mouth daily as needed for allergies.   finasteride 5 MG tablet Commonly known as:  PROSCAR Take 5 mg by mouth daily.   LORazepam 1 MG tablet Commonly known as:  ATIVAN Take 1 mg by mouth every 8 (eight) hours as needed for anxiety.   omega-3 acid ethyl esters 1 g capsule Commonly known as:  LOVAZA Take 2 g by mouth at bedtime.   PRESERVISION AREDS 2 PO Take 1 capsule by mouth 2 (two) times daily.   tamsulosin 0.4 MG Caps capsule Commonly known as:  FLOMAX Take 0.4 mg by mouth daily.   triamcinolone 55 MCG/ACT Aero nasal inhaler Commonly known as:  NASACORT Place 2 sprays into the nose 2 (two) times daily as needed (allergies).   Vitamin D-3 5000 units Tabs Take 5,000 Units by mouth at bedtime.        DISCHARGE INSTRUCTIONS:  See AVS. If you experience worsening of your admission symptoms, develop shortness of breath, life threatening emergency, suicidal or homicidal thoughts you must seek medical attention immediately by calling 911 or calling your MD immediately  if symptoms less severe.  You Must read complete instructions/literature along with all the possible adverse reactions/side effects for all the Medicines you take and that have been prescribed to you. Take any new Medicines after you have completely understood and accpet all the possible adverse reactions/side effects.   Please note  You were cared for by a hospitalist during your hospital stay. If you have any questions about your discharge medications or the care you received while you were in the hospital after you are discharged, you can call the unit and asked to speak with the hospitalist on call if the hospitalist that took care of you is not available. Once you are discharged, your primary care physician will handle any further medical issues. Please note that NO REFILLS for any discharge medications will be authorized once you are discharged, as it is  imperative that you return to your primary care physician (or establish a relationship with a primary care physician if you do not have one) for your aftercare needs so that they can reassess your need for medications and monitor your lab values.    On the day of Discharge:  VITAL SIGNS:  Blood pressure (!) 138/55, pulse 67, temperature 98.4 F (36.9 C), temperature source Oral, resp. rate 20, height 5\' 7"  (1.702 m), weight 259 lb 9.6 oz (117.8 kg), SpO2 94 %. PHYSICAL EXAMINATION:  GENERAL:  83 y.o.-year-old patient lying in the bed with no acute distress. Obese. EYES: Pupils equal, round, reactive to light and accommodation. No scleral icterus. Extraocular muscles intact.  HEENT: Head atraumatic, normocephalic. Oropharynx and nasopharynx clear.  NECK:  Supple, no jugular venous distention. No thyroid enlargement, no tenderness.  LUNGS: Normal breath sounds bilaterally, no wheezing, rales,rhonchi or crepitation. No use of accessory muscles of respiration.  CARDIOVASCULAR: S1, S2 normal. No murmurs, rubs, or gallops.  ABDOMEN: Soft, non-tender, non-distended. Bowel sounds  present. No organomegaly or mass.  EXTREMITIES: Bilateral leg edema  1+, no cyanosis, or clubbing.  NEUROLOGIC: Cranial nerves II through XII are intact. Muscle strength 5/5 in all extremities. Sensation intact. Gait not checked.  PSYCHIATRIC: The patient is alert and oriented x 3.  SKIN: No obvious rash, lesion, or ulcer.  DATA REVIEW:   CBC  Recent Labs Lab 04/15/17 0429  WBC 14.0*  HGB 8.4*  HCT 24.7*  PLT 73*    Chemistries   Recent Labs Lab 04/16/17 0422  04/18/17 0455  NA 137  < > 143  K 3.6  < > 3.7  CL 102  < > 108  CO2 27  < > 28  GLUCOSE 112*  < > 131*  BUN 56*  < > 47*  CREATININE 4.75*  < > 3.55*  CALCIUM 7.3*  < > 7.6*  MG 1.7  --   --   < > = values in this interval not displayed.   Microbiology Results  Results for orders placed or performed during the hospital encounter of 04/09/17   Urine Culture     Status: Abnormal   Collection Time: 04/09/17  7:24 PM  Result Value Ref Range Status   Specimen Description URINE, RANDOM  Final   Special Requests NONE  Final   Culture (A)  Final    <10,000 COLONIES/mL INSIGNIFICANT GROWTH Performed at [facility], [place] [address], [place], [place] [address]    Report Status 04/11/2017 FINAL  Final  Urine Culture     Status: None   Collection Time: 04/11/17  6:54 PM  Result Value Ref Range Status   Specimen Description URINE, SUPRAPUBIC CYSTOSCOPY  Final   Special Requests NONE  Final   Culture   Final    NO GROWTH Performed at [facility], [place] [address] [place], [place] [address]    Report Status 04/13/2017 FINAL  Final    RADIOLOGY:  No results found.   Management plans discussed with the patient, his daughter and they are in agreement.  CODE STATUS: Full Code   TOTAL TIME TAKING CARE OF THIS PATIENT: 38 minutes.    Demetrios Loll M.D on 04/18/2017 at 2:01 PM  Between 7am to 6pm - Pager - [phone]  After 6pm go to [email or web address] - Proofreader  [facility]  Office  [phone]  CC: Primary care physician; Idelle Crouch, MD   Note: This dictation was prepared with Dragon dictation along with smaller phrase technology. Any transcriptional errors that result from this process are unintentional.

## 2017-04-18 NOTE — Consult Note (Signed)
Pharmacy Antibiotic Note  Patrick Payne is a 83 y.o. male admitted on 04/09/2017 with UTI.  Pharmacy has been consulted for ciprofloxacin dosing.  Plan: cipro 500mg  po once daily  Height: 5\' 7"  (170.2 cm) Weight: 259 lb 9.6 oz (117.8 kg) IBW/kg (Calculated) : 66.1  Temp (24hrs), Avg:98.1 F (36.7 C), Min:97.6 F (36.4 C), Max:98.4 F (36.9 C)   Recent Labs Lab 04/12/17 0507 04/13/17 0532 04/14/17 0512 04/15/17 0429 04/16/17 0422 04/17/17 0413 04/18/17 0455  WBC 16.7* 15.5*  --  14.0*  --   --   --   CREATININE 6.27* 5.14* 5.60* 5.59* 4.75* 4.18* 3.55*    Estimated Creatinine Clearance: 20.4 mL/min (A) (by C-G formula based on SCr of 3.55 mg/dL (H)).    Allergies  Allergen Reactions  . Celecoxib Other (See Comments)    Increased Blood Pressure  . Chlorpromazine Nausea Only  . Meloxicam Other (See Comments)    Hypertension  . Prednisone Other (See Comments)    Diabetic  . Amoxicillin-Pot Clavulanate Rash  . Penicillin V Potassium Rash    Antimicrobials this admission: cipro 7/2 >> 7/5, 7/10>>   Dose adjustments this admission:   Microbiology results: 7/3 UCx: NG 7/1 UCx:  insig growth  Thank you for allowing pharmacy to be a part of this patient's care.  Ramond Dial, Pharm.D, BCPS Clinical Pharmacist  04/18/2017 11:53 AM

## 2017-04-18 NOTE — Progress Notes (Signed)
[facility]  ROUNDING NOTE   Subjective:   Patient's wife daughter is at bedside. He states that overall he feels better.  He is able to eat without nausea and vomiting  Able to eat better His serum creatinine has further improved to 3.55 today.  Able to void after foley removal although concerned that urine is dark yellow/orange  Objective:  Vital signs in last 24 hours:  Temp:  [97.6 F (36.4 C)-98.4 F (36.9 C)] 98.4 F (36.9 C) (07/10 0519) Pulse Rate:  [59-64] 62 (07/10 0519) Resp:  [18-20] 20 (07/10 0519) BP: (138-172)/(55-59) 138/55 (07/10 0519) SpO2:  [92 %-95 %] 92 % (07/10 0519)  Weight change:  Filed Weights   04/12/17 1010 04/12/17 1308 04/13/17 1214  Weight: 119 kg (262 lb 5.6 oz) 117.1 kg (258 lb 2.5 oz) 117.8 kg (259 lb 9.6 oz)    Intake/Output: I/O last 3 completed shifts: In: 14 [P.O.:960] Out: 3100 [Urine:3100]   Intake/Output this shift:  Total I/O In: 240 [P.O.:240] Out: 300 [Urine:300]  Physical Exam: General: NAD, sitting up  Head: Normocephalic, atraumatic. Moist oral mucosal membranes  Eyes: Anicteric,   Neck: Supple,   Lungs:  Clear to auscultation  Heart: Regular rate and rhythm  Abdomen:  Soft, nontender, +obese  Extremities: +peripheral edema.  Neurologic: Nonfocal, moving all four extremities  Skin: No lesions          Basic Metabolic Panel:  Recent Labs Lab 04/12/17 0507  04/14/17 0512 04/15/17 0429 04/16/17 0422 04/17/17 0413 04/18/17 0455  NA 133*  < > 133* 135 137 141 143  K 3.7  < > 3.7 3.5 3.6 3.6 3.7  CL 100*  < > 98* 100* 102 105 108  CO2 26  < > 26 27 27 28 28   GLUCOSE 156*  < > 139* 115* 112* 134* 131*  BUN 56*  < > 54* 56* 56* 54* 47*  CREATININE 6.27*  < > 5.60* 5.59* 4.75* 4.18* 3.55*  CALCIUM 7.1*  < > 7.0* 7.1* 7.3* 7.5* 7.6*  MG  --   --   --   --  1.7  --   --   PHOS 4.7*  --  4.9*  --   --  5.1*  --   < > = values in this interval not displayed.  Liver Function Tests:  Recent  Labs Lab 04/12/17 0507 04/14/17 0512 04/17/17 0413  ALBUMIN 2.6* 2.4* 2.5*   No results for input(s): LIPASE, AMYLASE in the last 168 hours. No results for input(s): AMMONIA in the last 168 hours.  CBC:  Recent Labs Lab 04/12/17 0507 04/13/17 0532 04/15/17 0429  WBC 16.7* 15.5* 14.0*  HGB 9.6* 8.9* 8.4*  HCT 28.1* 26.5* 24.7*  MCV 100.1* 98.7 100.3*  PLT 64* 70* 73*    Cardiac Enzymes: No results for input(s): CKTOTAL, CKMB, CKMBINDEX, TROPONINI in the last 168 hours.  BNP: Invalid input(s): POCBNP  CBG:  Recent Labs Lab 04/17/17 0739 04/17/17 1142 04/17/17 1641 04/17/17 2203 04/18/17 0730  GLUCAP 116* 157* 147* 160* 128*    Microbiology: Results for orders placed or performed during the hospital encounter of 04/09/17  Urine Culture     Status: Abnormal   Collection Time: 04/09/17  7:24 PM  Result Value Ref Range Status   Specimen Description URINE, RANDOM  Final   Special Requests NONE  Final   Culture (A)  Final    <10,000 COLONIES/mL INSIGNIFICANT GROWTH Performed at [facility], [place]  [address], [place], [place] [address]    Report Status 04/11/2017 FINAL  Final  Urine Culture     Status: None   Collection Time: 04/11/17  6:54 PM  Result Value Ref Range Status   Specimen Description URINE, SUPRAPUBIC CYSTOSCOPY  Final   Special Requests NONE  Final   Culture   Final    NO GROWTH Performed at [facility], [place] [address], [place], [place] [address]    Report Status 04/13/2017 FINAL  Final    Coagulation Studies: No results for input(s): LABPROT, INR in the last 72 hours.  Urinalysis: No results for input(s): COLORURINE, LABSPEC, PHURINE, GLUCOSEU, HGBUR, BILIRUBINUR, KETONESUR, PROTEINUR, UROBILINOGEN, NITRITE, LEUKOCYTESUR in the last 72 hours.  Invalid input(s): APPERANCEUR    Imaging: No results found.   Medications:    . aspirin  81 mg Oral Daily  . atenolol  50 mg Oral Daily  . atorvastatin  10 mg Oral q1800  .  clonazePAM  1 mg Oral QHS  . insulin aspart  0-5 Units Subcutaneous QHS  . insulin aspart  0-9 Units Subcutaneous TID WC  . tamsulosin  0.4 mg Oral Daily   acetaminophen **OR** acetaminophen, hydrocortisone cream, LORazepam, ondansetron **OR** ondansetron (ZOFRAN) IV, oxyCODONE, promethazine  Assessment/ Plan:  [name] is a 83 y.o. white male with hypertension, Right polycystic kidney disease, hyperlipidemia, diabetes mellitus type II insulin dependent, gout, BPH, allergic rhinitis, CLL, AAA, obstructive sleep apnea. Admitted on 04/09/2017 with Diverticulitis, obstructive uropathy, hematuria and acute renal failure. Cystoscopy and ureteral stent placement by Dr. Junious Silk on 7/3.   1. Acute renal failure with hematuria on chronic kidney disease stage III with proteinuria: with concern of ATN, obstructive uropathy and prerenal azotemia.  Urine output nonoliguric Baseline creatinine of 1.5, GFR of 45 from 12/12/16. 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  - Hemodialysis treatment x 2 on 7/3 and 7/4.  - No acute indication for dialysis today. Dialysis catheter has been removed - renally dose all medications.  - Able to void without foley - f/u in our office for labs on Thursday and office appt next week   2. Right polycystic kidney, Mild left hydronephrosis with AKI Left ureteral stent placed on 7/3 - Urology follow up  3. Diabetes mellitus type II: with chronic kidney disease: insulin dependent. Hemoglobin A1c 7% on 3/5. - continue glucose control.    LOS: 9 Patrick Payne 7/10/20189:55 AM

## 2017-04-18 NOTE — Care Management Important Message (Signed)
Important Message  Patient Details  Name: Patrick Payne MRN: [id number] Date of Birth: 1933-12-27   Medicare Important Message Given:  Yes    Beverly Sessions, RN 04/18/2017, 1:52 PM

## 2017-04-18 NOTE — Progress Notes (Signed)
Physical Therapy Treatment Patient Details Name: Patrick Payne MRN: [id number] DOB: 1933-12-03 Today's Date: 04/18/2017    History of Present Illness Patrick Payne  is a 83 y.o. male who presents with Left lower quadrant abdominal pain. He was diagnosed with acute on chronic renal failure and was started on dialysis. Patient also is s/p recent left ureteral stent with foly placement;     PT Comments    Pt agreeable to PT; no voiced complaints.  Pt demonstrating Mod I with bed mobility and sit to/from stand transfers. Supervision for ambulation with straight cane. Pt reports gait speed is below baseline, but reports feeling steady and presents so as well. Pt negotiates up/down 7 steps with cane and 1 rail; educated on use of 2 hands on 1 rail and side step pattern if needed due to weakness or fatigue; pt and spouse understand. Pt receives up in chair for lunch. Pt currently has discharge orders to home with home health PT to continue strengthening and endurance to improve functional mobility to baseline.   Follow Up Recommendations  Home health PT;Supervision - Intermittent     Equipment Recommendations  None recommended by PT    Recommendations for Other Services       Precautions / Restrictions Precautions Precautions: Fall Restrictions Weight Bearing Restrictions: No    Mobility  Bed Mobility Overal bed mobility: Modified Independent       Supine to sit: Modified independent (Device/Increase time)     General bed mobility comments: Use of rails  Transfers Overall transfer level: Modified independent Equipment used: Straight cane Transfers: Sit to/from Stand Sit to Stand: Modified independent (Device/Increase time)         General transfer comment: Good use of hands, steady  Ambulation/Gait Ambulation/Gait assistance: Supervision Ambulation Distance (Feet): 110 Feet Assistive device: Straight cane Gait Pattern/deviations: Step-through  pattern;Decreased step length - right;Decreased step length - left;Decreased dorsiflexion - right;Decreased dorsiflexion - left Gait velocity: decreased Gait velocity interpretation: Below normal speed for age/gender General Gait Details: No LOB, pt notes mild decrease in speed from baseline. Functional for household distances    Stairs Stairs: Yes   Stair Management: One rail Right;One rail Left;Step to pattern;With cane Number of Stairs: 7 General stair comments: Steady, good eccentric/concentric control.   Wheelchair Mobility    Modified Rankin (Stroke Patients Only)       Balance Overall balance assessment: Modified Independent                                          Cognition Arousal/Alertness: Awake/alert Behavior During Therapy: WFL for tasks assessed/performed Overall Cognitive Status: Within Functional Limits for tasks assessed                                        Exercises      General Comments        Pertinent Vitals/Pain Pain Assessment: No/denies pain    Home Living                      Prior Function            PT Goals (current goals can now be found in the care plan section) Progress towards PT goals: Progressing toward goals    Frequency  Min 2X/week      PT Plan Current plan remains appropriate    Co-evaluation              AM-PAC PT "6 Clicks" Daily Activity  Outcome Measure  Difficulty turning over in bed (including adjusting bedclothes, sheets and blankets)?: None Difficulty moving from lying on back to sitting on the side of the bed? : None Difficulty sitting down on and standing up from a chair with arms (e.g., wheelchair, bedside commode, etc,.)?: None Help needed moving to and from a bed to chair (including a wheelchair)?: None Help needed walking in hospital room?: A Little Help needed climbing 3-5 steps with a railing? : A Little 6 Click Score: 22    End of  Session Equipment Utilized During Treatment: Gait belt Activity Tolerance: Patient tolerated treatment well;No increased pain Patient left: in chair;with call bell/phone within reach;with family/visitor present   PT Visit Diagnosis: Unsteadiness on feet (R26.81);Muscle weakness (generalized) (M62.81)     Time: 4010-2725 PT Time Calculation (min) (ACUTE ONLY): 24 min  Charges:  $Gait Training: 23-37 mins                    G Codes:  Functional Assessment Tool Used: AM-PAC 6 Clicks Basic Mobility;Clinical judgement     Larae Grooms, PTA 04/18/2017, 12:07 PM

## 2017-04-18 NOTE — Discharge Instructions (Signed)
Ureteral Stent Implantation, Care After Refer to this sheet in the next few weeks. These instructions provide you with information about caring for yourself after your procedure. Your health care provider may also give you more specific instructions. Your treatment has been planned according to current medical practices, but problems sometimes occur. Call your health care provider if you have any problems or questions after your procedure.  Removal of the stent: Stent is temporary and must be removed. Be sure to keep follow-up appointments to plan stent removal.  What can I expect after the procedure? After the procedure, it is common to have:  Nausea.  Mild pain when you urinate. You may feel this pain in your lower back or lower abdomen. Pain should stop within a few minutes after you urinate. This may last for up to 1 week.  A small amount of blood in your urine for several days.  Follow these instructions at home:  Medicines  Take over-the-counter and prescription medicines only as told by your health care provider.  If you were prescribed an antibiotic medicine, take it as told by your health care provider. Do not stop taking the antibiotic even if you start to feel better.  Do not drive for 24 hours if you received a sedative.  Do not drive or operate heavy machinery while taking prescription pain medicines. Activity  Return to your normal activities as told by your health care provider. Ask your health care provider what activities are safe for you.  Do not lift anything that is heavier than 10 lb (4.5 kg). Follow this limit for 1 week after your procedure, or for as long as told by your health care provider. General instructions  Watch for any blood in your urine. Call your health care provider if the amount of blood in your urine increases.  If you have a catheter: ? Follow instructions from your health care provider about taking care of your catheter and collection  bag. ? Do not take baths, swim, or use a hot tub until your health care provider approves.  Drink enough fluid to keep your urine clear or pale yellow.  Keep all follow-up visits as told by your health care provider. This is important. Contact a health care provider if:  You have pain that gets worse or does not get better with medicine, especially pain when you urinate.  You have difficulty urinating.  You feel nauseous or you vomit repeatedly during a period of more than 2 days after the procedure. Get help right away if:  Your urine is dark red or has blood clots in it.  You are leaking urine (have incontinence).  The end of the stent comes out of your urethra.  You cannot urinate.  You have sudden, sharp, or severe pain in your abdomen or lower back.  You have a fever. This information is not intended to replace advice given to you by your health care provider. Make sure you discuss any questions you have with your health care provider. Document Released: 05/29/2013 Document Revised: 03/03/2016 Document Reviewed: 04/10/2015 Elsevier Interactive Patient Education  2018 [place] healthy and ADA diet HHPT

## 2017-04-18 NOTE — Progress Notes (Signed)
Discharge paperwork reviewed with pt. Prescription given to pt. Questions answered to pts satisfaction. IV removed.

## 2017-05-03 ENCOUNTER — Encounter: Payer: Self-pay | Admitting: Urology

## 2017-05-03 ENCOUNTER — Ambulatory Visit (INDEPENDENT_AMBULATORY_CARE_PROVIDER_SITE_OTHER): Payer: Medicare Other | Admitting: Urology

## 2017-05-03 VITALS — BP 125/67 | HR 70 | Ht 67.0 in | Wt 223.0 lb

## 2017-05-03 DIAGNOSIS — N1330 Unspecified hydronephrosis: Secondary | ICD-10-CM

## 2017-05-03 NOTE — Progress Notes (Addendum)
05/03/2017 2:38 PM   Patrick Payne 1934-03-20 [id number]  Referring provider: Idelle Crouch, MD [facility] [place],  [address]  Chief Complaint  Patient presents with  . New Patient (Initial Visit)    HPI: The patient is a 83 year old gentleman with a right polycystic kidney, functionally solitary left kidney, and history of acute on chronic renal failure who recently underwent left ureteral stent placed on 04/11/2017 for acute on chronic renal failure and new left ureteral dilation. He presents today for follow-up.  After stent placement his creatinine did improve. His baseline is approximately 1.6.  It had risen to 6.5 immediately prior to stent placement. At recent discharge it was 3.55. It had fallen to 2.8 and his nephrologist office today days ago. His renal failure at that point was thought to be multifactorial as renal ATN, obstructive uropathy, and prerenal azotemia. He did necessitate hemodialysis treatment for 2 days. This has since been stopped. He is feeling better now. 6 significant swelling has improved. He has had a 20 pound weight loss since he was admitted due to fluid retention and has since improved.  The patient does have a history of uric acid nephrolithiasis. No stones seen on recent CT.  Per the patient, upon placement of a left ureteral stent a large amount of debris was seen on 04/11/2017. He has never had this problem before. He was experiencing left lower quadrant pain that resolved after stent placement at that time.   PMH: Past Medical History:  Diagnosis Date  . CKD (chronic kidney disease), stage III   . Diabetes mellitus without complication ([place])   . HLD (hyperlipidemia)   . HTN (hypertension)   . PKD (polycystic kidney disease)     Surgical History: Past Surgical History:  Procedure Laterality Date  . CARDIAC CATHETERIZATION  12/1987  . CENTRAL LINE INSERTION N/A 04/11/2017   Procedure: Central Line  Insertion;  Surgeon: Delana Meyer Dolores Lory, MD;  Location: [facility];  Service: Cardiovascular;  Laterality: N/A;  . CHOLECYSTECTOMY    . CYSTOSCOPY WITH STENT PLACEMENT Left 04/11/2017   Procedure: CYSTOSCOPY WITH STENT PLACEMENT;  Surgeon: Festus Aloe, MD;  Location: [facility];  Service: Urology;  Laterality: Left;    Home Medications:  Allergies as of 05/03/2017      Reactions   Celecoxib Other (See Comments)   Increased Blood Pressure   Chlorpromazine Nausea Only   Prednisone Other (See Comments)   Diabetic   Amoxicillin-pot Clavulanate Rash   Penicillin V Potassium Rash      Medication List       Accurate as of 05/03/17  2:38 PM. Always use your most recent med list.          allopurinol 100 MG tablet Commonly known as:  ZYLOPRIM Take 100 mg by mouth daily.   atenolol 50 MG tablet Commonly known as:  TENORMIN Take 50 mg by mouth daily.   atorvastatin 10 MG tablet Commonly known as:  LIPITOR Take 10 mg by mouth at bedtime.   calcipotriene-betamethasone ointment Commonly known as:  TACLONEX Apply 1 application topically as needed (psoriasis).   clonazePAM 1 MG tablet Commonly known as:  KLONOPIN Take 1 mg by mouth at bedtime.   cyanocobalamin 500 MCG tablet Take 500 mcg by mouth at bedtime.   EYLEA 2 MG/0.05ML Soln Generic drug:  Aflibercept Inject 1 Dose into the eye as directed. One injection into left eye every 4-6 weeks   fexofenadine 180  MG tablet Commonly known as:  ALLEGRA Take 180 mg by mouth daily as needed for allergies.   finasteride 5 MG tablet Commonly known as:  PROSCAR Take 5 mg by mouth daily.   LORazepam 1 MG tablet Commonly known as:  ATIVAN Take 1 mg by mouth every 8 (eight) hours as needed for anxiety.   omega-3 acid ethyl esters 1 g capsule Commonly known as:  LOVAZA Take 2 g by mouth at bedtime.   PRESERVISION AREDS 2 PO Take 1 capsule by mouth 2 (two) times daily.   tamsulosin 0.4 MG Caps capsule Commonly  known as:  FLOMAX Take 0.4 mg by mouth daily.   triamcinolone 55 MCG/ACT Aero nasal inhaler Commonly known as:  NASACORT Place 2 sprays into the nose 2 (two) times daily as needed (allergies).   Vitamin D-3 5000 units Tabs Take 5,000 Units by mouth at bedtime.       Allergies:  Allergies  Allergen Reactions  . Celecoxib Other (See Comments)    Increased Blood Pressure  . Chlorpromazine Nausea Only  . Prednisone Other (See Comments)    Diabetic  . Amoxicillin-Pot Clavulanate Rash  . Penicillin V Potassium Rash    Family History: Family History  Problem Relation Age of Onset  . Cancer Mother   . Varicose Veins Mother   . Cancer Father     Social History:  reports that he has quit smoking. He has never used smokeless tobacco. He reports that he does not drink alcohol or use drugs.  ROS: UROLOGY Frequent Urination?: Yes Hard to postpone urination?: No Burning/pain with urination?: No Get up at night to urinate?: Yes Leakage of urine?: No Urine stream starts and stops?: No Trouble starting stream?: No Do you have to strain to urinate?: No Blood in urine?: No Urinary tract infection?: No Sexually transmitted disease?: No Injury to kidneys or bladder?: Yes Painful intercourse?: No Weak stream?: No Erection problems?: No Penile pain?: No  Gastrointestinal Nausea?: No Vomiting?: No Indigestion/heartburn?: No Diarrhea?: No Constipation?: No  Constitutional Fever: No Night sweats?: No Weight loss?: Yes Fatigue?: No  Skin Skin rash/lesions?: No Itching?: No  Eyes Blurred vision?: No Double vision?: No  Ears/Nose/Throat Sore throat?: No Sinus problems?: No  Hematologic/Lymphatic Swollen glands?: No Easy bruising?: Yes  Cardiovascular Leg swelling?: No Chest pain?: No  Respiratory Cough?: No Shortness of breath?: No  Endocrine Excessive thirst?: No  Musculoskeletal Back pain?: No Joint pain?: No  Neurological Headaches?:  No Dizziness?: No  Psychologic Depression?: No Anxiety?: No  Physical Exam: BP 125/67   Pulse 70   Ht 5\' 7"  (1.702 m)   Wt 223 lb (101.2 kg)   BMI 34.93 kg/m   Constitutional:  Alert and oriented, No acute distress. HEENT: [place] AT, moist mucus membranes.  Trachea midline, no masses. Cardiovascular: No clubbing, cyanosis, or edema. Respiratory: Normal respiratory effort, no increased work of breathing. GI: Abdomen is soft, nontender, nondistended, no abdominal masses GU: No CVA tenderness.  Skin: No rashes, bruises or suspicious lesions. Lymph: No cervical or inguinal adenopathy. Neurologic: Grossly intact, no focal deficits, moving all 4 extremities. Psychiatric: Normal mood and affect.  Laboratory Data: Lab Results  Component Value Date   WBC 14.0 (H) 04/15/2017   HGB 8.4 (L) 04/15/2017   HCT 24.7 (L) 04/15/2017   MCV 100.3 (H) 04/15/2017   PLT 73 (L) 04/15/2017    Lab Results  Component Value Date   CREATININE 3.55 (H) 04/18/2017    No results found for: PSA  No results found for: TESTOSTERONE  No results found for: HGBA1C  Urinalysis    Component Value Date/Time   COLORURINE RED (A) 04/18/2017 1007   APPEARANCEUR HAZY (A) 04/18/2017 1007   LABSPEC 1.008 04/18/2017 1007   PHURINE 6.0 04/18/2017 1007   GLUCOSEU NEGATIVE 04/18/2017 1007   HGBUR MODERATE (A) 04/18/2017 1007   BILIRUBINUR NEGATIVE 04/18/2017 1007   KETONESUR NEGATIVE 04/18/2017 1007   PROTEINUR 100 (A) 04/18/2017 1007   NITRITE NEGATIVE 04/18/2017 1007   LEUKOCYTESUR TRACE (A) 04/18/2017 1007    Pertinent Imaging: CT reviewed as above  Assessment & Plan:    1. Left hydronephrosis 2. Right polycystic kidney disease 3. Functionally solitary left kidney I discussed with the patient that the reason for his renal failure is not exactly clear. I do believe that ureteral obstruction did place some compliant given that obstructive material was seen at the time of stent placement with large  return of urine after his stent was placed. I discussed with the patient I do not feel comfortable removing his stent as we are not completely sure if there is residual obstruction. We discussed undergoing cystoscopy, left retrograde pyelogram, possible left ureteroscopy, left ureteral stent exchange versus removal in the operating room. The patient is agreeable to proceeding with this. This will allow [place] to ensure that his kidney drains on retrograde pyelogram before removing his stent. If there is not drainage, we will proceed with ureteroscopy to see if we can find a reason for obstruction. All questions were answered. The risks and benefits were discussed. The patient is agreeable to proceeding.  We'll plan to obtain a renal ultrasound shortly after stent removal as well as a BMP to ensure that he does not again developed acute renal failure as a result of obstruction.  Nickie Retort, MD  [facility] [address], [place], [place] [address] [phone]

## 2017-05-04 ENCOUNTER — Telehealth: Payer: Self-pay | Admitting: Radiology

## 2017-05-04 ENCOUNTER — Other Ambulatory Visit: Payer: Self-pay | Admitting: Radiology

## 2017-05-04 DIAGNOSIS — N1330 Unspecified hydronephrosis: Secondary | ICD-10-CM

## 2017-05-04 NOTE — Telephone Encounter (Signed)
Pt scheduled for cystoscopy, left retrograde pyelogram, possible ureteroscopy & left stent exchange vs. Removal with Dr Pilar Jarvis on 05/12/17. Pt aware of surgery date, pre-admit testing appt & to call day prior to surgery for arrival time to SDS. Questions answered. Pt voices understanding.

## 2017-05-05 LAB — URINE CULTURE: Organism ID, Bacteria: NO GROWTH

## 2017-05-08 ENCOUNTER — Ambulatory Visit
Admission: RE | Admit: 2017-05-08 | Discharge: 2017-05-08 | Disposition: A | Discharge status: Home / Self Care | Payer: Medicare Other | Source: Ambulatory Visit | Attending: Urology | Admitting: Urology

## 2017-05-08 ENCOUNTER — Other Ambulatory Visit: Payer: Self-pay

## 2017-05-08 DIAGNOSIS — I10 Essential (primary) hypertension: Secondary | ICD-10-CM | POA: Insufficient documentation

## 2017-05-08 DIAGNOSIS — Z01812 Encounter for preprocedural laboratory examination: Secondary | ICD-10-CM | POA: Insufficient documentation

## 2017-05-08 DIAGNOSIS — Z01810 Encounter for preprocedural cardiovascular examination: Secondary | ICD-10-CM | POA: Insufficient documentation

## 2017-05-08 DIAGNOSIS — R001 Bradycardia, unspecified: Secondary | ICD-10-CM | POA: Diagnosis not present

## 2017-05-08 DIAGNOSIS — E119 Type 2 diabetes mellitus without complications: Secondary | ICD-10-CM | POA: Insufficient documentation

## 2017-05-08 HISTORY — DX: Personal history of urinary calculi: Z87.442

## 2017-05-08 HISTORY — DX: Chronic lymphocytic leukemia of B-cell type not having achieved remission: C91.10

## 2017-05-08 HISTORY — DX: Anxiety disorder, unspecified: F41.9

## 2017-05-08 HISTORY — DX: Sleep apnea, unspecified: G47.30

## 2017-05-08 LAB — CBC
HEMATOCRIT: 30.1 % — AB (ref 40.0–52.0)
HEMOGLOBIN: 10.1 g/dL — AB (ref 13.0–18.0)
MCH: 33.8 pg (ref 26.0–34.0)
MCHC: 33.6 g/dL (ref 32.0–36.0)
MCV: 100.6 fL — ABNORMAL HIGH (ref 80.0–100.0)
Platelets: 73 10*3/uL — ABNORMAL LOW (ref 150–440)
RBC: 3 MIL/uL — ABNORMAL LOW (ref 4.40–5.90)
RDW: 14.4 % (ref 11.5–14.5)
WBC: 15.2 10*3/uL — ABNORMAL HIGH (ref 3.8–10.6)

## 2017-05-11 MED ORDER — CIPROFLOXACIN IN D5W 400 MG/200ML IV SOLN
400.0000 mg | INTRAVENOUS | Status: AC
Start: 2017-05-11 — End: 2017-05-12
  Administered 2017-05-12: 400 mg via INTRAVENOUS

## 2017-05-12 ENCOUNTER — Encounter: Payer: Self-pay | Admitting: *Deleted

## 2017-05-12 ENCOUNTER — Ambulatory Visit: Payer: Medicare Other | Admitting: Certified Registered Nurse Anesthetist

## 2017-05-12 ENCOUNTER — Telehealth: Payer: Self-pay | Admitting: Urology

## 2017-05-12 ENCOUNTER — Encounter
Admission: RE | Disposition: A | Discharge status: Home / Self Care | Payer: Self-pay | Source: Ambulatory Visit | Attending: Urology

## 2017-05-12 ENCOUNTER — Ambulatory Visit
Admission: RE | Admit: 2017-05-12 | Discharge: 2017-05-12 | Disposition: A | Discharge status: Home / Self Care | Payer: Medicare Other | Source: Ambulatory Visit | Attending: Urology | Admitting: Urology

## 2017-05-12 DIAGNOSIS — Z880 Allergy status to penicillin: Secondary | ICD-10-CM | POA: Insufficient documentation

## 2017-05-12 DIAGNOSIS — I129 Hypertensive chronic kidney disease with stage 1 through stage 4 chronic kidney disease, or unspecified chronic kidney disease: Secondary | ICD-10-CM | POA: Diagnosis not present

## 2017-05-12 DIAGNOSIS — Z87891 Personal history of nicotine dependence: Secondary | ICD-10-CM | POA: Diagnosis not present

## 2017-05-12 DIAGNOSIS — F419 Anxiety disorder, unspecified: Secondary | ICD-10-CM | POA: Diagnosis not present

## 2017-05-12 DIAGNOSIS — E1122 Type 2 diabetes mellitus with diabetic chronic kidney disease: Secondary | ICD-10-CM | POA: Diagnosis not present

## 2017-05-12 DIAGNOSIS — I739 Peripheral vascular disease, unspecified: Secondary | ICD-10-CM | POA: Insufficient documentation

## 2017-05-12 DIAGNOSIS — Z87442 Personal history of urinary calculi: Secondary | ICD-10-CM | POA: Diagnosis not present

## 2017-05-12 DIAGNOSIS — G4730 Sleep apnea, unspecified: Secondary | ICD-10-CM | POA: Diagnosis not present

## 2017-05-12 DIAGNOSIS — N183 Chronic kidney disease, stage 3 (moderate): Secondary | ICD-10-CM | POA: Diagnosis not present

## 2017-05-12 DIAGNOSIS — Z87448 Personal history of other diseases of urinary system: Secondary | ICD-10-CM | POA: Diagnosis not present

## 2017-05-12 DIAGNOSIS — E785 Hyperlipidemia, unspecified: Secondary | ICD-10-CM | POA: Diagnosis not present

## 2017-05-12 DIAGNOSIS — N1330 Unspecified hydronephrosis: Secondary | ICD-10-CM

## 2017-05-12 DIAGNOSIS — Q613 Polycystic kidney, unspecified: Secondary | ICD-10-CM | POA: Insufficient documentation

## 2017-05-12 DIAGNOSIS — N179 Acute kidney failure, unspecified: Secondary | ICD-10-CM | POA: Insufficient documentation

## 2017-05-12 HISTORY — PX: URETEROSCOPY: SHX842

## 2017-05-12 HISTORY — PX: CYSTOSCOPY W/ URETERAL STENT PLACEMENT: SHX1429

## 2017-05-12 HISTORY — PX: CYSTOSCOPY W/ RETROGRADES: SHX1426

## 2017-05-12 LAB — POCT I-STAT 4, (NA,K, GLUC, HGB,HCT)
Glucose, Bld: 163 mg/dL — ABNORMAL HIGH (ref 65–99)
HEMATOCRIT: 29 % — AB (ref 39.0–52.0)
Hemoglobin: 9.9 g/dL — ABNORMAL LOW (ref 13.0–17.0)
POTASSIUM: 4.3 mmol/L (ref 3.5–5.1)
Sodium: 140 mmol/L (ref 135–145)

## 2017-05-12 LAB — GLUCOSE, CAPILLARY
GLUCOSE-CAPILLARY: 165 mg/dL — AB (ref 65–99)
Glucose-Capillary: 181 mg/dL — ABNORMAL HIGH (ref 65–99)

## 2017-05-12 SURGERY — CYSTOSCOPY, WITH RETROGRADE PYELOGRAM
Anesthesia: General | Laterality: Left

## 2017-05-12 MED ORDER — FENTANYL CITRATE (PF) 100 MCG/2ML IJ SOLN: 25.0000 ug | INTRAMUSCULAR | Status: DC | PRN

## 2017-05-12 MED ORDER — GLYCOPYRROLATE 0.2 MG/ML IJ SOLN
INTRAMUSCULAR | Status: DC | PRN
Start: 2017-05-12 — End: 2017-05-12
  Administered 2017-05-12: 0.2 mg via INTRAVENOUS

## 2017-05-12 MED ORDER — PROPOFOL 10 MG/ML IV BOLUS
INTRAVENOUS | Status: DC | PRN
  Administered 2017-05-12 (×2): 100 mg via INTRAVENOUS

## 2017-05-12 MED ORDER — PROPOFOL 10 MG/ML IV BOLUS
INTRAVENOUS | Status: AC
  Filled 2017-05-12: qty 20

## 2017-05-12 MED ORDER — SULFAMETHOXAZOLE-TRIMETHOPRIM 800-160 MG PO TABS: 1.0000 | ORAL_TABLET | Freq: Two times a day (BID) | ORAL | 0 refills | Status: DC

## 2017-05-12 MED ORDER — ONDANSETRON HCL 4 MG/2ML IJ SOLN
INTRAMUSCULAR | Status: AC
  Filled 2017-05-12: qty 2

## 2017-05-12 MED ORDER — EPHEDRINE SULFATE 50 MG/ML IJ SOLN
INTRAMUSCULAR | Status: DC | PRN
  Administered 2017-05-12: 15 mg via INTRAVENOUS
  Administered 2017-05-12: 10 mg via INTRAVENOUS

## 2017-05-12 MED ORDER — LIDOCAINE HCL (PF) 2 % IJ SOLN
INTRAMUSCULAR | Status: AC
  Filled 2017-05-12: qty 2

## 2017-05-12 MED ORDER — FENTANYL CITRATE (PF) 100 MCG/2ML IJ SOLN
INTRAMUSCULAR | Status: AC
  Filled 2017-05-12: qty 2

## 2017-05-12 MED ORDER — LIDOCAINE HCL (CARDIAC) 20 MG/ML IV SOLN
INTRAVENOUS | Status: DC | PRN
  Administered 2017-05-12: 100 mg via INTRAVENOUS

## 2017-05-12 MED ORDER — FAMOTIDINE 20 MG PO TABS
ORAL_TABLET | ORAL | Status: AC
  Administered 2017-05-12: 20 mg
  Filled 2017-05-12: qty 1

## 2017-05-12 MED ORDER — GLYCOPYRROLATE 0.2 MG/ML IJ SOLN
INTRAMUSCULAR | Status: AC
  Filled 2017-05-12: qty 1

## 2017-05-12 MED ORDER — FENTANYL CITRATE (PF) 100 MCG/2ML IJ SOLN
INTRAMUSCULAR | Status: DC | PRN
  Administered 2017-05-12: 25 ug via INTRAVENOUS

## 2017-05-12 MED ORDER — CIPROFLOXACIN IN D5W 400 MG/200ML IV SOLN
INTRAVENOUS | Status: AC
  Filled 2017-05-12: qty 200

## 2017-05-12 MED ORDER — HYDROCODONE-ACETAMINOPHEN 5-325 MG PO TABS: 1.0000 | ORAL_TABLET | ORAL | 0 refills | Status: DC | PRN

## 2017-05-12 MED ORDER — SODIUM CHLORIDE 0.9 % IV SOLN
INTRAVENOUS | Status: DC
  Administered 2017-05-12: 09:00:00 via INTRAVENOUS

## 2017-05-12 MED ORDER — ONDANSETRON HCL 4 MG/2ML IJ SOLN: 4.0000 mg | Freq: Once | INTRAMUSCULAR | Status: DC | PRN

## 2017-05-12 SURGICAL SUPPLY — 31 items
BACTOSHIELD CHG 4% 4OZ (MISCELLANEOUS) ×2
BASKET ZERO TIP 1.9FR (BASKET) IMPLANT
CATH URETL 5X70 OPEN END (CATHETERS) IMPLANT
CNTNR SPEC 2.5X3XGRAD LEK (MISCELLANEOUS) ×2
CONRAY 43 FOR UROLOGY 50M (MISCELLANEOUS) ×4 IMPLANT
CONT SPEC 4OZ STER OR WHT (MISCELLANEOUS) ×2
CONTAINER SPEC 2.5X3XGRAD LEK (MISCELLANEOUS) ×2 IMPLANT
FIBER LASER LITHO 273 (Laser) IMPLANT
GLOVE BIO SURGEON STRL SZ7 (GLOVE) ×8 IMPLANT
GLOVE BIO SURGEON STRL SZ7.5 (GLOVE) ×4 IMPLANT
GOWN STRL REUS W/ TWL LRG LVL3 (GOWN DISPOSABLE) ×2 IMPLANT
GOWN STRL REUS W/ TWL LRG LVL4 (GOWN DISPOSABLE) ×2 IMPLANT
GOWN STRL REUS W/TWL LRG LVL3 (GOWN DISPOSABLE) ×2
GOWN STRL REUS W/TWL LRG LVL4 (GOWN DISPOSABLE) ×2
GOWN STRL REUS W/TWL XL LVL3 (GOWN DISPOSABLE) ×4 IMPLANT
GUIDEWIRE SUPER STIFF (WIRE) IMPLANT
INTRODUCER DILATOR DOUBLE (INTRODUCER) ×4 IMPLANT
KIT RM TURNOVER CYSTO AR (KITS) ×4 IMPLANT
PACK CYSTO AR (MISCELLANEOUS) ×4 IMPLANT
SCRUB CHG 4% DYNA-HEX 4OZ (MISCELLANEOUS) ×2 IMPLANT
SENSORWIRE 0.038 NOT ANGLED (WIRE) ×4
SET CYSTO W/LG BORE CLAMP LF (SET/KITS/TRAYS/PACK) ×4 IMPLANT
SHEATH URETERAL 13/15X36 1L (SHEATH) IMPLANT
SOL .9 NS 3000ML IRR  AL (IV SOLUTION) ×2
SOL .9 NS 3000ML IRR UROMATIC (IV SOLUTION) ×2 IMPLANT
STENT URET 6FRX24 CONTOUR (STENTS) IMPLANT
STENT URET 6FRX26 CONTOUR (STENTS) IMPLANT
SURGILUBE 2OZ TUBE FLIPTOP (MISCELLANEOUS) ×4 IMPLANT
SYRINGE IRR TOOMEY STRL 70CC (SYRINGE) ×4 IMPLANT
WATER STERILE IRR 1000ML POUR (IV SOLUTION) ×4 IMPLANT
WIRE SENSOR 0.038 NOT ANGLED (WIRE) ×2 IMPLANT

## 2017-05-12 NOTE — Transfer of Care (Signed)
Immediate Anesthesia Transfer of Care Note  Patient: Patrick Payne  Procedure(s) Performed: Procedure(s): CYSTOSCOPY WITH RETROGRADE PYELOGRAM (Left) CYSTOSCOPY WITH STENT REMOVAL (Left) URETEROSCOPY  Patient Location: PACU  Anesthesia Type:General  Level of Consciousness: sedated  Airway & Oxygen Therapy: Patient Spontanous Breathing and Patient connected to face mask oxygen  Post-op Assessment: Report given to RN and Post -op Vital signs reviewed and stable  Post vital signs: Reviewed and stable  Last Vitals:  Vitals:   05/12/17 0742 05/12/17 0919  BP: 138/82 114/61  Pulse: 66 75  Resp: 16 18  Temp: 36.5 C (!) 36.3 C    Last Pain:  Vitals:   05/12/17 0742  TempSrc: Tympanic         Complications: No apparent anesthesia complications

## 2017-05-12 NOTE — H&P (View-Only) (Signed)
05/03/2017 2:38 PM   Shelda Altes Callins JR [birth year] [id number]  Referring provider: Idelle Crouch, MD [facility] [place], [place] [address]  Chief Complaint  Patient presents with  . New Patient (Initial Visit)    HPI: The patient is a 83 year old gentleman with a right polycystic kidney, functionally solitary left kidney, and history of acute on chronic renal failure who recently underwent left ureteral stent placed on 04/11/2017 for acute on chronic renal failure and new left ureteral dilation. He presents today for follow-up.  After stent placement his creatinine did improve. His baseline is approximately 1.6.  It had risen to 6.5 immediately prior to stent placement. At recent discharge it was 3.55. It had fallen to 2.8 and his nephrologist office today days ago. His renal failure at that point was thought to be multifactorial as renal ATN, obstructive uropathy, and prerenal azotemia. He did necessitate hemodialysis treatment for 2 days. This has since been stopped. He is feeling better now. 6 significant swelling has improved. He has had a 20 pound weight loss since he was admitted due to fluid retention and has since improved.  The patient does have a history of uric acid nephrolithiasis. No stones seen on recent CT.  Per the patient, upon placement of a left ureteral stent a large amount of debris was seen on 04/11/2017. He has never had this problem before. He was experiencing left lower quadrant pain that resolved after stent placement at that time.   PMH: Past Medical History:  Diagnosis Date  . CKD (chronic kidney disease), stage III   . Diabetes mellitus without complication ([place])   . HLD (hyperlipidemia)   . HTN (hypertension)   . PKD (polycystic kidney disease)     Surgical History: Past Surgical History:  Procedure Laterality Date  . CARDIAC CATHETERIZATION  12/1987  . CENTRAL LINE INSERTION N/A 04/11/2017   Procedure: Central Line  Insertion;  Surgeon: Delana Meyer Dolores Lory, MD;  Location: [facility];  Service: Cardiovascular;  Laterality: N/A;  . CHOLECYSTECTOMY    . CYSTOSCOPY WITH STENT PLACEMENT Left 04/11/2017   Procedure: CYSTOSCOPY WITH STENT PLACEMENT;  Surgeon: Festus Aloe, MD;  Location: [facility];  Service: Urology;  Laterality: Left;    Home Medications:  Allergies as of 05/03/2017      Reactions   Celecoxib Other (See Comments)   Increased Blood Pressure   Chlorpromazine Nausea Only   Prednisone Other (See Comments)   Diabetic   Amoxicillin-pot Clavulanate Rash   Penicillin V Potassium Rash      Medication List       Accurate as of 05/03/17  2:38 PM. Always use your most recent med list.          allopurinol 100 MG tablet Commonly known as:  ZYLOPRIM Take 100 mg by mouth daily.   atenolol 50 MG tablet Commonly known as:  TENORMIN Take 50 mg by mouth daily.   atorvastatin 10 MG tablet Commonly known as:  LIPITOR Take 10 mg by mouth at bedtime.   calcipotriene-betamethasone ointment Commonly known as:  TACLONEX Apply 1 application topically as needed (psoriasis).   clonazePAM 1 MG tablet Commonly known as:  KLONOPIN Take 1 mg by mouth at bedtime.   cyanocobalamin 500 MCG tablet Take 500 mcg by mouth at bedtime.   EYLEA 2 MG/0.05ML Soln Generic drug:  Aflibercept Inject 1 Dose into the eye as directed. One injection into left eye every 4-6 weeks   fexofenadine 180  MG tablet Commonly known as:  ALLEGRA Take 180 mg by mouth daily as needed for allergies.   finasteride 5 MG tablet Commonly known as:  PROSCAR Take 5 mg by mouth daily.   LORazepam 1 MG tablet Commonly known as:  ATIVAN Take 1 mg by mouth every 8 (eight) hours as needed for anxiety.   omega-3 acid ethyl esters 1 g capsule Commonly known as:  LOVAZA Take 2 g by mouth at bedtime.   PRESERVISION AREDS 2 PO Take 1 capsule by mouth 2 (two) times daily.   tamsulosin 0.4 MG Caps capsule Commonly  known as:  FLOMAX Take 0.4 mg by mouth daily.   triamcinolone 55 MCG/ACT Aero nasal inhaler Commonly known as:  NASACORT Place 2 sprays into the nose 2 (two) times daily as needed (allergies).   Vitamin D-3 5000 units Tabs Take 5,000 Units by mouth at bedtime.       Allergies:  Allergies  Allergen Reactions  . Celecoxib Other (See Comments)    Increased Blood Pressure  . Chlorpromazine Nausea Only  . Prednisone Other (See Comments)    Diabetic  . Amoxicillin-Pot Clavulanate Rash  . Penicillin V Potassium Rash    Family History: Family History  Problem Relation Age of Onset  . Cancer Mother   . Varicose Veins Mother   . Cancer Father     Social History:  reports that he has quit smoking. He has never used smokeless tobacco. He reports that he does not drink alcohol or use drugs.  ROS: UROLOGY Frequent Urination?: Yes Hard to postpone urination?: No Burning/pain with urination?: No Get up at night to urinate?: Yes Leakage of urine?: No Urine stream starts and stops?: No Trouble starting stream?: No Do you have to strain to urinate?: No Blood in urine?: No Urinary tract infection?: No Sexually transmitted disease?: No Injury to kidneys or bladder?: Yes Painful intercourse?: No Weak stream?: No Erection problems?: No Penile pain?: No  Gastrointestinal Nausea?: No Vomiting?: No Indigestion/heartburn?: No Diarrhea?: No Constipation?: No  Constitutional Fever: No Night sweats?: No Weight loss?: Yes Fatigue?: No  Skin Skin rash/lesions?: No Itching?: No  Eyes Blurred vision?: No Double vision?: No  Ears/Nose/Throat Sore throat?: No Sinus problems?: No  Hematologic/Lymphatic Swollen glands?: No Easy bruising?: Yes  Cardiovascular Leg swelling?: No Chest pain?: No  Respiratory Cough?: No Shortness of breath?: No  Endocrine Excessive thirst?: No  Musculoskeletal Back pain?: No Joint pain?: No  Neurological Headaches?:  No Dizziness?: No  Psychologic Depression?: No Anxiety?: No  Physical Exam: BP 125/67   Pulse 70   Ht 5\' 7"  (1.702 m)   Wt 223 lb (101.2 kg)   BMI 34.93 kg/m   Constitutional:  Alert and oriented, No acute distress. HEENT: [place] AT, moist mucus membranes.  Trachea midline, no masses. Cardiovascular: No clubbing, cyanosis, or edema. Respiratory: Normal respiratory effort, no increased work of breathing. GI: Abdomen is soft, nontender, nondistended, no abdominal masses GU: No CVA tenderness.  Skin: No rashes, bruises or suspicious lesions. Lymph: No cervical or inguinal adenopathy. Neurologic: Grossly intact, no focal deficits, moving all 4 extremities. Psychiatric: Normal mood and affect.  Laboratory Data: Lab Results  Component Value Date   WBC 14.0 (H) 04/15/2017   HGB 8.4 (L) 04/15/2017   HCT 24.7 (L) 04/15/2017   MCV 100.3 (H) 04/15/2017   PLT 73 (L) 04/15/2017    Lab Results  Component Value Date   CREATININE 3.55 (H) 04/18/2017    No results found for: PSA  No results found for: TESTOSTERONE  No results found for: HGBA1C  Urinalysis    Component Value Date/Time   COLORURINE RED (A) 04/18/2017 1007   APPEARANCEUR HAZY (A) 04/18/2017 1007   LABSPEC 1.008 04/18/2017 1007   PHURINE 6.0 04/18/2017 1007   GLUCOSEU NEGATIVE 04/18/2017 1007   HGBUR MODERATE (A) 04/18/2017 1007   BILIRUBINUR NEGATIVE 04/18/2017 1007   KETONESUR NEGATIVE 04/18/2017 1007   PROTEINUR 100 (A) 04/18/2017 1007   NITRITE NEGATIVE 04/18/2017 1007   LEUKOCYTESUR TRACE (A) 04/18/2017 1007    Pertinent Imaging: CT reviewed as above  Assessment & Plan:    1. Left hydronephrosis 2. Right polycystic kidney disease 3. Functionally solitary left kidney I discussed with the patient that the reason for his renal failure is not exactly clear. I do believe that ureteral obstruction did place some compliant given that obstructive material was seen at the time of stent placement with large  return of urine after his stent was placed. I discussed with the patient I do not feel comfortable removing his stent as we are not completely sure if there is residual obstruction. We discussed undergoing cystoscopy, left retrograde pyelogram, possible left ureteroscopy, left ureteral stent exchange versus removal in the operating room. The patient is agreeable to proceeding with this. This will allow [place] to ensure that his kidney drains on retrograde pyelogram before removing his stent. If there is not drainage, we will proceed with ureteroscopy to see if we can find a reason for obstruction. All questions were answered. The risks and benefits were discussed. The patient is agreeable to proceeding.  We'll plan to obtain a renal ultrasound shortly after stent removal as well as a BMP to ensure that he does not again developed acute renal failure as a result of obstruction.  Nickie Retort, MD  [facility] [address], [place], [place] [address] [phone]

## 2017-05-12 NOTE — Anesthesia Post-op Follow-up Note (Cosign Needed)
Anesthesia QCDR form completed.        

## 2017-05-12 NOTE — Anesthesia Procedure Notes (Signed)
Procedure Name: LMA Insertion Date/Time: 05/12/2017 8:52 AM Performed by: Darlyne Russian Pre-anesthesia Checklist: Patient identified, Emergency Drugs available, Suction available, Patient being monitored and Timeout performed Patient Re-evaluated:Patient Re-evaluated prior to induction Oxygen Delivery Method: Circle system utilized Preoxygenation: Pre-oxygenation with 100% oxygen Induction Type: IV induction Ventilation: Mask ventilation without difficulty LMA: LMA inserted LMA Size: 4.0 Number of attempts: 1 Placement Confirmation: positive ETCO2 and breath sounds checked- equal and bilateral Tube secured with: Tape Dental Injury: Teeth and Oropharynx as per pre-operative assessment

## 2017-05-12 NOTE — Telephone Encounter (Signed)
-----   Message from Nickie Retort, MD sent at 05/12/2017  9:34 AM EDT ----- Patient needs to see me in one month with renal u/s and BMP prior. Thanks.

## 2017-05-12 NOTE — Anesthesia Preprocedure Evaluation (Signed)
Anesthesia Evaluation  Patient identified by MRN, date of birth, ID band Patient awake    Reviewed: Allergy & Precautions, NPO status , Patient's Chart, lab work & pertinent test results, reviewed documented beta blocker date and time   Airway Mallampati: III  TM Distance: >3 FB     Dental  (+) Chipped   Pulmonary sleep apnea , former smoker,           Cardiovascular hypertension, Pt. on medications + Peripheral Vascular Disease       Neuro/Psych Anxiety    GI/Hepatic   Endo/Other  diabetes, Type 2  Renal/GU Renal disease     Musculoskeletal   Abdominal   Peds  Hematology   Anesthesia Other Findings   Reproductive/Obstetrics                             Anesthesia Physical Anesthesia Plan  ASA: III  Anesthesia Plan: General   Post-op Pain Management:    Induction: Intravenous  PONV Risk Score and Plan:   Airway Management Planned:   Additional Equipment:   Intra-op Plan:   Post-operative Plan:   Informed Consent: I have reviewed the patients History and Physical, chart, labs and discussed the procedure including the risks, benefits and alternatives for the proposed anesthesia with the patient or authorized representative who has indicated his/her understanding and acceptance.     Plan Discussed with: CRNA  Anesthesia Plan Comments:         Anesthesia Quick Evaluation

## 2017-05-12 NOTE — Anesthesia Postprocedure Evaluation (Signed)
Anesthesia Post Note  Patient: Patrick Payne  Procedure(s) Performed: Procedure(s) (LRB): CYSTOSCOPY WITH RETROGRADE PYELOGRAM (Left) CYSTOSCOPY WITH STENT REMOVAL (Left) URETEROSCOPY  Patient location during evaluation: PACU Anesthesia Type: General Level of consciousness: awake and alert Pain management: pain level controlled Vital Signs Assessment: post-procedure vital signs reviewed and stable Respiratory status: spontaneous breathing, nonlabored ventilation, respiratory function stable and patient connected to nasal cannula oxygen Cardiovascular status: blood pressure returned to baseline and stable Postop Assessment: no signs of nausea or vomiting Anesthetic complications: no     Last Vitals:  Vitals:   05/12/17 0958 05/12/17 1023  BP: (!) 141/67 135/65  Pulse: 66 64  Resp: 16   Temp: (!) 35.9 C     Last Pain:  Vitals:   05/12/17 0958  TempSrc: Temporal  PainSc:                  Joven Mom S

## 2017-05-12 NOTE — Interval H&P Note (Signed)
History and Physical Interval Note:  05/12/2017 7:52 AM  Patrick Payne  has presented today for surgery, with the diagnosis of left hypronephrosis  The various methods of treatment have been discussed with the patient and family. After consideration of risks, benefits and other options for treatment, the patient has consented to  Procedure(s): CYSTOSCOPY WITH RETROGRADE PYELOGRAM (Left) URETEROSCOPY WITH HOLMIUM LASER LITHOTRIPSY (Left) CYSTOSCOPY WITH STENT REPLACEMENT VS.REMOVAL (Left) as a surgical intervention .  The patient's history has been reviewed, patient examined, no change in status, stable for surgery.  I have reviewed the patient's chart and labs.  Questions were answered to the patient's satisfaction.    RRR Lungs clear  Nickie Retort

## 2017-05-12 NOTE — Op Note (Signed)
Date of procedure: 05/12/17  Preoperative diagnosis:  1. Left hydronephrosis 2. Functionally solitary left kidney 3. Right polycystic kidney disease 4. History of acute on chronic renal failure  Postoperative diagnosis:  1. Same   Procedure: 1.  Cystoscopy 2. Left retrograde pyelogram with interpretation 3. Left ureteroscopy 4. Left ureteral stent removal  Surgeon: Baruch Gouty, MD  Anesthesia: General  Complications: None  Intraoperative findings:  The patient's left ureteral stent was removed and left retrograde pyelogram showed good drainage of contrast. However, more debris was drained from his left collecting system, so a left ureteroscopy was performed which was negative. He had no retention of contrast at the end of the procedure.  EBL:  None  Specimens:  None  Drains:  None  Disposition: Stable to the postanesthesia care unit  Indication for procedure: The patient is a 83 y.o. male with  history of a right polycystic kidney and functionally solitary left kidney present in the hospital with renal failure requiring dialysis 2. That time he was found to have left hydronephrosis and underwent left ureteral stent placement. Upon placing the stent there is a large amount of debris that was evacuated from the left ureter in a large amount of retained urine that drained. She had no evidence of nephrolithiasis either on CT imaging or intraoperatively during his first procedure. He presents today for stent removal and further evaluation as to why his left ureter became obstructed..  After reviewing the management options for treatment, the patient elected to proceed with the above surgical procedure(s). We have discussed the potential benefits and risks of the procedure, side effects of the proposed treatment, the likelihood of the patient achieving the goals of the procedure, and any potential problems that might occur during the procedure or recuperation. Informed consent has been  obtained.  Description of procedure: The patient was met in the preoperative area. All risks, benefits, and indications of the procedure were described in great detail. The patient consented to the procedure. Preoperative antibiotics were given. The patient was taken to the operative theater. General anesthesia was induced per the anesthesia service. The patient was then placed in the dorsal lithotomy position and prepped and draped in the usual sterile fashion. A preoperative timeout was called.   A 20 French 30 cystoscope was inserted into the patient's bladder per urethra atraumatically. The patient's left ureteral stent was then removed. A left retrograde pyelogram was obtained which showed no filling defects. There is good drainage of contrast noted at this time with good ureteral jets. However, small fragments of debris then came out through the left ureter. These at first appeared to be kidney stones but on manual exam nation after evacuation it was soft debris material. At this point, a left ureteroscopy was decided to be performed to ensure there is no more residual debris. A sensor wire was advanced to level of renal pelvis under fluoroscopy. Over this, a flexible ureteroscope was then advanced to level of the renal pelvis atraumatically. Sensor wire was then removed. Pan nephroscopy was performed revealing no further debride any of the calyces. This was guided with fluoroscopy to ensure every area was examined. The ureteroscope was then carefully withdrawn through the ureter with there is no further debris noted here either. Fluoroscopy was then used and which showed no residual contrast in the collecting system. This point, the patient's kidney was believed to be draining well stenosis that was replaced. His bladder was drained his wound from anesthesia and transferred in stable condition  to the postanesthesia care unit.  Plan:  the patient will follow-up in one month with renal ultrasound and  BMP prior. His family was given strict return criteria if he stops producing urine to be seen immediately.  Baruch Gouty, M.D.

## 2017-05-12 NOTE — Telephone Encounter (Signed)
Can you please order the RUS for [employer], [employer]

## 2017-05-17 ENCOUNTER — Telehealth: Payer: Self-pay | Admitting: Urology

## 2017-05-17 NOTE — Telephone Encounter (Signed)
-----   Message from Nickie Retort, MD sent at 05/12/2017  9:34 AM EDT ----- Patient needs to see me in one month with renal u/s and BMP prior. Thanks.

## 2017-05-17 NOTE — Telephone Encounter (Signed)
Done ° ° °Patrick Payne °

## 2017-05-24 ENCOUNTER — Other Ambulatory Visit: Payer: Self-pay

## 2017-06-09 ENCOUNTER — Other Ambulatory Visit: Payer: Self-pay

## 2017-06-09 ENCOUNTER — Ambulatory Visit
Admission: RE | Admit: 2017-06-09 | Discharge: 2017-06-09 | Disposition: A | Discharge status: Home / Self Care | Payer: Medicare Other | Source: Ambulatory Visit | Attending: Urology | Admitting: Urology

## 2017-06-09 ENCOUNTER — Other Ambulatory Visit: Payer: Medicare Other

## 2017-06-09 DIAGNOSIS — N281 Cyst of kidney, acquired: Secondary | ICD-10-CM | POA: Insufficient documentation

## 2017-06-09 DIAGNOSIS — N1330 Unspecified hydronephrosis: Secondary | ICD-10-CM

## 2017-06-10 LAB — BASIC METABOLIC PANEL
BUN / CREAT RATIO: 18 (ref 10–24)
BUN: 26 mg/dL (ref 8–27)
CO2: 21 mmol/L (ref 20–29)
CREATININE: 1.44 mg/dL — AB (ref 0.76–1.27)
Calcium: 8.6 mg/dL (ref 8.6–10.2)
Chloride: 104 mmol/L (ref 96–106)
GFR, EST AFRICAN AMERICAN: 53 mL/min/{1.73_m2} — AB (ref >59)
GFR, EST NON AFRICAN AMERICAN: 46 mL/min/{1.73_m2} — AB (ref >59)
GLUCOSE: 159 mg/dL — AB (ref 65–99)
Potassium: 4.5 mmol/L (ref 3.5–5.2)
SODIUM: 138 mmol/L (ref 134–144)

## 2017-06-14 ENCOUNTER — Ambulatory Visit: Payer: Medicare Other | Admitting: Urology

## 2017-06-14 ENCOUNTER — Encounter: Payer: Self-pay | Admitting: Urology

## 2017-06-14 VITALS — BP 136/78 | HR 59 | Ht 67.0 in | Wt 221.3 lb

## 2017-06-14 DIAGNOSIS — N1339 Other hydronephrosis: Secondary | ICD-10-CM | POA: Diagnosis not present

## 2017-06-14 DIAGNOSIS — Q613 Polycystic kidney, unspecified: Secondary | ICD-10-CM

## 2017-06-14 DIAGNOSIS — N400 Enlarged prostate without lower urinary tract symptoms: Secondary | ICD-10-CM | POA: Diagnosis not present

## 2017-06-14 NOTE — Progress Notes (Signed)
06/14/2017 10:08 AM   Patrick Altes Mccampbell Jr. 11-06-1933 [id number]  Referring provider: Idelle Crouch, MD [facility] [place], [place] [address]  Chief Complaint  Patient presents with  . Follow-up    RUS results    HPI: The patient is an 83 year old gentleman with a history of a right polycystic kidney functionally solitary left kidney who presents today after left ureteral stent removal. He initially presented to the hospital with renal failure requiring dialysis 2. He was found to have left hydronephrosis and underwent left ureteral stent placement. Upon placing the stent a large amount of debris was evacuated from the ureter and retained urine drained. He had no evidence of nephrolithiasis on CT imaging or intraoperatively at that procedure. He underwent repeat surgery a few weeks later which showed a small amount of residual debris on ureteroscopy. Postdrainage films were unremarkable for retention of contrast. So stent was removed.  He returns 1 month after undergoing this procedure with a renal ultrasound. His left hydronephrosis has resolved. His right polycystic kidney is similar in appearance as previous studies. He also had a strong left ureteral jet. His repeat BMP shows a creatinine 1.4 for which is at baseline.   The patient also is on Flomax and finasteride for BPH started by another urologist. He feels that his stream is improved on this medication. He feels he empties his bladder. He does not have to strain as much. He is overall happy being on these meds.   PMH: Past Medical History:  Diagnosis Date  . Anxiety   . CKD (chronic kidney disease), stage III   . CLL (chronic lymphocytic leukemia) ([place])   . Diabetes mellitus without complication ([place])   . History of kidney stones   . HLD (hyperlipidemia)   . HTN (hypertension)   . PKD (polycystic kidney disease)   . Sleep apnea     Surgical History: Past Surgical History:  Procedure  Laterality Date  . CARDIAC CATHETERIZATION  12/1987  . CENTRAL LINE INSERTION N/A 04/11/2017   Procedure: Central Line Insertion;  Surgeon: Delana Meyer Dolores Lory, MD;  Location: [place] [facility];  Service: Cardiovascular;  Laterality: N/A;  . CERVICAL FUSION    . CHOLECYSTECTOMY    . CYSTOSCOPY W/ RETROGRADES Left 05/12/2017   Procedure: CYSTOSCOPY WITH RETROGRADE PYELOGRAM;  Surgeon: Nickie Retort, MD;  Location: [facility];  Service: Urology;  Laterality: Left;  . CYSTOSCOPY W/ URETERAL STENT PLACEMENT Left 05/12/2017   Procedure: CYSTOSCOPY WITH STENT REMOVAL;  Surgeon: Nickie Retort, MD;  Location: [facility];  Service: Urology;  Laterality: Left;  . CYSTOSCOPY WITH STENT PLACEMENT Left 04/11/2017   Procedure: CYSTOSCOPY WITH STENT PLACEMENT;  Surgeon: Festus Aloe, MD;  Location: [facility];  Service: Urology;  Laterality: Left;  . URETEROSCOPY  05/12/2017   Procedure: URETEROSCOPY;  Surgeon: Nickie Retort, MD;  Location: [facility];  Service: Urology;;    Home Medications:  Allergies as of 06/14/2017      Reactions   Celecoxib Other (See Comments)   Increased Blood Pressure   Chlorpromazine Nausea Only   Prednisone Other (See Comments)   Diabetic   Amoxicillin-pot Clavulanate Rash   Has patient had a PCN reaction causing immediate rash, facial/tongue/throat swelling, SOB or lightheadedness with hypotension: No Has patient had a PCN reaction causing severe rash involving mucus membranes or skin necrosis: No Has patient had a PCN reaction that required hospitalization: No Has patient had a PCN reaction occurring within the  last 10 years: Yes If all of the above answers are "NO", then may proceed with Cephalosporin use.   Penicillin V Potassium Rash      Medication List       Accurate as of 06/14/17 10:08 AM. Always use your most recent med list.          allopurinol 100 MG tablet Commonly known as:  ZYLOPRIM Take 100 mg by mouth daily.   atenolol 50 MG  tablet Commonly known as:  TENORMIN Take 50 mg by mouth daily.   atorvastatin 10 MG tablet Commonly known as:  LIPITOR Take 10 mg by mouth at bedtime.   calcipotriene-betamethasone ointment Commonly known as:  TACLONEX Apply 1 application topically as needed (psoriasis).   clonazePAM 1 MG tablet Commonly known as:  KLONOPIN Take 1 mg by mouth at bedtime.   cyanocobalamin 500 MCG tablet Take 500 mcg by mouth at bedtime.   EYLEA 2 MG/0.05ML Soln Generic drug:  Aflibercept Inject 1 Dose into the eye as directed. One injection into left eye every 8-9 weeks   fexofenadine 180 MG tablet Commonly known as:  ALLEGRA Take 180 mg by mouth daily as needed for allergies.   finasteride 5 MG tablet Commonly known as:  PROSCAR Take 5 mg by mouth daily.   FISH OIL PO Take 2 capsules by mouth every evening.   ibuprofen 200 MG tablet Commonly known as:  ADVIL,MOTRIN Take 400 mg by mouth every 6 (six) hours as needed for mild pain.   insulin glargine 100 UNIT/ML injection Commonly known as:  LANTUS Inject 10 Units into the skin daily.   LORazepam 1 MG tablet Commonly known as:  ATIVAN Take 1 mg by mouth every 8 (eight) hours as needed for anxiety (takes 1 tablet every morning.Rarely has to take again during the day).   PRESERVISION AREDS 2 PO Take 1 capsule by mouth 2 (two) times daily.   tamsulosin 0.4 MG Caps capsule Commonly known as:  FLOMAX Take 0.4 mg by mouth daily after supper.   triamcinolone 55 MCG/ACT Aero nasal inhaler Commonly known as:  NASACORT Place 2 sprays into the nose 2 (two) times daily as needed (allergies).   Vitamin D-3 5000 units Tabs Take 5,000 Units by mouth at bedtime.       Allergies:  Allergies  Allergen Reactions  . Celecoxib Other (See Comments)    Increased Blood Pressure  . Chlorpromazine Nausea Only  . Prednisone Other (See Comments)    Diabetic  . Amoxicillin-Pot Clavulanate Rash    Has patient had a PCN reaction causing  immediate rash, facial/tongue/throat swelling, SOB or lightheadedness with hypotension: No Has patient had a PCN reaction causing severe rash involving mucus membranes or skin necrosis: No Has patient had a PCN reaction that required hospitalization: No Has patient had a PCN reaction occurring within the last 10 years: Yes If all of the above answers are "NO", then may proceed with Cephalosporin use.   Marland Kitchen Penicillin V Potassium Rash    Family History: Family History  Problem Relation Age of Onset  . Cancer Mother   . Varicose Veins Mother   . Cancer Father     Social History:  reports that he has quit smoking. He has never used smokeless tobacco. He reports that he does not drink alcohol or use drugs.  ROS: UROLOGY Frequent Urination?: No Hard to postpone urination?: No Burning/pain with urination?: No Get up at night to urinate?: Yes Leakage of urine?: No Urine stream starts  and stops?: No Trouble starting stream?: No Do you have to strain to urinate?: No Blood in urine?: No Urinary tract infection?: No Sexually transmitted disease?: No Injury to kidneys or bladder?: No Painful intercourse?: No Weak stream?: No Erection problems?: No Penile pain?: No  Gastrointestinal Nausea?: No Vomiting?: No Indigestion/heartburn?: No Diarrhea?: No Constipation?: No  Constitutional Fever: No Night sweats?: No Weight loss?: Yes Fatigue?: No  Skin Skin rash/lesions?: No Itching?: No  Eyes Blurred vision?: No Double vision?: No  Ears/Nose/Throat Sore throat?: No Sinus problems?: No  Hematologic/Lymphatic Swollen glands?: No Easy bruising?: Yes  Cardiovascular Leg swelling?: No Chest pain?: No  Respiratory Cough?: No Shortness of breath?: No  Endocrine Excessive thirst?: No  Musculoskeletal Back pain?: No Joint pain?: No  Neurological Headaches?: No Dizziness?: No  Psychologic Depression?: No Anxiety?: No  Physical Exam: BP 136/78 (BP Location:  Left Arm, Patient Position: Sitting, Cuff Size: Normal)   Pulse (!) 59   Ht 5\' 7"  (1.702 m)   Wt 221 lb 4.8 oz (100.4 kg)   BMI 34.66 kg/m   Constitutional:  Alert and oriented, No acute distress. HEENT: [place] AT, moist mucus membranes.  Trachea midline, no masses. Cardiovascular: No clubbing, cyanosis, or edema. Respiratory: Normal respiratory effort, no increased work of breathing. GI: Abdomen is soft, nontender, nondistended, no abdominal masses GU: No CVA tenderness.  Skin: No rashes, bruises or suspicious lesions. Lymph: No cervical or inguinal adenopathy. Neurologic: Grossly intact, no focal deficits, moving all 4 extremities. Psychiatric: Normal mood and affect.  Laboratory Data: Lab Results  Component Value Date   WBC 15.2 (H) 05/08/2017   HGB 9.9 (L) 05/12/2017   HCT 29.0 (L) 05/12/2017   MCV 100.6 (H) 05/08/2017   PLT 73 (L) 05/08/2017    Lab Results  Component Value Date   CREATININE 1.44 (H) 06/09/2017    No results found for: PSA  No results found for: TESTOSTERONE  No results found for: HGBA1C  Urinalysis    Component Value Date/Time   COLORURINE RED (A) 04/18/2017 1007   APPEARANCEUR HAZY (A) 04/18/2017 1007   LABSPEC 1.008 04/18/2017 1007   PHURINE 6.0 04/18/2017 1007   GLUCOSEU NEGATIVE 04/18/2017 1007   HGBUR MODERATE (A) 04/18/2017 1007   BILIRUBINUR NEGATIVE 04/18/2017 1007   KETONESUR NEGATIVE 04/18/2017 1007   PROTEINUR 100 (A) 04/18/2017 1007   NITRITE NEGATIVE 04/18/2017 1007   LEUKOCYTESUR TRACE (A) 04/18/2017 1007     Assessment & Plan:    1. History of left hydronephrosis This has resolved. His creatinine has improved to slightly better than baseline. Exact etiology unclear. However, this time it has resolved so no further workup is needed.  2. BPH Continue Flomax and finasteride. Follow-up annually or sooner if problems arise.  Return in about 1 year (around 06/14/2018).  Nickie Retort, MD  [facility] [address], [place], [place] [address] [phone]

## 2017-08-14 ENCOUNTER — Telehealth (INDEPENDENT_AMBULATORY_CARE_PROVIDER_SITE_OTHER): Payer: Self-pay

## 2017-08-14 NOTE — Telephone Encounter (Signed)
Patient was advise with information to schedule next ultrasound in January

## 2017-11-07 ENCOUNTER — Ambulatory Visit (INDEPENDENT_AMBULATORY_CARE_PROVIDER_SITE_OTHER): Payer: Medicare Other

## 2017-11-07 ENCOUNTER — Encounter (INDEPENDENT_AMBULATORY_CARE_PROVIDER_SITE_OTHER): Payer: Self-pay | Admitting: Vascular Surgery

## 2017-11-07 ENCOUNTER — Ambulatory Visit (INDEPENDENT_AMBULATORY_CARE_PROVIDER_SITE_OTHER): Payer: Medicare Other | Admitting: Vascular Surgery

## 2017-11-07 VITALS — BP 155/81 | HR 64 | Resp 15 | Ht 66.0 in | Wt 218.0 lb

## 2017-11-07 DIAGNOSIS — E118 Type 2 diabetes mellitus with unspecified complications: Secondary | ICD-10-CM

## 2017-11-07 DIAGNOSIS — I10 Essential (primary) hypertension: Secondary | ICD-10-CM

## 2017-11-07 DIAGNOSIS — I714 Abdominal aortic aneurysm, without rupture, unspecified: Secondary | ICD-10-CM

## 2017-11-07 IMAGING — CT CT ABD-PELV W/O CM
2 of 4 series · 15 of 46 positions shown, 17 images · non-contrast
Comparison: CT 03/02/2017

CLINICAL DATA: Left lower quadrant pain.  Acute renal failure.

EXAM:
CT ABDOMEN AND PELVIS WITHOUT CONTRAST
TECHNIQUE: Multidetector CT imaging of the abdomen and pelvis was performed
following the standard protocol without IV contrast.

[Series 2: routine abd/pel wo · axial · 0.95mm/px · z∈[-1184,-704]mm · 12 of 106 slices shown, 14 images]
[im 5/106  soft-tissue]
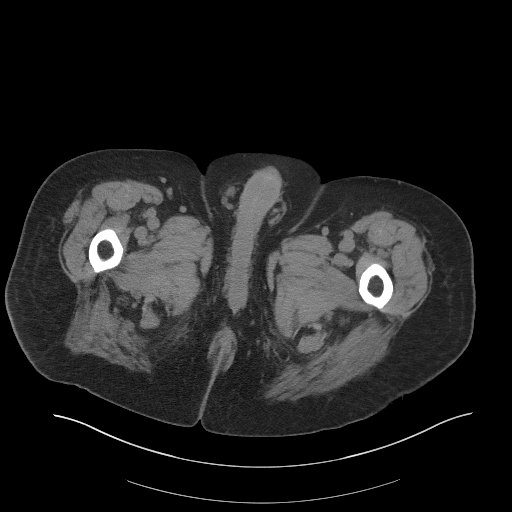
[im 5/106  bone]
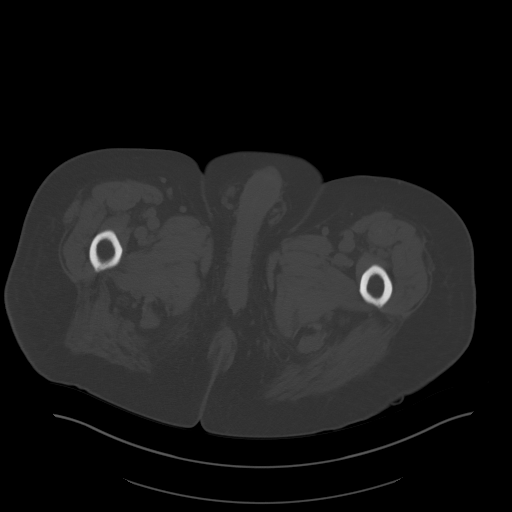
[im 14/106  soft-tissue]
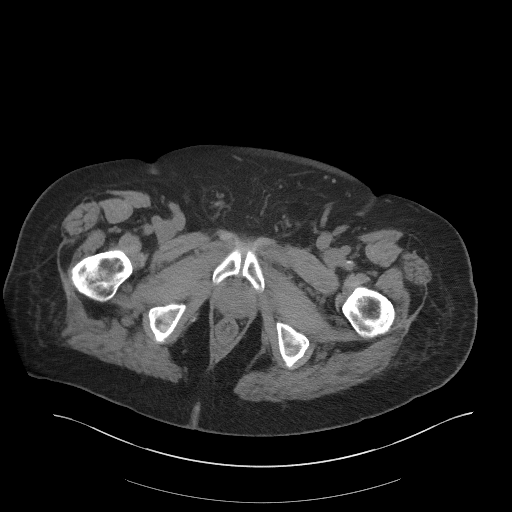
[im 23/106  soft-tissue]
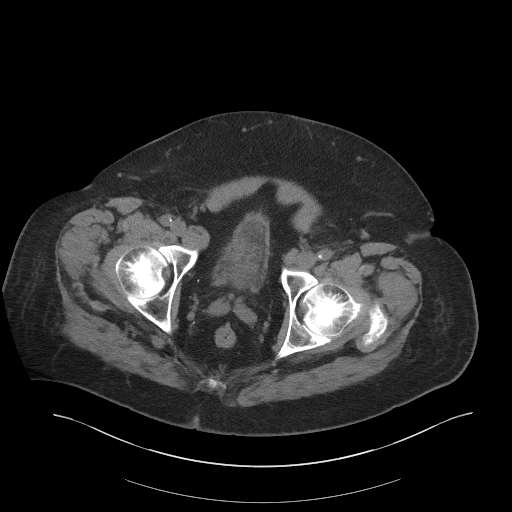
[im 32/106  soft-tissue]
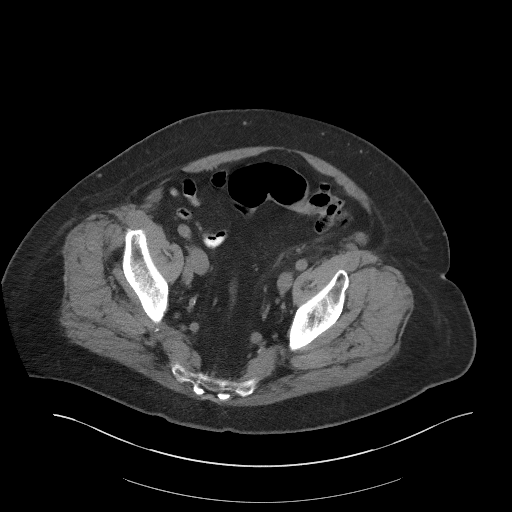
[im 42/106  soft-tissue]
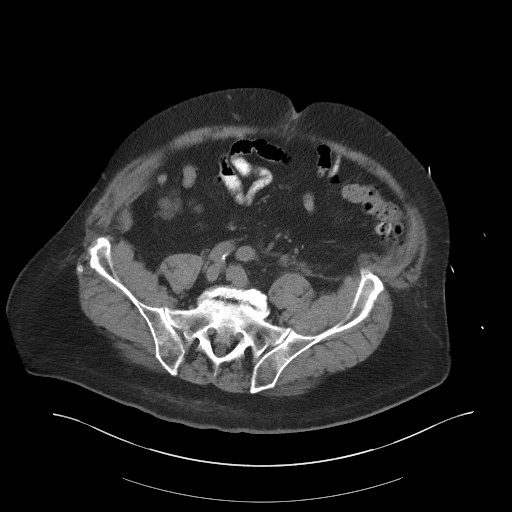
[im 51/106  soft-tissue]
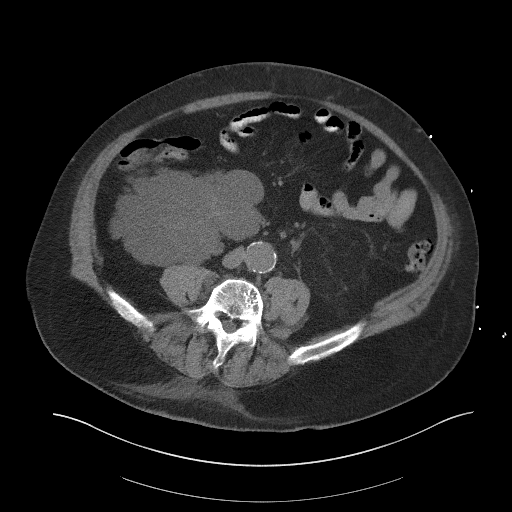
[im 55/106  soft-tissue]
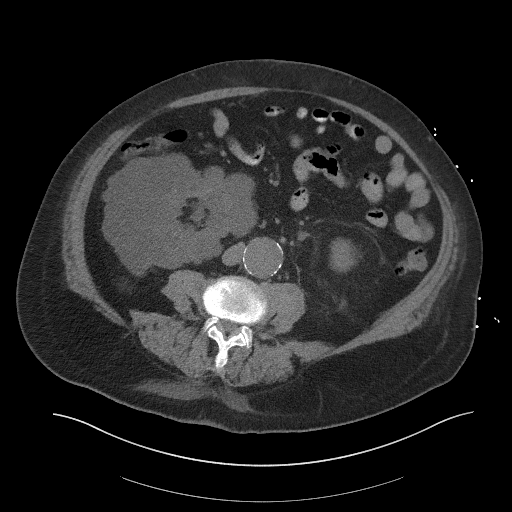
[im 64/106  soft-tissue]
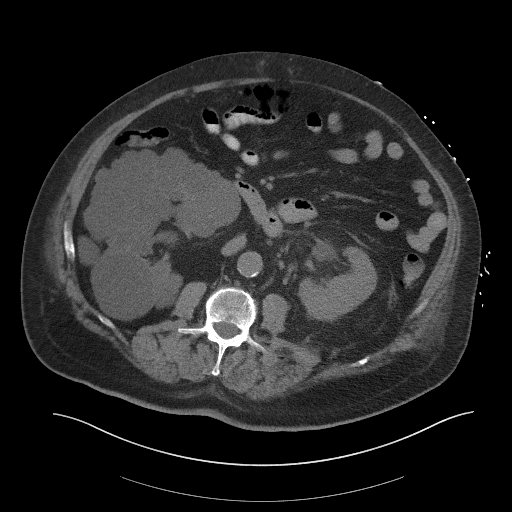
[im 74/106  soft-tissue]
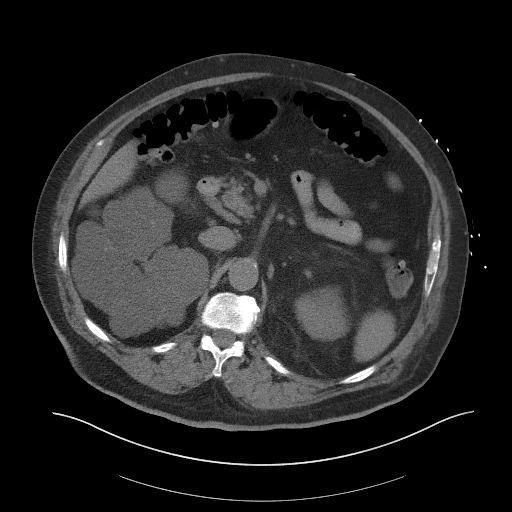
[im 74/106  bone]
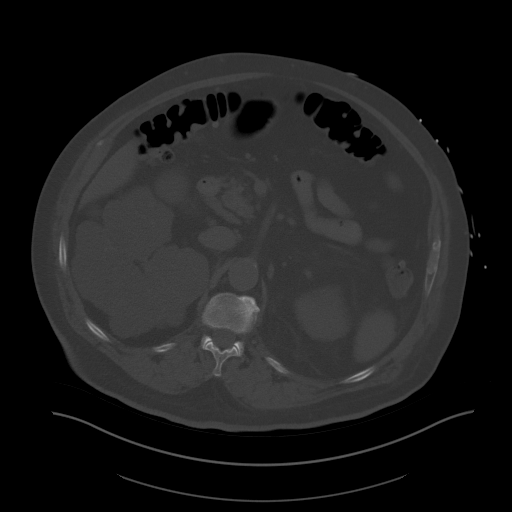
[im 83/106  soft-tissue]
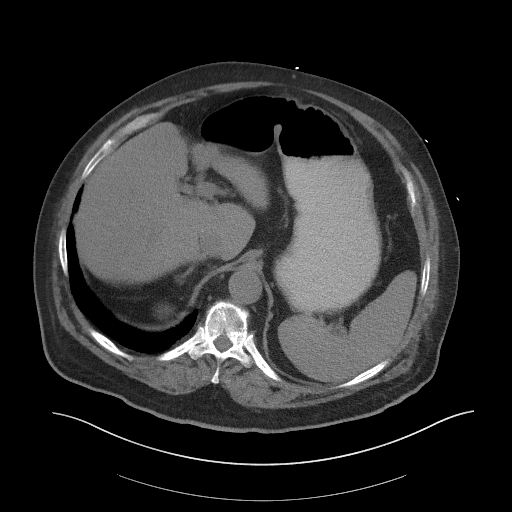
[im 92/106  soft-tissue]
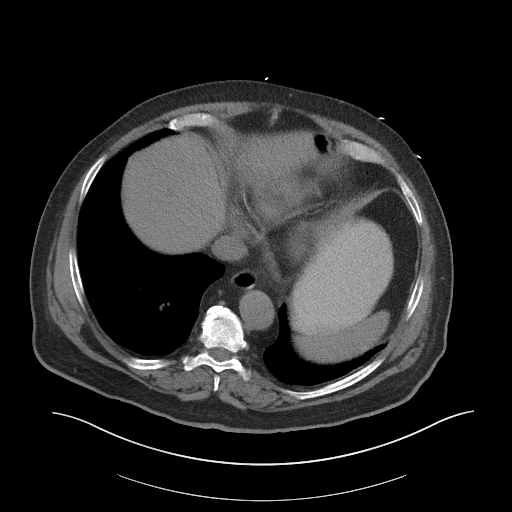
[im 101/106  soft-tissue]
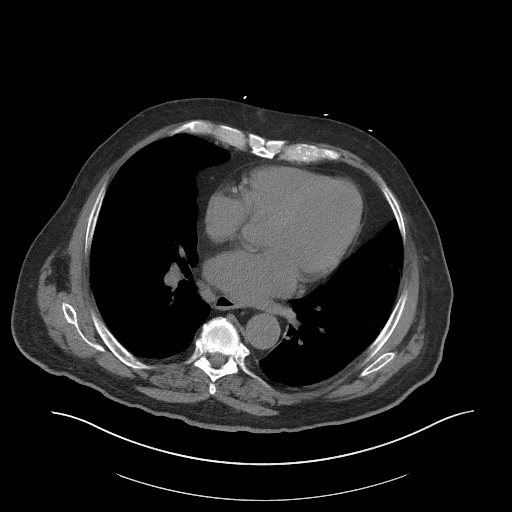

[Series 5: coronal st · coronal · 0.87mm/px · 3 of 112 slices shown]
[im 38/112  soft-tissue]
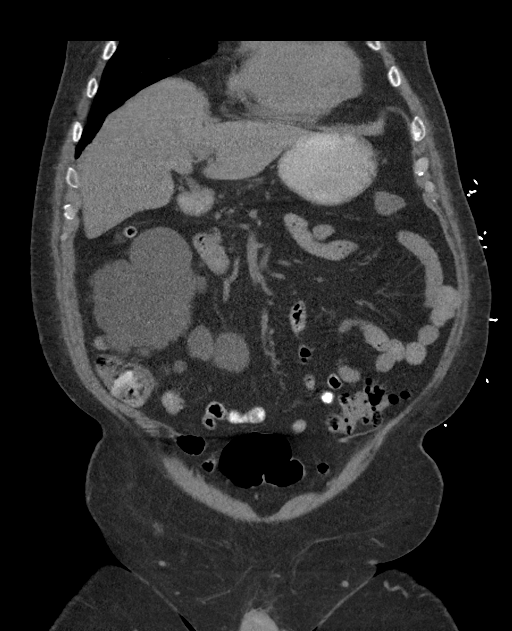
[im 50/112  soft-tissue]
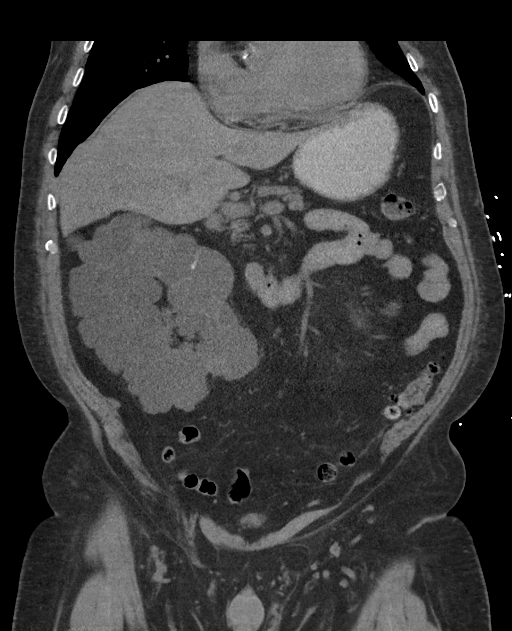
[im 62/112  soft-tissue]
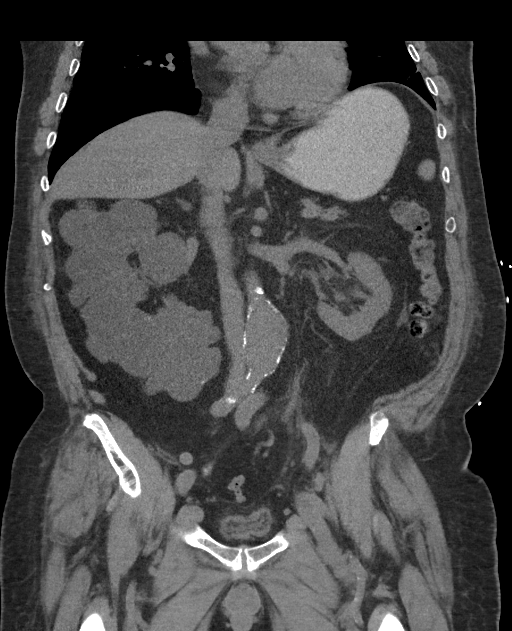

[15 of 46 positions shown; findings below may reference images not displayed]

FINDINGS: Lower chest: Linear atelectasis in the left lower lobe. No pleural
effusion. Coronary artery calcifications are seen.

Hepatobiliary: Decreased hepatic density consistent with steatosis.
Small subcentimeter hypodensity in the right lobe is unchanged but
incompletely characterized. Gallbladder is surgically absent, no
biliary dilatation.

Pancreas: Parenchymal atrophy. No ductal dilatation or inflammation.

Spleen: Normal in size without focal abnormality.

Adrenals/Urinary Tract: Normal adrenal glands. Enlargement and
cystic replacement of the right kidney with innumerable cysts of
varying sizes. Appearing density and some with thin calcifications.
Overall appearance is unchanged from prior exam. The right ureter is
nondilated. There is prominence of the left renal pelvis in ureter
with left perinephric edema. No urolithiasis. Exophytic cyst from
the lower left kidney is unchanged. Urinary bladder is completely
decompressed.

Stomach/Bowel: Stomach is physiologically distended without wall
thickening. No small bowel dilatation, inflammation or obstruction.
Normal appendix. Multifocal colonic diverticulosis throughout the
entire colon. Possible early pericolonic inflammation and wall
thickening at the distal descending colon. No perforation. No
abscess.

Vascular/Lymphatic: Aortic and branch atherosclerosis. Aneurysmal
dilatation of the infrarenal aorta maximal dimension 3.9 cm,
unchanged from prior exam. No periaortic soft tissue stranding to
suggest rupture. Enlarged right external iliac node measures 2.2 cm
series 2, image 83, unchanged from prior exam. Additional prominent
and enlarged pelvic and retroperitoneal nodes are also stable. No
progressive lymphadenopathy.

Reproductive: Normal sized prostate gland.

Other: Fat within both inguinal canals. Small fat containing
supraumbilical ventral abdominal wall hernia contains only fat. No
free air, free fluid, or intra-abdominal fluid collection.

Musculoskeletal: Again seen degenerative change in the spine.
Probable hemangioma within L1 vertebral body. Chronic deformity of
the right anterior iliac crest.
IMPRESSION: 1. Multifocal colonic diverticulosis. Possible early uncomplicated
acute diverticulitis in the distal descending colon.
2. Prominence of the left ureter and renal collecting system with
increased perinephric edema, suspicious for urinary tract infection.
No urolithiasis.
3. Polycystic disease of the right kidney, stable in noncontrast CT
appearance.
4. Aortic atherosclerosis with infrarenal abdominal aortic aneurysm
measuring 3.9 x 3.8 cm. Recommend followup by ultrasound in 2 years.
This recommendation follows ACR consensus guidelines: White Paper of
the ACR Incidental Findings Committee II on Vascular Findings. [HOSPITAL] 4190; [DATE].
5. Hepatic steatosis.
6. Stable enlarged pelvic and retroperitoneal nodes.

## 2017-11-07 NOTE — Patient Instructions (Signed)
Abdominal Aortic Aneurysm Blood pumps away from the heart through tubes (blood vessels) called arteries. Aneurysms are weak or damaged places in the wall of an artery. It bulges out like a balloon. An abdominal aortic aneurysm happens in the main artery of the body (aorta). It can burst or tear, causing bleeding inside the body. This is an emergency. It needs treatment right away. What are the causes? The exact cause is unknown. Things that could cause this problem include:  Fat and other substances building up in the lining of a tube.  Swelling of the walls of a blood vessel.  Certain tissue diseases.  Belly (abdominal) trauma.  An infection in the main artery of the body.  What increases the risk? There are things that make it more likely for you to have an aneurysm. These include:  Being over the age of 82 years old.  Having high blood pressure (hypertension).  Being a male.  Being white.  Being very overweight (obese).  Having a family history of aneurysm.  Using tobacco products.  What are the signs or symptoms? Symptoms depend on the size of the aneurysm and how fast it grows. There may not be symptoms. If symptoms occur, they can include:  Pain (belly, side, lower back, or groin).  Feeling full after eating a small amount of food.  Feeling sick to your stomach (nauseous), throwing up (vomiting), or both.  Feeling a lump in your belly that feels like it is beating (pulsating).  Feeling like you will pass out (faint).  How is this treated?  Medicine to control blood pressure and pain.  Imaging tests to see if the aneurysm gets bigger.  Surgery. How is this prevented? To lessen your chance of getting this condition:  Stop smoking. Stop chewing tobacco.  Limit or avoid alcohol.  Keep your blood pressure, blood sugar, and cholesterol within normal limits.  Eat less salt.  Eat foods low in saturated fats and cholesterol. These are found in animal and  whole dairy products.  Eat more fiber. Fiber is found in whole grains, vegetables, and fruits.  Keep a healthy weight.  Stay active and exercise often.  This information is not intended to replace advice given to you by your health care provider. Make sure you discuss any questions you have with your health care provider. Document Released: 01/21/2013 Document Revised: 03/03/2016 Document Reviewed: 10/26/2012 Elsevier Interactive Patient Education  2017 [employer]vier Inc.  

## 2017-11-07 NOTE — Assessment & Plan Note (Signed)
His duplex today shows stable 3.8 x 3.9 cm infrarenal abdominal aortic aneurysm without significant growth.  No surgery or intervention at this time. The patient has an asymptomatic abdominal aortic aneurysm that is less than 4 cm in maximal diameter.  I have discussed the natural history of abdominal aortic aneurysm and the small risk of rupture for aneurysm less than 5 cm in size.  However, as these small aneurysms tend to enlarge over time, continued surveillance with ultrasound or CT scan is mandatory.  I have also discussed optimizing medical management with hypertension and lipid control and the importance of abstinence from tobacco.  The patient is also encouraged to exercise a minimum of 30 minutes 4 times a week.  Should the patient develop new onset abdominal or back pain or signs of peripheral embolization they are instructed to seek medical attention immediately and to alert the physician providing care that they have an aneurysm.  The patient voices their understanding. The patient will return in 12 months with an aortic duplex.

## 2017-11-07 NOTE — Progress Notes (Signed)
MRN : [id number]  Patrick Payne Patrick Payne. is a 84 y.o. (1933-10-23) male who presents with chief complaint of  Chief Complaint  Patient presents with  . AAA    1 year f/u and u/s  .  History of Present Illness: Patient returns today in follow up of his aneurysm.  He denies any current aneurysm related symptoms. Specifically, the patient denies new back or abdominal pain, or signs of peripheral embolization.  He did have an episode of renal failure requiring urologic intervention about 6-8 months ago.  He had CT scans at that time which demonstrated 3.8-3.9 cm infrarenal abdominal aortic aneurysms.  I have reviewed the scans.  His duplex today shows stable 3.8 x 3.9 cm infrarenal abdominal aortic aneurysm without significant growth.  Current Outpatient Medications  Medication Sig Dispense Refill  . Aflibercept (EYLEA) 2 MG/0.05ML SOLN Inject 1 Dose into the eye as directed. One injection into left eye every 8-9 weeks    . allopurinol (ZYLOPRIM) 100 MG tablet Take 100 mg by mouth daily.    Marland Kitchen atorvastatin (LIPITOR) 10 MG tablet Take 10 mg by mouth at bedtime.     . calcipotriene-betamethasone (TACLONEX) ointment Apply 1 application topically as needed (psoriasis).    . carvedilol (COREG) 12.5 MG tablet Take 12.5 mg by mouth 2 (two) times daily with a meal.    . Cholecalciferol (VITAMIN D-3) 5000 units TABS Take 5,000 Units by mouth at bedtime.     . clonazePAM (KLONOPIN) 1 MG tablet Take 1 mg by mouth at bedtime.     . cyanocobalamin 500 MCG tablet Take 500 mcg by mouth at bedtime.    . fexofenadine (ALLEGRA) 180 MG tablet Take 180 mg by mouth daily as needed for allergies.     . finasteride (PROSCAR) 5 MG tablet Take 5 mg by mouth daily.    Marland Kitchen ibuprofen (ADVIL,MOTRIN) 200 MG tablet Take 400 mg by mouth every 6 (six) hours as needed for mild pain.    Marland Kitchen insulin glargine (LANTUS) 100 UNIT/ML injection Inject 26 Units into the skin daily.     Marland Kitchen LORazepam (ATIVAN) 1 MG tablet Take 1 mg by mouth  every 8 (eight) hours as needed for anxiety (takes 1 tablet every morning.Rarely has to take again during the day).     . Multiple Vitamins-Minerals (PRESERVISION AREDS 2 PO) Take 1 capsule by mouth 2 (two) times daily.     . Omega-3 Fatty Acids (FISH OIL PO) Take 2 capsules by mouth every evening.    . tamsulosin (FLOMAX) 0.4 MG CAPS capsule Take 0.4 mg by mouth daily after supper.     . triamcinolone (NASACORT) 55 MCG/ACT AERO nasal inhaler Place 2 sprays into the nose 2 (two) times daily as needed (allergies).     Marland Kitchen atenolol (TENORMIN) 50 MG tablet Take 50 mg by mouth daily.      No current facility-administered medications for this visit.     Past Medical History:  Diagnosis Date  . Anxiety   . CKD (chronic kidney disease), stage III ([place])   . CLL (chronic lymphocytic leukemia) ([place])   . Diabetes mellitus without complication ([place])   . History of kidney stones   . HLD (hyperlipidemia)   . HTN (hypertension)   . PKD (polycystic kidney disease)   . Sleep apnea     Past Surgical History:  Procedure Laterality Date  . CARDIAC CATHETERIZATION  12/1987  . CENTRAL LINE INSERTION N/A 04/11/2017   Procedure: [employer] Insertion;  Surgeon: Katha Cabal, MD;  Location: [place] [facility];  Service: Cardiovascular;  Laterality: N/A;  . CERVICAL FUSION    . CHOLECYSTECTOMY    . CYSTOSCOPY W/ RETROGRADES Left 05/12/2017   Procedure: CYSTOSCOPY WITH RETROGRADE PYELOGRAM;  Surgeon: Nickie Retort, MD;  Location: [facility];  Service: Urology;  Laterality: Left;  . CYSTOSCOPY W/ URETERAL STENT PLACEMENT Left 05/12/2017   Procedure: CYSTOSCOPY WITH STENT REMOVAL;  Surgeon: Nickie Retort, MD;  Location: [facility];  Service: Urology;  Laterality: Left;  . CYSTOSCOPY WITH STENT PLACEMENT Left 04/11/2017   Procedure: CYSTOSCOPY WITH STENT PLACEMENT;  Surgeon: Festus Aloe, MD;  Location: [facility];  Service: Urology;  Laterality: Left;  . URETEROSCOPY  05/12/2017   Procedure:  URETEROSCOPY;  Surgeon: Nickie Retort, MD;  Location: [facility];  Service: Urology;;     Social History       Social History  Substance Use Topics  . Smoking status: Former Research scientist (life sciences)  . Smokeless tobacco: Never Used     Comment: quit in Feb of 1997  . Alcohol use No      Family History      Family History  Problem Relation Age of Onset  . Cancer Mother   . Varicose Veins Mother   . Cancer Father           Allergies  Allergen Reactions  . Celecoxib Other (See Comments)    Increased Blood Pressure  . Chlorpromazine Nausea Only  . Meloxicam Other (See Comments)    Hypertension  . Prednisone Other (See Comments)    Diabetic  . Amoxicillin-Pot Clavulanate Rash  . Penicillin V Potassium Rash     REVIEW OF SYSTEMS (Negative unless checked)  Constitutional: [] Weight loss  [] Fever  [] Chills Cardiac: [] Chest pain   [] Chest pressure   [] Palpitations   [] Shortness of breath when laying flat   [] Shortness of breath at rest   [] Shortness of breath with exertion. Vascular:  [] Pain in legs with walking   [] Pain in legs at rest   [] Pain in legs when laying flat   [] Claudication   [] Pain in feet when walking  [] Pain in feet at rest  [] Pain in feet when laying flat   [] History of DVT   [] Phlebitis   [] Swelling in legs   [] Varicose veins   [] Non-healing ulcers Pulmonary:   [] Uses home oxygen   [] Productive cough   [] Hemoptysis   [] Wheeze  [] COPD   [] Asthma Neurologic:  [] Dizziness  [] Blackouts   [] Seizures   [] History of stroke   [] History of TIA  [] Aphasia   [] Temporary blindness   [] Dysphagia   [] Weakness or numbness in arms   [] Weakness or numbness in legs Musculoskeletal:  [x] Arthritis   [] Joint swelling   [x] Joint pain   [] Low back pain Hematologic:  [] Easy bruising  [] Easy bleeding   [] Hypercoagulable state   [] Anemic   Gastrointestinal:  [] Blood in stool   [] Vomiting blood  [] Gastroesophageal reflux/heartburn   [] Abdominal pain Genitourinary:  [] Chronic  kidney disease   [] Difficult urination  [] Frequent urination  [] Burning with urination   [] Hematuria Skin:  [] Rashes   [] Ulcers   [] Wounds Psychological:  [] History of anxiety   []  History of major depression.      Physical Examination  BP (!) 155/81 (BP Location: Right Arm, Patient Position: Sitting)   Pulse 64   Resp 15   Ht 5\' 6"  (1.676 m)   Wt 98.9 kg (218 lb)   BMI 35.19 kg/m  Gen:  WD/WN, NAD Head: [place]/AT, No temporalis wasting. Ear/Nose/Throat: Hearing grossly intact, nares w/o erythema or drainage, trachea midline Eyes: Conjunctiva clear. Sclera non-icteric Neck: Supple.  No JVD.  Pulmonary:  Good air movement, no use of accessory muscles.  Cardiac: RRR, normal S1, S2 Vascular:  Vessel Right Left  Radial Palpable Palpable                                   Gastrointestinal: soft, non-tender/non-distended. Aorta not palpable with body habitus Musculoskeletal: M/S 5/5 throughout.  No deformity or atrophy. Neurologic: Sensation grossly intact in extremities.  Symmetrical.  Speech is fluent.  Psychiatric: Judgment intact, Mood & affect appropriate for pt's clinical situation. Dermatologic: No rashes or ulcers noted.  No cellulitis or open wounds.       Labs No results found for this or any previous visit (from the past 2160 hour(s)).  Radiology No results found.   Assessment/Plan Diabetes mellitus ([place]) blood glucose control important in reducing the progression of atherosclerotic disease. Also, involved in wound healing. On appropriate medications.   BP (high blood pressure) blood pressure control important in reducing the progression of atherosclerotic disease and aneurysmal disease. On appropriate oral medications.   Abdominal aortic aneurysm (AAA) ([place]) His duplex today shows stable 3.8 x 3.9 cm infrarenal abdominal aortic aneurysm without significant growth.  No surgery or intervention at this time. The patient has an asymptomatic abdominal  aortic aneurysm that is less than 4 cm in maximal diameter.  I have discussed the natural history of abdominal aortic aneurysm and the small risk of rupture for aneurysm less than 5 cm in size.  However, as these small aneurysms tend to enlarge over time, continued surveillance with ultrasound or CT scan is mandatory.  I have also discussed optimizing medical management with hypertension and lipid control and the importance of abstinence from tobacco.  The patient is also encouraged to exercise a minimum of 30 minutes 4 times a week.  Should the patient develop new onset abdominal or back pain or signs of peripheral embolization they are instructed to seek medical attention immediately and to alert the physician providing care that they have an aneurysm.  The patient voices their understanding. The patient will return in 12 months with an aortic duplex.     Leotis Pain, MD  11/07/2017 10:47 AM    This note was created with Dragon medical transcription system.  Any errors from dictation are purely unintentional

## 2018-02-05 ENCOUNTER — Telehealth: Payer: Self-pay | Admitting: Urology

## 2018-02-05 ENCOUNTER — Other Ambulatory Visit: Payer: Self-pay

## 2018-02-05 MED ORDER — TAMSULOSIN HCL 0.4 MG PO CAPS: 0.4000 mg | ORAL_CAPSULE | Freq: Every day | ORAL | 1 refills | Status: DC

## 2018-02-05 MED ORDER — FINASTERIDE 5 MG PO TABS: 5.0000 mg | ORAL_TABLET | Freq: Every day | ORAL | 1 refills | Status: DC

## 2018-02-05 NOTE — Telephone Encounter (Signed)
OK to refill the two meds

## 2018-02-05 NOTE — Telephone Encounter (Signed)
Pt needs refills on Finasteride 5 mg and Tamsulosin 0.4 mg.  Pt uses [facility] on [address].

## 2018-02-05 NOTE — Telephone Encounter (Signed)
Patient wanted to make sure he got 90 day supplies for his refills

## 2018-02-05 NOTE — Telephone Encounter (Signed)
Pt is requesting refills on Finasteride 5mg  and Tamsulosin 0.4mg , 90 days for both. Pt was already on this when he came and saw you in 06/2017. Is it okay to refill for pt?

## 2018-02-05 NOTE — Telephone Encounter (Signed)
rx sent to pharmacy

## 2018-03-09 IMAGING — US US RENAL
1 series · 14 of 25 positions shown · non-contrast
Comparison: 04/11/2017 ultrasound, 04/09/2017 CT

CLINICAL DATA: Hydronephrosis, polycystic kidney disease,
hypertension, diabetes mellitus

EXAM:
RENAL / URINARY TRACT ULTRASOUND COMPLETE

[Series 1: us renal · 0.26mm/px · 14 of 49 slices shown]
[im 1/49]
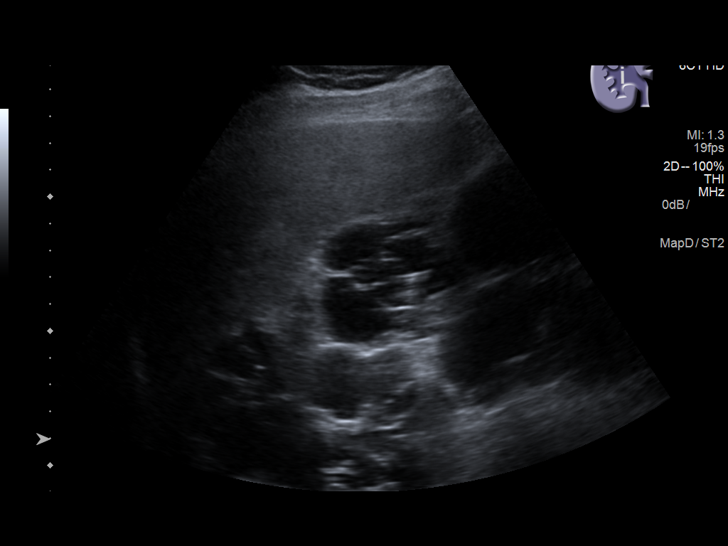
[im 5/49]
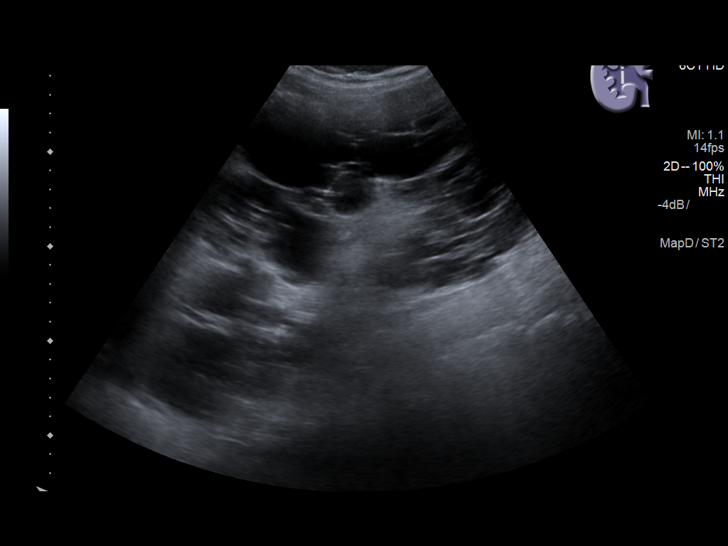
[im 9/49]
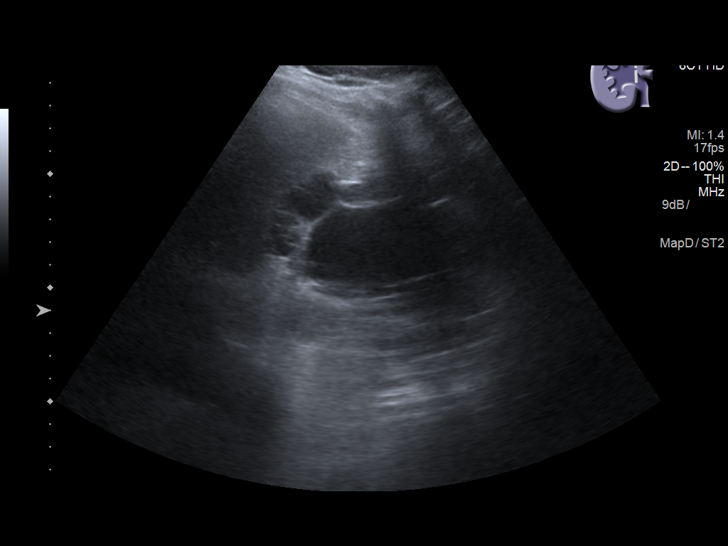
[im 13/49]
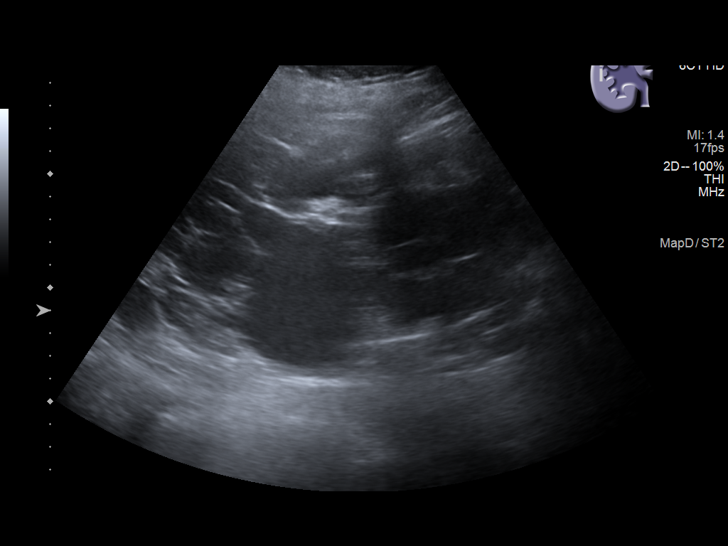
[im 17/49]
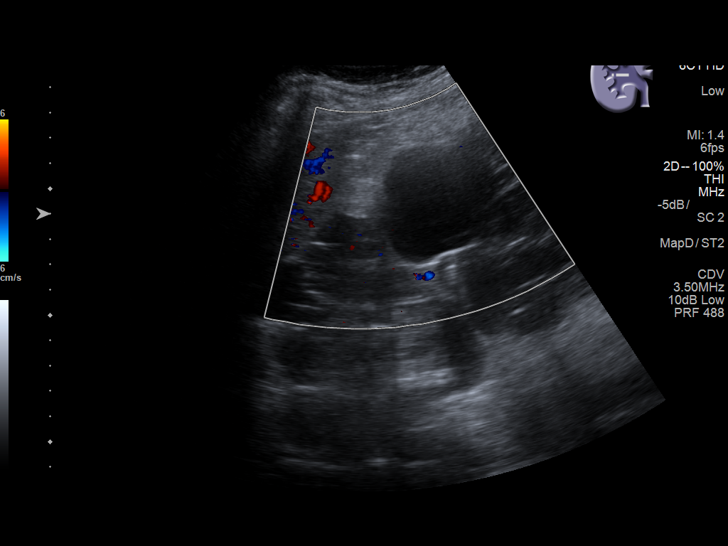
[im 19/49]
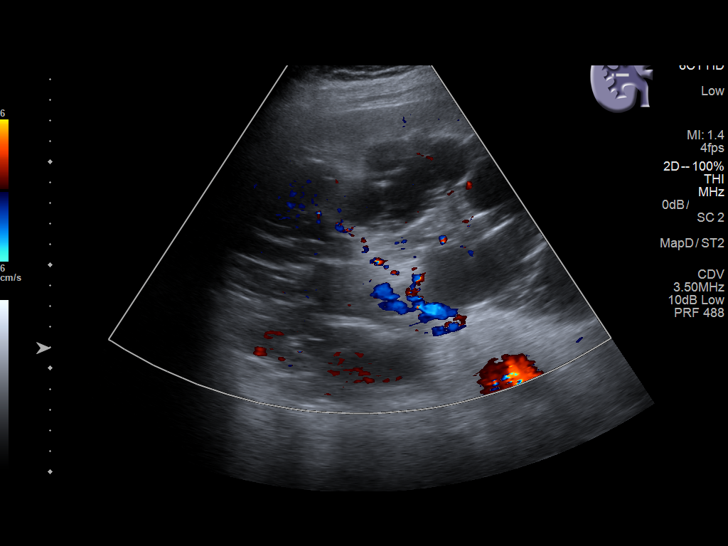
[im 23/49]
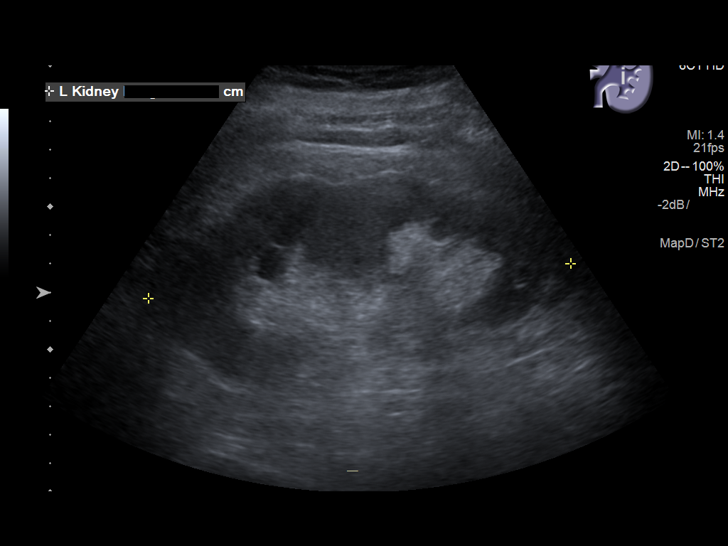
[im 27/49]
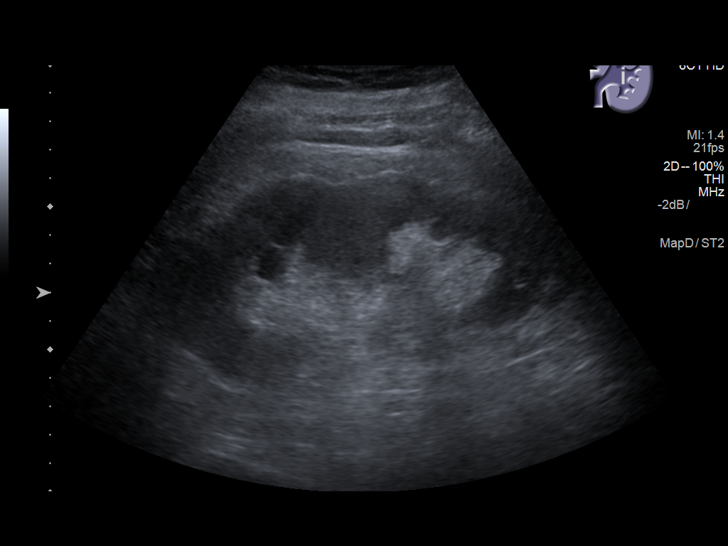
[im 31/49]
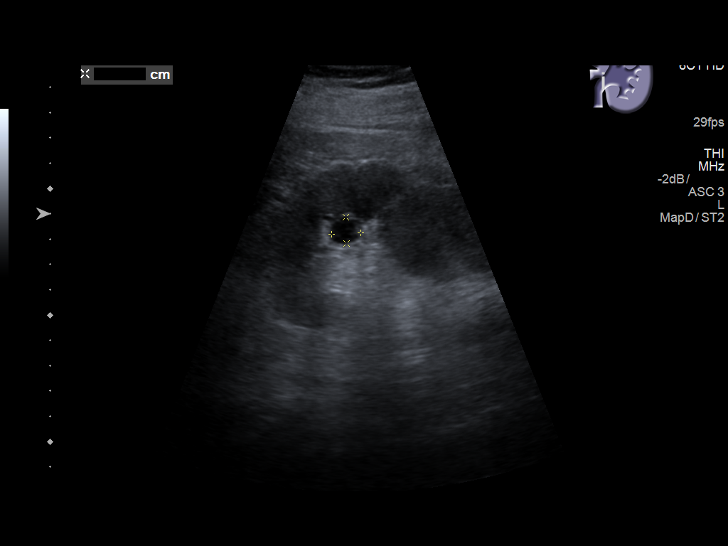
[im 33/49]
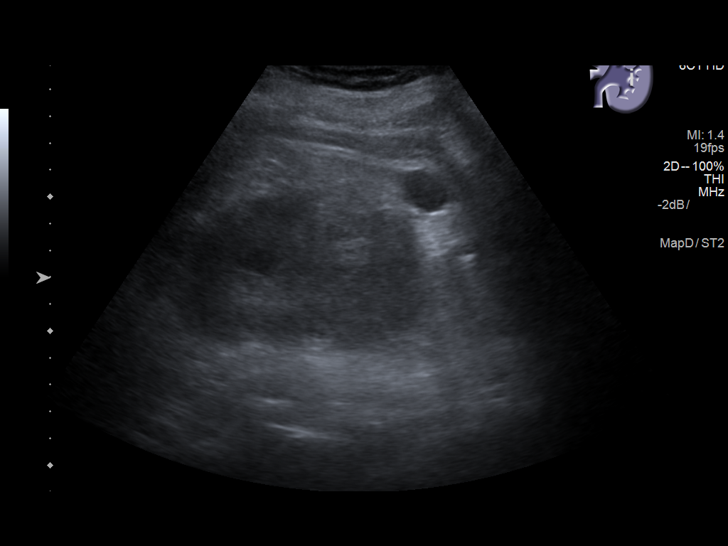
[im 37/49]
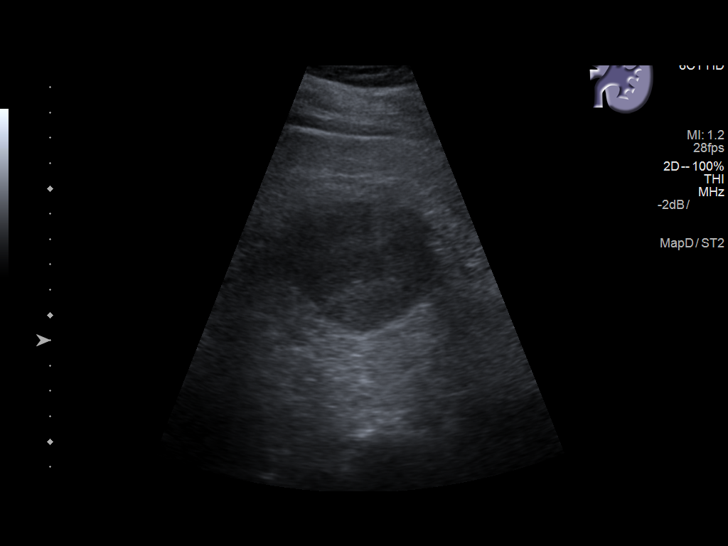
[im 41/49]
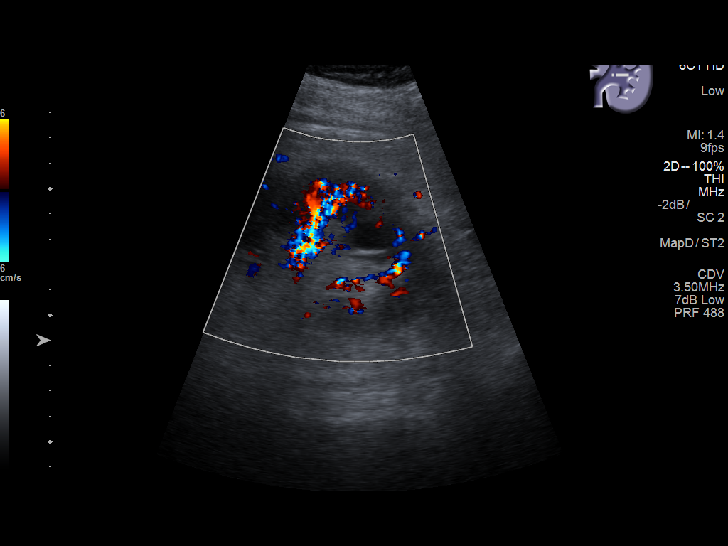
[im 45/49]
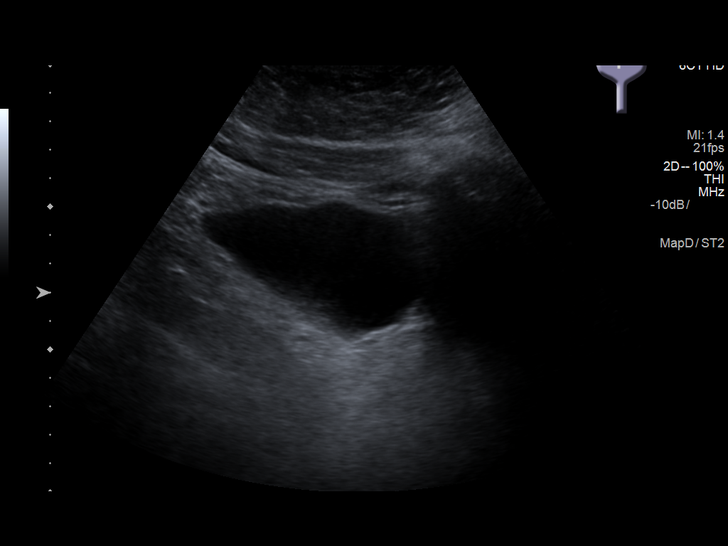
[im 49/49]
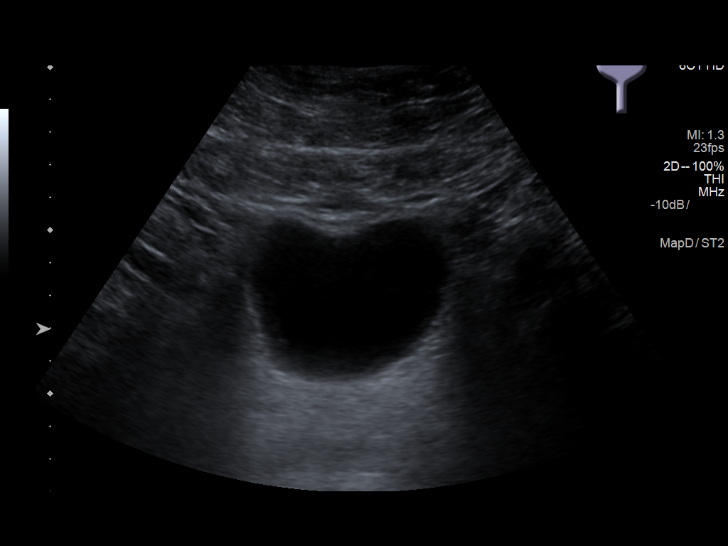

[14 of 25 positions shown; findings below may reference images not displayed]

FINDINGS: Right Kidney:

Length: Markedly enlarged, question 25.7 cm length.. Numerous cysts
throughout RIGHT kidney consistent with polycystic disease as noted
on prior CT and ultrasound exams. Largest cyst upper pole 5.5 cm
greatest size. No definite solid mass or collecting system
dilatation.

Left Kidney:

Length: 14.9 cm. Normal cortical thickness. Slightly increased
cortical echogenicity. Small cyst upper pole is 12 x 10 x 15 mm.
Additional small exophytic cyst at lower pole 18 x 12 x 17 mm. No
definite solid mass, shadowing calcification or hydronephrosis.

Bladder:

Only partially distended, grossly normal appearance. BILATERAL
ureteral jets seen.
IMPRESSION: Markedly enlarged polycystic RIGHT kidney.

Small LEFT renal cyst.

No gross evidence of solid mass or hydronephrosis.

## 2018-03-28 ENCOUNTER — Other Ambulatory Visit: Payer: Medicare Other

## 2018-03-28 ENCOUNTER — Ambulatory Visit: Payer: Medicare Other | Admitting: Oncology

## 2018-04-02 NOTE — Progress Notes (Signed)
[facility]  [phone] [phone]  ID: Shelda Altes Sowash Jr. OB: 07/08/1934  MR#: [id number]  BTD#:[id number]  Patient Care Team: Idelle Crouch, MD as PCP - General (Internal Medicine)  CHIEF COMPLAINT: CLL  INTERVAL HISTORY: Patient returns to clinic today for his routine yearly follow-up and laboratory work.  He was hospitalized several months ago for acute renal failure of unknown etiology which required several days of dialysis.  This is now resolved and patient is back to his baseline.  He currently feels well and is asymptomatic.  He denies any fevers, night sweats, or weight loss. He has noted no new lymphadenopathy. He has no neurologic complaints. He denies any chest pain or shortness of breath. He denies any nausea, vomiting, constipation, or diarrhea. He has no urinary complaints.  Patient offers no specific complaints today.  REVIEW OF SYSTEMS:   Review of Systems  Constitutional: Negative.  Negative for diaphoresis, fever, malaise/fatigue and weight loss.  Respiratory: Negative.  Negative for cough and shortness of breath.   Cardiovascular: Negative.  Negative for chest pain and leg swelling.  Gastrointestinal: Negative.  Negative for abdominal pain, blood in stool and melena.  Genitourinary: Negative.  Negative for dysuria.  Musculoskeletal: Negative.  Negative for back pain.  Skin: Negative.  Negative for rash.  Neurological: Negative.  Negative for sensory change, focal weakness and weakness.  Psychiatric/Behavioral: Negative.  The patient is not nervous/anxious.     As per HPI. Otherwise, a complete review of systems is negative.  PAST MEDICAL HISTORY: Past Medical History:  Diagnosis Date  . Anxiety   . CKD (chronic kidney disease), stage III ([place])   . CLL (chronic lymphocytic leukemia) ([place])   . Diabetes mellitus without complication ([place])   . History of kidney stones   . HLD (hyperlipidemia)   . HTN (hypertension)    . PKD (polycystic kidney disease)   . Sleep apnea     PAST SURGICAL HISTORY: Past Surgical History:  Procedure Laterality Date  . CARDIAC CATHETERIZATION  12/1987  . CENTRAL LINE INSERTION N/A 04/11/2017   Procedure: Central Line Insertion;  Surgeon: Delana Meyer Dolores Lory, MD;  Location: [place] [facility];  Service: Cardiovascular;  Laterality: N/A;  . CERVICAL FUSION    . CHOLECYSTECTOMY    . CYSTOSCOPY W/ RETROGRADES Left 05/12/2017   Procedure: CYSTOSCOPY WITH RETROGRADE PYELOGRAM;  Surgeon: Nickie Retort, MD;  Location: [facility];  Service: Urology;  Laterality: Left;  . CYSTOSCOPY W/ URETERAL STENT PLACEMENT Left 05/12/2017   Procedure: CYSTOSCOPY WITH STENT REMOVAL;  Surgeon: Nickie Retort, MD;  Location: [facility];  Service: Urology;  Laterality: Left;  . CYSTOSCOPY WITH STENT PLACEMENT Left 04/11/2017   Procedure: CYSTOSCOPY WITH STENT PLACEMENT;  Surgeon: Festus Aloe, MD;  Location: [facility];  Service: Urology;  Laterality: Left;  . URETEROSCOPY  05/12/2017   Procedure: URETEROSCOPY;  Surgeon: Nickie Retort, MD;  Location: [facility];  Service: Urology;;    FAMILY HISTORY: Reported history of ovarian and lung cancer. Diabetes, hypertension.     ADVANCED DIRECTIVES:    HEALTH MAINTENANCE: Social History   Tobacco Use  . Smoking status: Former Research scientist (life sciences)  . Smokeless tobacco: Never Used  . Tobacco comment: quit in Feb of 1997  Substance Use Topics  . Alcohol use: No  . Drug use: No     Colonoscopy:  PAP:  Bone density:  Lipid panel:  Allergies  Allergen Reactions  . Celecoxib Other (See Comments)    Increased  Blood Pressure  . Chlorpromazine Nausea Only  . Prednisone Other (See Comments)    Diabetic  . Amoxicillin-Pot Clavulanate Rash    Has patient had a PCN reaction causing immediate rash, facial/tongue/throat swelling, SOB or lightheadedness with hypotension: No Has patient had a PCN reaction causing severe rash involving mucus membranes or skin  necrosis: No Has patient had a PCN reaction that required hospitalization: No Has patient had a PCN reaction occurring within the last 10 years: Yes If all of the above answers are "NO", then may proceed with Cephalosporin use.   Marland Kitchen Penicillin V Potassium Rash    Current Outpatient Medications  Medication Sig Dispense Refill  . Aflibercept (EYLEA) 2 MG/0.05ML SOLN Inject 1 Dose into the eye as directed. One injection into left eye every 8-9 weeks    . allopurinol (ZYLOPRIM) 100 MG tablet Take 100 mg by mouth daily.    Marland Kitchen atenolol (TENORMIN) 50 MG tablet Take 50 mg by mouth daily.     Marland Kitchen atorvastatin (LIPITOR) 10 MG tablet Take 10 mg by mouth at bedtime.     . calcipotriene-betamethasone (TACLONEX) ointment Apply 1 application topically as needed (psoriasis).    . carvedilol (COREG) 12.5 MG tablet Take 12.5 mg by mouth 2 (two) times daily with a meal.    . Cholecalciferol (VITAMIN D-3) 5000 units TABS Take 5,000 Units by mouth at bedtime.     . clonazePAM (KLONOPIN) 1 MG tablet Take 1 mg by mouth at bedtime.     . cyanocobalamin 500 MCG tablet Take 500 mcg by mouth at bedtime.    . fexofenadine (ALLEGRA) 180 MG tablet Take 180 mg by mouth daily as needed for allergies.     . finasteride (PROSCAR) 5 MG tablet Take 1 tablet (5 mg total) by mouth daily. 90 tablet 1  . ibuprofen (ADVIL,MOTRIN) 200 MG tablet Take 400 mg by mouth every 6 (six) hours as needed for mild pain.    Marland Kitchen insulin glargine (LANTUS) 100 UNIT/ML injection Inject 26 Units into the skin daily.     Marland Kitchen LORazepam (ATIVAN) 1 MG tablet Take 1 mg by mouth every 8 (eight) hours as needed for anxiety (takes 1 tablet every morning.Rarely has to take again during the day).     . Multiple Vitamins-Minerals (PRESERVISION AREDS 2 PO) Take 1 capsule by mouth 2 (two) times daily.     . Omega-3 Fatty Acids (FISH OIL PO) Take 2 capsules by mouth every evening.    . tamsulosin (FLOMAX) 0.4 MG CAPS capsule Take 1 capsule (0.4 mg total) by mouth  daily after supper. 90 capsule 1  . triamcinolone (NASACORT) 55 MCG/ACT AERO nasal inhaler Place 2 sprays into the nose 2 (two) times daily as needed (allergies).      No current facility-administered medications for this visit.     OBJECTIVE: Vitals:   04/04/18 1404 04/04/18 1420  BP:  (!) 157/84  Pulse:  69  Resp: 16   Temp:  (!) 97.5 F (36.4 C)     Body mass index is 33.54 kg/m.    ECOG FS:0 - Asymptomatic  General: Well-developed, well-nourished, no acute distress. Eyes: Pink conjunctiva, anicteric sclera. Lungs: Clear to auscultation bilaterally. Heart: Regular rate and rhythm. No rubs, murmurs, or gallops. Abdomen: Soft, nontender, nondistended. No organomegaly noted, normoactive bowel sounds. Musculoskeletal: No edema, cyanosis, or clubbing. Neuro: Alert, answering all questions appropriately. Cranial nerves grossly intact. Skin: No rashes or petechiae noted. Psych: Normal affect.   LAB RESULTS:  Lab Results  Component Value Date   NA 138 06/09/2017   K 4.5 06/09/2017   CL 104 06/09/2017   CO2 21 06/09/2017   GLUCOSE 159 (H) 06/09/2017   BUN 26 06/09/2017   CREATININE 1.44 (H) 06/09/2017   CALCIUM 8.6 06/09/2017   PROT 6.1 (L) 04/09/2017   ALBUMIN 2.5 (L) 04/17/2017   AST 22 04/09/2017   ALT 19 04/09/2017   ALKPHOS 76 04/09/2017   BILITOT 0.9 04/09/2017   GFRNONAA 46 (L) 06/09/2017   GFRAA 53 (L) 06/09/2017    Lab Results  Component Value Date   WBC 15.7 (H) 04/04/2018   NEUTROABS 2.6 04/04/2018   HGB 11.9 (L) 04/04/2018   HCT 34.9 (L) 04/04/2018   MCV 97.6 04/04/2018   PLT 63 (L) 04/04/2018     STUDIES: No results found.  ASSESSMENT: Rai stage I CLL.  PLAN:    1. CLL: Patient's white count remains mildly elevated with a lymphocyte predominance, but essentially stable and unchanged for greater than 10 years.  He has a persistent thrombocytopenia which is also essentially unchanged.  Continue simple observation.  Patient CBC is monitored  several times per year by his primary care physician, therefore he can return to clinic in 1 year for further evaluation.   2. Thrombocytopenia: Patient platelet count remains decreased at 66, but this is essentially unchanged for greater than 10 years.  No intervention is needed.  Patient does not require bone marrow biopsy.   3. Chronic renal insufficiency: Patient had an episode of acute renal failure several months ago, continue monitoring and treatment per nephrology.    4. Lymphadenopathy: CT scan results from April 09, 2017 reviewed independently with enlarged pelvic and retroperitoneal lymph nodes. These are also noted on a scan in 2012 appear to be slightly larger. No intervention is needed. These are likely secondary to patient's underlying CLL. Return to clinic in 1 year as above.  Patient expressed understanding and was in agreement with this plan. He also understands that He can call clinic at any time with any questions, concerns, or complaints.   Lloyd Huger, MD   04/08/2018 8:25 AM

## 2018-04-04 ENCOUNTER — Inpatient Hospital Stay: Payer: Medicare Other | Admitting: Oncology

## 2018-04-04 ENCOUNTER — Other Ambulatory Visit: Payer: Self-pay

## 2018-04-04 ENCOUNTER — Inpatient Hospital Stay: Payer: Medicare Other | Attending: Oncology

## 2018-04-04 ENCOUNTER — Encounter: Payer: Self-pay | Admitting: Oncology

## 2018-04-04 ENCOUNTER — Other Ambulatory Visit: Payer: Self-pay | Admitting: *Deleted

## 2018-04-04 VITALS — BP 157/84 | HR 69 | Temp 97.5°F | Resp 16 | Ht 68.0 in | Wt 220.6 lb

## 2018-04-04 DIAGNOSIS — I129 Hypertensive chronic kidney disease with stage 1 through stage 4 chronic kidney disease, or unspecified chronic kidney disease: Secondary | ICD-10-CM | POA: Insufficient documentation

## 2018-04-04 DIAGNOSIS — N183 Chronic kidney disease, stage 3 (moderate): Secondary | ICD-10-CM | POA: Insufficient documentation

## 2018-04-04 DIAGNOSIS — C9110 Chronic lymphocytic leukemia of B-cell type not having achieved remission: Secondary | ICD-10-CM | POA: Insufficient documentation

## 2018-04-04 DIAGNOSIS — E785 Hyperlipidemia, unspecified: Secondary | ICD-10-CM | POA: Diagnosis not present

## 2018-04-04 DIAGNOSIS — G4730 Sleep apnea, unspecified: Secondary | ICD-10-CM | POA: Insufficient documentation

## 2018-04-04 DIAGNOSIS — Z801 Family history of malignant neoplasm of trachea, bronchus and lung: Secondary | ICD-10-CM

## 2018-04-04 DIAGNOSIS — Q613 Polycystic kidney, unspecified: Secondary | ICD-10-CM | POA: Diagnosis not present

## 2018-04-04 DIAGNOSIS — E1122 Type 2 diabetes mellitus with diabetic chronic kidney disease: Secondary | ICD-10-CM

## 2018-04-04 DIAGNOSIS — F419 Anxiety disorder, unspecified: Secondary | ICD-10-CM | POA: Insufficient documentation

## 2018-04-04 DIAGNOSIS — Z79899 Other long term (current) drug therapy: Secondary | ICD-10-CM

## 2018-04-04 DIAGNOSIS — Z87891 Personal history of nicotine dependence: Secondary | ICD-10-CM

## 2018-04-04 DIAGNOSIS — Z8041 Family history of malignant neoplasm of ovary: Secondary | ICD-10-CM | POA: Diagnosis not present

## 2018-04-04 DIAGNOSIS — Z87442 Personal history of urinary calculi: Secondary | ICD-10-CM

## 2018-04-04 LAB — CBC WITH DIFFERENTIAL/PLATELET
Basophils Absolute: 0 10*3/uL (ref 0–0.1)
Basophils Relative: 0 %
EOS ABS: 0.1 10*3/uL (ref 0–0.7)
EOS PCT: 1 %
HCT: 34.9 % — ABNORMAL LOW (ref 40.0–52.0)
Hemoglobin: 11.9 g/dL — ABNORMAL LOW (ref 13.0–18.0)
LYMPHS ABS: 12.5 10*3/uL — AB (ref 1.0–3.6)
LYMPHS PCT: 79 %
MCH: 33.1 pg (ref 26.0–34.0)
MCHC: 34 g/dL (ref 32.0–36.0)
MCV: 97.6 fL (ref 80.0–100.0)
MONOS PCT: 3 %
Monocytes Absolute: 0.4 10*3/uL (ref 0.2–1.0)
NEUTROS PCT: 17 %
Neutro Abs: 2.6 10*3/uL (ref 1.4–6.5)
PLATELETS: 63 10*3/uL — AB (ref 150–440)
RBC: 3.58 MIL/uL — ABNORMAL LOW (ref 4.40–5.90)
RDW: 14.4 % (ref 11.5–14.5)
WBC: 15.7 10*3/uL — ABNORMAL HIGH (ref 3.8–10.6)

## 2018-04-04 NOTE — Progress Notes (Signed)
Patient here for follow up. No concerns today. Patient was hospitalized for acute renal failure.

## 2018-04-10 ENCOUNTER — Encounter: Payer: Self-pay | Admitting: Oncology

## 2018-06-14 ENCOUNTER — Ambulatory Visit: Payer: Medicare Other | Admitting: Urology

## 2018-06-14 ENCOUNTER — Encounter: Payer: Self-pay | Admitting: Urology

## 2018-06-14 VITALS — BP 157/81 | HR 73 | Ht 68.0 in | Wt 223.0 lb

## 2018-06-14 DIAGNOSIS — N401 Enlarged prostate with lower urinary tract symptoms: Secondary | ICD-10-CM | POA: Diagnosis not present

## 2018-06-14 MED ORDER — TAMSULOSIN HCL 0.4 MG PO CAPS: 0.4000 mg | ORAL_CAPSULE | Freq: Every day | ORAL | 1 refills | Status: DC

## 2018-06-14 MED ORDER — FINASTERIDE 5 MG PO TABS: 5.0000 mg | ORAL_TABLET | Freq: Every day | ORAL | 1 refills | Status: DC

## 2018-06-14 NOTE — Progress Notes (Signed)
   06/14/2018 2:33 PM   Patrick Altes Howes Jr. 1934/01/06 [id number]  Reason for visit: Follow up LUTS  HPI: I had the pleasure of seeing [name] in urology clinic today.  He is an 84 year old male with interesting urologic history.  He had acute onset of renal failure in July 2018 and was found to have left hydronephrosis and a functionally solitary left kidney.  He underwent ureteral stent placement which improved his renal function, and ultimately underwent negative ureteroscopy and hydronephrosis resolved.  Follow-up ultrasound 06/09/2017 showed resolution of his hydronephrosis after stent removal.  Since this he has been doing very well and denies any episodes of flank pain.  His creatinine is stable at 1.5.  Regarding his lower urinary tract symptoms, he has very minimal urinary complaints and is currently well managed on maximal medical therapy with finasteride and Flomax.   ROS: Please see flowsheet from today's date for complete review of systems.  Physical Exam: BP (!) 157/81 (BP Location: Left Arm, Patient Position: Sitting, Cuff Size: Normal)   Pulse 73   Ht 5\' 8"  (1.727 m)   Wt 223 lb (101.2 kg)   BMI 33.91 kg/m    Constitutional:  Alert and oriented, No acute distress. Respiratory: Normal respiratory effort, no increased work of breathing. GI: Abdomen is soft, nontender, nondistended, no abdominal masses GU: No CVA tenderness Skin: No rashes, bruises or suspicious lesions. Neurologic: Grossly intact, no focal deficits, moving all 4 extremities. Psychiatric: Normal mood and affect   Assessment & Plan:   In summary, [name] is an 84 year old male with history of left hydronephrosis of unclear etiology and functionally solitary left kidney since resolved after stent placement and negative ureteroscopy and stent removal.  Follow-up ultrasound confirmed resolution of his hydronephrosis and renal function is been stable.  His urinary symptoms are very well  managed on Flomax and finasteride.  1. Refill flomax/finasteride 2. RTC 1 year IPSS/PVR   Return in about 1 year (around 06/15/2019) for PVR/IPSS.  [name], [facility] [address] [place], [place] [address] [phone]

## 2018-06-28 DIAGNOSIS — E1142 Type 2 diabetes mellitus with diabetic polyneuropathy: Secondary | ICD-10-CM | POA: Insufficient documentation

## 2018-06-28 DIAGNOSIS — E1165 Type 2 diabetes mellitus with hyperglycemia: Secondary | ICD-10-CM

## 2018-06-28 HISTORY — DX: Type 2 diabetes mellitus with hyperglycemia: E11.65

## 2018-07-03 DIAGNOSIS — F411 Generalized anxiety disorder: Secondary | ICD-10-CM | POA: Insufficient documentation

## 2018-11-06 ENCOUNTER — Ambulatory Visit (INDEPENDENT_AMBULATORY_CARE_PROVIDER_SITE_OTHER): Payer: Medicare Other | Admitting: Vascular Surgery

## 2018-11-06 ENCOUNTER — Ambulatory Visit (INDEPENDENT_AMBULATORY_CARE_PROVIDER_SITE_OTHER): Payer: Medicare Other

## 2018-11-06 ENCOUNTER — Encounter (INDEPENDENT_AMBULATORY_CARE_PROVIDER_SITE_OTHER): Payer: Self-pay | Admitting: Vascular Surgery

## 2018-11-06 VITALS — BP 136/76 | HR 62 | Resp 16 | Ht 68.0 in | Wt 216.8 lb

## 2018-11-06 DIAGNOSIS — E119 Type 2 diabetes mellitus without complications: Secondary | ICD-10-CM | POA: Diagnosis not present

## 2018-11-06 DIAGNOSIS — I10 Essential (primary) hypertension: Secondary | ICD-10-CM

## 2018-11-06 DIAGNOSIS — E785 Hyperlipidemia, unspecified: Secondary | ICD-10-CM | POA: Diagnosis not present

## 2018-11-06 DIAGNOSIS — I714 Abdominal aortic aneurysm, without rupture, unspecified: Secondary | ICD-10-CM

## 2018-11-06 DIAGNOSIS — Z87891 Personal history of nicotine dependence: Secondary | ICD-10-CM

## 2018-11-06 NOTE — Progress Notes (Signed)
MRN : [id number]  Patrick Payne. is a 85 y.o. ([birth year]) male who presents with chief complaint of  Chief Complaint  Patient presents with  . Follow-up  .  History of Present Illness: Patient returns today in follow up of his AAA.  The patient is doing well with no major changes or complaints since his last visit.  No aneurysm related symptoms today. Specifically, the patient denies new back or abdominal pain, or signs of peripheral embolization. his aortic duplex today shows no change in his abdominal aortic aneurysm measuring 3.95 cm x 3.82 cm in maximal diameter.  Current Outpatient Medications  Medication Sig Dispense Refill  . Aflibercept (EYLEA) 2 MG/0.05ML SOLN Inject 1 Dose into the eye as directed. One injection into left eye every 8-9 weeks    . allopurinol (ZYLOPRIM) 100 MG tablet Take 100 mg by mouth daily.    Marland Kitchen atorvastatin (LIPITOR) 10 MG tablet Take 10 mg by mouth at bedtime.     . calcipotriene-betamethasone (TACLONEX) ointment Apply 1 application topically as needed (psoriasis).    . carvedilol (COREG) 12.5 MG tablet Take 12.5 mg by mouth 2 (two) times daily with a meal.    . Cholecalciferol (VITAMIN D-3) 5000 units TABS Take 5,000 Units by mouth at bedtime.     . clonazePAM (KLONOPIN) 1 MG tablet Take 1 mg by mouth at bedtime.     . cyanocobalamin 500 MCG tablet Take 1,000 mcg by mouth at bedtime.     . fexofenadine (ALLEGRA) 180 MG tablet Take 180 mg by mouth daily as needed for allergies.     . finasteride (PROSCAR) 5 MG tablet Take 1 tablet (5 mg total) by mouth daily. 90 tablet 1  . insulin glargine (LANTUS) 100 UNIT/ML injection Inject 45 Units into the skin daily.     Marland Kitchen LORazepam (ATIVAN) 1 MG tablet Take 1 mg by mouth every 8 (eight) hours as needed for anxiety (takes 1 tablet every morning.Rarely has to take again during the day).     . metFORMIN (GLUCOPHAGE) 500 MG tablet Take by mouth 2 (two) times daily with a meal.    . Multiple  Vitamins-Minerals (PRESERVISION AREDS 2 PO) Take 1 capsule by mouth 2 (two) times daily.     Marland Kitchen omega-3 acid ethyl esters (LOVAZA) 1 g capsule Take by mouth 2 (two) times daily.    . tamsulosin (FLOMAX) 0.4 MG CAPS capsule Take 1 capsule (0.4 mg total) by mouth daily after supper. 90 capsule 1  . triamcinolone (NASACORT) 55 MCG/ACT AERO nasal inhaler Place 2 sprays into the nose 2 (two) times daily as needed (allergies).     Marland Kitchen ibuprofen (ADVIL,MOTRIN) 200 MG tablet Take 400 mg by mouth every 6 (six) hours as needed for mild pain.     No current facility-administered medications for this visit.     Past Medical History:  Diagnosis Date  . Anxiety   . CKD (chronic kidney disease), stage III ([place])   . CLL (chronic lymphocytic leukemia) ([place])   . Diabetes mellitus without complication ([place])   . History of kidney stones   . HLD (hyperlipidemia)   . HTN (hypertension)   . PKD (polycystic kidney disease)   . Sleep apnea     Past Surgical History:  Procedure Laterality Date  . CARDIAC CATHETERIZATION  12/1987  . CENTRAL LINE INSERTION N/A 04/11/2017   Procedure: Central Line Insertion;  Surgeon: Delana Meyer Dolores Lory, MD;  Location: [facility];  Service: Cardiovascular;  Laterality: N/A;  . CERVICAL FUSION    . CHOLECYSTECTOMY    . CYSTOSCOPY W/ RETROGRADES Left 05/12/2017   Procedure: CYSTOSCOPY WITH RETROGRADE PYELOGRAM;  Surgeon: Nickie Retort, MD;  Location: [facility];  Service: Urology;  Laterality: Left;  . CYSTOSCOPY W/ URETERAL STENT PLACEMENT Left 05/12/2017   Procedure: CYSTOSCOPY WITH STENT REMOVAL;  Surgeon: Nickie Retort, MD;  Location: [facility];  Service: Urology;  Laterality: Left;  . CYSTOSCOPY WITH STENT PLACEMENT Left 04/11/2017   Procedure: CYSTOSCOPY WITH STENT PLACEMENT;  Surgeon: Festus Aloe, MD;  Location: [facility];  Service: Urology;  Laterality: Left;  . URETEROSCOPY  05/12/2017   Procedure: URETEROSCOPY;  Surgeon: Nickie Retort, MD;  Location:  [facility];  Service: Urology;;   Social History  Substance Use Topics  . Smoking status: Former Research scientist (life sciences)  . Smokeless tobacco: Never Used     Comment: quit in Feb of 1997  . Alcohol use No     Family History      Family History  Problem Relation Age of Onset  . Cancer Mother   . Varicose Veins Mother   . Cancer Father           Allergies  Allergen Reactions  . Celecoxib Other (See Comments)    Increased Blood Pressure  . Chlorpromazine Nausea Only  . Meloxicam Other (See Comments)    Hypertension  . Prednisone Other (See Comments)    Diabetic  . Amoxicillin-Pot Clavulanate Rash  . Penicillin V Potassium Rash     REVIEW OF SYSTEMS(Negative unless checked)  Constitutional: [] ?Weight loss[] ?Fever[] ?Chills Cardiac:[] ?Chest pain[] ?Chest pressure[] ?Palpitations [] ?Shortness of breath when laying flat [] ?Shortness of breath at rest [] ?Shortness of breath with exertion. Vascular: [] ?Pain in legs with walking[] ?Pain in legsat rest[] ?Pain in legs when laying flat [] ?Claudication [] ?Pain in feet when walking [] ?Pain in feet at rest [] ?Pain in feet when laying flat [] ?History of DVT [] ?Phlebitis [] ?Swelling in legs [] ?Varicose veins [] ?Non-healing ulcers Pulmonary: [] ?Uses home oxygen [] ?Productive cough[] ?Hemoptysis [] ?Wheeze [] ?COPD [] ?Asthma Neurologic: [] ?Dizziness [] ?Blackouts [] ?Seizures [] ?History of stroke [] ?History of TIA[] ?Aphasia [] ?Temporary blindness[] ?Dysphagia [] ?Weaknessor numbness in arms [] ?Weakness or numbnessin legs Musculoskeletal: [x] ?Arthritis [] ?Joint swelling [x] ?Joint pain [] ?Low back pain Hematologic:[] ?Easy bruising[] ?Easy bleeding [] ?Hypercoagulable state [] ?Anemic  Gastrointestinal:[] ?Blood in stool[] ?Vomiting blood[] ?Gastroesophageal reflux/heartburn[] ?Abdominal pain Genitourinary: [] ?Chronic kidney disease  [] ?Difficulturination [] ?Frequenturination [] ?Burning with urination[] ?Hematuria Skin: [] ?Rashes [] ?Ulcers [] ?Wounds Psychological: [] ?History of anxiety[] ?History of major depression.     Physical Examination  BP 136/76 (BP Location: Left Arm, Patient Position: Sitting, Cuff Size: Normal)   Pulse 62   Resp 16   Ht 5\' 8"  (1.727 m)   Wt 216 lb 12.8 oz (98.3 kg)   BMI 32.96 kg/m  Gen:  WD/WN, NAD Head: [place]/AT, No temporalis wasting. Ear/Nose/Throat: Hearing grossly intact, nares w/o erythema or drainage Eyes: Conjunctiva clear. Sclera non-icteric Neck: Supple.  Trachea midline Pulmonary:  Good air movement, no use of accessory muscles.  Cardiac: RRR, no JVD Vascular:  Vessel Right Left  Radial Palpable Palpable                                   Gastrointestinal: soft, non-tender/non-distended. No guarding/reflex.  No increased aortic impulse Musculoskeletal: M/S 5/5 throughout.  No deformity or atrophy. Neurologic: Sensation grossly intact in extremities.  Symmetrical.  Speech is fluent.  Psychiatric: Judgment intact, Mood & affect appropriate for pt's clinical situation. Dermatologic: No rashes or ulcers noted.  No cellulitis or open wounds.  Labs No results found for this or any previous visit (from the past 2160 hour(s)).  Radiology No results found.  Assessment/Plan Diabetes mellitus ([place]) blood glucose control important in reducing the progression of atherosclerotic disease. Also, involved in wound healing. On appropriate medications.   BP (high blood pressure) blood pressure control important in reducing the progression of atherosclerotic diseaseand aneurysmal disease. On appropriate oral medications.  Abdominal aortic aneurysm (AAA) ([place]) aortic duplex today shows no change in his abdominal aortic aneurysm measuring 3.95 cm x 3.82 cm in maximal diameter. Continue annual surveillance for now.  No changes in his medical  regimen.  Blood pressure control importance was again discussed.    Leotis Pain, MD  11/06/2018 11:09 AM    This note was created with Dragon medical transcription system.  Any errors from dictation are purely unintentional

## 2018-11-06 NOTE — Patient Instructions (Signed)
Abdominal Aortic Aneurysm    An aneurysm is a bulge in one of the blood vessels that carry blood away from the heart (artery). It happens when blood pushes up against a weak or damaged place in the wall of an artery. An abdominal aortic aneurysm happens in the main artery of the body (aorta).  Some aneurysms may not cause problems. If it grows, it can burst or tear, causing bleeding inside the body. This is an emergency. It needs to be treated right away.  What are the causes?  The exact cause of this condition is not known.  What increases the risk?  The following may make you more likely to get this condition:   Being a male who is 60 years of age or older.   Being white (Caucasian).   Using tobacco.   Having a family history of aneurysms.   Having the following conditions:  ? Hardening of the arteries (arteriosclerosis).  ? Inflammation of the walls of an artery (arteritis).  ? Certain genetic conditions.  ? Being very overweight (obesity).  ? An infection in the wall of the aorta (infectious aortitis).  ? High cholesterol.  ? High blood pressure (hypertension).  What are the signs or symptoms?  Symptoms depend on the size of the aneurysm and how fast it is growing. Most grow slowly and do not cause any symptoms. If symptoms do occur, they may include:   Pain in the belly (abdomen), side, or back.   Feeling full after eating only small amounts of food.   Feeling a throbbing lump in the belly.  Symptoms that the aneurysm has burst (ruptured) include:   Sudden, very bad pain in the belly, side, or back.   Feeling sick to your stomach (nauseous).   Throwing up (vomiting).   Feeling light-headed or passing out.  How is this treated?  Treatment for this condition depends on:   The size of the aneurysm.   How fast it is growing.   Your age.   Your risk of having it burst.  If your aneurysm is smaller than 2 inches (5 cm), your doctor may manage it by:   Checking it often to see if it is getting bigger.  You may have an imaging test (ultrasound) to check it every 3-6 months, every year, or every few years.   Giving you medicines to:  ? Control blood pressure.  ? Treat pain.  ? Fight infection.  If your aneurysm is larger than 2 inches (5 cm), you may need surgery to fix it.  Follow these instructions at home:  Lifestyle   Do not use any products that have nicotine or tobacco in them. This includes cigarettes, e-cigarettes, and chewing tobacco. If you need help quitting, ask your doctor.   Get regular exercise. Ask your doctor what types of exercise are best for you.  Eating and drinking   Eat a heart-healthy diet. This includes eating plenty of:  ? Fresh fruits and vegetables.  ? Whole grains.  ? Low-fat (lean) protein.  ? Low-fat dairy products.   Avoid foods that are high in saturated fat and cholesterol. These foods include red meat and some dairy products.   Do not drink alcohol if:  ? Your doctor tells you not to drink.  ? You are pregnant, may be pregnant, or are planning to become pregnant.   If you drink alcohol:  ? Limit how much you use to:   0-1 drink a day for women.     0-2 drinks a day for men.  ? Be aware of how much alcohol is in your drink. In the [place] one drink equals any of these:   One typical bottle of beer (12 oz).   One-half glass of wine (5 oz).   One shot of hard liquor (1 oz).  General instructions   Take over-the-counter and prescription medicines only as told by your doctor.   Keep your blood pressure within normal limits. Ask your doctor what your blood pressure should be.   Have your blood sugar (glucose) level and cholesterol levels checked regularly. Keep your blood sugar level and cholesterol levels within normal limits.   Avoid heavy lifting and activities that take a lot of effort. Ask your doctor what activities are safe for you.   Keep all follow-up visits as told by your doctor. This is important.  ? Talk to your doctor about regular screenings to see if the  aneurysm is getting bigger.  Contact a doctor if you:   Have pain in your belly, side, or back.   Have a throbbing feeling in your belly.   Have a family history of aneurysms.  Get help right away if you:   Have sudden, bad pain in your belly, side, or back.   Feel sick to your stomach.   Throw up.   Have trouble pooping (constipation).   Have trouble peeing (urinating).   Feel light-headed.   Have a fast heart rate when you stand.   Have sweaty skin that is cold to the touch (clammy).   Have shortness of breath.   Have a fever.  These symptoms may be an emergency. Do not wait to see if the symptoms will go away. Get medical help right away. Call your local emergency services (911 in the [place] Do not drive yourself to the hospital.  Summary   An aneurysm is a bulge in one of the blood vessels that carry blood away from the heart (artery). Some aneurysms may not cause problems.   You may need to have yours checked often. If it grows, it can burst or tear. This causes bleeding inside the body. It needs to be treated right away.   Follow instructions from your doctor about healthy lifestyle changes.   Keep all follow-up visits as told by your doctor. This is important.  This information is not intended to replace advice given to you by your health care provider. Make sure you discuss any questions you have with your health care provider.  Document Released: 01/21/2013 Document Revised: 05/05/2018 Document Reviewed: 05/05/2018  Elsevier Interactive Patient Education  2019 Elsevier Inc.

## 2018-11-06 NOTE — Assessment & Plan Note (Signed)
aortic duplex today shows no change in his abdominal aortic aneurysm measuring 3.95 cm x 3.82 cm in maximal diameter. Continue annual surveillance for now.  No changes in his medical regimen.  Blood pressure control importance was again discussed.

## 2019-02-06 ENCOUNTER — Other Ambulatory Visit: Payer: Self-pay

## 2019-02-06 DIAGNOSIS — N401 Enlarged prostate with lower urinary tract symptoms: Secondary | ICD-10-CM

## 2019-02-06 MED ORDER — TAMSULOSIN HCL 0.4 MG PO CAPS: 0.4000 mg | ORAL_CAPSULE | Freq: Every day | ORAL | 2 refills | Status: DC

## 2019-02-06 MED ORDER — FINASTERIDE 5 MG PO TABS: 5.0000 mg | ORAL_TABLET | Freq: Every day | ORAL | 2 refills | Status: DC

## 2019-03-30 NOTE — Progress Notes (Signed)
[facility]  Telephone:([phone] [phone]  ID: Patrick Altes Ferris Jr. OB: 09-30-1934  MR#: [id number]  JGG#:[id number]  Patient Care Team: Idelle Crouch, MD as PCP - General (Internal Medicine)  CHIEF COMPLAINT: CLL  INTERVAL HISTORY: Patient returns to clinic today for routine yearly evaluation and discussion of his laboratory work.  He currently feels well and is asymptomatic. He denies any fevers, night sweats, or weight loss. He has noted no new lymphadenopathy. He has no neurologic complaints.  He denies any chest pain, shortness of breath, cough, or hemoptysis.  He denies any nausea, vomiting, constipation, or diarrhea. He has no urinary complaints.  Patient feels at his baseline offers no specific complaints today.  REVIEW OF SYSTEMS:   Review of Systems  Constitutional: Negative.  Negative for diaphoresis, fever, malaise/fatigue and weight loss.  Respiratory: Negative.  Negative for cough and shortness of breath.   Cardiovascular: Negative.  Negative for chest pain and leg swelling.  Gastrointestinal: Negative.  Negative for abdominal pain, blood in stool and melena.  Genitourinary: Negative.  Negative for dysuria.  Musculoskeletal: Negative.  Negative for back pain.  Skin: Negative.  Negative for rash.  Neurological: Negative.  Negative for dizziness, sensory change, focal weakness, weakness and headaches.  Psychiatric/Behavioral: Negative.  The patient is not nervous/anxious.     As per HPI. Otherwise, a complete review of systems is negative.  PAST MEDICAL HISTORY: Past Medical History:  Diagnosis Date  . Anxiety   . CKD (chronic kidney disease), stage III ([place])   . CLL (chronic lymphocytic leukemia) ([place])   . Diabetes mellitus without complication ([place])   . History of kidney stones   . HLD (hyperlipidemia)   . HTN (hypertension)   . PKD (polycystic kidney disease)   . Sleep apnea     PAST SURGICAL HISTORY: Past Surgical  History:  Procedure Laterality Date  . CARDIAC CATHETERIZATION  12/1987  . CENTRAL LINE INSERTION N/A 04/11/2017   Procedure: Central Line Insertion;  Surgeon: Delana Meyer Dolores Lory, MD;  Location: [facility];  Service: Cardiovascular;  Laterality: N/A;  . CERVICAL FUSION    . CHOLECYSTECTOMY    . CYSTOSCOPY W/ RETROGRADES Left 05/12/2017   Procedure: CYSTOSCOPY WITH RETROGRADE PYELOGRAM;  Surgeon: Nickie Retort, MD;  Location: [facility];  Service: Urology;  Laterality: Left;  . CYSTOSCOPY W/ URETERAL STENT PLACEMENT Left 05/12/2017   Procedure: CYSTOSCOPY WITH STENT REMOVAL;  Surgeon: Nickie Retort, MD;  Location: [facility];  Service: Urology;  Laterality: Left;  . CYSTOSCOPY WITH STENT PLACEMENT Left 04/11/2017   Procedure: CYSTOSCOPY WITH STENT PLACEMENT;  Surgeon: Festus Aloe, MD;  Location: [facility];  Service: Urology;  Laterality: Left;  . URETEROSCOPY  05/12/2017   Procedure: URETEROSCOPY;  Surgeon: Nickie Retort, MD;  Location: [facility];  Service: Urology;;    FAMILY HISTORY: Reported history of ovarian and lung cancer. Diabetes, hypertension.     ADVANCED DIRECTIVES:    HEALTH MAINTENANCE: Social History   Tobacco Use  . Smoking status: Former Research scientist (life sciences)  . Smokeless tobacco: Never Used  . Tobacco comment: quit in Feb of 1997  Substance Use Topics  . Alcohol use: No  . Drug use: No     Colonoscopy:  PAP:  Bone density:  Lipid panel:  Allergies  Allergen Reactions  . Celecoxib Other (See Comments)    Increased Blood Pressure  . Chlorpromazine Nausea Only  . Prednisone Other (See Comments)    Diabetic  . Amoxicillin-Pot Clavulanate  Rash    Has patient had a PCN reaction causing immediate rash, facial/tongue/throat swelling, SOB or lightheadedness with hypotension: No Has patient had a PCN reaction causing severe rash involving mucus membranes or skin necrosis: No Has patient had a PCN reaction that required hospitalization: No Has patient had a  PCN reaction occurring within the last 10 years: Yes If all of the above answers are "NO", then may proceed with Cephalosporin use.   Marland Kitchen Penicillin V Potassium Rash    Current Outpatient Medications  Medication Sig Dispense Refill  . Aflibercept (EYLEA) 2 MG/0.05ML SOLN Inject 1 Dose into the eye as directed. One injection into left eye every 8-9 weeks    . allopurinol (ZYLOPRIM) 100 MG tablet Take 100 mg by mouth daily.    Marland Kitchen atorvastatin (LIPITOR) 10 MG tablet Take 10 mg by mouth at bedtime.     . calcipotriene-betamethasone (TACLONEX) ointment Apply 1 application topically as needed (psoriasis).    . carvedilol (COREG) 12.5 MG tablet Take 12.5 mg by mouth 2 (two) times daily with a meal.    . Cholecalciferol (VITAMIN D-3) 5000 units TABS Take 5,000 Units by mouth at bedtime.     . clonazePAM (KLONOPIN) 1 MG tablet Take 1 mg by mouth at bedtime.     . fexofenadine (ALLEGRA) 180 MG tablet Take 180 mg by mouth daily as needed for allergies.     . finasteride (PROSCAR) 5 MG tablet Take 1 tablet (5 mg total) by mouth daily. 90 tablet 2  . ibuprofen (ADVIL,MOTRIN) 200 MG tablet Take 400 mg by mouth every 6 (six) hours as needed for mild pain.    Marland Kitchen insulin glargine (LANTUS) 100 UNIT/ML injection Inject 45 Units into the skin daily.     Marland Kitchen LORazepam (ATIVAN) 1 MG tablet Take 1 mg by mouth every 8 (eight) hours as needed for anxiety (takes 1 tablet every morning.Rarely has to take again during the day).     . metFORMIN (GLUCOPHAGE) 500 MG tablet Take by mouth 2 (two) times daily with a meal.    . Multiple Vitamins-Minerals (PRESERVISION AREDS 2 PO) Take 1 capsule by mouth 2 (two) times daily.     Marland Kitchen omega-3 acid ethyl esters (LOVAZA) 1 g capsule Take by mouth 2 (two) times daily.    . tamsulosin (FLOMAX) 0.4 MG CAPS capsule Take 1 capsule (0.4 mg total) by mouth daily after supper. 90 capsule 2  . triamcinolone (NASACORT) 55 MCG/ACT AERO nasal inhaler Place 2 sprays into the nose 2 (two) times daily  as needed (allergies).     . cyanocobalamin 500 MCG tablet Take 1,000 mcg by mouth at bedtime.      No current facility-administered medications for this visit.     OBJECTIVE: Vitals:   04/02/19 1414  BP: (!) 152/77  Pulse: 78  Temp: (!) 97.5 F (36.4 C)     Body mass index is 34.46 kg/m.    ECOG FS:0 - Asymptomatic  General: Well-developed, well-nourished, no acute distress. Eyes: Pink conjunctiva, anicteric sclera. HEENT: Normocephalic, moist mucous membranes, clear oropharnyx. Lungs: Clear to auscultation bilaterally. Heart: Regular rate and rhythm. No rubs, murmurs, or gallops. Abdomen: Soft, nontender, nondistended. No organomegaly noted, normoactive bowel sounds. Musculoskeletal: No edema, cyanosis, or clubbing. Neuro: Alert, answering all questions appropriately. Cranial nerves grossly intact. Skin: No rashes or petechiae noted. Psych: Normal affect. Lymphatics: No cervical, calvicular, axillary or inguinal LAD.   LAB RESULTS:  Lab Results  Component Value Date   NA 138 06/09/2017  K 4.5 06/09/2017   CL 104 06/09/2017   CO2 21 06/09/2017   GLUCOSE 159 (H) 06/09/2017   BUN 26 06/09/2017   CREATININE 1.44 (H) 06/09/2017   CALCIUM 8.6 06/09/2017   PROT 6.1 (L) 04/09/2017   ALBUMIN 2.5 (L) 04/17/2017   AST 22 04/09/2017   ALT 19 04/09/2017   ALKPHOS 76 04/09/2017   BILITOT 0.9 04/09/2017   GFRNONAA 46 (L) 06/09/2017   GFRAA 53 (L) 06/09/2017    Lab Results  Component Value Date   WBC 18.3 (H) 04/02/2019   NEUTROABS 3.0 04/02/2019   HGB 10.2 (L) 04/02/2019   HCT 31.4 (L) 04/02/2019   MCV 101.6 (H) 04/02/2019   PLT 60 (L) 04/02/2019     STUDIES: No results found.  ASSESSMENT: [place]ai stage I CLL.  PLAN:    1. CLL: Patient's white blood count remains mildly elevated 18.3, but this is essentially unchanged for greater than 10 years.  He also has a chronic thrombocytopenia that is also unchanged.  Continue simple observation.  Patient has a CBC  monitored several times per year by his primary care physician.  Return to clinic in 1 year for repeat laboratory work and further evaluation. 2. Thrombocytopenia: Platelet count is 60 today, but this is essentially unchanged for greater than 10 years.  No intervention is needed.  Patient does not require bone marrow biopsy.   3.  Chronic renal insufficiency: Continue monitoring and treatment per nephrology.    4. Lymphadenopathy: CT scan results from April 09, 2017 reviewed independently with enlarged pelvic and retroperitoneal lymph nodes. These are also noted on a scan in 2012 appear to be slightly larger. No intervention is needed. These are likely secondary to patient's underlying CLL. Return to clinic in 1 year as above.  I spent a total of 20 minutes face-to-face with the patient of which greater than 50% of the visit was spent in counseling and coordination of care as detailed above.   Patient expressed understanding and was in agreement with this plan. He also understands that He can call clinic at any time with any questions, concerns, or complaints.   Lloyd Huger, MD   04/04/2019 6:16 AM

## 2019-04-01 ENCOUNTER — Other Ambulatory Visit: Payer: Self-pay

## 2019-04-01 ENCOUNTER — Telehealth: Payer: Self-pay | Admitting: Oncology

## 2019-04-01 DIAGNOSIS — C9110 Chronic lymphocytic leukemia of B-cell type not having achieved remission: Secondary | ICD-10-CM

## 2019-04-01 NOTE — Telephone Encounter (Signed)
Spoke with pt to confirm appt date/time, do pre-appt screen which was completed, and adv of Covid-19 guidelines for appt regarding screening questions, temperature check, face mask required, and no visitors allowed °

## 2019-04-02 ENCOUNTER — Encounter: Payer: Self-pay | Admitting: Oncology

## 2019-04-02 ENCOUNTER — Inpatient Hospital Stay: Payer: Medicare Other

## 2019-04-02 ENCOUNTER — Inpatient Hospital Stay: Payer: Medicare Other | Attending: Oncology | Admitting: Oncology

## 2019-04-02 ENCOUNTER — Other Ambulatory Visit: Payer: Self-pay

## 2019-04-02 VITALS — BP 152/77 | HR 78 | Temp 97.5°F | Ht 67.0 in | Wt 220.0 lb

## 2019-04-02 DIAGNOSIS — F419 Anxiety disorder, unspecified: Secondary | ICD-10-CM | POA: Diagnosis not present

## 2019-04-02 DIAGNOSIS — I10 Essential (primary) hypertension: Secondary | ICD-10-CM | POA: Insufficient documentation

## 2019-04-02 DIAGNOSIS — Z791 Long term (current) use of non-steroidal anti-inflammatories (NSAID): Secondary | ICD-10-CM | POA: Insufficient documentation

## 2019-04-02 DIAGNOSIS — C9110 Chronic lymphocytic leukemia of B-cell type not having achieved remission: Secondary | ICD-10-CM | POA: Insufficient documentation

## 2019-04-02 DIAGNOSIS — D696 Thrombocytopenia, unspecified: Secondary | ICD-10-CM | POA: Diagnosis not present

## 2019-04-02 DIAGNOSIS — E119 Type 2 diabetes mellitus without complications: Secondary | ICD-10-CM

## 2019-04-02 DIAGNOSIS — Z794 Long term (current) use of insulin: Secondary | ICD-10-CM | POA: Insufficient documentation

## 2019-04-02 DIAGNOSIS — N419 Inflammatory disease of prostate, unspecified: Secondary | ICD-10-CM | POA: Insufficient documentation

## 2019-04-02 DIAGNOSIS — Z87891 Personal history of nicotine dependence: Secondary | ICD-10-CM | POA: Diagnosis not present

## 2019-04-02 DIAGNOSIS — Z79899 Other long term (current) drug therapy: Secondary | ICD-10-CM | POA: Diagnosis not present

## 2019-04-02 DIAGNOSIS — N183 Chronic kidney disease, stage 3 (moderate): Secondary | ICD-10-CM | POA: Diagnosis not present

## 2019-04-02 LAB — CBC WITH DIFFERENTIAL/PLATELET
Abs Immature Granulocytes: 0.02 10*3/uL (ref 0.00–0.07)
Basophils Absolute: 0.1 10*3/uL (ref 0.0–0.1)
Basophils Relative: 0 %
Eosinophils Absolute: 0.1 10*3/uL (ref 0.0–0.5)
Eosinophils Relative: 1 %
HCT: 31.4 % — ABNORMAL LOW (ref 39.0–52.0)
Hemoglobin: 10.2 g/dL — ABNORMAL LOW (ref 13.0–17.0)
Immature Granulocytes: 0 %
Lymphocytes Relative: 78 %
Lymphs Abs: 14.1 10*3/uL — ABNORMAL HIGH (ref 0.7–4.0)
MCH: 33 pg (ref 26.0–34.0)
MCHC: 32.5 g/dL (ref 30.0–36.0)
MCV: 101.6 fL — ABNORMAL HIGH (ref 80.0–100.0)
Monocytes Absolute: 1 10*3/uL (ref 0.1–1.0)
Monocytes Relative: 5 %
Neutro Abs: 3 10*3/uL (ref 1.7–7.7)
Neutrophils Relative %: 16 %
Platelets: 60 10*3/uL — ABNORMAL LOW (ref 150–400)
RBC: 3.09 MIL/uL — ABNORMAL LOW (ref 4.22–5.81)
RDW: 13.8 % (ref 11.5–15.5)
Smear Review: NORMAL
WBC: 18.3 10*3/uL — ABNORMAL HIGH (ref 4.0–10.5)
nRBC: 0 % (ref 0.0–0.2)

## 2019-04-02 NOTE — Progress Notes (Signed)
Patient stated that he had been doing well with no complaints. 

## 2019-04-03 ENCOUNTER — Other Ambulatory Visit: Payer: Medicare Other

## 2019-04-03 ENCOUNTER — Ambulatory Visit: Payer: Medicare Other | Admitting: Oncology

## 2019-06-20 ENCOUNTER — Other Ambulatory Visit: Payer: Self-pay

## 2019-06-20 ENCOUNTER — Ambulatory Visit: Payer: Medicare Other | Admitting: Urology

## 2019-06-20 DIAGNOSIS — N401 Enlarged prostate with lower urinary tract symptoms: Secondary | ICD-10-CM | POA: Diagnosis not present

## 2019-06-20 LAB — BLADDER SCAN AMB NON-IMAGING

## 2019-06-20 NOTE — Progress Notes (Signed)
   06/20/2019 2:38 PM   Patrick Altes Avellino Jr. Jul 02, 1934 [id number]  Reason for visit: Follow up BPH/LUTS, solitary kidney  HPI: I had the pleasure of seeing Patrick Payne in urology clinic today.  He is an 85 year old male with interesting urologic history.  He had acute onset of renal failure in July 2018 and was found to have left hydronephrosis and a functionally solitary left kidney, with severe polycystic disease of the right kidney.  He underwent ureteral stent placement which improved his renal function, and ultimately underwent negative ureteroscopy and hydronephrosis resolved.  Follow-up ultrasound 06/09/2017 showed resolution of his hydronephrosis after stent removal.  Since this he has been doing very well and denies any episodes of flank pain.  His creatinine is relatively stable at 1.8 from a baseline of approximately 1.7  Regarding his lower urinary tract symptoms, he has very minimal urinary complaints and is currently well managed on maximal medical therapy with finasteride and Flomax.  PVR in clinic today 0 mL.  He denies any gross hematuria or flank pain  Assessment & Plan:   In summary, Patrick Payne is an 85 year old male with history of left hydronephrosis of unclear etiology and functionally solitary left kidney since resolved after stent placement and negative ureteroscopy and stent removal in 2018.  Follow-up ultrasound confirmed resolution of his hydronephrosis and renal function is been stable.  His urinary symptoms are very mild, and well-controlled on Flomax and finasteride.  Continue Flomax and finasteride RTC 1 year for symptom check  A total of 15 minutes were spent face-to-face with the patient, greater than 50% was spent in patient education, counseling, and coordination of care regarding history of hydronephrosis, BPH, and urinary symptoms.   [name], [facility] [address] [place], [place] [address]  [phone]

## 2019-06-26 ENCOUNTER — Other Ambulatory Visit: Payer: Self-pay | Admitting: Nephrology

## 2019-06-26 ENCOUNTER — Telehealth: Payer: Self-pay | Admitting: Urology

## 2019-06-26 ENCOUNTER — Other Ambulatory Visit: Payer: Self-pay

## 2019-06-26 ENCOUNTER — Ambulatory Visit
Admission: RE | Admit: 2019-06-26 | Discharge: 2019-06-26 | Disposition: A | Discharge status: Home / Self Care | Payer: Medicare Other | Source: Ambulatory Visit | Attending: Nephrology | Admitting: Nephrology

## 2019-06-26 DIAGNOSIS — R319 Hematuria, unspecified: Secondary | ICD-10-CM | POA: Diagnosis present

## 2019-06-26 NOTE — Telephone Encounter (Signed)
Pt called to let you know that he is having bloody urine and pain in his groin, he is having a Renal US ordered by Dr Juleen China.

## 2019-08-05 DIAGNOSIS — I129 Hypertensive chronic kidney disease with stage 1 through stage 4 chronic kidney disease, or unspecified chronic kidney disease: Secondary | ICD-10-CM | POA: Insufficient documentation

## 2019-08-05 DIAGNOSIS — R319 Hematuria, unspecified: Secondary | ICD-10-CM | POA: Insufficient documentation

## 2019-08-05 DIAGNOSIS — R809 Proteinuria, unspecified: Secondary | ICD-10-CM | POA: Insufficient documentation

## 2019-08-05 DIAGNOSIS — N200 Calculus of kidney: Secondary | ICD-10-CM | POA: Insufficient documentation

## 2019-10-20 ENCOUNTER — Ambulatory Visit: Payer: Medicare Other | Attending: Internal Medicine

## 2019-10-20 DIAGNOSIS — Z23 Encounter for immunization: Secondary | ICD-10-CM

## 2019-10-20 NOTE — Progress Notes (Signed)
   [id number] Vaccination Clinic  Name:  Patrick Payne.    MRN: [id number] DOB: Nov 08, 1933  10/20/2019  Patrick Payne was observed post Covid-19 immunization for 30 minutes based on pre-vaccination screening without incidence. He was provided with Vaccine Information Sheet and instruction to access the V-Safe system.   Patrick Payne was instructed to call 911 with any severe reactions post vaccine: Marland Kitchen Difficulty breathing  . Swelling of your face and throat  . A fast heartbeat  . A bad rash all over your body  . Dizziness and weakness    Immunizations Administered    Name Date Dose VIS Date Route   Pfizer COVID-19 Vaccine 10/20/2019 12:49 PM 0.3 mL 09/20/2019 Intramuscular   Manufacturer: [place]   Lot: [id number]   [place]: [id number]

## 2019-10-25 ENCOUNTER — Other Ambulatory Visit: Payer: Self-pay

## 2019-10-25 DIAGNOSIS — N401 Enlarged prostate with lower urinary tract symptoms: Secondary | ICD-10-CM

## 2019-10-25 MED ORDER — TAMSULOSIN HCL 0.4 MG PO CAPS: 0.4000 mg | ORAL_CAPSULE | Freq: Every day | ORAL | 2 refills | Status: DC

## 2019-10-25 MED ORDER — FINASTERIDE 5 MG PO TABS: 5.0000 mg | ORAL_TABLET | Freq: Every day | ORAL | 2 refills | Status: DC

## 2019-11-05 DIAGNOSIS — D631 Anemia in chronic kidney disease: Secondary | ICD-10-CM | POA: Insufficient documentation

## 2019-11-05 DIAGNOSIS — N2581 Secondary hyperparathyroidism of renal origin: Secondary | ICD-10-CM | POA: Insufficient documentation

## 2019-11-05 DIAGNOSIS — N189 Chronic kidney disease, unspecified: Secondary | ICD-10-CM | POA: Insufficient documentation

## 2019-11-08 ENCOUNTER — Ambulatory Visit (INDEPENDENT_AMBULATORY_CARE_PROVIDER_SITE_OTHER): Payer: Medicare Other

## 2019-11-08 ENCOUNTER — Other Ambulatory Visit: Payer: Self-pay

## 2019-11-08 ENCOUNTER — Encounter (INDEPENDENT_AMBULATORY_CARE_PROVIDER_SITE_OTHER): Payer: Self-pay | Admitting: Vascular Surgery

## 2019-11-08 ENCOUNTER — Ambulatory Visit (INDEPENDENT_AMBULATORY_CARE_PROVIDER_SITE_OTHER): Payer: Medicare Other | Admitting: Vascular Surgery

## 2019-11-08 VITALS — BP 147/71 | HR 66 | Resp 15 | Wt 221.7 lb

## 2019-11-08 DIAGNOSIS — I714 Abdominal aortic aneurysm, without rupture, unspecified: Secondary | ICD-10-CM

## 2019-11-08 DIAGNOSIS — E119 Type 2 diabetes mellitus without complications: Secondary | ICD-10-CM

## 2019-11-08 DIAGNOSIS — I700 Atherosclerosis of aorta: Secondary | ICD-10-CM | POA: Insufficient documentation

## 2019-11-08 DIAGNOSIS — I10 Essential (primary) hypertension: Secondary | ICD-10-CM | POA: Diagnosis not present

## 2019-11-08 NOTE — Progress Notes (Signed)
MRN : [id number]  Patrick Payne Brooke Bonito. is a 86 y.o. ([birth year]) male who presents with chief complaint of  Chief Complaint  Patient presents with  . Follow-up    ultrasound follow up  .  History of Present Illness: Patient returns today in follow up of his aneurysm.  He has no aneurysm related symptoms.  He is doing well today without complaints.  He has no aneurysm related symptoms. Specifically, the patient denies new back or abdominal pain, or signs of peripheral embolization Aortic duplex today does show some enlargement of the abdominal aortic aneurysm now measuring 4.3 cm in maximal diameter.  Current Outpatient Medications  Medication Sig Dispense Refill  . Aflibercept (EYLEA) 2 MG/0.05ML SOLN Inject 1 Dose into the eye as directed. One injection into left eye every 8-9 weeks    . allopurinol (ZYLOPRIM) 100 MG tablet Take 100 mg by mouth daily.    Marland Kitchen atorvastatin (LIPITOR) 10 MG tablet Take 10 mg by mouth at bedtime.     . calcipotriene-betamethasone (TACLONEX) ointment Apply 1 application topically as needed (psoriasis).    . carvedilol (COREG) 12.5 MG tablet Take 12.5 mg by mouth 2 (two) times daily with a meal.    . Cholecalciferol (VITAMIN D-3) 5000 units TABS Take 5,000 Units by mouth at bedtime.     . clonazePAM (KLONOPIN) 1 MG tablet Take 1 mg by mouth at bedtime.     . cyanocobalamin 500 MCG tablet Take 1,000 mcg by mouth at bedtime.     . fexofenadine (ALLEGRA) 180 MG tablet Take 180 mg by mouth daily as needed for allergies.     . finasteride (PROSCAR) 5 MG tablet Take 1 tablet (5 mg total) by mouth daily. 90 tablet 2  . ibuprofen (ADVIL,MOTRIN) 200 MG tablet Take 400 mg by mouth every 6 (six) hours as needed for mild pain.    Marland Kitchen insulin glargine (LANTUS) 100 UNIT/ML injection Inject 50 Units into the skin daily.     Marland Kitchen LORazepam (ATIVAN) 1 MG tablet Take 1 mg by mouth every 8 (eight) hours as needed for anxiety (takes 1 tablet every morning.Rarely has to take again  during the day).     . Multiple Vitamins-Minerals (PRESERVISION AREDS 2 PO) Take 1 capsule by mouth 2 (two) times daily.     Marland Kitchen omega-3 acid ethyl esters (LOVAZA) 1 g capsule Take by mouth 2 (two) times daily.    . tamsulosin (FLOMAX) 0.4 MG CAPS capsule Take 1 capsule (0.4 mg total) by mouth daily after supper. 90 capsule 2  . telmisartan (MICARDIS) 80 MG tablet Take 80 mg by mouth daily.    Marland Kitchen triamcinolone (NASACORT) 55 MCG/ACT AERO nasal inhaler Place 2 sprays into the nose 2 (two) times daily as needed (allergies).     . metFORMIN (GLUCOPHAGE) 500 MG tablet Take by mouth 2 (two) times daily with a meal.     No current facility-administered medications for this visit.    Past Medical History:  Diagnosis Date  . Anxiety   . CKD (chronic kidney disease), stage III   . CLL (chronic lymphocytic leukemia) ([place])   . Diabetes mellitus without complication ([place])   . History of kidney stones   . HLD (hyperlipidemia)   . HTN (hypertension)   . PKD (polycystic kidney disease)   . Sleep apnea     Past Surgical History:  Procedure Laterality Date  . CARDIAC CATHETERIZATION  12/1987  . CENTRAL LINE INSERTION N/A 04/11/2017   Procedure: Central  Line Insertion;  Surgeon: Katha Cabal, MD;  Location: [facility];  Service: Cardiovascular;  Laterality: N/A;  . CERVICAL FUSION    . CHOLECYSTECTOMY    . CYSTOSCOPY W/ RETROGRADES Left 05/12/2017   Procedure: CYSTOSCOPY WITH RETROGRADE PYELOGRAM;  Surgeon: Nickie Retort, MD;  Location: [facility];  Service: Urology;  Laterality: Left;  . CYSTOSCOPY W/ URETERAL STENT PLACEMENT Left 05/12/2017   Procedure: CYSTOSCOPY WITH STENT REMOVAL;  Surgeon: Nickie Retort, MD;  Location: [facility];  Service: Urology;  Laterality: Left;  . CYSTOSCOPY WITH STENT PLACEMENT Left 04/11/2017   Procedure: CYSTOSCOPY WITH STENT PLACEMENT;  Surgeon: Festus Aloe, MD;  Location: [facility];  Service: Urology;  Laterality: Left;  . URETEROSCOPY  05/12/2017     Procedure: URETEROSCOPY;  Surgeon: Nickie Retort, MD;  Location: [facility];  Service: Urology;;     Social History   Tobacco Use  . Smoking status: Former Research scientist (life sciences)  . Smokeless tobacco: Never Used  . Tobacco comment: quit in Feb of 1997  Substance Use Topics  . Alcohol use: No  . Drug use: No    Family History  Problem Relation Age of Onset  . Cancer Mother   . Varicose Veins Mother   . Cancer Father     Allergies  Allergen Reactions  . Celecoxib Other (See Comments)    Increased Blood Pressure  . Chlorpromazine Nausea Only  . Prednisone Other (See Comments)    Diabetic  . Amoxicillin-Pot Clavulanate Rash    Has patient had a PCN reaction causing immediate rash, facial/tongue/throat swelling, SOB or lightheadedness with hypotension: No Has patient had a PCN reaction causing severe rash involving mucus membranes or skin necrosis: No Has patient had a PCN reaction that required hospitalization: No Has patient had a PCN reaction occurring within the last 10 years: Yes If all of the above answers are "NO", then may proceed with Cephalosporin use.   Marland Kitchen Penicillin V Potassium Rash    REVIEW OF SYSTEMS(Negative unless checked)  Constitutional: [] ??Weight loss[] ??Fever[] ??Chills Cardiac:[] ??Chest pain[] ??Chest pressure[] ??Palpitations [] ??Shortness of breath when laying flat [] ??Shortness of breath at rest [] ??Shortness of breath with exertion. Vascular: [] ??Pain in legs with walking[] ??Pain in legsat rest[] ??Pain in legs when laying flat [] ??Claudication [] ??Pain in feet when walking [] ??Pain in feet at rest [] ??Pain in feet when laying flat [] ??History of DVT [] ??Phlebitis [] ??Swelling in legs [] ??Varicose veins [] ??Non-healing ulcers Pulmonary: [] ??Uses home oxygen [] ??Productive cough[] ??Hemoptysis [] ??Wheeze [] ??COPD [] ??Asthma Neurologic: [] ??Dizziness [] ??Blackouts [] ??Seizures [] ??History of stroke  [] ??History of TIA[] ??Aphasia [] ??Temporary blindness[] ??Dysphagia [] ??Weaknessor numbness in arms [] ??Weakness or numbnessin legs Musculoskeletal: [x] ??Arthritis [] ??Joint swelling [x] ??Joint pain [] ??Low back pain Hematologic:[] ??Easy bruising[] ??Easy bleeding [] ??Hypercoagulable state [] ??Anemic  Gastrointestinal:[] ??Blood in stool[] ??Vomiting blood[] ??Gastroesophageal reflux/heartburn[] ??Abdominal pain Genitourinary: [] ??Chronic kidney disease [] ??Difficulturination [] ??Frequenturination [] ??Burning with urination[] ??Hematuria Skin: [] ??Rashes [] ??Ulcers [] ??Wounds Psychological: [] ??History of anxiety[] ??History of major depression.  Physical Examination  BP (!) 147/71 (BP Location: Right Arm)   Pulse 66   Resp 15   Wt 221 lb 10.4 oz (100.5 kg)   BMI 34.72 kg/m  Gen:  WD/WN, NAD.  Appears younger than stated age Head: [place]/AT, No temporalis wasting. Ear/Nose/Throat: Hearing grossly intact, nares w/o erythema or drainage Eyes: Conjunctiva clear. Sclera non-icteric Neck: Supple.  Trachea midline Pulmonary:  Good air movement, no use of accessory muscles.  Cardiac: RRR, no JVD Vascular:  Vessel Right Left  Radial Palpable Palpable  Gastrointestinal: soft, non-tender/non-distended.  Aorta not easily palpable due to body habitus Musculoskeletal: M/S 5/5 throughout.  No deformity or atrophy Neurologic: Sensation grossly intact in extremities.  Symmetrical.  Speech is fluent.  Psychiatric: Judgment intact, Mood & affect appropriate for pt's clinical situation. Dermatologic: No rashes or ulcers noted.  No cellulitis or open wounds.       Labs No results found for this or any previous visit (from the past 2160 hour(s)).  Radiology No results found.  Assessment/Plan Diabetes mellitus ([place]) blood glucose control important in reducing the progression of atherosclerotic disease.  Also, involved in wound healing. On appropriate medications.   BP (high blood pressure) blood pressure control important in reducing the progression of atherosclerotic diseaseand aneurysmal disease. On appropriate oral medications.  Abdominal aortic aneurysm (AAA) ([place]) Aortic duplex today does show some enlargement of the abdominal aortic aneurysm now measuring 4.3 cm in maximal diameter.  Still below the threshold for prophylactic repair, but will need a short interval follow-up and I will plan on seeing him in 6 months.    Leotis Pain, MD  11/08/2019 11:01 AM    This note was created with Dragon medical transcription system.  Any errors from dictation are purely unintentional

## 2019-11-08 NOTE — Patient Instructions (Signed)
Abdominal Aortic Aneurysm  An aneurysm is a bulge in one of the blood vessels that carry blood away from the heart (artery). It happens when blood pushes up against a weak or damaged place in the wall of an artery. An abdominal aortic aneurysm happens in the main artery of the body (aorta). Some aneurysms may not cause problems. If it grows, it can burst or tear, causing bleeding inside the body. This is an emergency. It needs to be treated right away. What are the causes? The exact cause of this condition is not known. What increases the risk? The following may make you more likely to get this condition:  Being a male who is 60 years of age or older.  Being white (Caucasian).  Using tobacco.  Having a family history of aneurysms.  Having the following conditions: ? Hardening of the arteries (arteriosclerosis). ? Inflammation of the walls of an artery (arteritis). ? Certain genetic conditions. ? Being very overweight (obesity). ? An infection in the wall of the aorta (infectious aortitis). ? High cholesterol. ? High blood pressure (hypertension). What are the signs or symptoms? Symptoms depend on the size of the aneurysm and how fast it is growing. Most grow slowly and do not cause any symptoms. If symptoms do occur, they may include:  Pain in the belly (abdomen), side, or back.  Feeling full after eating only small amounts of food.  Feeling a throbbing lump in the belly. Symptoms that the aneurysm has burst (ruptured) include:  Sudden, very bad pain in the belly, side, or back.  Feeling sick to your stomach (nauseous).  Throwing up (vomiting).  Feeling light-headed or passing out. How is this treated? Treatment for this condition depends on:  The size of the aneurysm.  How fast it is growing.  Your age.  Your risk of having it burst. If your aneurysm is smaller than 2 inches (5 cm), your doctor may manage it by:  Checking it often to see if it is getting bigger.  You may have an imaging test (ultrasound) to check it every 3-6 months, every year, or every few years.  Giving you medicines to: ? Control blood pressure. ? Treat pain. ? Fight infection. If your aneurysm is larger than 2 inches (5 cm), you may need surgery to fix it. Follow these instructions at home: Lifestyle  Do not use any products that have nicotine or tobacco in them. This includes cigarettes, e-cigarettes, and chewing tobacco. If you need help quitting, ask your doctor.  Get regular exercise. Ask your doctor what types of exercise are best for you. Eating and drinking  Eat a heart-healthy diet. This includes eating plenty of: ? Fresh fruits and vegetables. ? Whole grains. ? Low-fat (lean) protein. ? Low-fat dairy products.  Avoid foods that are high in saturated fat and cholesterol. These foods include red meat and some dairy products.  Do not drink alcohol if: ? Your doctor tells you not to drink. ? You are pregnant, may be pregnant, or are planning to become pregnant.  If you drink alcohol: ? Limit how much you use to:  0-1 drink a day for women.  0-2 drinks a day for men. ? Be aware of how much alcohol is in your drink. In the [place] one drink equals any of these:  One typical bottle of beer (12 oz).  One-half glass of wine (5 oz).  One shot of hard liquor (1 oz). General instructions  Take over-the-counter and prescription medicines only as   told by your doctor.  Keep your blood pressure within normal limits. Ask your doctor what your blood pressure should be.  Have your blood sugar (glucose) level and cholesterol levels checked regularly. Keep your blood sugar level and cholesterol levels within normal limits.  Avoid heavy lifting and activities that take a lot of effort. Ask your doctor what activities are safe for you.  Keep all follow-up visits as told by your doctor. This is important. ? Talk to your doctor about regular screenings to see if the  aneurysm is getting bigger. Contact a doctor if you:  Have pain in your belly, side, or back.  Have a throbbing feeling in your belly.  Have a family history of aneurysms. Get help right away if you:  Have sudden, bad pain in your belly, side, or back.  Feel sick to your stomach.  Throw up.  Have trouble pooping (constipation).  Have trouble peeing (urinating).  Feel light-headed.  Have a fast heart rate when you stand.  Have sweaty skin that is cold to the touch (clammy).  Have shortness of breath.  Have a fever. These symptoms may be an emergency. Do not wait to see if the symptoms will go away. Get medical help right away. Call your local emergency services (911 in the [place] Do not drive yourself to the hospital. Summary  An aneurysm is a bulge in one of the blood vessels that carry blood away from the heart (artery). Some aneurysms may not cause problems.  You may need to have yours checked often. If it grows, it can burst or tear. This causes bleeding inside the body. It needs to be treated right away.  Follow instructions from your doctor about healthy lifestyle changes.  Keep all follow-up visits as told by your doctor. This is important. This information is not intended to replace advice given to you by your health care provider. Make sure you discuss any questions you have with your health care provider. Document Revised: 01/14/2019 Document Reviewed: 05/05/2018 Elsevier Patient Education  2020 Elsevier Inc.  

## 2019-11-08 NOTE — Assessment & Plan Note (Signed)
Aortic duplex today does show some enlargement of the abdominal aortic aneurysm now measuring 4.3 cm in maximal diameter.  Still below the threshold for prophylactic repair, but will need a short interval follow-up and I will plan on seeing him in 6 months.

## 2019-11-10 ENCOUNTER — Ambulatory Visit: Payer: Medicare Other | Attending: Internal Medicine

## 2019-11-10 DIAGNOSIS — Z23 Encounter for immunization: Secondary | ICD-10-CM | POA: Insufficient documentation

## 2019-11-10 NOTE — Progress Notes (Signed)
   [id number] Vaccination Clinic  Name:  Quenten Steers.    MRN: [id number] DOB: April 27, 1934  11/10/2019  Mr. Herman was observed post Covid-19 immunization for 15 minutes without incidence. He was provided with Vaccine Information Sheet and instruction to access the V-Safe system.   Mr. Chludzinski was instructed to call 911 with any severe reactions post vaccine: Marland Kitchen Difficulty breathing  . Swelling of your face and throat  . A fast heartbeat  . A bad rash all over your body  . Dizziness and weakness    Immunizations Administered    Name Date Dose VIS Date Route   Pfizer COVID-19 Vaccine 11/10/2019 12:04 PM 0.3 mL 09/20/2019 Intramuscular   Manufacturer: [place]   Lot: [id number]   [place]: [id number]

## 2019-11-19 ENCOUNTER — Emergency Department
Admission: EM | Admit: 2019-11-19 | Discharge: 2019-11-19 | Disposition: A | Discharge status: Home / Self Care | Payer: Medicare Other | Attending: Emergency Medicine | Admitting: Emergency Medicine

## 2019-11-19 ENCOUNTER — Other Ambulatory Visit: Payer: Self-pay

## 2019-11-19 ENCOUNTER — Emergency Department: Payer: Medicare Other

## 2019-11-19 DIAGNOSIS — I129 Hypertensive chronic kidney disease with stage 1 through stage 4 chronic kidney disease, or unspecified chronic kidney disease: Secondary | ICD-10-CM | POA: Insufficient documentation

## 2019-11-19 DIAGNOSIS — R1013 Epigastric pain: Secondary | ICD-10-CM | POA: Diagnosis not present

## 2019-11-19 DIAGNOSIS — N183 Chronic kidney disease, stage 3 unspecified: Secondary | ICD-10-CM | POA: Insufficient documentation

## 2019-11-19 DIAGNOSIS — Z79899 Other long term (current) drug therapy: Secondary | ICD-10-CM | POA: Diagnosis not present

## 2019-11-19 DIAGNOSIS — E1122 Type 2 diabetes mellitus with diabetic chronic kidney disease: Secondary | ICD-10-CM | POA: Diagnosis not present

## 2019-11-19 DIAGNOSIS — R0789 Other chest pain: Secondary | ICD-10-CM | POA: Insufficient documentation

## 2019-11-19 DIAGNOSIS — Z794 Long term (current) use of insulin: Secondary | ICD-10-CM | POA: Insufficient documentation

## 2019-11-19 DIAGNOSIS — R079 Chest pain, unspecified: Secondary | ICD-10-CM

## 2019-11-19 LAB — CBC
HCT: 29.7 % — ABNORMAL LOW (ref 39.0–52.0)
Hemoglobin: 9.6 g/dL — ABNORMAL LOW (ref 13.0–17.0)
MCH: 32.9 pg (ref 26.0–34.0)
MCHC: 32.3 g/dL (ref 30.0–36.0)
MCV: 101.7 fL — ABNORMAL HIGH (ref 80.0–100.0)
Platelets: 51 10*3/uL — ABNORMAL LOW (ref 150–400)
RBC: 2.92 MIL/uL — ABNORMAL LOW (ref 4.22–5.81)
RDW: 13.7 % (ref 11.5–15.5)
WBC: 16.6 10*3/uL — ABNORMAL HIGH (ref 4.0–10.5)
nRBC: 0 % (ref 0.0–0.2)

## 2019-11-19 LAB — BASIC METABOLIC PANEL
Anion gap: 10 (ref 5–15)
BUN: 33 mg/dL — ABNORMAL HIGH (ref 8–23)
CO2: 21 mmol/L — ABNORMAL LOW (ref 22–32)
Calcium: 9.6 mg/dL (ref 8.9–10.3)
Chloride: 109 mmol/L (ref 98–111)
Creatinine, Ser: 1.89 mg/dL — ABNORMAL HIGH (ref 0.61–1.24)
GFR calc Af Amer: 37 mL/min — ABNORMAL LOW (ref >60)
GFR calc non Af Amer: 32 mL/min — ABNORMAL LOW (ref >60)
Glucose, Bld: 159 mg/dL — ABNORMAL HIGH (ref 70–99)
Potassium: 3.9 mmol/L (ref 3.5–5.1)
Sodium: 140 mmol/L (ref 135–145)

## 2019-11-19 LAB — TROPONIN I (HIGH SENSITIVITY)
Troponin I (High Sensitivity): 7 ng/L (ref <18)
Troponin I (High Sensitivity): 7 ng/L (ref <18)

## 2019-11-19 MED ORDER — ALUM & MAG HYDROXIDE-SIMETH 200-200-20 MG/5ML PO SUSP
30.0000 mL | Freq: Once | ORAL | Status: AC
  Administered 2019-11-19: 10:00:00 30 mL via ORAL
  Filled 2019-11-19: qty 30

## 2019-11-19 MED ORDER — LIDOCAINE VISCOUS HCL 2 % MT SOLN
15.0000 mL | Freq: Once | OROMUCOSAL | Status: AC
  Administered 2019-11-19: 15 mL via ORAL
  Filled 2019-11-19: qty 15

## 2019-11-19 MED ORDER — SODIUM CHLORIDE 0.9% FLUSH: 3.0000 mL | Freq: Once | INTRAVENOUS | Status: DC

## 2019-11-19 NOTE — ED Provider Notes (Signed)
[facility] Emergency Department Provider Note  Time seen: 10:36 AM  I have reviewed the triage vital signs and the nursing notes.   HISTORY  Chief Complaint Chest Pain   HPI Patrick Payne. is a 86 y.o. male with a past medical history anxiety, CKD, CLL, diabetes, hypertension, hyperlipidemia presents to the emergency department for epigastric discomfort/chest pain and belching.  Patient states around 9 PM last night he developed some chest discomfort and epigastric discomfort which felt like reflux/indigestion to the patient.  He states he typically gets this and will take Tums to relieve the discomfort.  Patient took Tums last night and noted this morning that he still had mild discomfort in the chest.  He checked his blood pressure and it was elevated to 190/100, patient was concerned so he came to the emergency department.  Here the patient states very minimal discomfort, states it feels like indigestion but "better to be safe than sorry."  Denies any cardiac history.  No shortness of breath nausea or diaphoresis.  No recent illness cough congestion or fever.   Past Medical History:  Diagnosis Date  . Anxiety   . CKD (chronic kidney disease), stage III   . CLL (chronic lymphocytic leukemia) ([place])   . Diabetes mellitus without complication ([place])   . History of kidney stones   . HLD (hyperlipidemia)   . HTN (hypertension)   . PKD (polycystic kidney disease)   . Sleep apnea     Patient Active Problem List   Diagnosis Date Noted  . Aortic atherosclerosis ([place]) 11/08/2019  . Anemia in chronic kidney disease 11/05/2019  . Secondary hyperparathyroidism of renal origin ([place]) 11/05/2019  . Benign hypertensive kidney disease with chronic kidney disease 08/05/2019  . Hematuria 08/05/2019  . Nephrolithiasis 08/05/2019  . Proteinuria 08/05/2019  . Prostatitis 04/02/2019  . GAD (generalized anxiety disorder) 07/03/2018  . DM type 2 with diabetic peripheral  neuropathy ([place]) 06/28/2018  . Uncontrolled type 2 diabetes mellitus with hyperglycemia ([place]) 06/28/2018  . Polycystic kidney disease 04/10/2017  . Flank pain 04/09/2017  . Acute on chronic renal failure ([place]) 04/09/2017  . Diverticulitis 04/09/2017  . Long term current use of insulin ([place]) 07/16/2014  . Abdominal aortic aneurysm (AAA) ([place]) 04/17/2014  . Chronic kidney disease (CKD), stage III (moderate) 04/17/2014  . Chronic lymphocytic leukemia ([place]) 04/17/2014  . Diabetes mellitus ([place]) 04/17/2014  . BP (high blood pressure) 04/17/2014  . HLD (hyperlipidemia) 04/17/2014  . Obstructive apnea 04/17/2014  . H/O malignant neoplasm of skin 06/07/2013    Past Surgical History:  Procedure Laterality Date  . CARDIAC CATHETERIZATION  12/1987  . CENTRAL LINE INSERTION N/A 04/11/2017   Procedure: Central Line Insertion;  Surgeon: Delana Meyer Dolores Lory, MD;  Location: [place] [facility];  Service: Cardiovascular;  Laterality: N/A;  . CERVICAL FUSION    . CHOLECYSTECTOMY    . CYSTOSCOPY W/ RETROGRADES Left 05/12/2017   Procedure: CYSTOSCOPY WITH RETROGRADE PYELOGRAM;  Surgeon: Nickie Retort, MD;  Location: [facility];  Service: Urology;  Laterality: Left;  . CYSTOSCOPY W/ URETERAL STENT PLACEMENT Left 05/12/2017   Procedure: CYSTOSCOPY WITH STENT REMOVAL;  Surgeon: Nickie Retort, MD;  Location: [facility];  Service: Urology;  Laterality: Left;  . CYSTOSCOPY WITH STENT PLACEMENT Left 04/11/2017   Procedure: CYSTOSCOPY WITH STENT PLACEMENT;  Surgeon: Festus Aloe, MD;  Location: [facility];  Service: Urology;  Laterality: Left;  . URETEROSCOPY  05/12/2017   Procedure: URETEROSCOPY;  Surgeon: Nickie Retort, MD;  Location: [facility];  Service: Urology;;    Prior to Admission medications   Medication Sig Start Date End Date Taking? Authorizing Provider  Aflibercept (EYLEA) 2 MG/0.05ML SOLN Inject 1 Dose into the eye as directed. One injection into left eye every 8-9 weeks    [provider]  allopurinol (ZYLOPRIM) 100 MG tablet Take 100 mg by mouth daily.    [provider]  atorvastatin (LIPITOR) 10 MG tablet Take 10 mg by mouth at bedtime.  09/02/15   [provider]  calcipotriene-betamethasone (TACLONEX) ointment Apply 1 application topically as needed (psoriasis).    [provider]  carvedilol (COREG) 12.5 MG tablet Take 12.5 mg by mouth 2 (two) times daily with a meal.    [provider]  Cholecalciferol (VITAMIN D-3) 5000 units TABS Take 5,000 Units by mouth at bedtime.     [provider]  clonazePAM (KLONOPIN) 1 MG tablet Take 1 mg by mouth at bedtime.  09/06/15   [provider]  cyanocobalamin 500 MCG tablet Take 1,000 mcg by mouth at bedtime.     [provider]  fexofenadine (ALLEGRA) 180 MG tablet Take 180 mg by mouth daily as needed for allergies.     [provider]  finasteride (PROSCAR) 5 MG tablet Take 1 tablet (5 mg total) by mouth daily. 10/25/19   [name], MD  ibuprofen (ADVIL,MOTRIN) 200 MG tablet Take 400 mg by mouth every 6 (six) hours as needed for mild pain.    [provider]  insulin glargine (LANTUS) 100 UNIT/ML injection Inject 50 Units into the skin daily.     [provider]  LORazepam (ATIVAN) 1 MG tablet Take 1 mg by mouth every 8 (eight) hours as needed for anxiety (takes 1 tablet every morning.Rarely has to take again during the day).  11/01/15   [provider]  metFORMIN (GLUCOPHAGE) 500 MG tablet Take by mouth 2 (two) times daily with a meal.    [provider]  Multiple Vitamins-Minerals (PRESERVISION AREDS 2 PO) Take 1 capsule by mouth 2 (two) times daily.     [provider]  omega-3 acid ethyl esters (LOVAZA) 1 g capsule Take by mouth 2 (two) times daily.    [provider]  tamsulosin (FLOMAX) 0.4 MG CAPS capsule Take 1 capsule (0.4 mg total) by mouth daily after supper. 10/25/19   [name], MD  telmisartan (MICARDIS) 80 MG tablet Take 80 mg by mouth daily. 11/03/19   [provider]  triamcinolone (NASACORT) 55 MCG/ACT AERO nasal inhaler Place 2 sprays into the nose 2 (two) times daily as needed (allergies).     [provider]    Allergies  Allergen Reactions  . Celecoxib Other (See Comments)    Increased Blood Pressure  . Chlorpromazine Nausea Only  . Prednisone Other (See Comments)    Diabetic  . Amoxicillin-Pot Clavulanate Rash    Has patient had a PCN reaction causing immediate rash, facial/tongue/throat swelling, SOB or lightheadedness with hypotension: No Has patient had a PCN reaction causing severe rash involving mucus membranes or skin necrosis: No Has patient had a PCN reaction that required hospitalization: No Has patient had a PCN reaction occurring within the last 10 years: Yes If all of the above answers are "NO", then may proceed with Cephalosporin use.   Marland Kitchen Penicillin V Potassium Rash    Family History  Problem Relation Age of Onset  . Cancer Mother   . Varicose Veins  Mother   . Cancer Father     Social History Social History   Tobacco Use  . Smoking status: Former Research scientist (life sciences)  . Smokeless tobacco: Never Used  . Tobacco comment: quit in Feb of 1997  Substance Use Topics  . Alcohol use: No  . Drug use: No    Review of Systems Constitutional: Negative for fever. Cardiovascular: Mild chest discomfort.  Significant belching. Respiratory: Negative for shortness of breath. Gastrointestinal: Mild epigastric discomfort/chest discomfort.  Significant belching. Musculoskeletal: Negative for musculoskeletal complaints Neurological: Negative for headache All other ROS negative  ____________________________________________   PHYSICAL EXAM:  VITAL SIGNS: ED Triage Vitals  Enc Vitals Group     BP 11/19/19 0934 (!) 179/71     Pulse Rate 11/19/19 0934 64     Resp 11/19/19 0934 20     Temp 11/19/19 0934 97.6 F (36.4 C)      Temp Source 11/19/19 0934 Oral     SpO2 11/19/19 0934 94 %     Weight 11/19/19 0936 218 lb (98.9 kg)     Height 11/19/19 0936 5\' 9"  (1.753 m)     Head Circumference --      Peak Flow --      Pain Score 11/19/19 0935 4     Pain Loc --      Pain Edu? --      Excl. in [place]? --    Constitutional: Alert and oriented. Well appearing and in no distress. Eyes: Normal exam ENT      Head: Normocephalic and atraumatic.      Mouth/Throat: Mucous membranes are moist. Cardiovascular: Normal rate, regular rhythm. Respiratory: Normal respiratory effort without tachypnea nor retractions. Breath sounds are clear  Gastrointestinal: Soft and nontender. No distention.  Hyperactive bowel sounds. Musculoskeletal: Nontender with normal range of motion in all extremities.  Neurologic:  Normal speech and language. No gross focal neurologic deficits  Skin:  Skin is warm, dry and intact.  Psychiatric: Mood and affect are normal.   ____________________________________________    EKG  EKG viewed and interpreted by myself shows a normal sinus rhythm at 62 bpm with a prolonged PR interval consistent with first-degree AV block otherwise normal intervals, narrow QRS with normal axis.  ____________________________________________    RADIOLOGY  Chest x-ray is negative  ____________________________________________   INITIAL IMPRESSION / ASSESSMENT AND PLAN / ED COURSE  Pertinent labs & imaging results that were available during my care of the patient were reviewed by me and considered in my medical decision making (see chart for details).   Patient presents to the emergency department for epigastric and chest discomfort which he states is mostly resolved but continues have mild discomfort to this area.  Patient states it feels like indigestion and he has had significant belching overnight and this morning.  However after checking his blood pressure and it was elevated he wanted to come to the emergency  department to be safe per patient.  Differential would include ACS, gastric discomfort, reflux, chest wall discomfort.  Patient has a benign abdominal exam including no epigastric tenderness to palpation.  Does have rather hyperactive bowel sounds.  We will check labs, chest x-ray and continue to closely monitor.  No significant/concerning findings on EKG.  Patient states complete resolution of chest discomfort after GI cocktail.  Patient's work-up is largely at his baseline reassuringly troponin is negative.  Patient appears to have a large gas bubble within the stomach on my review of the chest x-ray.  No other acute  findings.  Given his reassuring work-up we will discharge patient home.  Discussed my normal chest pain return precautions.  Patrick Boor Veron Jr. was evaluated in Emergency Department on 11/19/2019 for the symptoms described in the history of present illness. He was evaluated in the context of the global COVID-19 pandemic, which necessitated consideration that the patient might be at risk for infection with the SARS-CoV-2 virus that causes COVID-19. Institutional protocols and algorithms that pertain to the evaluation of patients at risk for COVID-19 are in a state of rapid change based on information released by regulatory bodies including the CDC and federal and state organizations. These policies and algorithms were followed during the patient's care in the ED.  ____________________________________________   FINAL CLINICAL IMPRESSION(S) / ED DIAGNOSES  Chest pain   Harvest Dark, MD 11/19/19 1201

## 2019-11-19 NOTE — ED Notes (Signed)
Pt able to dress himself. Pt ambulatory with cane at this time. NAD noted upon DC.

## 2019-11-19 NOTE — ED Notes (Signed)
Pt denies CP/SHOB at this time. St  "I am bleaching a lot".

## 2019-11-19 NOTE — ED Triage Notes (Signed)
Pt presents from home, C/O central chest pressure beginning last night, thinking was heart burn.  Took TUMS last pm.  Woke this morning and central pressure continued, too Gas-X without relief. Denies nausea/SOB.  Pain non radiating.

## 2019-11-19 NOTE — ED Notes (Signed)
Pt ambulatory to toilet to void. This RN observe a cane at bedside; offered pt his cane to ambulate to toilet. Pt st "I don't need it. I just use it for balance". Pt ambulatory with a steady gait. Pt placed back on bed safely. Monitor placed.  No further need at this time.

## 2019-11-19 NOTE — ED Notes (Signed)
Pt to xray

## 2020-01-24 IMAGING — US US RENAL
1 series · 14 of 25 positions shown · non-contrast
Comparison: Ultrasound renal dated 06/09/2017. CT dated April 09, 2017.

CLINICAL DATA: Hematuria.

EXAM:
RENAL / URINARY TRACT ULTRASOUND COMPLETE

[Series 1: us renal · 0.23mm/px · 14 of 89 slices shown]
[im 1/89]
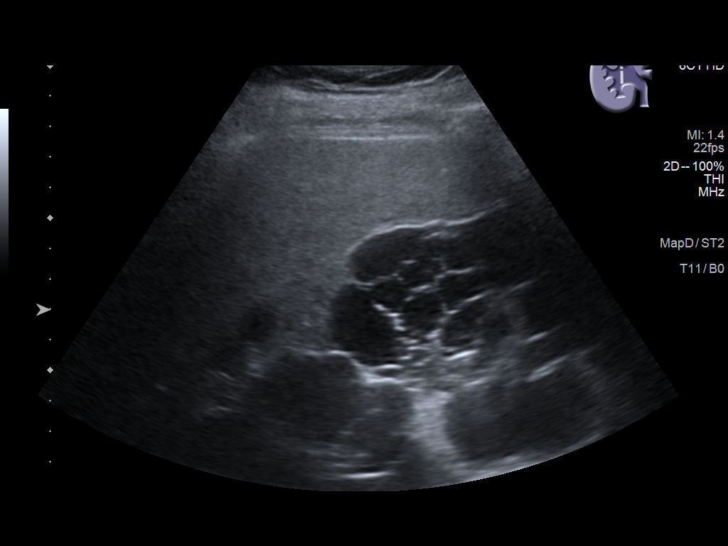
[im 8/89]
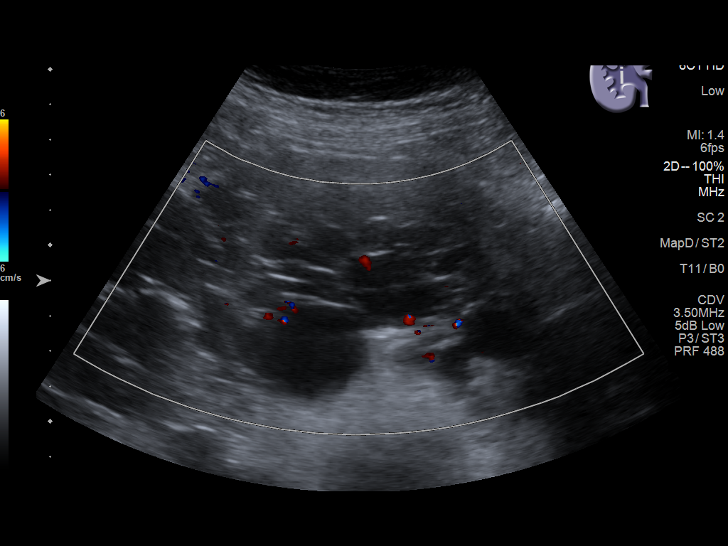
[im 15/89]
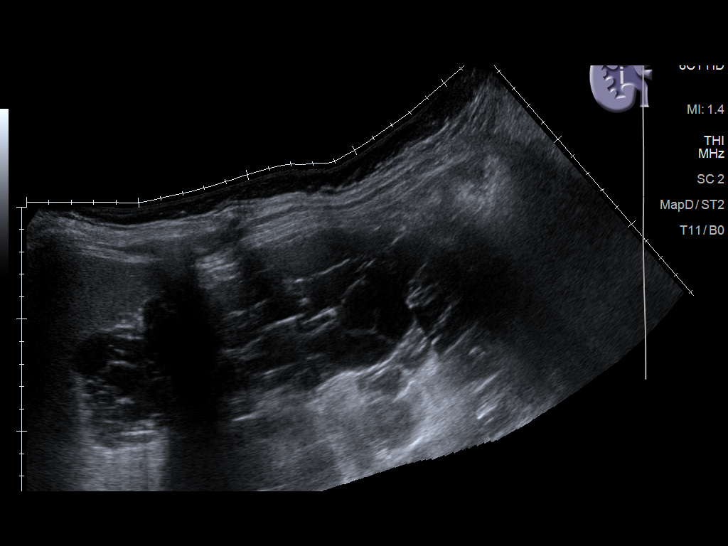
[im 23/89]
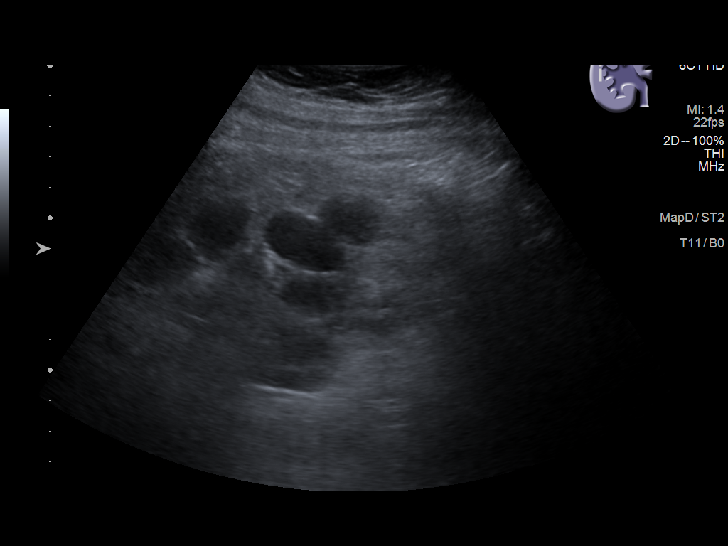
[im 30/89]
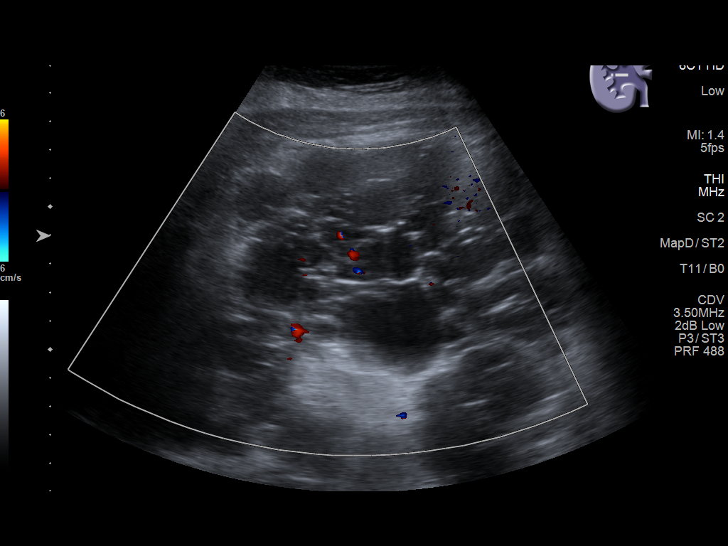
[im 34/89]
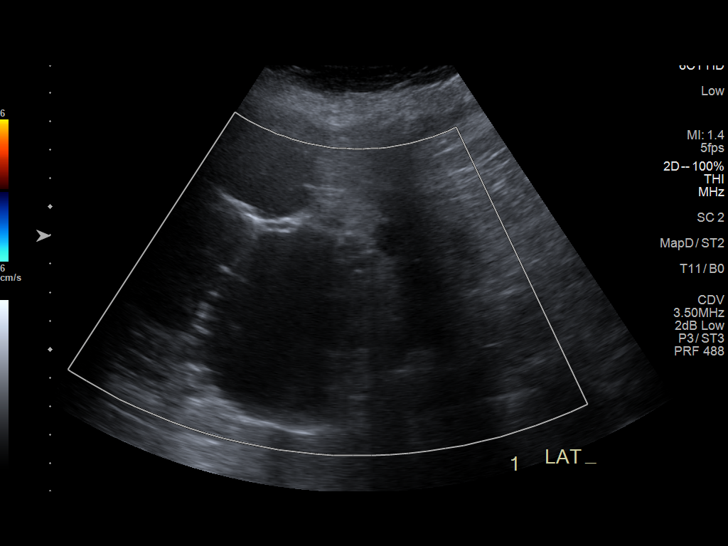
[im 41/89]
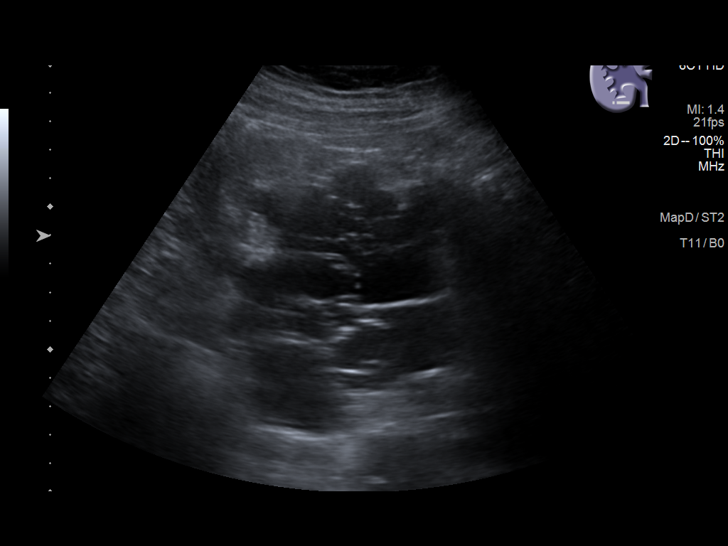
[im 48/89]
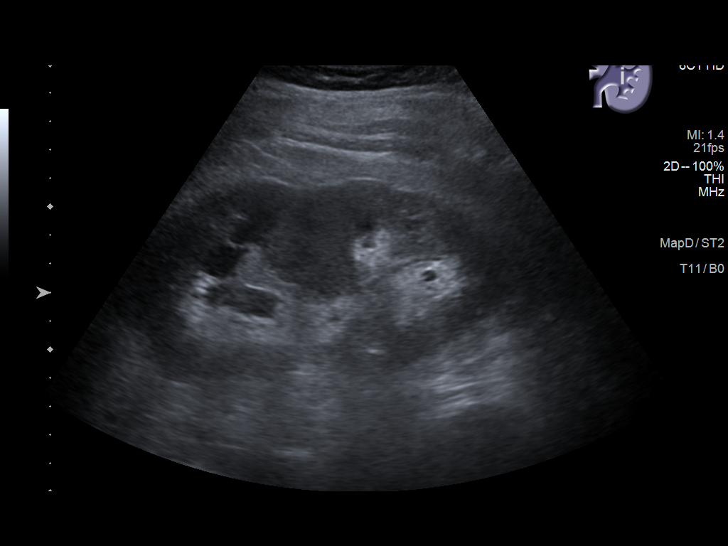
[im 56/89]
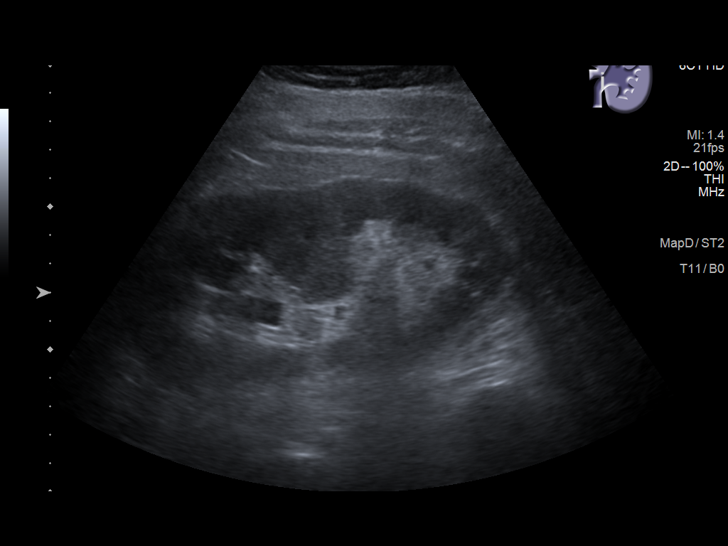
[im 59/89]
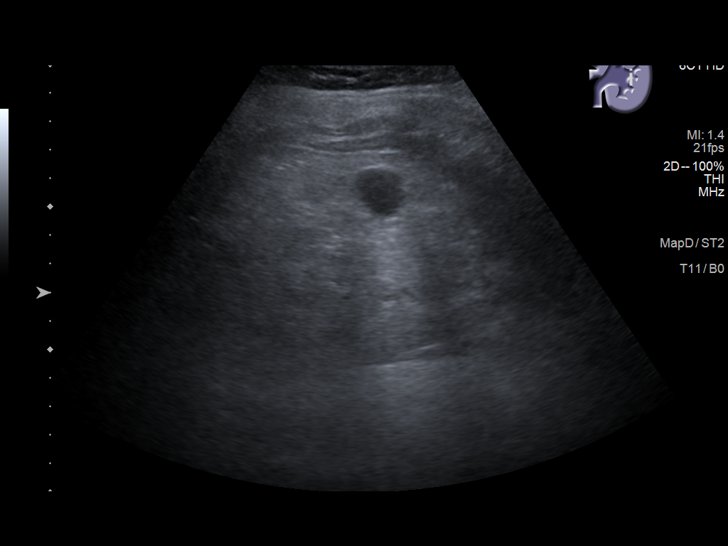
[im 67/89]
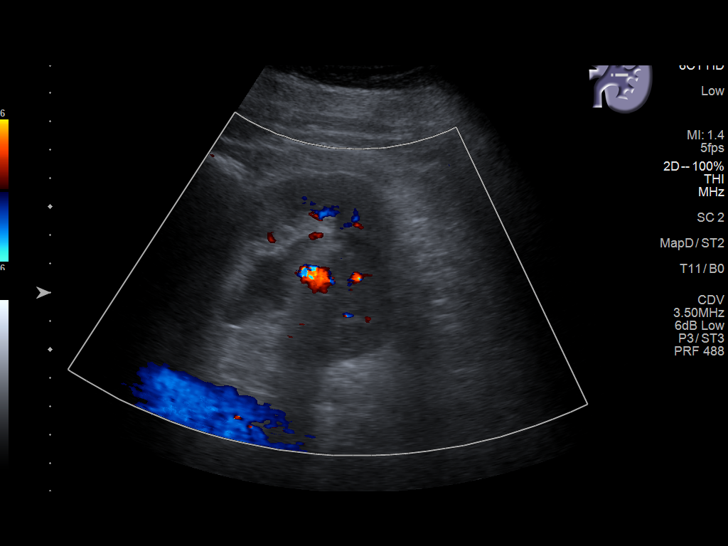
[im 74/89]
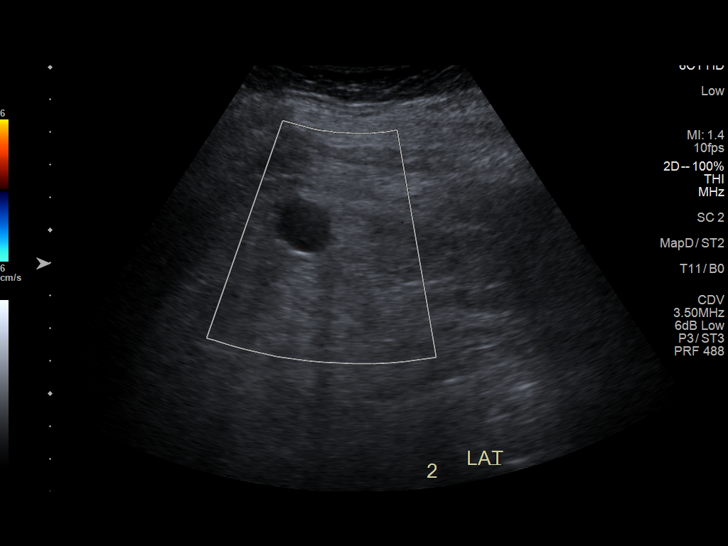
[im 81/89]
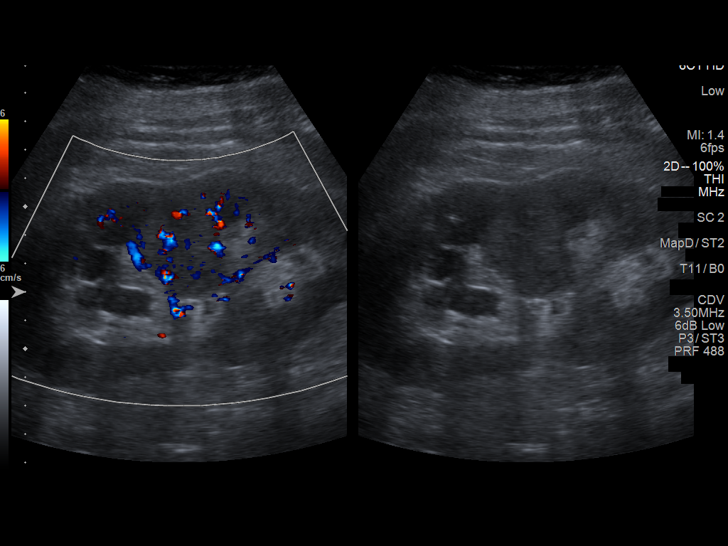
[im 89/89]
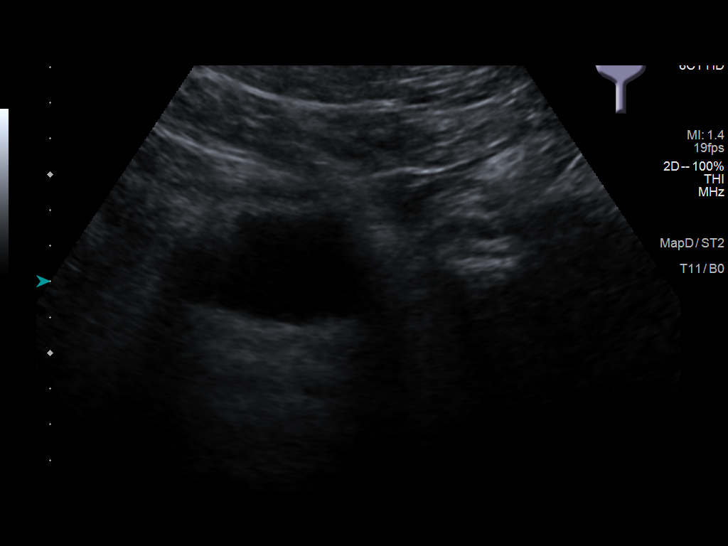

[14 of 25 positions shown; findings below may reference images not displayed]

FINDINGS: Right Kidney:

Renal measurements: 24.3 x 7.6 x 5.3 cm = volume: 508 mL. Again
noted are innumerable cysts measuring up to approximately 6.4 cm.
The appearance is similar to prior study. The overall size of the
kidney is significantly enlarged. There are no radiopaque shadowing
kidney stones. There is no convincing evidence for right-sided
hydronephrosis.

Left Kidney:

Renal measurements: 14.1 x 7 x 5.9 cm = volume: 302 mL. Multiple
small cysts are noted measuring up to approximately 2 cm. This is
similar to prior ultrasound. There appears to be some mild
left-sided hydronephrosis. There are no echogenic shadowing kidney
stones.

Bladder:

The urinary bladder is relatively decompressed which limits
evaluation.
IMPRESSION: 1. Enlarged polycystic right kidney, similar to prior studies.
2. Mild left-sided hydronephrosis. No echogenic shadowing kidney
stones identified on today's exam.

## 2020-03-26 NOTE — Progress Notes (Signed)
[place]  [phone] [phone]  ID: Patrick Altes Alford Jr. OB: 1934/06/13  MR#: [id number]  YOK#:[id number]  Patient Care Team: Idelle Crouch, MD as PCP - General (Internal Medicine) Lloyd Huger, MD as Consulting Physician (Oncology)  CHIEF COMPLAINT: CLL  INTERVAL HISTORY: Patient returns to clinic today for repeat laboratory can routine yearly evaluation.  He continues to feel well and remains asymptomatic. He denies any fevers, night sweats, or weight loss. He has noted no new lymphadenopathy. He has no neurologic complaints.  He denies any chest pain, shortness of breath, cough, or hemoptysis.  He denies any nausea, vomiting, constipation, or diarrhea. He has no urinary complaints.  Patient offers no specific complaints today.  REVIEW OF SYSTEMS:   Review of Systems  Constitutional: Negative.  Negative for diaphoresis, fever, malaise/fatigue and weight loss.  Respiratory: Negative.  Negative for cough and shortness of breath.   Cardiovascular: Negative.  Negative for chest pain and leg swelling.  Gastrointestinal: Negative.  Negative for abdominal pain, blood in stool and melena.  Genitourinary: Negative.  Negative for dysuria.  Musculoskeletal: Negative.  Negative for back pain.  Skin: Negative.  Negative for rash.  Neurological: Negative.  Negative for dizziness, sensory change, focal weakness, weakness and headaches.  Psychiatric/Behavioral: Negative.  The patient is not nervous/anxious.     As per HPI. Otherwise, a complete review of systems is negative.  PAST MEDICAL HISTORY: Past Medical History:  Diagnosis Date  . Anxiety   . CKD (chronic kidney disease), stage III   . CLL (chronic lymphocytic leukemia) ([place])   . Diabetes mellitus without complication ([place])   . History of kidney stones   . HLD (hyperlipidemia)   . HTN (hypertension)   . PKD (polycystic kidney disease)   . Sleep apnea     PAST SURGICAL  HISTORY: Past Surgical History:  Procedure Laterality Date  . CARDIAC CATHETERIZATION  12/1987  . CENTRAL LINE INSERTION N/A 04/11/2017   Procedure: Central Line Insertion;  Surgeon: Delana Meyer Dolores Lory, MD;  Location: [place] [facility];  Service: Cardiovascular;  Laterality: N/A;  . CERVICAL FUSION    . CHOLECYSTECTOMY    . CYSTOSCOPY W/ RETROGRADES Left 05/12/2017   Procedure: CYSTOSCOPY WITH RETROGRADE PYELOGRAM;  Surgeon: Nickie Retort, MD;  Location: [facility];  Service: Urology;  Laterality: Left;  . CYSTOSCOPY W/ URETERAL STENT PLACEMENT Left 05/12/2017   Procedure: CYSTOSCOPY WITH STENT REMOVAL;  Surgeon: Nickie Retort, MD;  Location: [facility];  Service: Urology;  Laterality: Left;  . CYSTOSCOPY WITH STENT PLACEMENT Left 04/11/2017   Procedure: CYSTOSCOPY WITH STENT PLACEMENT;  Surgeon: Festus Aloe, MD;  Location: [facility];  Service: Urology;  Laterality: Left;  . URETEROSCOPY  05/12/2017   Procedure: URETEROSCOPY;  Surgeon: Nickie Retort, MD;  Location: [facility];  Service: Urology;;    FAMILY HISTORY: Reported history of ovarian and lung cancer. Diabetes, hypertension.     ADVANCED DIRECTIVES:    HEALTH MAINTENANCE: Social History   Tobacco Use  . Smoking status: Former Research scientist (life sciences)  . Smokeless tobacco: Never Used  . Tobacco comment: quit in Feb of 1997  Substance Use Topics  . Alcohol use: No  . Drug use: No     Colonoscopy:  PAP:  Bone density:  Lipid panel:  Allergies  Allergen Reactions  . Celecoxib Other (See Comments)    Increased Blood Pressure  . Chlorpromazine Nausea Only  . Prednisone Other (See Comments)    Diabetic  . Amoxicillin-Pot  Clavulanate Rash    Has patient had a PCN reaction causing immediate rash, facial/tongue/throat swelling, SOB or lightheadedness with hypotension: No Has patient had a PCN reaction causing severe rash involving mucus membranes or skin necrosis: No Has patient had a PCN reaction that required  hospitalization: No Has patient had a PCN reaction occurring within the last 10 years: Yes If all of the above answers are "NO", then may proceed with Cephalosporin use.   Marland Kitchen Penicillin V Potassium Rash    Current Outpatient Medications  Medication Sig Dispense Refill  . Aflibercept (EYLEA) 2 MG/0.05ML SOLN Inject 1 Dose into the eye as directed. One injection into left eye every 8-9 weeks    . allopurinol (ZYLOPRIM) 100 MG tablet Take 100 mg by mouth daily.    Marland Kitchen atorvastatin (LIPITOR) 10 MG tablet Take 10 mg by mouth at bedtime.     . calcipotriene-betamethasone (TACLONEX) ointment Apply 1 application topically as needed (psoriasis).    . carvedilol (COREG) 12.5 MG tablet Take 12.5 mg by mouth 2 (two) times daily with a meal.    . Cholecalciferol (VITAMIN D-3) 5000 units TABS Take 5,000 Units by mouth at bedtime.     . clonazePAM (KLONOPIN) 1 MG tablet Take 1 mg by mouth at bedtime.     . cyanocobalamin 500 MCG tablet Take 1,000 mcg by mouth at bedtime.     . fexofenadine (ALLEGRA) 180 MG tablet Take 180 mg by mouth daily as needed for allergies.     . finasteride (PROSCAR) 5 MG tablet Take 1 tablet (5 mg total) by mouth daily. 90 tablet 2  . ibuprofen (ADVIL,MOTRIN) 200 MG tablet Take 400 mg by mouth every 6 (six) hours as needed for mild pain.    Marland Kitchen insulin glargine (LANTUS) 100 UNIT/ML injection Inject 50 Units into the skin daily.     Marland Kitchen LORazepam (ATIVAN) 1 MG tablet Take 1 mg by mouth every 8 (eight) hours as needed for anxiety (takes 1 tablet every morning.Rarely has to take again during the day).     . Multiple Vitamins-Minerals (PRESERVISION AREDS 2 PO) Take 1 capsule by mouth 2 (two) times daily.     Marland Kitchen omega-3 acid ethyl esters (LOVAZA) 1 g capsule Take by mouth 2 (two) times daily.    . tamsulosin (FLOMAX) 0.4 MG CAPS capsule Take 1 capsule (0.4 mg total) by mouth daily after supper. 90 capsule 2  . telmisartan (MICARDIS) 80 MG tablet Take 80 mg by mouth daily.    Marland Kitchen triamcinolone  (NASACORT) 55 MCG/ACT AERO nasal inhaler Place 2 sprays into the nose 2 (two) times daily as needed (allergies).      No current facility-administered medications for this visit.    OBJECTIVE: Vitals:   03/31/20 1352  BP: 131/62  Pulse: (!) 57  Resp: 18  Temp: (!) 97.1 F (36.2 C)     Body mass index is 32.84 kg/m.    ECOG FS:0 - Asymptomatic  General: Well-developed, well-nourished, no acute distress. Eyes: Pink conjunctiva, anicteric sclera. HEENT: Normocephalic, moist mucous membranes. Lungs: No audible wheezing or coughing. Heart: Regular rate and rhythm. Abdomen: Soft, nontender, no obvious distention. Musculoskeletal: No edema, cyanosis, or clubbing. Neuro: Alert, answering all questions appropriately. Cranial nerves grossly intact. Skin: No rashes or petechiae noted. Psych: Normal affect.   LAB RESULTS:  Lab Results  Component Value Date   NA 140 11/19/2019   K 3.9 11/19/2019   CL 109 11/19/2019   CO2 21 (L) 11/19/2019   GLUCOSE  159 (H) 11/19/2019   BUN 33 (H) 11/19/2019   CREATININE 1.89 (H) 11/19/2019   CALCIUM 9.6 11/19/2019   PROT 6.1 (L) 04/09/2017   ALBUMIN 2.5 (L) 04/17/2017   AST 22 04/09/2017   ALT 19 04/09/2017   ALKPHOS 76 04/09/2017   BILITOT 0.9 04/09/2017   GFRNONAA 32 (L) 11/19/2019   GFRAA 37 (L) 11/19/2019    Lab Results  Component Value Date   WBC 16.7 (H) 03/31/2020   NEUTROABS 2.7 03/31/2020   HGB 9.6 (L) 03/31/2020   HCT 29.3 (L) 03/31/2020   MCV 100.0 03/31/2020   PLT 53 (L) 03/31/2020     STUDIES: No results found.  ASSESSMENT: Rai stage I CLL.  PLAN:    1. CLL: Patient's white blood cell count remains mildly elevated at 16.7, but essentially unchanged for greater than 10 years.  He also has a chronic thrombocytopenia that is also unchanged.  Continue simple observation.  Patient has a CBC monitored several times per year by his primary care physician as well.  Return to clinic in 1 year for repeat laboratory work and  further evaluation. 2. Thrombocytopenia: Chronic and unchanged for greater than 10 years.  Platelet count is 53.  Patient does not require bone marrow biopsy.  Follow-up as above.   3.  Chronic renal insufficiency: Chronic and unchanged.  Continue monitoring and treatment per nephrology.    4. Lymphadenopathy: CT scan results from April 09, 2017 reviewed independently with enlarged pelvic and retroperitoneal lymph nodes. These are also noted on a scan in 2012 appear to be slightly larger. No intervention is needed. These are likely secondary to patient's underlying CLL.  No further imaging is necessary unless there is suspicion of progression of disease.  Return to clinic in 1 year as above.   Patient expressed understanding and was in agreement with this plan. He also understands that He can call clinic at any time with any questions, concerns, or complaints.   Lloyd Huger, MD   04/01/2020 3:35 PM

## 2020-03-30 ENCOUNTER — Encounter: Payer: Self-pay | Admitting: Oncology

## 2020-03-30 NOTE — Progress Notes (Signed)
Patient prescreened for appointment. Patient has no concerns or questions.  

## 2020-03-31 ENCOUNTER — Other Ambulatory Visit: Payer: Self-pay

## 2020-03-31 ENCOUNTER — Inpatient Hospital Stay: Payer: Medicare Other | Attending: Oncology

## 2020-03-31 ENCOUNTER — Inpatient Hospital Stay: Payer: Medicare Other | Admitting: Oncology

## 2020-03-31 VITALS — BP 131/62 | HR 57 | Temp 97.1°F | Resp 18 | Wt 222.4 lb

## 2020-03-31 DIAGNOSIS — Z791 Long term (current) use of non-steroidal anti-inflammatories (NSAID): Secondary | ICD-10-CM | POA: Insufficient documentation

## 2020-03-31 DIAGNOSIS — G4730 Sleep apnea, unspecified: Secondary | ICD-10-CM | POA: Diagnosis not present

## 2020-03-31 DIAGNOSIS — Z87891 Personal history of nicotine dependence: Secondary | ICD-10-CM | POA: Diagnosis not present

## 2020-03-31 DIAGNOSIS — Z794 Long term (current) use of insulin: Secondary | ICD-10-CM | POA: Diagnosis not present

## 2020-03-31 DIAGNOSIS — F419 Anxiety disorder, unspecified: Secondary | ICD-10-CM | POA: Diagnosis not present

## 2020-03-31 DIAGNOSIS — Q613 Polycystic kidney, unspecified: Secondary | ICD-10-CM | POA: Diagnosis not present

## 2020-03-31 DIAGNOSIS — I10 Essential (primary) hypertension: Secondary | ICD-10-CM | POA: Diagnosis not present

## 2020-03-31 DIAGNOSIS — E119 Type 2 diabetes mellitus without complications: Secondary | ICD-10-CM | POA: Diagnosis not present

## 2020-03-31 DIAGNOSIS — N183 Chronic kidney disease, stage 3 unspecified: Secondary | ICD-10-CM | POA: Diagnosis not present

## 2020-03-31 DIAGNOSIS — Z79899 Other long term (current) drug therapy: Secondary | ICD-10-CM | POA: Diagnosis not present

## 2020-03-31 DIAGNOSIS — C9110 Chronic lymphocytic leukemia of B-cell type not having achieved remission: Secondary | ICD-10-CM | POA: Insufficient documentation

## 2020-03-31 DIAGNOSIS — D696 Thrombocytopenia, unspecified: Secondary | ICD-10-CM | POA: Diagnosis not present

## 2020-03-31 DIAGNOSIS — E785 Hyperlipidemia, unspecified: Secondary | ICD-10-CM | POA: Diagnosis not present

## 2020-03-31 LAB — CBC WITH DIFFERENTIAL/PLATELET
Abs Immature Granulocytes: 0.02 10*3/uL (ref 0.00–0.07)
Basophils Absolute: 0 10*3/uL (ref 0.0–0.1)
Basophils Relative: 0 %
Eosinophils Absolute: 0.2 10*3/uL (ref 0.0–0.5)
Eosinophils Relative: 1 %
HCT: 29.3 % — ABNORMAL LOW (ref 39.0–52.0)
Hemoglobin: 9.6 g/dL — ABNORMAL LOW (ref 13.0–17.0)
Immature Granulocytes: 0 %
Lymphocytes Relative: 73 %
Lymphs Abs: 12.2 10*3/uL — ABNORMAL HIGH (ref 0.7–4.0)
MCH: 32.8 pg (ref 26.0–34.0)
MCHC: 32.8 g/dL (ref 30.0–36.0)
MCV: 100 fL (ref 80.0–100.0)
Monocytes Absolute: 1.6 10*3/uL — ABNORMAL HIGH (ref 0.1–1.0)
Monocytes Relative: 10 %
Neutro Abs: 2.7 10*3/uL (ref 1.7–7.7)
Neutrophils Relative %: 16 %
Platelets: 53 10*3/uL — ABNORMAL LOW (ref 150–400)
RBC: 2.93 MIL/uL — ABNORMAL LOW (ref 4.22–5.81)
RDW: 13.9 % (ref 11.5–15.5)
Smear Review: NORMAL
WBC: 16.7 10*3/uL — ABNORMAL HIGH (ref 4.0–10.5)
nRBC: 0 % (ref 0.0–0.2)

## 2020-05-08 ENCOUNTER — Ambulatory Visit (INDEPENDENT_AMBULATORY_CARE_PROVIDER_SITE_OTHER): Payer: Medicare Other

## 2020-05-08 ENCOUNTER — Other Ambulatory Visit: Payer: Self-pay

## 2020-05-08 ENCOUNTER — Encounter (INDEPENDENT_AMBULATORY_CARE_PROVIDER_SITE_OTHER): Payer: Self-pay | Admitting: Vascular Surgery

## 2020-05-08 ENCOUNTER — Ambulatory Visit (INDEPENDENT_AMBULATORY_CARE_PROVIDER_SITE_OTHER): Payer: Medicare Other | Admitting: Vascular Surgery

## 2020-05-08 VITALS — BP 125/68 | HR 61 | Resp 16 | Wt 222.4 lb

## 2020-05-08 DIAGNOSIS — E119 Type 2 diabetes mellitus without complications: Secondary | ICD-10-CM | POA: Diagnosis not present

## 2020-05-08 DIAGNOSIS — I714 Abdominal aortic aneurysm, without rupture, unspecified: Secondary | ICD-10-CM

## 2020-05-08 DIAGNOSIS — I10 Essential (primary) hypertension: Secondary | ICD-10-CM | POA: Diagnosis not present

## 2020-05-08 DIAGNOSIS — N183 Chronic kidney disease, stage 3 unspecified: Secondary | ICD-10-CM

## 2020-05-08 NOTE — Assessment & Plan Note (Signed)
Avoid contrast with CT scan unless size appears to be in the range to need repair.

## 2020-05-08 NOTE — Progress Notes (Signed)
MRN : [id number]  Doron Shake Taplin Brooke Bonito. is a 86 y.o. (Sep 05, 1934) male who presents with chief complaint of  Chief Complaint  Patient presents with   Follow-up    ultrasound follow up  .  History of Present Illness: Patient returns today in follow up of his abdominal aortic aneurysm.  He has been following this for over a decade.  He has no current aneurysm related symptoms. Specifically, the patient denies new back or abdominal pain, or signs of peripheral embolization.  Aortic duplex today measures 4 cm in maximal diameter which is on line with the study from a year ago and smaller than the increased size of 4.4 cm seen 6 months ago.  Current Outpatient Medications  Medication Sig Dispense Refill   Aflibercept (EYLEA) 2 MG/0.05ML SOLN Inject 1 Dose into the eye as directed. One injection into left eye every 8-9 weeks     allopurinol (ZYLOPRIM) 100 MG tablet Take 100 mg by mouth daily.     atorvastatin (LIPITOR) 10 MG tablet Take 10 mg by mouth at bedtime.      calcipotriene-betamethasone (TACLONEX) ointment Apply 1 application topically as needed (psoriasis).     carvedilol (COREG) 12.5 MG tablet Take 12.5 mg by mouth 2 (two) times daily with a meal.     Cholecalciferol (VITAMIN D-3) 5000 units TABS Take 5,000 Units by mouth at bedtime.      clonazePAM (KLONOPIN) 1 MG tablet Take 1 mg by mouth at bedtime.      cyanocobalamin 500 MCG tablet Take 1,000 mcg by mouth at bedtime.      fexofenadine (ALLEGRA) 180 MG tablet Take 180 mg by mouth daily as needed for allergies.      finasteride (PROSCAR) 5 MG tablet Take 1 tablet (5 mg total) by mouth daily. 90 tablet 2   ibuprofen (ADVIL,MOTRIN) 200 MG tablet Take 400 mg by mouth every 6 (six) hours as needed for mild pain.     insulin glargine (LANTUS) 100 UNIT/ML injection Inject 58 Units into the skin daily.      LORazepam (ATIVAN) 1 MG tablet Take 1 mg by mouth every 8 (eight) hours as needed for anxiety (takes 1 tablet every  morning.Rarely has to take again during the day).      Multiple Vitamins-Minerals (PRESERVISION AREDS 2 PO) Take 1 capsule by mouth 2 (two) times daily.      omega-3 acid ethyl esters (LOVAZA) 1 g capsule Take by mouth 2 (two) times daily.     tamsulosin (FLOMAX) 0.4 MG CAPS capsule Take 1 capsule (0.4 mg total) by mouth daily after supper. 90 capsule 2   telmisartan (MICARDIS) 80 MG tablet Take 80 mg by mouth daily.     triamcinolone (NASACORT) 55 MCG/ACT AERO nasal inhaler Place 2 sprays into the nose 2 (two) times daily as needed (allergies).      No current facility-administered medications for this visit.    Past Medical History:  Diagnosis Date   Anxiety    CKD (chronic kidney disease), stage III    CLL (chronic lymphocytic leukemia) ([place])    Diabetes mellitus without complication ([place])    History of kidney stones    HLD (hyperlipidemia)    HTN (hypertension)    PKD (polycystic kidney disease)    Sleep apnea     Past Surgical History:  Procedure Laterality Date   CARDIAC CATHETERIZATION  12/1987   CENTRAL LINE INSERTION N/A 04/11/2017   Procedure: Central Line Insertion;  Surgeon: Hortencia Pilar  G, MD;  Location: [facility];  Service: Cardiovascular;  Laterality: N/A;   CERVICAL FUSION     CHOLECYSTECTOMY     CYSTOSCOPY W/ RETROGRADES Left 05/12/2017   Procedure: CYSTOSCOPY WITH RETROGRADE PYELOGRAM;  Surgeon: Nickie Retort, MD;  Location: [facility];  Service: Urology;  Laterality: Left;   CYSTOSCOPY W/ URETERAL STENT PLACEMENT Left 05/12/2017   Procedure: CYSTOSCOPY WITH STENT REMOVAL;  Surgeon: Nickie Retort, MD;  Location: [facility];  Service: Urology;  Laterality: Left;   CYSTOSCOPY WITH STENT PLACEMENT Left 04/11/2017   Procedure: CYSTOSCOPY WITH STENT PLACEMENT;  Surgeon: Festus Aloe, MD;  Location: [facility];  Service: Urology;  Laterality: Left;   URETEROSCOPY  05/12/2017   Procedure: URETEROSCOPY;  Surgeon: Nickie Retort, MD;  Location: [facility];  Service: Urology;;     Social History   Tobacco Use   Smoking status: Former Smoker   Smokeless tobacco: Never Used   Tobacco comment: quit in Feb of 1997  Substance Use Topics   Alcohol use: No   Drug use: No      Family History  Problem Relation Age of Onset   Cancer Mother    Varicose Veins Mother    Cancer Father      Allergies  Allergen Reactions   Celecoxib Other (See Comments)    Increased Blood Pressure   Chlorpromazine Nausea Only   Prednisone Other (See Comments)    Diabetic   Amoxicillin-Pot Clavulanate Rash    Has patient had a PCN reaction causing immediate rash, facial/tongue/throat swelling, SOB or lightheadedness with hypotension: No Has patient had a PCN reaction causing severe rash involving mucus membranes or skin necrosis: No Has patient had a PCN reaction that required hospitalization: No Has patient had a PCN reaction occurring within the last 10 years: Yes If all of the above answers are "NO", then may proceed with Cephalosporin use.    Penicillin V Potassium Rash     REVIEW OF SYSTEMS (Negative unless checked)  Constitutional: [] Weight loss  [] Fever  [] Chills Cardiac: [] Chest pain   [] Chest pressure   [] Palpitations   [] Shortness of breath when laying flat   [] Shortness of breath at rest   [] Shortness of breath with exertion. Vascular:  [] Pain in legs with walking   [] Pain in legs at rest   [] Pain in legs when laying flat   [] Claudication   [] Pain in feet when walking  [] Pain in feet at rest  [] Pain in feet when laying flat   [] History of DVT   [] Phlebitis   [] Swelling in legs   [] Varicose veins   [] Non-healing ulcers Pulmonary:   [] Uses home oxygen   [] Productive cough   [] Hemoptysis   [] Wheeze  [] COPD   [] Asthma Neurologic:  [] Dizziness  [] Blackouts   [] Seizures   [] History of stroke   [] History of TIA  [] Aphasia   [] Temporary blindness   [] Dysphagia   [] Weakness or numbness in arms   [] Weakness  or numbness in legs Musculoskeletal:  [x] Arthritis   [] Joint swelling   [x] Joint pain   [] Low back pain Hematologic:  [] Easy bruising  [] Easy bleeding   [] Hypercoagulable state   [] Anemic   Gastrointestinal:  [] Blood in stool   [] Vomiting blood  [] Gastroesophageal reflux/heartburn   [] Abdominal pain Genitourinary:  [x] Chronic kidney disease   [] Difficult urination  [] Frequent urination  [] Burning with urination   [] Hematuria Skin:  [] Rashes   [] Ulcers   [] Wounds Psychological:  [x] History of anxiety   []  History of major  depression.  Physical Examination  BP 125/68 (BP Location: Right Arm)    Pulse 61    Resp 16    Wt (!) 222 lb 6.4 oz (100.9 kg)    BMI 32.84 kg/m  Gen:  WD/WN, NAD Head: [place]/AT, No temporalis wasting. Ear/Nose/Throat: Hearing grossly intact, nares w/o erythema or drainage Eyes: Conjunctiva clear. Sclera non-icteric Neck: Supple.  Trachea midline Pulmonary:  Good air movement, no use of accessory muscles.  Cardiac: RRR, no JVD Vascular:  Vessel Right Left  Radial Palpable Palpable                                   Gastrointestinal: soft, non-tender/non-distended.  Aorta not easily palpable due to body habitus Musculoskeletal: M/S 5/5 throughout.  No deformity or atrophy.  Trace lower extremity edema. Neurologic: Sensation grossly intact in extremities.  Symmetrical.  Speech is fluent.  Psychiatric: Judgment intact, Mood & affect appropriate for pt's clinical situation. Dermatologic: No rashes or ulcers noted.  No cellulitis or open wounds.       Labs Recent Results (from the past 2160 hour(s))  CBC with Differential     Status: Abnormal   Collection Time: 03/31/20  1:32 PM  Result Value Ref Range   WBC 16.7 (H) 4.0 - 10.5 K/uL   RBC 2.93 (L) 4.22 - 5.81 MIL/uL   Hemoglobin 9.6 (L) 13.0 - 17.0 g/dL   HCT 29.3 (L) 39 - 52 %   MCV 100.0 80.0 - 100.0 fL   MCH 32.8 26.0 - 34.0 pg   MCHC 32.8 30.0 - 36.0 g/dL   RDW 13.9 11.5 - 15.5 %   Platelets 53 (L)  150 - 400 K/uL    Comment: SPECIMEN CHECKED FOR CLOTS Immature Platelet Fraction may be clinically indicated, consider ordering this additional test [id number]    nRBC 0.0 0.0 - 0.2 %   Neutrophils Relative % 16 %   Neutro Abs 2.7 1.7 - 7.7 K/uL   Lymphocytes Relative 73 %   Lymphs Abs 12.2 (H) 0.7 - 4.0 K/uL   Monocytes Relative 10 %   Monocytes Absolute 1.6 (H) 0 - 1 K/uL   Eosinophils Relative 1 %   Eosinophils Absolute 0.2 0 - 0 K/uL   Basophils Relative 0 %   Basophils Absolute 0.0 0 - 0 K/uL   WBC Morphology CONSISTENT WITH KNOWN CLL    Smear Review Normal platelet morphology     Comment: PLATELETS APPEAR DECREASED   Immature Granulocytes 0 %   Abs Immature Granulocytes 0.02 0.00 - 0.07 K/uL   Ovalocytes PRESENT     Comment: Performed at [facility], [address], [place], [place] [address]    Radiology No results found.  Assessment/Plan Diabetes mellitus ([place]) blood glucose control important in reducing the progression of atherosclerotic disease. Also, involved in wound healing. On appropriate medications.   BP (high blood pressure) blood pressure control important in reducing the progression of atherosclerotic diseaseand aneurysmal disease. On appropriate oral medications.  Chronic kidney disease (CKD), stage III (moderate) Avoid contrast with CT scan unless size appears to be in the range to need repair.  Abdominal aortic aneurysm (AAA) ([facility]) Aortic duplex today measures 4 cm in maximal diameter which is on line with the study from a year ago and smaller than the increased size of 4.4 cm seen 6 months ago.  No role for intervention at this size.  Plan to recheck  in 6 months with duplex.  Contact our office with any problems in the interim.    Leotis Pain, MD  05/08/2020 10:31 AM    This note was created with Dragon medical transcription system.  Any errors from dictation are purely unintentional

## 2020-05-08 NOTE — Assessment & Plan Note (Signed)
Aortic duplex today measures 4 cm in maximal diameter which is on line with the study from a year ago and smaller than the increased size of 4.4 cm seen 6 months ago.  No role for intervention at this size.  Plan to recheck in 6 months with duplex.  Contact our office with any problems in the interim.

## 2020-06-22 ENCOUNTER — Ambulatory Visit: Payer: Medicare Other | Admitting: Urology

## 2020-06-24 ENCOUNTER — Ambulatory Visit: Payer: Medicare Other | Admitting: Urology

## 2020-06-24 ENCOUNTER — Encounter: Payer: Self-pay | Admitting: Urology

## 2020-06-24 ENCOUNTER — Other Ambulatory Visit: Payer: Self-pay

## 2020-06-24 VITALS — BP 144/71 | HR 71 | Ht 67.0 in | Wt 221.0 lb

## 2020-06-24 DIAGNOSIS — IMO0002 Reserved for concepts with insufficient information to code with codable children: Secondary | ICD-10-CM

## 2020-06-24 DIAGNOSIS — Q600 Renal agenesis, unilateral: Secondary | ICD-10-CM

## 2020-06-24 DIAGNOSIS — N401 Enlarged prostate with lower urinary tract symptoms: Secondary | ICD-10-CM

## 2020-06-24 LAB — BLADDER SCAN AMB NON-IMAGING

## 2020-06-24 NOTE — Progress Notes (Signed)
   06/24/2020 4:47 PM   Patrick Altes Hanger Jr. 08-07-1934 [id number]  Reason for visit: Follow up functionally solitary left kidney, BPH  HPI: I saw Patrick Payne back in urology clinic today for the above issues. He is an 86 year old male with interesting urologic history. He had acute onset of renal failure in July 2018 and was found to have left hydronephrosis and a functionally solitary left kidney, with severe polycystic disease of the right kidney.He underwent ureteral stent placement which improved his renal function, and ultimately underwent negative ureteroscopy and hydronephrosis resolved.  He had a cystoscopy with an outside urologist a few days prior to developing the hydronephrosis, and he feels he developed an infection from this causing the hydronephrosis originally.  Afollow-up ultrasound 06/09/2017 showed resolution of his hydronephrosis after stent removal.Since this he has been doing very well and denies any episodes of flank pain or gross hematuria. His creatinine is relatively stable at 1.9 from a baseline of approximately 2.   Regarding his BPH, he is doing very well and denies any complaints today.  He remains on maximal medical therapy.  IPSS score is 0, with quality of life pleased.  PVR is normal at 10 mL.  RTC 1 year for PVR Flomax and finasteride refilled, alternatives discussed   [name], MD  [facility] [address] [place], [place] [address] [phone]

## 2020-06-26 ENCOUNTER — Ambulatory Visit: Payer: Self-pay | Attending: Internal Medicine

## 2020-06-26 DIAGNOSIS — Z23 Encounter for immunization: Secondary | ICD-10-CM

## 2020-06-26 NOTE — Progress Notes (Signed)
   [facility]  Name:  Patrick Payne.    MRN: [id number] DOB: 30-Apr-1934  06/26/2020  [name] was observed post Covid-19 immunization for 15 minutes without incident. He was provided with Vaccine Information Sheet and instruction to access the V-Safe system.   Patrick Payne was instructed to call 911 with any severe reactions post vaccine: Marland Kitchen Difficulty breathing  . Swelling of face and throat  . A fast heartbeat  . A bad rash all over body  . Dizziness and weakness

## 2020-07-08 ENCOUNTER — Other Ambulatory Visit: Payer: Self-pay | Admitting: Urology

## 2020-07-08 DIAGNOSIS — N401 Enlarged prostate with lower urinary tract symptoms: Secondary | ICD-10-CM

## 2020-10-12 ENCOUNTER — Telehealth (INDEPENDENT_AMBULATORY_CARE_PROVIDER_SITE_OTHER): Payer: Self-pay

## 2020-10-12 NOTE — Telephone Encounter (Signed)
Pt called and left a VM on the nurses line and wanted to make [place] aware that on his appointment on Jan 14 th for his U/S for aneurysm  And to see  Dr. Wyn Quaker he also  Was dx   With carotid plaque with no restriction of blood flow by Dr. Judithann Sheen.

## 2020-10-23 ENCOUNTER — Ambulatory Visit (INDEPENDENT_AMBULATORY_CARE_PROVIDER_SITE_OTHER): Payer: Medicare Other | Admitting: Vascular Surgery

## 2020-10-23 ENCOUNTER — Ambulatory Visit (INDEPENDENT_AMBULATORY_CARE_PROVIDER_SITE_OTHER): Payer: Medicare Other

## 2020-10-23 ENCOUNTER — Other Ambulatory Visit: Payer: Self-pay

## 2020-10-23 ENCOUNTER — Encounter (INDEPENDENT_AMBULATORY_CARE_PROVIDER_SITE_OTHER): Payer: Self-pay | Admitting: Vascular Surgery

## 2020-10-23 VITALS — BP 134/71 | HR 62 | Ht 65.0 in | Wt 216.0 lb

## 2020-10-23 DIAGNOSIS — N183 Chronic kidney disease, stage 3 unspecified: Secondary | ICD-10-CM | POA: Diagnosis not present

## 2020-10-23 DIAGNOSIS — E119 Type 2 diabetes mellitus without complications: Secondary | ICD-10-CM | POA: Diagnosis not present

## 2020-10-23 DIAGNOSIS — I714 Abdominal aortic aneurysm, without rupture, unspecified: Secondary | ICD-10-CM

## 2020-10-23 DIAGNOSIS — I6523 Occlusion and stenosis of bilateral carotid arteries: Secondary | ICD-10-CM

## 2020-10-23 DIAGNOSIS — I10 Essential (primary) hypertension: Secondary | ICD-10-CM

## 2020-10-23 DIAGNOSIS — I6529 Occlusion and stenosis of unspecified carotid artery: Secondary | ICD-10-CM | POA: Insufficient documentation

## 2020-10-23 NOTE — Assessment & Plan Note (Signed)
carotid duplex which demonstrated significant carotid plaque bilaterally without hemodynamically significant stenosis.  No role for intervention for this but would plan to check another carotid duplex when he returns for his aortic study in 6 months.

## 2020-10-23 NOTE — Patient Instructions (Signed)
Abdominal Aortic Aneurysm  An abdominal aortic aneurysm (AAA) is an aneurysm that occurs in the lower part of the aorta. The aorta is the main artery of the body, and it supplies blood from the heart to the rest of the body. An aneurysm is a bulge in an artery. An aneurysm happens when blood pushes against a weakened or damaged artery wall. Most aneurysms do not cause symptoms, but some do cause problems. An AAA can cause two serious problems:  It can enlarge and burst.  It can cause blood to flow between the layers of the wall of the aorta through a tear (aortic dissection). These problems are medical emergencies. They can cause bleeding inside the body. If they are not diagnosed and treated right away, they can be life-threatening. What are the causes? The exact cause of this condition is not known. What increases the risk? The following factors may make you more likely to develop this condition:  Being male and 59 years of age or older.  Being of [place] descent.  Using or having used nicotine or tobacco products.  Having a family history of aneurysms.  Having any of these conditions: ? Hardening of your arteries (arteriosclerosis). ? Inflammation of the walls of an artery (arteritis). ? Certain genetic conditions. ? Obesity. ? An infection in the wall of your aorta (infectious aortitis) caused by bacteria. ? High cholesterol. ? High blood pressure (hypertension). What are the signs or symptoms? Symptoms of this condition vary depending on the size of your aneurysm and how fast it is growing. Most aneurysms grow slowly and do not cause symptoms. When symptoms do occur, they may include:  Severe pain in your abdomen, side, or lower back.  Feeling full after eating only small amounts of food.  Feeling a throbbing lump in your abdomen.  Painful feet or toes, or discolored skin or sores on feet or toes.  Constipation or trouble urinating. Symptoms of an AAA that has  burst include:  Severe pain in your abdomen, side, or back that comes on suddenly.  Nausea or vomiting.  Feeling light-headed or fainting. How is this diagnosed? This condition may be diagnosed with:  A physical exam to check for throbbing and to listen to blood flow in your abdomen.  Tests, such as: ? Ultrasound. ? X-rays. ? CT scan. ? MRI. ? Angiograms. These tests check your arteries for damage or blockage. Because most AAAs that have not burst do not cause symptoms, they are often found during exams for other conditions. How is this treated? Treatment for this condition depends on:  The size of your aneurysm.  How fast your aneurysm is growing.  Your age.  Risk factors for a burst AAA. If your aneurysm is smaller than 2 inches (5 cm), your health care provider may:  Monitor it regularly to see if it is getting bigger. Depending on the size of the aneurysm, how fast it is growing, and your other risk factors, you may have an ultrasound to monitor it every 3-6 months, every year, or every few years.  Give you medicines to control blood pressure, treat pain, or fight infection. If your aneurysm is larger than 2 inches (5 cm), your health care provider may repair it with surgery. Follow these instructions at home: Eating and drinking  Eat a heart-healthy diet. This includes plenty of fresh fruits and vegetables, whole grains, low-fat (lean) protein, and low-fat dairy products.  Avoid foods that are high in saturated fat and cholesterol, such  as red meat and some dairy products.   Lifestyle  Do not use any products that contain nicotine or tobacco, such as cigarettes, e-cigarettes, and chewing tobacco. If you need help quitting, ask your health care provider.  Stay physically active and exercise regularly. Talk with your health care provider about how often to exercise and which types of exercise are safe for you.  Maintain a healthy weight.      Alcohol use  Do not  drink alcohol if: ? Your health care provider tells you not to drink. ? You are pregnant, may be pregnant, or are planning to become pregnant.  If you drink alcohol: ? Limit how much you use to:  0-1 drink a day for women.  0-2 drinks a day for men. ? Be aware of how much alcohol is in your drink. In the [place] one drink equals one 12 oz bottle of beer (355 mL), one 5 oz glass of wine (148 mL), or one 1 oz glass of hard liquor (44 mL). General instructions  Take over-the-counter and prescription medicines only as told by your health care provider.  Keep your blood pressure within a normal range. Check it regularly, and ask your health care provider what your target blood pressure should be.  Have your blood sugar (glucose) level and cholesterol levels checked regularly. Follow instructions on how to keep levels within normal limits.  Avoid heavy lifting and activities that take a lot of effort. Ask what activities are safe for you.  If you can, learn your family's health history.  Keep all follow-up visits as told by your health care provider. This is important. Contact a health care provider if you have:  Pain in your abdomen, side, or back.  Throbbing in your abdomen.  A fever. Get help right away if:  You have sudden, severe pain in your abdomen, side, or back.  You experience nausea or vomiting.  You feel light-headed or you faint.  Your heart beats fast when you stand.  You have sweaty, clammy skin.  You have shortness of breath.  You have constipation or trouble urinating. These symptoms may represent a serious problem that is an emergency. Do not wait to see if the symptoms will go away. Get medical help right away. Call your local emergency services (911 in the [place] Do not drive yourself to the hospital. Summary  An aneurysm is a bulge in an artery. An abdominal aortic aneurysm (AAA) is an aneurysm in the lower part of the aorta.  An AAA can cause bleeding  inside the body, and it can be life-threatening.  Risk can increase if you are male, age 60 or older, and of [place] descent, or if you have used nicotine or tobacco products and have a family history of aneurysms.  Get help right away if you have symptoms of a burst AAA. This information is not intended to replace advice given to you by your health care provider. Make sure you discuss any questions you have with your health care provider. Document Revised: 07/12/2019 Document Reviewed: 07/12/2019 Elsevier Patient Education  2021 [employer]  

## 2020-10-23 NOTE — Progress Notes (Signed)
MRN : [id number]  Patrick Payne. is a 87 y.o. (1933-11-29) male who presents with chief complaint of  Chief Complaint  Patient presents with  . Follow-up    U/S  .  History of Present Illness: Patient returns today in follow up of his aneurysm.  He has no clear aneurysm related symptoms.  Specifically, he does not have new back or abdominal pain or signs of peripheral embolization.  His aortic duplex today does show growth of his abdominal aortic aneurysm measuring 4.5 cm in maximal diameter today.  This was previously 4 cm at his last check. He also has had a couple of drops in blood pressure and presyncopal events.  This has led his primary care physician to alter his blood pressure medications which seems to have helped.  He also had a carotid duplex which demonstrated significant carotid plaque bilaterally without hemodynamically significant stenosis.  His primary care physician asked that we follow this as well.  No arm or leg weakness or numbness, no speech or swallowing difficulty.  Current Outpatient Medications  Medication Sig Dispense Refill  . Aflibercept 2 MG/0.05ML SOLN Inject 1 Dose into the eye as directed. One injection into left eye every 8-9 weeks    . allopurinol (ZYLOPRIM) 100 MG tablet Take 100 mg by mouth daily.    Marland Kitchen atorvastatin (LIPITOR) 10 MG tablet Take 10 mg by mouth at bedtime.     . calcipotriene-betamethasone (TACLONEX) ointment Apply 1 application topically as needed (psoriasis).    . carvedilol (COREG) 12.5 MG tablet Take 12.5 mg by mouth 2 (two) times daily with a meal.    . Cholecalciferol (VITAMIN D-3) 5000 units TABS Take 5,000 Units by mouth at bedtime.     . clonazePAM (KLONOPIN) 1 MG tablet Take 1 mg by mouth at bedtime.     . cyanocobalamin 500 MCG tablet Take 1,000 mcg by mouth at bedtime.     . fexofenadine (ALLEGRA) 180 MG tablet Take 180 mg by mouth daily as needed for allergies.     . finasteride (PROSCAR) 5 MG tablet TAKE 1 TABLET(5 MG)  BY MOUTH DAILY 90 tablet 2  . insulin glargine (LANTUS) 100 UNIT/ML injection Inject 58 Units into the skin daily.     Marland Kitchen LORazepam (ATIVAN) 1 MG tablet Take 1 mg by mouth every 8 (eight) hours as needed for anxiety (takes 1 tablet every morning.Rarely has to take again during the day).     . Multiple Vitamins-Minerals (PRESERVISION AREDS 2 PO) Take 1 capsule by mouth 2 (two) times daily.     Marland Kitchen omega-3 acid ethyl esters (LOVAZA) 1 g capsule Take by mouth 2 (two) times daily.    . tamsulosin (FLOMAX) 0.4 MG CAPS capsule TAKE 1 CAPSULE(0.4 MG) BY MOUTH DAILY AFTER SUPPER 90 capsule 2  . telmisartan (MICARDIS) 80 MG tablet Take 80 mg by mouth daily.    Marland Kitchen triamcinolone (NASACORT) 55 MCG/ACT AERO nasal inhaler Place 2 sprays into the nose 2 (two) times daily as needed (allergies).     Marland Kitchen ibuprofen (ADVIL,MOTRIN) 200 MG tablet Take 400 mg by mouth every 6 (six) hours as needed for mild pain. (Patient not taking: Reported on 10/23/2020)     No current facility-administered medications for this visit.    Past Medical History:  Diagnosis Date  . Anxiety   . CKD (chronic kidney disease), stage III ([place])   . CLL (chronic lymphocytic leukemia) ([place])   . Diabetes mellitus without complication ([place])   .  History of kidney stones   . HLD (hyperlipidemia)   . HTN (hypertension)   . PKD (polycystic kidney disease)   . Sleep apnea     Past Surgical History:  Procedure Laterality Date  . CARDIAC CATHETERIZATION  12/1987  . CENTRAL LINE INSERTION N/A 04/11/2017   Procedure: Central Line Insertion;  Surgeon: Delana Meyer Dolores Lory, MD;  Location: [facility];  Service: Cardiovascular;  Laterality: N/A;  . CERVICAL FUSION    . CHOLECYSTECTOMY    . CYSTOSCOPY W/ RETROGRADES Left 05/12/2017   Procedure: CYSTOSCOPY WITH RETROGRADE PYELOGRAM;  Surgeon: Nickie Retort, MD;  Location: [facility];  Service: Urology;  Laterality: Left;  . CYSTOSCOPY W/ URETERAL STENT PLACEMENT Left 05/12/2017   Procedure:  CYSTOSCOPY WITH STENT REMOVAL;  Surgeon: Nickie Retort, MD;  Location: [facility];  Service: Urology;  Laterality: Left;  . CYSTOSCOPY WITH STENT PLACEMENT Left 04/11/2017   Procedure: CYSTOSCOPY WITH STENT PLACEMENT;  Surgeon: Festus Aloe, MD;  Location: [facility];  Service: Urology;  Laterality: Left;  . URETEROSCOPY  05/12/2017   Procedure: URETEROSCOPY;  Surgeon: Nickie Retort, MD;  Location: [facility];  Service: Urology;;     Social History   Tobacco Use  . Smoking status: Former Research scientist (life sciences)  . Smokeless tobacco: Never Used  . Tobacco comment: quit in Feb of 1997  Substance Use Topics  . Alcohol use: No  . Drug use: No      Family History  Problem Relation Age of Onset  . Cancer Mother   . Varicose Veins Mother   . Cancer Father      Allergies  Allergen Reactions  . Celecoxib Other (See Comments)    Increased Blood Pressure  . Chlorpromazine Nausea Only  . Prednisone Other (See Comments)    Diabetic  . Amoxicillin-Pot Clavulanate Rash    Has patient had a PCN reaction causing immediate rash, facial/tongue/throat swelling, SOB or lightheadedness with hypotension: No Has patient had a PCN reaction causing severe rash involving mucus membranes or skin necrosis: No Has patient had a PCN reaction that required hospitalization: No Has patient had a PCN reaction occurring within the last 10 years: Yes If all of the above answers are "NO", then may proceed with Cephalosporin use.   Marland Kitchen Penicillin V Potassium Rash    REVIEW OF SYSTEMS (Negative unless checked)  Constitutional: [] ?Weight loss  [] ?Fever  [] ?Chills Cardiac: [] ?Chest pain   [] ?Chest pressure   [] ?Palpitations   [] ?Shortness of breath when laying flat   [] ?Shortness of breath at rest   [] ?Shortness of breath with exertion. Vascular:  [] ?Pain in legs with walking   [] ?Pain in legs at rest   [] ?Pain in legs when laying flat   [] ?Claudication   [] ?Pain in feet when walking  [] ?Pain in feet at rest  [] ?Pain  in feet when laying flat   [] ?History of DVT   [] ?Phlebitis   [] ?Swelling in legs   [] ?Varicose veins   [] ?Non-healing ulcers Pulmonary:   [] ?Uses home oxygen   [] ?Productive cough   [] ?Hemoptysis   [] ?Wheeze  [] ?COPD   [] ?Asthma Neurologic:  [] ?Dizziness  [] ?Blackouts   [] ?Seizures   [] ?History of stroke   [] ?History of TIA  [] ?Aphasia   [] ?Temporary blindness   [] ?Dysphagia   [] ?Weakness or numbness in arms   [] ?Weakness or numbness in legs Musculoskeletal:  [x] ?Arthritis   [] ?Joint swelling   [x] ?Joint pain   [] ?Low back pain Hematologic:  [] ?Easy bruising  [] ?Easy bleeding   [] ?Hypercoagulable state   [] ?  Anemic   Gastrointestinal:  [] ?Blood in stool   [] ?Vomiting blood  [] ?Gastroesophageal reflux/heartburn   [] ?Abdominal pain Genitourinary:  [x] ?Chronic kidney disease   [] ?Difficult urination  [] ?Frequent urination  [] ?Burning with urination   [] ?Hematuria Skin:  [] ?Rashes   [] ?Ulcers   [] ?Wounds Psychological:  [x] ?History of anxiety   [] ? History of major depression.   Physical Examination  BP 134/71   Pulse 62   Ht 5\' 5"  (1.651 m)   Wt 216 lb (98 kg)   BMI 35.94 kg/m  Gen:  WD/WN, NAD. Appears younger than stated age Head: [place]/AT, No temporalis wasting. Ear/Nose/Throat: Hearing grossly intact, nares w/o erythema or drainage Eyes: Conjunctiva clear. Sclera non-icteric Neck: Supple.  Trachea midline Pulmonary:  Good air movement, no use of accessory muscles.  Cardiac: RRR, no JVD Vascular:  Vessel Right Left  Radial Palpable Palpable                   Gastrointestinal: soft, non-tender/non-distended. No guarding/reflex. Aorta not palpable with body habitus Musculoskeletal: M/S 5/5 throughout.  No deformity or atrophy. No edema. Neurologic: Sensation grossly intact in extremities.  Symmetrical.  Speech is fluent.  Psychiatric: Judgment intact, Mood & affect appropriate for pt's clinical situation. Dermatologic: No rashes or ulcers noted.  No cellulitis or open  wounds.      Labs No results found for this or any previous visit (from the past 2160 hour(s)).  Radiology No results found.  Assessment/Plan Diabetes mellitus ([place]) blood glucose control important in reducing the progression of atherosclerotic disease. Also, involved in wound healing. On appropriate medications.   BP (high blood pressure) blood pressure control important in reducing the progression of atherosclerotic diseaseand aneurysmal disease. On appropriate oral medications.  Chronic kidney disease (CKD), stage III (moderate) Avoid contrast with CT scan unless size appears to be in the range to need repair.  Abdominal aortic aneurysm (AAA) ([place]) His aortic duplex today does show growth of his abdominal aortic aneurysm measuring 4.5 cm in maximal diameter today.  This was previously 4 cm at his last check.  Although the growth can be worrisome, it remains below the threshold for prophylactic repair.  Continue to follow in 29-month intervals for now.  Carotid stenosis carotid duplex which demonstrated significant carotid plaque bilaterally without hemodynamically significant stenosis.  No role for intervention for this but would plan to check another carotid duplex when he returns for his aortic study in 6 months.    Leotis Pain, MD  10/23/2020 12:36 PM    This note was created with Dragon medical transcription system.  Any errors from dictation are purely unintentional

## 2020-10-23 NOTE — Assessment & Plan Note (Signed)
His aortic duplex today does show growth of his abdominal aortic aneurysm measuring 4.5 cm in maximal diameter today.  This was previously 4 cm at his last check.  Although the growth can be worrisome, it remains below the threshold for prophylactic repair.  Continue to follow in 83-month intervals for now.

## 2020-12-29 ENCOUNTER — Other Ambulatory Visit: Payer: Self-pay | Admitting: Internal Medicine

## 2020-12-29 ENCOUNTER — Ambulatory Visit: Payer: Medicare Other | Attending: Internal Medicine

## 2020-12-29 DIAGNOSIS — Z23 Encounter for immunization: Secondary | ICD-10-CM

## 2020-12-29 NOTE — Progress Notes (Signed)
   [facility]  Name:  Patrick Payne.    MRN: [id number] DOB: [birth year]  12/29/2020  Mr. Homann was observed post Covid-19 immunization for 15 minutes without incident. He was provided with Vaccine Information Sheet and instruction to access the V-Safe system.   Mr. Satter was instructed to call 911 with any severe reactions post vaccine: Marland Kitchen Difficulty breathing  . Swelling of face and throat  . A fast heartbeat  . A bad rash all over body  . Dizziness and weakness   Immunizations Administered    Name Date Dose VIS Date Route   PFIZER Comrnaty(Gray TOP) Covid-19 Vaccine 12/29/2020  1:36 PM 0.3 mL 09/17/2020 Intramuscular   Manufacturer: [employer], [employer]   Lot: [id number]   NDC: [phone]

## 2020-12-30 ENCOUNTER — Ambulatory Visit: Payer: Medicare Other

## 2021-02-25 ENCOUNTER — Other Ambulatory Visit: Payer: Self-pay | Admitting: Urology

## 2021-02-25 DIAGNOSIS — N401 Enlarged prostate with lower urinary tract symptoms: Secondary | ICD-10-CM

## 2021-03-26 ENCOUNTER — Other Ambulatory Visit: Payer: Self-pay | Admitting: Nephrology

## 2021-03-26 DIAGNOSIS — N1832 Chronic kidney disease, stage 3b: Secondary | ICD-10-CM

## 2021-03-30 ENCOUNTER — Other Ambulatory Visit: Payer: Self-pay

## 2021-03-30 ENCOUNTER — Ambulatory Visit
Admission: RE | Admit: 2021-03-30 | Discharge: 2021-03-30 | Disposition: A | Discharge status: Home / Self Care | Payer: Medicare Other | Source: Ambulatory Visit | Attending: Nephrology | Admitting: Nephrology

## 2021-03-30 DIAGNOSIS — N1832 Chronic kidney disease, stage 3b: Secondary | ICD-10-CM | POA: Diagnosis present

## 2021-04-05 ENCOUNTER — Inpatient Hospital Stay: Payer: Medicare Other | Attending: Oncology | Admitting: Nurse Practitioner

## 2021-04-05 ENCOUNTER — Inpatient Hospital Stay: Payer: Medicare Other

## 2021-04-05 ENCOUNTER — Encounter: Payer: Self-pay | Admitting: Nurse Practitioner

## 2021-04-05 VITALS — BP 173/82 | HR 62 | Temp 98.0°F | Resp 18 | Wt 222.4 lb

## 2021-04-05 DIAGNOSIS — D649 Anemia, unspecified: Secondary | ICD-10-CM | POA: Diagnosis not present

## 2021-04-05 DIAGNOSIS — N183 Chronic kidney disease, stage 3 unspecified: Secondary | ICD-10-CM | POA: Insufficient documentation

## 2021-04-05 DIAGNOSIS — I10 Essential (primary) hypertension: Secondary | ICD-10-CM | POA: Diagnosis not present

## 2021-04-05 DIAGNOSIS — Z794 Long term (current) use of insulin: Secondary | ICD-10-CM | POA: Insufficient documentation

## 2021-04-05 DIAGNOSIS — C9110 Chronic lymphocytic leukemia of B-cell type not having achieved remission: Secondary | ICD-10-CM

## 2021-04-05 DIAGNOSIS — G4730 Sleep apnea, unspecified: Secondary | ICD-10-CM | POA: Diagnosis not present

## 2021-04-05 DIAGNOSIS — Z79899 Other long term (current) drug therapy: Secondary | ICD-10-CM | POA: Insufficient documentation

## 2021-04-05 DIAGNOSIS — E785 Hyperlipidemia, unspecified: Secondary | ICD-10-CM | POA: Diagnosis not present

## 2021-04-05 DIAGNOSIS — E119 Type 2 diabetes mellitus without complications: Secondary | ICD-10-CM | POA: Diagnosis not present

## 2021-04-05 LAB — CBC WITH DIFFERENTIAL/PLATELET
Abs Immature Granulocytes: 0.02 10*3/uL (ref 0.00–0.07)
Basophils Absolute: 0 10*3/uL (ref 0.0–0.1)
Basophils Relative: 0 %
Eosinophils Absolute: 0.1 10*3/uL (ref 0.0–0.5)
Eosinophils Relative: 1 %
HCT: 28.3 % — ABNORMAL LOW (ref 39.0–52.0)
Hemoglobin: 9.1 g/dL — ABNORMAL LOW (ref 13.0–17.0)
Immature Granulocytes: 0 %
Lymphocytes Relative: 71 %
Lymphs Abs: 9.7 10*3/uL — ABNORMAL HIGH (ref 0.7–4.0)
MCH: 32.9 pg (ref 26.0–34.0)
MCHC: 32.2 g/dL (ref 30.0–36.0)
MCV: 102.2 fL — ABNORMAL HIGH (ref 80.0–100.0)
Monocytes Absolute: 1 10*3/uL (ref 0.1–1.0)
Monocytes Relative: 7 %
Neutro Abs: 2.8 10*3/uL (ref 1.7–7.7)
Neutrophils Relative %: 21 %
Platelets: 47 10*3/uL — ABNORMAL LOW (ref 150–400)
RBC: 2.77 MIL/uL — ABNORMAL LOW (ref 4.22–5.81)
RDW: 14.2 % (ref 11.5–15.5)
Smear Review: NORMAL
WBC: 13.6 10*3/uL — ABNORMAL HIGH (ref 4.0–10.5)
nRBC: 0 % (ref 0.0–0.2)

## 2021-04-05 NOTE — Progress Notes (Signed)
[facility]  Telephone:([phone] [phone]  ID: Patrick Altes Berkery Jr. OB: 1934/07/01  MR#: [id number]  UYQ#:[id number]  Patient Care Team: Idelle Crouch, MD as PCP - General (Internal Medicine) Lloyd Huger, MD as Consulting Physician (Oncology)  CHIEF COMPLAINT: CLL  INTERVAL HISTORY: Patient is 87 year old male who returns to clinic for repeat labs and routine yearly evaluation. He continues to feel well and remains asymptomatic. He denies fevers, night sweats, or unintentional weight loss. No new lumps or bumps. No dizziness or weakness. No chest pain, shortness of breath, cough or hemoptysis. He denies nausea, vomiting, constipation, or diarrhea. N ourinary complaints. No specific complaints today.   REVIEW OF SYSTEMS:   Review of Systems  Constitutional: Negative.  Negative for diaphoresis, fever, malaise/fatigue and weight loss.  Respiratory: Negative.  Negative for cough and shortness of breath.   Cardiovascular: Negative.  Negative for chest pain and leg swelling.  Gastrointestinal: Negative.  Negative for abdominal pain, blood in stool and melena.  Genitourinary: Negative.  Negative for dysuria.  Musculoskeletal: Negative.  Negative for back pain.  Skin: Negative.  Negative for rash.  Neurological: Negative.  Negative for dizziness, sensory change, focal weakness, weakness and headaches.  Psychiatric/Behavioral: Negative.  The patient is not nervous/anxious.   As per HPI. Otherwise, a complete review of systems is negative.  PAST MEDICAL HISTORY: Past Medical History:  Diagnosis Date   Anxiety    CKD (chronic kidney disease), stage III ([facility])    CLL (chronic lymphocytic leukemia) ([place])    Diabetes mellitus without complication ([place])    History of kidney stones    HLD (hyperlipidemia)    HTN (hypertension)    PKD (polycystic kidney disease)    Sleep apnea     PAST SURGICAL HISTORY: Past Surgical History:  Procedure  Laterality Date   CARDIAC CATHETERIZATION  12/1987   CENTRAL LINE INSERTION N/A 04/11/2017   Procedure: Central Line Insertion;  Surgeon: Katha Cabal, MD;  Location: [place] [facility];  Service: Cardiovascular;  Laterality: N/A;   CERVICAL FUSION     CHOLECYSTECTOMY     CYSTOSCOPY W/ RETROGRADES Left 05/12/2017   Procedure: CYSTOSCOPY WITH RETROGRADE PYELOGRAM;  Surgeon: Nickie Retort, MD;  Location: [facility];  Service: Urology;  Laterality: Left;   CYSTOSCOPY W/ URETERAL STENT PLACEMENT Left 05/12/2017   Procedure: CYSTOSCOPY WITH STENT REMOVAL;  Surgeon: Nickie Retort, MD;  Location: [facility];  Service: Urology;  Laterality: Left;   CYSTOSCOPY WITH STENT PLACEMENT Left 04/11/2017   Procedure: CYSTOSCOPY WITH STENT PLACEMENT;  Surgeon: Festus Aloe, MD;  Location: [facility];  Service: Urology;  Laterality: Left;   URETEROSCOPY  05/12/2017   Procedure: URETEROSCOPY;  Surgeon: Nickie Retort, MD;  Location: [facility];  Service: Urology;;    FAMILY HISTORY: Reported history of ovarian and lung cancer. Diabetes, hypertension.     ADVANCED DIRECTIVES:   Social History   Socioeconomic History   Marital status: Married    Spouse name: Not on file   Number of children: Not on file   Years of education: Not on file   Highest education level: Not on file  Occupational History   Occupation: Land    Comment: retired  Tobacco Use   Smoking status: Former    Pack years: 0.00   Smokeless tobacco: Never   Tobacco comments:    quit in Feb of 1997  Substance and Sexual Activity   Alcohol use: No   Drug  use: No   Sexual activity: Not on file  Other Topics Concern   Not on file  Social History Narrative   Worked as a Land, now retired. Went to [employer]. Has children and grand children. Married. [place] daughter is an NP in GU oncology at [employer].    Social Determinants of Health   Financial Resource Strain: Not on file  Food Insecurity:  Not on file  Transportation Needs: Not on file  Physical Activity: Not on file  Stress: Not on file  Social Connections: Not on file     HEALTH MAINTENANCE: Social History   Tobacco Use   Smoking status: Former    Pack years: 0.00   Smokeless tobacco: Never   Tobacco comments:    quit in Feb of 1997  Substance Use Topics   Alcohol use: No   Drug use: No     Colonoscopy:  PAP:  Bone density:  Lipid panel:  Allergies  Allergen Reactions   Celecoxib Other (See Comments)    Increased Blood Pressure   Chlorpromazine Nausea Only   Prednisone Other (See Comments)    Diabetic   Amoxicillin-Pot Clavulanate Rash    Has patient had a PCN reaction causing immediate rash, facial/tongue/throat swelling, SOB or lightheadedness with hypotension: No Has patient had a PCN reaction causing severe rash involving mucus membranes or skin necrosis: No Has patient had a PCN reaction that required hospitalization: No Has patient had a PCN reaction occurring within the last 10 years: Yes If all of the above answers are "NO", then may proceed with Cephalosporin use.    Penicillin V Potassium Rash    Current Outpatient Medications  Medication Sig Dispense Refill   Aflibercept 2 MG/0.05ML SOLN Inject 1 Dose into the eye as directed. One injection into left eye every 8-9 weeks     allopurinol (ZYLOPRIM) 100 MG tablet Take 100 mg by mouth daily.     atorvastatin (LIPITOR) 10 MG tablet Take 10 mg by mouth at bedtime.      carvedilol (COREG) 12.5 MG tablet Take 12.5 mg by mouth 2 (two) times daily with a meal.     Cholecalciferol (VITAMIN D-3) 5000 units TABS Take 5,000 Units by mouth at bedtime.      clonazePAM (KLONOPIN) 1 MG tablet Take 1 mg by mouth at bedtime.      cyanocobalamin 500 MCG tablet Take 1,000 mcg by mouth at bedtime.      empagliflozin (JARDIANCE) 10 MG TABS tablet      fexofenadine (ALLEGRA) 180 MG tablet Take 180 mg by mouth daily as needed for allergies.      finasteride  (PROSCAR) 5 MG tablet TAKE 1 TABLET(5 MG) BY MOUTH DAILY 90 tablet 2   insulin glargine (LANTUS) 100 UNIT/ML injection Inject 58 Units into the skin daily.      LORazepam (ATIVAN) 1 MG tablet Take 1 mg by mouth every 8 (eight) hours as needed for anxiety (takes 1 tablet every morning.  Rarely has to take again during the day).      Multiple Vitamins-Minerals (PRESERVISION AREDS 2 PO) Take 1 capsule by mouth 2 (two) times daily.      omega-3 acid ethyl esters (LOVAZA) 1 g capsule Take by mouth 2 (two) times daily.     tamsulosin (FLOMAX) 0.4 MG CAPS capsule TAKE 1 CAPSULE(0.4 MG) BY MOUTH DAILY AFTER SUPPER 90 capsule 2   telmisartan (MICARDIS) 40 MG tablet Take 40 mg by mouth in the morning and at bedtime.  triamcinolone (NASACORT) 55 MCG/ACT AERO nasal inhaler Place 2 sprays into the nose 2 (two) times daily as needed (allergies).      calcipotriene-betamethasone (TACLONEX) ointment Apply 1 application topically as needed (psoriasis). (Patient not taking: Reported on 04/05/2021)     COVID-19 mRNA Vac-TriS, Pfizer, SUSP injection USE AS DIRECTED (Patient not taking: Reported on 04/05/2021) .3 mL 0   ibuprofen (ADVIL,MOTRIN) 200 MG tablet Take 400 mg by mouth every 6 (six) hours as needed for mild pain. (Patient not taking: No sig reported)     No current facility-administered medications for this visit.    OBJECTIVE: Vitals:   04/05/21 1501  BP: (!) 173/82  Pulse: 62  Resp: 18  Temp: 98 F (36.7 C)     Body mass index is 37.01 kg/m.    ECOG FS:0 - Asymptomatic  General: Well-developed, well-nourished, no acute distress. Eyes: Pink conjunctiva, anicteric sclera. Lymph: No cervical, axillary lymphadenopathy Lungs: Clear to auscultation bilaterally.  No audible wheezing or coughing Heart: Regular rate and rhythm.  Abdomen: Soft, nontender, nondistended.  Musculoskeletal: No edema, cyanosis, or clubbing. Neuro: Alert, answering all questions appropriately. Cranial nerves grossly  intact. Skin: No rashes or petechiae noted. Psych: Normal affect.  LAB RESULTS:  Lab Results  Component Value Date   NA 140 11/19/2019   K 3.9 11/19/2019   CL 109 11/19/2019   CO2 21 (L) 11/19/2019   GLUCOSE 159 (H) 11/19/2019   BUN 33 (H) 11/19/2019   CREATININE 1.89 (H) 11/19/2019   CALCIUM 9.6 11/19/2019   PROT 6.1 (L) 04/09/2017   ALBUMIN 2.5 (L) 04/17/2017   AST 22 04/09/2017   ALT 19 04/09/2017   ALKPHOS 76 04/09/2017   BILITOT 0.9 04/09/2017   GFRNONAA 32 (L) 11/19/2019   GFRAA 37 (L) 11/19/2019    Lab Results  Component Value Date   WBC 13.6 (H) 04/05/2021   NEUTROABS 2.8 04/05/2021   HGB 9.1 (L) 04/05/2021   HCT 28.3 (L) 04/05/2021   MCV 102.2 (H) 04/05/2021   PLT 47 (L) 04/05/2021     STUDIES: US RENAL  Result Date: 03/31/2021 CLINICAL DATA:  Stage 3 chronic kidney disease EXAM: RENAL / URINARY TRACT ULTRASOUND COMPLETE COMPARISON:  06/26/2019, CT 04/09/2017 FINDINGS: Right Kidney: Renal measurements: 23.5 x 12.6 x 15.2 cm = volume: 2346.22 mL. Innumerable renal cysts, measuring up to 5.7 cm. No gross hydronephrosis. Left Kidney: Renal measurements: 12.9 x 6.4 x 5.3 cm = volume: 229.5 mL. Cortical echogenicity within normal limits. No hydronephrosis. Upper pole cyst measuring 15 x 14 x 15 mm. Exophytic midpole cyst measuring 20 x 15 x 22 mm Bladder: Appears normal for degree of bladder distention. Other: None. IMPRESSION: 1. Enlarged polycystic right kidney without hydronephrosis. 2. Several left renal cysts.  No hydronephrosis. Electronically Signed   By: Donavan Foil M.D.   On: 03/31/2021 21:23    ASSESSMENT: Rai stage I CLL.  PLAN:    1. CLL: wbc persistently but mildly elevated at 13.6. Stable for greater than 10 years. Anemia is chronic and stable. Platelet count decreased at 47, chronic and stable. Recommend continued observation. He will continue to have interval blood work with pcp and if concerning, we can see him back sooner. Otherwise, recommend  returning to clinic for labs and MD/Finnegan in 1 year.  2. Thrombocytopenia- fluctuates between 40s and 70s over past 10 years. Today, platelet count is 47. No indication for bone marrow biopsy at this time. Continue to monitor.  3. Anemia- Hemoglobin ranges between 9s  and 11s. Persistent macrocytosis. Monitor.  4. Lymphadenopathy- CT from 04/09/2017 revealed enlarged pelvic and retroperitoneal lymph nodes. Enlarged compared to 2012 imaging. Likely related to underlying CLL. No further imaging necessary at this time unless there is a suspicion for progressive disease.  5. Chronic renal insufficiency- chronic. Avoid nephrotoxic medications. Managed by pcp.   RTC in 1 year for cbc, MD/Finnegan  Patient expressed understanding and was in agreement with this plan. He also understands that He can call clinic at any time with any questions, concerns, or complaints.   Verlon Au, NP   04/05/2021 9:27 PM

## 2021-04-05 NOTE — Progress Notes (Signed)
Patient denies new problems/concerns today.   °

## 2021-04-23 ENCOUNTER — Ambulatory Visit (INDEPENDENT_AMBULATORY_CARE_PROVIDER_SITE_OTHER): Payer: Medicare Other | Admitting: Vascular Surgery

## 2021-04-23 ENCOUNTER — Other Ambulatory Visit: Payer: Self-pay

## 2021-04-23 ENCOUNTER — Ambulatory Visit (INDEPENDENT_AMBULATORY_CARE_PROVIDER_SITE_OTHER): Payer: Medicare Other

## 2021-04-23 ENCOUNTER — Encounter (INDEPENDENT_AMBULATORY_CARE_PROVIDER_SITE_OTHER): Payer: Self-pay | Admitting: Vascular Surgery

## 2021-04-23 VITALS — BP 158/72 | HR 60 | Resp 16 | Wt 217.4 lb

## 2021-04-23 DIAGNOSIS — I714 Abdominal aortic aneurysm, without rupture, unspecified: Secondary | ICD-10-CM

## 2021-04-23 DIAGNOSIS — N183 Chronic kidney disease, stage 3 unspecified: Secondary | ICD-10-CM | POA: Diagnosis not present

## 2021-04-23 DIAGNOSIS — I6523 Occlusion and stenosis of bilateral carotid arteries: Secondary | ICD-10-CM

## 2021-04-23 DIAGNOSIS — I10 Essential (primary) hypertension: Secondary | ICD-10-CM

## 2021-04-23 DIAGNOSIS — E119 Type 2 diabetes mellitus without complications: Secondary | ICD-10-CM | POA: Diagnosis not present

## 2021-04-23 NOTE — Progress Notes (Signed)
MRN : [id number]  Patrick Payne Calbert Brooke Bonito. is a 87 y.o. (August 19, 1934) male who presents with chief complaint of  Chief Complaint  Patient presents with   Follow-up    Ultrasound follow up  .  History of Present Illness: Patient returns today in follow up of multiple vascular issues.  He is doing well today.  He denies any specific complaints.  He has no aneurysm related symptoms. Specifically, the patient denies new back or abdominal pain, or signs of peripheral embolization. Aortic duplex today reveals a stable 4.1 cm abdominal aortic aneurysm with measurements slightly smaller than what we saw 6 months ago.  This is more in line with his previous studies around 4 cm in diameter. He is also studied with carotid duplex today.  This demonstrates stable 1 to 39% ICA stenosis bilaterally.  No significant progression from previous study.  No focal neurologic symptoms. Specifically, the patient denies amaurosis fugax, speech or swallowing difficulties, or arm or leg weakness or numbness   Current Outpatient Medications  Medication Sig Dispense Refill   Aflibercept 2 MG/0.05ML SOLN Inject 1 Dose into the eye as directed. One injection into left eye every 8-9 weeks     allopurinol (ZYLOPRIM) 100 MG tablet Take 100 mg by mouth daily.     atorvastatin (LIPITOR) 10 MG tablet Take 10 mg by mouth at bedtime.      carvedilol (COREG) 12.5 MG tablet Take 12.5 mg by mouth 2 (two) times daily with a meal.     Cholecalciferol (VITAMIN D-3) 5000 units TABS Take 5,000 Units by mouth at bedtime.      clonazePAM (KLONOPIN) 1 MG tablet Take 1 mg by mouth at bedtime.      cyanocobalamin 500 MCG tablet Take 1,000 mcg by mouth at bedtime.      empagliflozin (JARDIANCE) 10 MG TABS tablet      fexofenadine (ALLEGRA) 180 MG tablet Take 180 mg by mouth daily as needed for allergies.      finasteride (PROSCAR) 5 MG tablet TAKE 1 TABLET(5 MG) BY MOUTH DAILY 90 tablet 2   insulin glargine (LANTUS) 100 UNIT/ML injection  Inject 50 Units into the skin daily.     LORazepam (ATIVAN) 1 MG tablet Take 1 mg by mouth every 8 (eight) hours as needed for anxiety (takes 1 tablet every morning.  Rarely has to take again during the day).      Multiple Vitamins-Minerals (PRESERVISION AREDS 2 PO) Take 1 capsule by mouth 2 (two) times daily.      omega-3 acid ethyl esters (LOVAZA) 1 g capsule Take by mouth 2 (two) times daily.     tamsulosin (FLOMAX) 0.4 MG CAPS capsule TAKE 1 CAPSULE(0.4 MG) BY MOUTH DAILY AFTER SUPPER 90 capsule 2   telmisartan (MICARDIS) 40 MG tablet Take 40 mg by mouth in the morning and at bedtime.     triamcinolone (NASACORT) 55 MCG/ACT AERO nasal inhaler Place 2 sprays into the nose 2 (two) times daily as needed (allergies).      calcipotriene-betamethasone (TACLONEX) ointment Apply 1 application topically as needed (psoriasis). (Patient not taking: No sig reported)     COVID-19 mRNA Vac-TriS, Pfizer, SUSP injection USE AS DIRECTED (Patient not taking: No sig reported) .3 mL 0   ibuprofen (ADVIL,MOTRIN) 200 MG tablet Take 400 mg by mouth every 6 (six) hours as needed for mild pain. (Patient not taking: No sig reported)     No current facility-administered medications for this visit.    Past Medical  History:  Diagnosis Date   Anxiety    CKD (chronic kidney disease), stage III (HCC)    CLL (chronic lymphocytic leukemia) (HCC)    Diabetes mellitus without complication ([place])    History of kidney stones    HLD (hyperlipidemia)    HTN (hypertension)    PKD (polycystic kidney disease)    Sleep apnea     Past Surgical History:  Procedure Laterality Date   CARDIAC CATHETERIZATION  12/1987   CENTRAL LINE INSERTION N/A 04/11/2017   Procedure: Central Line Insertion;  Surgeon: Katha Cabal, MD;  Location: [facility];  Service: Cardiovascular;  Laterality: N/A;   CERVICAL FUSION     CHOLECYSTECTOMY     CYSTOSCOPY W/ RETROGRADES Left 05/12/2017   Procedure: CYSTOSCOPY WITH RETROGRADE  PYELOGRAM;  Surgeon: Nickie Retort, MD;  Location: [facility];  Service: Urology;  Laterality: Left;   CYSTOSCOPY W/ URETERAL STENT PLACEMENT Left 05/12/2017   Procedure: CYSTOSCOPY WITH STENT REMOVAL;  Surgeon: Nickie Retort, MD;  Location: [facility];  Service: Urology;  Laterality: Left;   CYSTOSCOPY WITH STENT PLACEMENT Left 04/11/2017   Procedure: CYSTOSCOPY WITH STENT PLACEMENT;  Surgeon: Festus Aloe, MD;  Location: [facility];  Service: Urology;  Laterality: Left;   URETEROSCOPY  05/12/2017   Procedure: URETEROSCOPY;  Surgeon: Nickie Retort, MD;  Location: [facility];  Service: Urology;;     Social History   Tobacco Use   Smoking status: Former   Smokeless tobacco: Never   Tobacco comments:    quit in Feb of 1997  Substance Use Topics   Alcohol use: No   Drug use: No      Family History  Problem Relation Age of Onset   Cancer Mother    Varicose Veins Mother    Cancer Father      Allergies  Allergen Reactions   Celecoxib Other (See Comments)    Increased Blood Pressure   Chlorpromazine Nausea Only   Prednisone Other (See Comments)    Diabetic   Amoxicillin-Pot Clavulanate Rash    Has patient had a PCN reaction causing immediate rash, facial/tongue/throat swelling, SOB or lightheadedness with hypotension: No Has patient had a PCN reaction causing severe rash involving mucus membranes or skin necrosis: No Has patient had a PCN reaction that required hospitalization: No Has patient had a PCN reaction occurring within the last 10 years: Yes If all of the above answers are "NO", then may proceed with Cephalosporin use.    Penicillin V Potassium Rash    REVIEW OF SYSTEMS (Negative unless checked)   Constitutional: [] Weight loss  [] Fever  [] Chills Cardiac: [] Chest pain   [] Chest pressure   [] Palpitations   [] Shortness of breath when laying flat   [] Shortness of breath at rest   [] Shortness of breath with exertion. Vascular:  [] Pain in legs with walking    [] Pain in legs at rest   [] Pain in legs when laying flat   [] Claudication   [] Pain in feet when walking  [] Pain in feet at rest  [] Pain in feet when laying flat   [] History of DVT   [] Phlebitis   [] Swelling in legs   [] Varicose veins   [] Non-healing ulcers Pulmonary:   [] Uses home oxygen   [] Productive cough   [] Hemoptysis   [] Wheeze  [] COPD   [] Asthma Neurologic:  [] Dizziness  [] Blackouts   [] Seizures   [] History of stroke   [] History of TIA  [] Aphasia   [] Temporary blindness   [] Dysphagia   [] Weakness or  numbness in arms   [] Weakness or numbness in legs Musculoskeletal:  [x] Arthritis   [] Joint swelling   [x] Joint pain   [] Low back pain Hematologic:  [] Easy bruising  [] Easy bleeding   [] Hypercoagulable state   [] Anemic   Gastrointestinal:  [] Blood in stool   [] Vomiting blood  [] Gastroesophageal reflux/heartburn   [] Abdominal pain Genitourinary:  [x] Chronic kidney disease   [] Difficult urination  [] Frequent urination  [] Burning with urination   [] Hematuria Skin:  [] Rashes   [] Ulcers   [] Wounds Psychological:  [x] History of anxiety   []  History of major depression.  Physical Examination  BP (!) 158/72 (BP Location: Right Arm)   Pulse 60   Resp 16   Wt 217 lb 6.4 oz (98.6 kg)   BMI 36.18 kg/m  Gen:  WD/WN, NAD Head: [place]/AT, No temporalis wasting. Ear/Nose/Throat: Hearing grossly intact, nares w/o erythema or drainage Eyes: Conjunctiva clear. Sclera non-icteric Neck: Supple.  Trachea midline Pulmonary:  Good air movement, no use of accessory muscles.  Cardiac: RRR, no JVD Vascular:  Vessel Right Left  Radial Palpable Palpable                                   Gastrointestinal: soft, non-tender/non-distended. No guarding/reflex. Aorta not easily palpable Musculoskeletal: M/S 5/5 throughout.  No deformity or atrophy. No edema. Neurologic: Sensation grossly intact in extremities.  Symmetrical.  Speech is fluent.  Psychiatric: Judgment intact, Mood & affect appropriate for pt's  clinical situation. Dermatologic: No rashes or ulcers noted.  No cellulitis or open wounds.      Labs Recent Results (from the past 2160 hour(s))  CBC with Differential     Status: Abnormal   Collection Time: 04/05/21  2:12 PM  Result Value Ref Range   WBC 13.6 (H) 4.0 - 10.5 K/uL   RBC 2.77 (L) 4.22 - 5.81 MIL/uL   Hemoglobin 9.1 (L) 13.0 - 17.0 g/dL   HCT 28.3 (L) 39.0 - 52.0 %   MCV 102.2 (H) 80.0 - 100.0 fL   MCH 32.9 26.0 - 34.0 pg   MCHC 32.2 30.0 - 36.0 g/dL   RDW 14.2 11.5 - 15.5 %   Platelets 47 (L) 150 - 400 K/uL   nRBC 0.0 0.0 - 0.2 %   Neutrophils Relative % 21 %   Neutro Abs 2.8 1.7 - 7.7 K/uL   Lymphocytes Relative 71 %   Lymphs Abs 9.7 (H) 0.7 - 4.0 K/uL   Monocytes Relative 7 %   Monocytes Absolute 1.0 0.1 - 1.0 K/uL   Eosinophils Relative 1 %   Eosinophils Absolute 0.1 0.0 - 0.5 K/uL   Basophils Relative 0 %   Basophils Absolute 0.0 0.0 - 0.1 K/uL   WBC Morphology ABSOLUTE LYMPHOCYTOSIS     Comment: SMUDGE CELLS DIFF. CONFIRMED BY SMEAR    Smear Review Normal platelet morphology     Comment: PLATELETS APPEAR DECREASED PLATELET COUNT CONFIRMED BY SMEAR    Immature Granulocytes 0 %   Abs Immature Granulocytes 0.02 0.00 - 0.07 K/uL   Smudge Cells PRESENT     Comment: Performed at [facility], [address] [place], [place] [address]    Radiology US RENAL  Result Date: 03/31/2021 CLINICAL DATA:  Stage 3 chronic kidney disease EXAM: RENAL / URINARY TRACT ULTRASOUND COMPLETE COMPARISON:  06/26/2019, CT 04/09/2017 FINDINGS: Right Kidney: Renal measurements: 23.5 x 12.6 x 15.2 cm = volume: 2346.22 mL. Innumerable renal cysts, measuring  up to 5.7 cm. No gross hydronephrosis. Left Kidney: Renal measurements: 12.9 x 6.4 x 5.3 cm = volume: 229.5 mL. Cortical echogenicity within normal limits. No hydronephrosis. Upper pole cyst measuring 15 x 14 x 15 mm. Exophytic midpole cyst measuring 20 x 15 x 22 mm Bladder: Appears normal for degree of bladder  distention. Other: None. IMPRESSION: 1. Enlarged polycystic right kidney without hydronephrosis. 2. Several left renal cysts.  No hydronephrosis. Electronically Signed   By: Donavan Foil M.D.   On: 03/31/2021 21:23    Assessment/Plan Diabetes mellitus ([place]) blood glucose control important in reducing the progression of atherosclerotic disease. Also, involved in wound healing. On appropriate medications.     BP (high blood pressure) blood pressure control important in reducing the progression of atherosclerotic disease and aneurysmal disease. On appropriate oral medications.   Chronic kidney disease (CKD), stage III (moderate) Avoid contrast with CT scan unless size appears to be in the range to need repair.  Carotid stenosis carotid duplex which demonstrated significant carotid plaque bilaterally without hemodynamically significant stenosis.  No role for intervention.  Continue current medical regimen.  Recheck in 1 year.  Abdominal aortic aneurysm (AAA) ([place]) Aortic duplex today reveals a stable 4.1 cm abdominal aortic aneurysm with measurements slightly smaller than what we saw 6 months ago.  This is more in line with his previous studies around 4 cm in diameter.  We will continue to follow this on 22-month intervals as a greater than 4 cm aneurysm with duplex.  Blood pressure control is important for avoiding growth of the aneurysm.    Leotis Pain, MD  04/23/2021 9:23 AM    This note was created with Dragon medical transcription system.  Any errors from dictation are purely unintentional

## 2021-04-23 NOTE — Assessment & Plan Note (Signed)
Aortic duplex today reveals a stable 4.1 cm abdominal aortic aneurysm with measurements slightly smaller than what we saw 6 months ago.  This is more in line with his previous studies around 4 cm in diameter.  We will continue to follow this on 28-month intervals as a greater than 4 cm aneurysm with duplex.  Blood pressure control is important for avoiding growth of the aneurysm.

## 2021-04-26 ENCOUNTER — Other Ambulatory Visit: Payer: Medicare Other

## 2021-05-24 DIAGNOSIS — N184 Chronic kidney disease, stage 4 (severe): Secondary | ICD-10-CM | POA: Insufficient documentation

## 2021-06-24 ENCOUNTER — Ambulatory Visit: Payer: Medicare Other | Admitting: Urology

## 2021-07-06 ENCOUNTER — Other Ambulatory Visit: Payer: Self-pay

## 2021-07-06 ENCOUNTER — Ambulatory Visit: Payer: Medicare Other | Attending: Internal Medicine

## 2021-07-06 DIAGNOSIS — Z23 Encounter for immunization: Secondary | ICD-10-CM

## 2021-07-06 MED ORDER — PFIZER COVID-19 VAC BIVALENT 30 MCG/0.3ML IM SUSP
INTRAMUSCULAR | 0 refills | Status: DC
  Filled 2021-07-06: qty 0.3, 1d supply, fill #0

## 2021-07-06 NOTE — Progress Notes (Signed)
   [id number]  Name:  Patrick Payne.    MRN: [id number] DOB: September 03, 1934  07/06/2021  [name] was observed post Covid-19 immunization for 15 minutes without incident. He was provided with Vaccine Information Sheet and instruction to access the V-Safe system.   [name] was instructed to call 911 with any severe reactions post vaccine: Difficulty breathing  Swelling of face and throat  A fast heartbeat  A bad rash all over body  Dizziness and weakness   Lu Duffel, PharmD, MBA Clinical Acute Care Pharmacist

## 2021-07-15 ENCOUNTER — Other Ambulatory Visit: Payer: Self-pay

## 2021-07-15 ENCOUNTER — Encounter: Payer: Self-pay | Admitting: Urology

## 2021-07-15 ENCOUNTER — Ambulatory Visit: Payer: Medicare Other | Admitting: Urology

## 2021-07-15 VITALS — BP 144/77 | HR 67 | Ht 66.0 in | Wt 210.0 lb

## 2021-07-15 DIAGNOSIS — R310 Gross hematuria: Secondary | ICD-10-CM | POA: Diagnosis not present

## 2021-07-15 DIAGNOSIS — N401 Enlarged prostate with lower urinary tract symptoms: Secondary | ICD-10-CM | POA: Diagnosis not present

## 2021-07-15 LAB — BLADDER SCAN AMB NON-IMAGING

## 2021-07-15 MED ORDER — TAMSULOSIN HCL 0.4 MG PO CAPS: 0.4000 mg | ORAL_CAPSULE | Freq: Every day | ORAL | 3 refills | Status: DC

## 2021-07-15 MED ORDER — FINASTERIDE 5 MG PO TABS: 5.0000 mg | ORAL_TABLET | Freq: Every day | ORAL | 3 refills | Status: DC

## 2021-07-15 NOTE — Progress Notes (Signed)
   07/15/2021 4:07 PM   Patrick Altes Waldren Jr. 1934/06/29 [id number]  Reason for visit: Follow up functionally solitary left kidney, CKD, BPH, gross hematuria  HPI: 87 year old male with interesting urologic history.He had acute onset of renal failure in July 2018 and was found to have left hydronephrosis and a functionally solitary left kidney, with severe polycystic disease of the right kidney.  He underwent ureteral stent placement which improved his renal function, and ultimately underwent negative ureteroscopy and hydronephrosis resolved.  He had a cystoscopy with an outside urologist a few days prior to developing the hydronephrosis, and he feels he developed an infection from this causing the hydronephrosis originally.  A follow-up ultrasound 06/09/2017 showed resolution of his hydronephrosis after stent removal.   He reports 1 episode of right flank pain with gross hematuria in June 2022 that resolved after 1 void, and a renal ultrasound was performed by his nephrologist that showed polycystic right kidney without hydronephrosis, and no left-sided hydronephrosis.  This was felt to represent a right renal ruptured cyst.  Bladder was normal on ultrasound.  I personally viewed and interpreted the renal ultrasound and agree with his findings.  He is averse to repeating cystoscopy since he had complications from his prior cystoscopy with a different provider.  Renal function is essentially stable with creatinine of 2, EGFR 32.  He is on maximal medical therapy for BPH, and really denies any urinary complaints today.  PVR is normal.  He does have some increased urinary frequency and volume that he attributes to his new Jardiance.  Continue Flomax and finasteride RTC 1 year PVR  [name], MD  [facility] [address], [place], [place] [address] [phone]

## 2021-08-19 ENCOUNTER — Other Ambulatory Visit (INDEPENDENT_AMBULATORY_CARE_PROVIDER_SITE_OTHER): Payer: Self-pay | Admitting: Nephrology

## 2021-08-19 DIAGNOSIS — I739 Peripheral vascular disease, unspecified: Secondary | ICD-10-CM

## 2021-08-25 ENCOUNTER — Inpatient Hospital Stay: Payer: Medicare Other | Attending: Oncology | Admitting: Oncology

## 2021-08-25 ENCOUNTER — Other Ambulatory Visit: Payer: Self-pay

## 2021-08-25 ENCOUNTER — Ambulatory Visit (INDEPENDENT_AMBULATORY_CARE_PROVIDER_SITE_OTHER): Payer: Medicare Other

## 2021-08-25 ENCOUNTER — Inpatient Hospital Stay: Payer: Medicare Other

## 2021-08-25 DIAGNOSIS — Z79899 Other long term (current) drug therapy: Secondary | ICD-10-CM | POA: Diagnosis not present

## 2021-08-25 DIAGNOSIS — D696 Thrombocytopenia, unspecified: Secondary | ICD-10-CM | POA: Insufficient documentation

## 2021-08-25 DIAGNOSIS — C9110 Chronic lymphocytic leukemia of B-cell type not having achieved remission: Secondary | ICD-10-CM | POA: Insufficient documentation

## 2021-08-25 DIAGNOSIS — R590 Localized enlarged lymph nodes: Secondary | ICD-10-CM | POA: Insufficient documentation

## 2021-08-25 DIAGNOSIS — N183 Chronic kidney disease, stage 3 unspecified: Secondary | ICD-10-CM | POA: Diagnosis not present

## 2021-08-25 DIAGNOSIS — D631 Anemia in chronic kidney disease: Secondary | ICD-10-CM | POA: Diagnosis not present

## 2021-08-25 DIAGNOSIS — I739 Peripheral vascular disease, unspecified: Secondary | ICD-10-CM

## 2021-08-25 DIAGNOSIS — N189 Chronic kidney disease, unspecified: Secondary | ICD-10-CM

## 2021-08-25 DIAGNOSIS — Z794 Long term (current) use of insulin: Secondary | ICD-10-CM | POA: Diagnosis not present

## 2021-08-25 LAB — CBC
HCT: 29 % — ABNORMAL LOW (ref 39.0–52.0)
Hemoglobin: 9.2 g/dL — ABNORMAL LOW (ref 13.0–17.0)
MCH: 32.7 pg (ref 26.0–34.0)
MCHC: 31.7 g/dL (ref 30.0–36.0)
MCV: 103.2 fL — ABNORMAL HIGH (ref 80.0–100.0)
Platelets: 45 10*3/uL — ABNORMAL LOW (ref 150–400)
RBC: 2.81 MIL/uL — ABNORMAL LOW (ref 4.22–5.81)
RDW: 14.4 % (ref 11.5–15.5)
WBC: 13.8 10*3/uL — ABNORMAL HIGH (ref 4.0–10.5)
nRBC: 0 % (ref 0.0–0.2)

## 2021-08-25 LAB — LACTATE DEHYDROGENASE: LDH: 193 U/L — ABNORMAL HIGH (ref 98–192)

## 2021-08-25 LAB — RETICULOCYTES
Immature Retic Fract: 16.3 % — ABNORMAL HIGH (ref 2.3–15.9)
RBC.: 2.72 MIL/uL — ABNORMAL LOW (ref 4.22–5.81)
Retic Count, Absolute: 28.6 10*3/uL (ref 19.0–186.0)
Retic Ct Pct: 1.1 % (ref 0.4–3.1)

## 2021-08-25 LAB — IRON AND TIBC
Iron: 100 ug/dL (ref 45–182)
Saturation Ratios: 30 % (ref 17.9–39.5)
TIBC: 333 ug/dL (ref 250–450)
UIBC: 233 ug/dL

## 2021-08-25 LAB — DAT, POLYSPECIFIC AHG (ARMC ONLY): Polyspecific AHG test: NEGATIVE

## 2021-08-25 LAB — FOLATE: Folate: 3.5 ng/mL — ABNORMAL LOW (ref >5.9)

## 2021-08-25 LAB — VITAMIN B12: Vitamin B-12: 1281 pg/mL — ABNORMAL HIGH (ref 180–914)

## 2021-08-25 LAB — FERRITIN: Ferritin: 31 ng/mL (ref 24–336)

## 2021-08-25 NOTE — Progress Notes (Signed)
Patrick Payne  Telephone:([phone] [phone]  ID: Patrick Altes Kealey Jr. OB: 1934-04-23  MR#: [id number]  FGH#:[id number]  Patient Care Team: Idelle Crouch, MD as PCP - General (Internal Medicine) Lloyd Huger, MD as Consulting Physician (Oncology)  CHIEF COMPLAINT: CLL, anemia secondary to chronic renal insufficiency  INTERVAL HISTORY: Patient returns to clinic today as an add-on with a referral from nephrology for declining hemoglobin secondary to chronic renal insufficiency.  Patient continues to feel well and remains at his baseline.  He does not complain of any weakness or fatigue.  He denies any fevers, night sweats, or weight loss. He has noted no new lymphadenopathy. He has no neurologic complaints.  He denies any chest pain, shortness of breath, cough, or hemoptysis.  He denies any nausea, vomiting, constipation, or diarrhea. He has no urinary complaints.  Patient offers no specific complaints today.  REVIEW OF SYSTEMS:   Review of Systems  Constitutional: Negative.  Negative for diaphoresis, fever, malaise/fatigue and weight loss.  Respiratory: Negative.  Negative for cough and shortness of breath.   Cardiovascular: Negative.  Negative for chest pain and leg swelling.  Gastrointestinal: Negative.  Negative for abdominal pain, blood in stool and melena.  Genitourinary: Negative.  Negative for dysuria.  Musculoskeletal: Negative.  Negative for back pain.  Skin: Negative.  Negative for rash.  Neurological: Negative.  Negative for dizziness, sensory change, focal weakness, weakness and headaches.  Psychiatric/Behavioral: Negative.  The patient is not nervous/anxious.    As per HPI. Otherwise, a complete review of systems is negative.  PAST MEDICAL HISTORY: Past Medical History:  Diagnosis Date   Anxiety    CKD (chronic kidney disease), stage III (HCC)    CLL (chronic lymphocytic leukemia) ([place])    Diabetes mellitus without complication  ([place])    History of kidney stones    HLD (hyperlipidemia)    HTN (hypertension)    PKD (polycystic kidney disease)    Sleep apnea     PAST SURGICAL HISTORY: Past Surgical History:  Procedure Laterality Date   CARDIAC CATHETERIZATION  12/1987   CENTRAL LINE INSERTION N/A 04/11/2017   Procedure: Central Line Insertion;  Surgeon: Katha Cabal, MD;  Location: [facility];  Service: Cardiovascular;  Laterality: N/A;   CERVICAL FUSION     CHOLECYSTECTOMY     CYSTOSCOPY W/ RETROGRADES Left 05/12/2017   Procedure: CYSTOSCOPY WITH RETROGRADE PYELOGRAM;  Surgeon: Nickie Retort, MD;  Location: [facility];  Service: Urology;  Laterality: Left;   CYSTOSCOPY W/ URETERAL STENT PLACEMENT Left 05/12/2017   Procedure: CYSTOSCOPY WITH STENT REMOVAL;  Surgeon: Nickie Retort, MD;  Location: [facility];  Service: Urology;  Laterality: Left;   CYSTOSCOPY WITH STENT PLACEMENT Left 04/11/2017   Procedure: CYSTOSCOPY WITH STENT PLACEMENT;  Surgeon: Festus Aloe, MD;  Location: [facility];  Service: Urology;  Laterality: Left;   URETEROSCOPY  05/12/2017   Procedure: URETEROSCOPY;  Surgeon: Nickie Retort, MD;  Location: [facility];  Service: Urology;;    FAMILY HISTORY: Reported history of ovarian and lung cancer. Diabetes, hypertension.     ADVANCED DIRECTIVES:    HEALTH MAINTENANCE: Social History   Tobacco Use   Smoking status: Former   Smokeless tobacco: Never   Tobacco comments:    quit in Feb of 1997  Substance Use Topics   Alcohol use: No   Drug use: No     Colonoscopy:  PAP:  Bone density:  Lipid panel:  Allergies  Allergen Reactions  Celecoxib Other (See Comments)    Increased Blood Pressure   Chlorpromazine Nausea Only   Prednisone Other (See Comments)    Diabetic   Amoxicillin-Pot Clavulanate Rash    Has patient had a PCN reaction causing immediate rash, facial/tongue/throat swelling, SOB or lightheadedness with hypotension: No Has patient had a PCN  reaction causing severe rash involving mucus membranes or skin necrosis: No Has patient had a PCN reaction that required hospitalization: No Has patient had a PCN reaction occurring within the last 10 years: Yes If all of the above answers are "NO", then may proceed with Cephalosporin use.    Penicillin V Potassium Rash    Current Outpatient Medications  Medication Sig Dispense Refill   Aflibercept 2 MG/0.05ML SOLN Inject 1 Dose into the eye as directed. One injection into left eye every 8-9 weeks     allopurinol (ZYLOPRIM) 100 MG tablet Take 100 mg by mouth daily.     atorvastatin (LIPITOR) 10 MG tablet Take 10 mg by mouth at bedtime.      carvedilol (COREG) 12.5 MG tablet Take 12.5 mg by mouth 2 (two) times daily with a meal.     Cholecalciferol (VITAMIN D-3) 5000 units TABS Take 5,000 Units by mouth at bedtime.      clonazePAM (KLONOPIN) 1 MG tablet Take 1 mg by mouth at bedtime.      cyanocobalamin 500 MCG tablet Take 1,000 mcg by mouth at bedtime.      empagliflozin (JARDIANCE) 10 MG TABS tablet      fexofenadine (ALLEGRA) 180 MG tablet Take 180 mg by mouth daily as needed for allergies.      finasteride (PROSCAR) 5 MG tablet Take 1 tablet (5 mg total) by mouth daily. 90 tablet 3   insulin glargine (LANTUS) 100 UNIT/ML injection Inject 50 Units into the skin daily.     LORazepam (ATIVAN) 1 MG tablet Take 1 mg by mouth every 8 (eight) hours as needed for anxiety (takes 1 tablet every morning.  Rarely has to take again during the day).      Multiple Vitamins-Minerals (PRESERVISION AREDS 2 PO) Take 1 capsule by mouth 2 (two) times daily.      omega-3 acid ethyl esters (LOVAZA) 1 g capsule Take by mouth 2 (two) times daily.     tamsulosin (FLOMAX) 0.4 MG CAPS capsule Take 1 capsule (0.4 mg total) by mouth daily after supper. 90 capsule 3   telmisartan (MICARDIS) 40 MG tablet Take 40 mg by mouth in the morning and at bedtime.     triamcinolone (NASACORT) 55 MCG/ACT AERO nasal inhaler Place  2 sprays into the nose 2 (two) times daily as needed (allergies).      No current facility-administered medications for this visit.    OBJECTIVE: There were no vitals filed for this visit.    There is no height or weight on file to calculate BMI.    ECOG FS:0 - Asymptomatic  General: Well-developed, well-nourished, no acute distress. Eyes: Pink conjunctiva, anicteric sclera. HEENT: Normocephalic, moist mucous membranes. Lungs: No audible wheezing or coughing. Heart: Regular rate and rhythm. Abdomen: Soft, nontender, no obvious distention. Musculoskeletal: No edema, cyanosis, or clubbing. Neuro: Alert, answering all questions appropriately. Cranial nerves grossly intact. Skin: No rashes or petechiae noted. Psych: Normal affect.    LAB RESULTS:  Lab Results  Component Value Date   NA 140 11/19/2019   K 3.9 11/19/2019   CL 109 11/19/2019   CO2 21 (L) 11/19/2019   GLUCOSE 159 (H)  11/19/2019   BUN 33 (H) 11/19/2019   CREATININE 1.89 (H) 11/19/2019   CALCIUM 9.6 11/19/2019   PROT 6.1 (L) 04/09/2017   ALBUMIN 2.5 (L) 04/17/2017   AST 22 04/09/2017   ALT 19 04/09/2017   ALKPHOS 76 04/09/2017   BILITOT 0.9 04/09/2017   GFRNONAA 32 (L) 11/19/2019   GFRAA 37 (L) 11/19/2019    Lab Results  Component Value Date   WBC 13.8 (H) 08/25/2021   NEUTROABS 2.8 04/05/2021   HGB 9.2 (L) 08/25/2021   HCT 29.0 (L) 08/25/2021   MCV 103.2 (H) 08/25/2021   PLT 45 (L) 08/25/2021   Lab Results  Component Value Date   IRON 100 08/25/2021   TIBC 333 08/25/2021   IRONPCTSAT 30 08/25/2021   Lab Results  Component Value Date   FERRITIN 31 08/25/2021     STUDIES: No results found.  ASSESSMENT: Rai stage I CLL, anemia secondary to chronic renal insufficiency.  PLAN:    1. CLL: Patient's white blood cell count remains mildly elevated at 13.8, but this has been essentially unchanged for greater than 10 years.  He also has a chronic thrombocytopenia that is also unchanged.  Continue  simple observation.   2. Thrombocytopenia: Chronic and unchanged for greater than 10 years.  Patient's platelet count was 45 today which is approximately his baseline.  Patient does not require bone marrow biopsy.  Follow-up as above.   3.  Chronic renal insufficiency: Chronic and unchanged.  Continue monitoring and treatment per nephrology.    4. Lymphadenopathy: CT scan results from April 09, 2017 reviewed independently with enlarged pelvic and retroperitoneal lymph nodes. These are also noted on a scan in 2012 appear to be slightly larger. No intervention is needed. These are likely secondary to patient's underlying CLL.  No further imaging is necessary unless there is suspicion of progression of disease.  Return to clinic in 1 year as above. 5.  Anemia secondary to chronic renal insufficiency: Patient's hemoglobin has declined to 9.2.  Iron panel and B12 are within normal limits.  He was noted to have a mildly decreased folate level.  Return to clinic in 1 month for further evaluation and initiation of Retacrit.   Patient expressed understanding and was in agreement with this plan. He also understands that He can call clinic at any time with any questions, concerns, or complaints.   Lloyd Huger, MD   08/26/2021 12:04 PM

## 2021-08-25 NOTE — Progress Notes (Signed)
Pt referred here for anemai. Wife reports fatigue, cold intolerance and pt states feeling weak in legs.

## 2021-08-26 ENCOUNTER — Encounter: Payer: Self-pay | Admitting: Oncology

## 2021-08-26 LAB — HAPTOGLOBIN: Haptoglobin: 87 mg/dL (ref 38–329)

## 2021-08-27 LAB — ERYTHROPOIETIN: Erythropoietin: 31 m[IU]/mL — ABNORMAL HIGH (ref 2.6–18.5)

## 2021-09-23 ENCOUNTER — Inpatient Hospital Stay (HOSPITAL_BASED_OUTPATIENT_CLINIC_OR_DEPARTMENT_OTHER): Payer: Medicare Other | Admitting: Oncology

## 2021-09-23 ENCOUNTER — Inpatient Hospital Stay: Payer: Medicare Other

## 2021-09-23 ENCOUNTER — Inpatient Hospital Stay: Payer: Medicare Other | Attending: Oncology

## 2021-09-23 ENCOUNTER — Other Ambulatory Visit: Payer: Self-pay

## 2021-09-23 VITALS — BP 158/77 | HR 61 | Temp 97.2°F | Resp 16 | Wt 210.2 lb

## 2021-09-23 DIAGNOSIS — D631 Anemia in chronic kidney disease: Secondary | ICD-10-CM | POA: Diagnosis not present

## 2021-09-23 DIAGNOSIS — D696 Thrombocytopenia, unspecified: Secondary | ICD-10-CM | POA: Diagnosis not present

## 2021-09-23 DIAGNOSIS — N189 Chronic kidney disease, unspecified: Secondary | ICD-10-CM | POA: Diagnosis not present

## 2021-09-23 DIAGNOSIS — Z87891 Personal history of nicotine dependence: Secondary | ICD-10-CM | POA: Diagnosis not present

## 2021-09-23 DIAGNOSIS — Z794 Long term (current) use of insulin: Secondary | ICD-10-CM | POA: Insufficient documentation

## 2021-09-23 DIAGNOSIS — Z79899 Other long term (current) drug therapy: Secondary | ICD-10-CM | POA: Insufficient documentation

## 2021-09-23 DIAGNOSIS — C9110 Chronic lymphocytic leukemia of B-cell type not having achieved remission: Secondary | ICD-10-CM | POA: Insufficient documentation

## 2021-09-23 DIAGNOSIS — N183 Chronic kidney disease, stage 3 unspecified: Secondary | ICD-10-CM | POA: Diagnosis not present

## 2021-09-23 LAB — CBC WITH DIFFERENTIAL/PLATELET
Abs Immature Granulocytes: 0.02 10*3/uL (ref 0.00–0.07)
Basophils Absolute: 0 10*3/uL (ref 0.0–0.1)
Basophils Relative: 0 %
Eosinophils Absolute: 0.1 10*3/uL (ref 0.0–0.5)
Eosinophils Relative: 1 %
HCT: 28.9 % — ABNORMAL LOW (ref 39.0–52.0)
Hemoglobin: 9.1 g/dL — ABNORMAL LOW (ref 13.0–17.0)
Immature Granulocytes: 0 %
Lymphocytes Relative: 74 %
Lymphs Abs: 11.1 10*3/uL — ABNORMAL HIGH (ref 0.7–4.0)
MCH: 33.1 pg (ref 26.0–34.0)
MCHC: 31.5 g/dL (ref 30.0–36.0)
MCV: 105.1 fL — ABNORMAL HIGH (ref 80.0–100.0)
Monocytes Absolute: 1.4 10*3/uL — ABNORMAL HIGH (ref 0.1–1.0)
Monocytes Relative: 9 %
Neutro Abs: 2.4 10*3/uL (ref 1.7–7.7)
Neutrophils Relative %: 16 %
Platelets: 53 10*3/uL — ABNORMAL LOW (ref 150–400)
RBC: 2.75 MIL/uL — ABNORMAL LOW (ref 4.22–5.81)
RDW: 14.6 % (ref 11.5–15.5)
Smear Review: NORMAL
WBC: 15 10*3/uL — ABNORMAL HIGH (ref 4.0–10.5)
nRBC: 0 % (ref 0.0–0.2)

## 2021-09-23 MED ORDER — EPOETIN ALFA-EPBX 40000 UNIT/ML IJ SOLN
40000.0000 [IU] | Freq: Once | INTRAMUSCULAR | Status: AC
  Filled 2021-09-23: qty 1

## 2021-09-23 NOTE — Progress Notes (Signed)
Pt has no concerns at this time. 

## 2021-09-23 NOTE — Progress Notes (Signed)
[place]  [phone] [phone]  ID: Patrick Altes Eves Jr. OB: 1934-04-06  MR#: [id number]  HCW#:[id number]  Patient Care Team: Idelle Crouch, MD as PCP - General (Internal Medicine) Lloyd Huger, MD as Consulting Physician (Oncology)  CHIEF COMPLAINT: CLL, anemia secondary to chronic renal insufficiency  INTERVAL HISTORY: Patient returns to clinic today for further evaluation and initiation of Retacrit.  He continues to feel well and remains asymptomatic.  He does not complain of any weakness or fatigue.  He denies any fevers, night sweats, or weight loss. He has noted no new lymphadenopathy. He has no neurologic complaints.  He denies any chest pain, shortness of breath, cough, or hemoptysis.  He denies any nausea, vomiting, constipation, or diarrhea. He has no urinary complaints.  Patient feels at his baseline offers no specific complaints today.  REVIEW OF SYSTEMS:   Review of Systems  Constitutional: Negative.  Negative for diaphoresis, fever, malaise/fatigue and weight loss.  Respiratory: Negative.  Negative for cough and shortness of breath.   Cardiovascular: Negative.  Negative for chest pain and leg swelling.  Gastrointestinal: Negative.  Negative for abdominal pain, blood in stool and melena.  Genitourinary: Negative.  Negative for dysuria.  Musculoskeletal: Negative.  Negative for back pain.  Skin: Negative.  Negative for rash.  Neurological: Negative.  Negative for dizziness, sensory change, focal weakness, weakness and headaches.  Psychiatric/Behavioral: Negative.  The patient is not nervous/anxious.    As per HPI. Otherwise, a complete review of systems is negative.  PAST MEDICAL HISTORY: Past Medical History:  Diagnosis Date   Anxiety    CKD (chronic kidney disease), stage III (HCC)    CLL (chronic lymphocytic leukemia) ([place])    Diabetes mellitus without complication ([place])    History of kidney stones    HLD  (hyperlipidemia)    HTN (hypertension)    PKD (polycystic kidney disease)    Sleep apnea     PAST SURGICAL HISTORY: Past Surgical History:  Procedure Laterality Date   CARDIAC CATHETERIZATION  12/1987   CENTRAL LINE INSERTION N/A 04/11/2017   Procedure: Central Line Insertion;  Surgeon: Katha Cabal, MD;  Location: [facility];  Service: Cardiovascular;  Laterality: N/A;   CERVICAL FUSION     CHOLECYSTECTOMY     CYSTOSCOPY W/ RETROGRADES Left 05/12/2017   Procedure: CYSTOSCOPY WITH RETROGRADE PYELOGRAM;  Surgeon: Nickie Retort, MD;  Location: [facility];  Service: Urology;  Laterality: Left;   CYSTOSCOPY W/ URETERAL STENT PLACEMENT Left 05/12/2017   Procedure: CYSTOSCOPY WITH STENT REMOVAL;  Surgeon: Nickie Retort, MD;  Location: [facility];  Service: Urology;  Laterality: Left;   CYSTOSCOPY WITH STENT PLACEMENT Left 04/11/2017   Procedure: CYSTOSCOPY WITH STENT PLACEMENT;  Surgeon: Festus Aloe, MD;  Location: [facility];  Service: Urology;  Laterality: Left;   URETEROSCOPY  05/12/2017   Procedure: URETEROSCOPY;  Surgeon: Nickie Retort, MD;  Location: [facility];  Service: Urology;;    FAMILY HISTORY: Reported history of ovarian and lung cancer. Diabetes, hypertension.     ADVANCED DIRECTIVES:    HEALTH MAINTENANCE: Social History   Tobacco Use   Smoking status: Former   Smokeless tobacco: Never   Tobacco comments:    quit in Feb of 1997  Substance Use Topics   Alcohol use: No   Drug use: No     Colonoscopy:  PAP:  Bone density:  Lipid panel:  Allergies  Allergen Reactions   Celecoxib Other (See Comments)  Increased Blood Pressure   Chlorpromazine Nausea Only   Prednisone Other (See Comments)    Diabetic   Amoxicillin-Pot Clavulanate Rash    Has patient had a PCN reaction causing immediate rash, facial/tongue/throat swelling, SOB or lightheadedness with hypotension: No Has patient had a PCN reaction causing severe rash involving mucus  membranes or skin necrosis: No Has patient had a PCN reaction that required hospitalization: No Has patient had a PCN reaction occurring within the last 10 years: Yes If all of the above answers are "NO", then may proceed with Cephalosporin use.    Penicillin V Potassium Rash    Current Outpatient Medications  Medication Sig Dispense Refill   Aflibercept 2 MG/0.05ML SOLN Inject 1 Dose into the eye as directed. One injection into left eye every 8-9 weeks     allopurinol (ZYLOPRIM) 100 MG tablet Take 100 mg by mouth daily.     atorvastatin (LIPITOR) 10 MG tablet Take 10 mg by mouth at bedtime.      carvedilol (COREG) 12.5 MG tablet Take 12.5 mg by mouth 2 (two) times daily with a meal.     Cholecalciferol (VITAMIN D-3) 5000 units TABS Take 5,000 Units by mouth at bedtime.      clonazePAM (KLONOPIN) 1 MG tablet Take 1 mg by mouth at bedtime.      cyanocobalamin 500 MCG tablet Take 1,000 mcg by mouth at bedtime.      empagliflozin (JARDIANCE) 10 MG TABS tablet      fexofenadine (ALLEGRA) 180 MG tablet Take 180 mg by mouth daily as needed for allergies.      finasteride (PROSCAR) 5 MG tablet Take 1 tablet (5 mg total) by mouth daily. 90 tablet 3   insulin glargine (LANTUS) 100 UNIT/ML injection Inject 50 Units into the skin daily.     LORazepam (ATIVAN) 1 MG tablet Take 1 mg by mouth every 8 (eight) hours as needed for anxiety (takes 1 tablet every morning.  Rarely has to take again during the day).      Multiple Vitamins-Minerals (PRESERVISION AREDS 2 PO) Take 1 capsule by mouth 2 (two) times daily.      omega-3 acid ethyl esters (LOVAZA) 1 g capsule Take by mouth 2 (two) times daily.     tamsulosin (FLOMAX) 0.4 MG CAPS capsule Take 1 capsule (0.4 mg total) by mouth daily after supper. 90 capsule 3   telmisartan (MICARDIS) 40 MG tablet Take 40 mg by mouth in the morning and at bedtime.     triamcinolone (NASACORT) 55 MCG/ACT AERO nasal inhaler Place 2 sprays into the nose 2 (two) times daily as  needed (allergies).      No current facility-administered medications for this visit.   Facility-Administered Medications Ordered in Other Visits  Medication Dose Route Frequency Provider Last Rate Last Admin   epoetin alfa-epbx (RETACRIT) injection 40,000 Units  40,000 Units Subcutaneous Once Lloyd Huger, MD        OBJECTIVE: Vitals:   09/23/21 1039  BP: (!) 158/77  Pulse: 61  Resp: 16  Temp: (!) 97.2 F (36.2 C)  SpO2: 99%      Body mass index is 33.93 kg/m.    ECOG FS:0 - Asymptomatic  General: Well-developed, well-nourished, no acute distress. Eyes: Pink conjunctiva, anicteric sclera. HEENT: Normocephalic, moist mucous membranes. Lungs: No audible wheezing or coughing. Heart: Regular rate and rhythm. Abdomen: Soft, nontender, no obvious distention. Musculoskeletal: No edema, cyanosis, or clubbing. Neuro: Alert, answering all questions appropriately. Cranial nerves grossly intact. Skin:  No rashes or petechiae noted. Psych: Normal affect.  LAB RESULTS:  Lab Results  Component Value Date   NA 140 11/19/2019   K 3.9 11/19/2019   CL 109 11/19/2019   CO2 21 (L) 11/19/2019   GLUCOSE 159 (H) 11/19/2019   BUN 33 (H) 11/19/2019   CREATININE 1.89 (H) 11/19/2019   CALCIUM 9.6 11/19/2019   PROT 6.1 (L) 04/09/2017   ALBUMIN 2.5 (L) 04/17/2017   AST 22 04/09/2017   ALT 19 04/09/2017   ALKPHOS 76 04/09/2017   BILITOT 0.9 04/09/2017   GFRNONAA 32 (L) 11/19/2019   GFRAA 37 (L) 11/19/2019    Lab Results  Component Value Date   WBC 15.0 (H) 09/23/2021   NEUTROABS 2.4 09/23/2021   HGB 9.1 (L) 09/23/2021   HCT 28.9 (L) 09/23/2021   MCV 105.1 (H) 09/23/2021   PLT 53 (L) 09/23/2021   Lab Results  Component Value Date   IRON 100 08/25/2021   TIBC 333 08/25/2021   IRONPCTSAT 30 08/25/2021   Lab Results  Component Value Date   FERRITIN 31 08/25/2021     STUDIES: VAS [place] ABI WITH/WO TBI  Result Date: 08/27/2021  LOWER EXTREMITY DOPPLER STUDY Patient  Name:  Patrick Payne  Date of Exam:   08/25/2021 Medical Rec #: [id number]          Accession #:    [id number] Date of Birth: [birth year]         Patient Gender: M Patient Age:   87 years Exam Location:  [facility] Procedure:      VAS [place] ABI WITH/WO TBI Referring Phys: --------------------------------------------------------------------------------  High Risk Factors: Diabetes.  Performing Technologist: Concha Norway RVT  Examination Guidelines: A complete evaluation includes at minimum, Doppler waveform signals and systolic blood pressure reading at the level of bilateral brachial, anterior tibial, and posterior tibial arteries, when vessel segments are accessible. Bilateral testing is considered an integral part of a complete examination. Photoelectric Plethysmograph (PPG) waveforms and toe systolic pressure readings are included as required and additional duplex testing as needed. Limited examinations for reoccurring indications may be performed as noted.  ABI Findings: +---------+------------------+-----+---------+--------+  Right     Rt Pressure (mmHg) Index Waveform  Comment   +---------+------------------+-----+---------+--------+  Brachial  157                                          +---------+------------------+-----+---------+--------+  ATA       158                      triphasic 1.01      +---------+------------------+-----+---------+--------+  PTA       148                0.94  biphasic            +---------+------------------+-----+---------+--------+  Great Toe 77                 0.49  Abnormal            +---------+------------------+-----+---------+--------+ +---------+------------------+-----+---------+-------+  Left      Lt Pressure (mmHg) Index Waveform  Comment  +---------+------------------+-----+---------+-------+  Brachial  157                                         +---------+------------------+-----+---------+-------+  ATA       166                      biphasic  1.06      +---------+------------------+-----+---------+-------+  PTA       161                1.03  triphasic          +---------+------------------+-----+---------+-------+  Great Toe 110                0.70  Normal             +---------+------------------+-----+---------+-------+  Summary: Right: Resting right ankle-brachial index is within normal range. No evidence of significant right lower extremity arterial disease. The right toe-brachial index is abnormal. Left: Resting left ankle-brachial index is within normal range. No evidence of significant left lower extremity arterial disease. The left toe-brachial index is normal.  *See table(s) above for measurements and observations.  Electronically signed by Leotis Pain MD on 08/27/2021 at 2:00:30 PM.    Final     ASSESSMENT: Rai stage I CLL, anemia secondary to chronic renal insufficiency.  PLAN:    1. CLL: Patient's white blood cell count remains mildly elevated at 15.0 13.8, but this has been essentially unchanged for greater than 10 years.  He also has a chronic thrombocytopenia that is also unchanged.  Continue simple observation.   2. Thrombocytopenia: Chronic and unchanged for greater than 10 years.  Patient's platelet count is 53 today which is approximately his baseline.  He does not require bone marrow biopsy. 3.  Chronic renal insufficiency: Chronic and unchanged.  Continue monitoring and treatment per nephrology.    4. Lymphadenopathy: CT scan results from April 09, 2017 reviewed independently with enlarged pelvic and retroperitoneal lymph nodes. These are also noted on a scan in 2012 appear to be slightly larger. No intervention is needed. These are likely secondary to patient's underlying CLL.  No further imaging is necessary unless there is suspicion of progression of disease.   5.  Anemia secondary to chronic renal insufficiency: Patient's hemoglobin remains decreased at 9.1. Iron panel and B12 are within normal limits.  He was noted to have a mildly  decreased folate level.  Proceed with 40,000 units Retacrit today.  Return to clinic monthly for laboratory work and Retacrit if his hemoglobin remains below 10.0 and then in 4 months for further evaluation and continuation of treatment if needed.  I spent a total of 30 minutes reviewing chart data, face-to-face evaluation with the patient, counseling and coordination of care as detailed above.    Patient expressed understanding and was in agreement with this plan. He also understands that He can call clinic at any time with any questions, concerns, or complaints.   Lloyd Huger, MD   09/23/2021 5:36 PM

## 2021-09-28 DIAGNOSIS — I10 Essential (primary) hypertension: Secondary | ICD-10-CM | POA: Insufficient documentation

## 2021-09-28 DIAGNOSIS — N399 Disorder of urinary system, unspecified: Secondary | ICD-10-CM | POA: Insufficient documentation

## 2021-10-14 ENCOUNTER — Other Ambulatory Visit: Payer: Self-pay

## 2021-10-14 ENCOUNTER — Encounter: Payer: Self-pay | Admitting: Urology

## 2021-10-14 ENCOUNTER — Ambulatory Visit: Payer: Medicare Other | Admitting: Urology

## 2021-10-14 ENCOUNTER — Other Ambulatory Visit: Payer: Medicare Other

## 2021-10-14 VITALS — BP 152/76 | HR 58 | Ht 66.0 in | Wt 199.0 lb

## 2021-10-14 DIAGNOSIS — N200 Calculus of kidney: Secondary | ICD-10-CM | POA: Diagnosis not present

## 2021-10-14 DIAGNOSIS — R310 Gross hematuria: Secondary | ICD-10-CM

## 2021-10-14 NOTE — Progress Notes (Signed)
° °  10/14/2021 4:24 PM   Shelda Altes Atiyeh Jr. 1933-12-02 [id number]  Reason for visit: Possible kidney stones, debris in the urine, solitary left kidney, BPH, history of gross hematuria   HPI: 88 year old male with interesting urologic history.He had acute onset of renal failure in July 2018 and was found to have left hydronephrosis and a functionally solitary left kidney, with severe polycystic disease of the right kidney.  He underwent ureteral stent placement which improved his renal function, and ultimately underwent negative ureteroscopy, stent was removed, and hydronephrosis resolved.  He had a cystoscopy with an outside urologist a few days prior to developing the hydronephrosis, and he feels he developed an infection from this causing the hydronephrosis originally.  A follow-up ultrasound 06/09/2017 showed resolution of his hydronephrosis after stent removal.   He has a history of intermittent right-sided flank pain and gross hematuria suspected to be from rupture of right renal cyst in the setting of his polycystic right kidney.  He has deferred further cystoscopy in the past with his history of complications from cystoscopy with Dr. Eliberto Ivory.  Repeat renal ultrasounds have been reassuring, most recently in June 2022 showing a enlarged polycystic right kidney with no hydronephrosis, and no hydronephrosis on the left side and no stones.   He called to make an appointment for today as he passed a small amount of debris in the urine.  He thought this may have been stones, but it dissolved when he examined it closer.  He denies any gross hematuria or dysuria, but he does report some mild penile pressure.  Notably, he was recently diagnosed with COVID on 10/06/2021.  He denies any voiding complaints, flank pain, gross hematuria, and feels well today.  He continues to void yellow urine.  Urinalysis today is pending   We discussed possible etiologies at length, and suspect this may have been related  to his recent viral illness versus rupture of a right-sided renal cyst and old blood debris.  Call with urinalysis results, if microscopic hematuria recommend repeating in 1 month with history of recent viral illness   [name], MD  [facility] [address] [place], [place] [address] [phone]

## 2021-10-14 NOTE — Patient Instructions (Signed)
Will call with urine results

## 2021-10-15 ENCOUNTER — Telehealth: Payer: Self-pay

## 2021-10-15 LAB — URINALYSIS, COMPLETE
Bilirubin, UA: NEGATIVE
Glucose, UA: NEGATIVE
Ketones, UA: NEGATIVE
Leukocytes,UA: NEGATIVE
Nitrite, UA: NEGATIVE
Specific Gravity, UA: 1.015 (ref 1.005–1.030)
Urobilinogen, Ur: 0.2 mg/dL (ref 0.2–1.0)
pH, UA: 5.5 (ref 5.0–7.5)

## 2021-10-15 LAB — MICROSCOPIC EXAMINATION: Bacteria, UA: NONE SEEN

## 2021-10-15 NOTE — Telephone Encounter (Signed)
-----   Message from [name], MD sent at 10/15/2021  3:35 PM EST ----- Small amount of microscopic blood on urinalysis, please schedule follow-up in 6 weeks with repeat urinalysis to confirm clearance and PVR.  This is most likely related to his recent symptoms and recent COVID infection  Patrick Madrid, MD 10/15/2021

## 2021-10-15 NOTE — Telephone Encounter (Signed)
Called pt informed him of the information below. Pt voiced understanding. Appt scheduled.  

## 2021-10-22 ENCOUNTER — Ambulatory Visit (INDEPENDENT_AMBULATORY_CARE_PROVIDER_SITE_OTHER): Payer: Medicare Other | Admitting: Vascular Surgery

## 2021-10-22 ENCOUNTER — Ambulatory Visit (INDEPENDENT_AMBULATORY_CARE_PROVIDER_SITE_OTHER): Payer: Medicare Other

## 2021-10-22 ENCOUNTER — Other Ambulatory Visit: Payer: Self-pay

## 2021-10-22 ENCOUNTER — Encounter (INDEPENDENT_AMBULATORY_CARE_PROVIDER_SITE_OTHER): Payer: Self-pay | Admitting: Vascular Surgery

## 2021-10-22 VITALS — BP 138/66 | HR 57 | Resp 16 | Wt 201.0 lb

## 2021-10-22 DIAGNOSIS — I714 Abdominal aortic aneurysm, without rupture, unspecified: Secondary | ICD-10-CM | POA: Diagnosis not present

## 2021-10-22 DIAGNOSIS — E119 Type 2 diabetes mellitus without complications: Secondary | ICD-10-CM

## 2021-10-22 DIAGNOSIS — N183 Chronic kidney disease, stage 3 unspecified: Secondary | ICD-10-CM

## 2021-10-22 DIAGNOSIS — I7143 Infrarenal abdominal aortic aneurysm, without rupture: Secondary | ICD-10-CM | POA: Diagnosis not present

## 2021-10-22 DIAGNOSIS — I10 Essential (primary) hypertension: Secondary | ICD-10-CM

## 2021-10-22 NOTE — Progress Notes (Signed)
MRN : [id number]  Patrick Payne. is a 88 y.o. (1934-01-03) male who presents with chief complaint of  Chief Complaint  Patient presents with   Follow-up    Ultrasound follow up  .  History of Present Illness: Patient returns today in follow up of his aneurysm. He is doing well. No complaints today. No aneurysm related symptoms. Specifically, the patient denies new back or abdominal pain, or signs of peripheral embolization. Duplex shows a stable AAA just over 4 cm in maximal diameter.   Current Outpatient Medications  Medication Sig Dispense Refill   Aflibercept 2 MG/0.05ML SOLN Inject 1 Dose into the eye as directed. One injection into left eye every 8-9 weeks     allopurinol (ZYLOPRIM) 100 MG tablet Take 100 mg by mouth daily.     atorvastatin (LIPITOR) 10 MG tablet Take 10 mg by mouth at bedtime.      carvedilol (COREG) 12.5 MG tablet Take 12.5 mg by mouth 2 (two) times daily with a meal.     Cholecalciferol (VITAMIN D-3) 5000 units TABS Take 5,000 Units by mouth at bedtime.      clonazePAM (KLONOPIN) 1 MG tablet Take 1 mg by mouth at bedtime.      cyanocobalamin 500 MCG tablet Take 1,000 mcg by mouth at bedtime.      empagliflozin (JARDIANCE) 10 MG TABS tablet      fexofenadine (ALLEGRA) 180 MG tablet Take 180 mg by mouth daily as needed for allergies.      finasteride (PROSCAR) 5 MG tablet Take 1 tablet (5 mg total) by mouth daily. 90 tablet 3   insulin glargine (LANTUS) 100 UNIT/ML injection Inject 50 Units into the skin daily.     LORazepam (ATIVAN) 1 MG tablet Take 1 mg by mouth every 8 (eight) hours as needed for anxiety (takes 1 tablet every morning.  Rarely has to take again during the day).      Multiple Vitamins-Minerals (PRESERVISION AREDS 2 PO) Take 1 capsule by mouth 2 (two) times daily.      omega-3 acid ethyl esters (LOVAZA) 1 g capsule Take by mouth 2 (two) times daily.     tamsulosin (FLOMAX) 0.4 MG CAPS capsule Take 1 capsule (0.4 mg total) by mouth daily  after supper. 90 capsule 3   telmisartan (MICARDIS) 40 MG tablet Take 40 mg by mouth in the morning and at bedtime.     triamcinolone (NASACORT) 55 MCG/ACT AERO nasal inhaler Place 2 sprays into the nose 2 (two) times daily as needed (allergies).      No current facility-administered medications for this visit.   Facility-Administered Medications Ordered in Other Visits  Medication Dose Route Frequency Provider Last Rate Last Admin   epoetin alfa-epbx (RETACRIT) injection 40,000 Units  40,000 Units Subcutaneous Once Lloyd Huger, MD        Past Medical History:  Diagnosis Date   Anxiety    CKD (chronic kidney disease), stage III ([place])    CLL (chronic lymphocytic leukemia) ([place])    Diabetes mellitus without complication ([place])    History of kidney stones    HLD (hyperlipidemia)    HTN (hypertension)    PKD (polycystic kidney disease)    Sleep apnea     Past Surgical History:  Procedure Laterality Date   CARDIAC CATHETERIZATION  12/1987   CENTRAL LINE INSERTION N/A 04/11/2017   Procedure: Central Line Insertion;  Surgeon: Katha Cabal, MD;  Location: [facility];  Service: Cardiovascular;  Laterality: N/A;   CERVICAL FUSION     CHOLECYSTECTOMY     CYSTOSCOPY W/ RETROGRADES Left 05/12/2017   Procedure: CYSTOSCOPY WITH RETROGRADE PYELOGRAM;  Surgeon: Nickie Retort, MD;  Location: [facility];  Service: Urology;  Laterality: Left;   CYSTOSCOPY W/ URETERAL STENT PLACEMENT Left 05/12/2017   Procedure: CYSTOSCOPY WITH STENT REMOVAL;  Surgeon: Nickie Retort, MD;  Location: [facility];  Service: Urology;  Laterality: Left;   CYSTOSCOPY WITH STENT PLACEMENT Left 04/11/2017   Procedure: CYSTOSCOPY WITH STENT PLACEMENT;  Surgeon: Festus Aloe, MD;  Location: [facility];  Service: Urology;  Laterality: Left;   URETEROSCOPY  05/12/2017   Procedure: URETEROSCOPY;  Surgeon: Nickie Retort, MD;  Location: [facility];  Service: Urology;;     Social History   Tobacco  Use   Smoking status: Former   Smokeless tobacco: Never   Tobacco comments:    quit in Feb of 1997  Substance Use Topics   Alcohol use: No   Drug use: No      Family History  Problem Relation Age of Onset   Cancer Mother    Varicose Veins Mother    Cancer Father      Allergies  Allergen Reactions   Celecoxib Other (See Comments)    Increased Blood Pressure   Chlorpromazine Nausea Only   Prednisone Other (See Comments)    Diabetic   Amoxicillin-Pot Clavulanate Rash    Has patient had a PCN reaction causing immediate rash, facial/tongue/throat swelling, SOB or lightheadedness with hypotension: No Has patient had a PCN reaction causing severe rash involving mucus membranes or skin necrosis: No Has patient had a PCN reaction that required hospitalization: No Has patient had a PCN reaction occurring within the last 10 years: Yes If all of the above answers are "NO", then may proceed with Cephalosporin use.    Penicillin V Potassium Rash    REVIEW OF SYSTEMS (Negative unless checked)   Constitutional: [] Weight loss  [] Fever  [] Chills Cardiac: [] Chest pain   [] Chest pressure   [] Palpitations   [] Shortness of breath when laying flat   [] Shortness of breath at rest   [] Shortness of breath with exertion. Vascular:  [] Pain in legs with walking   [] Pain in legs at rest   [] Pain in legs when laying flat   [] Claudication   [] Pain in feet when walking  [] Pain in feet at rest  [] Pain in feet when laying flat   [] History of DVT   [] Phlebitis   [] Swelling in legs   [] Varicose veins   [] Non-healing ulcers Pulmonary:   [] Uses home oxygen   [] Productive cough   [] Hemoptysis   [] Wheeze  [] COPD   [] Asthma Neurologic:  [] Dizziness  [] Blackouts   [] Seizures   [] History of stroke   [] History of TIA  [] Aphasia   [] Temporary blindness   [] Dysphagia   [] Weakness or numbness in arms   [] Weakness or numbness in legs Musculoskeletal:  [x] Arthritis   [] Joint swelling   [x] Joint pain   [] Low back  pain Hematologic:  [] Easy bruising  [] Easy bleeding   [] Hypercoagulable state   [] Anemic   Gastrointestinal:  [] Blood in stool   [] Vomiting blood  [] Gastroesophageal reflux/heartburn   [] Abdominal pain Genitourinary:  [x] Chronic kidney disease   [] Difficult urination  [] Frequent urination  [] Burning with urination   [] Hematuria Skin:  [] Rashes   [] Ulcers   [] Wounds Psychological:  [x] History of anxiety   []  History of major depression.  Physical Examination  BP 138/66 (BP Location: Left Arm)  Pulse (!) 57    Resp 16    Wt 201 lb (91.2 kg)    BMI 32.44 kg/m  Gen:  WD/WN, NAD Head: [place]/AT, No temporalis wasting. Ear/Nose/Throat: Hearing grossly intact, nares w/o erythema or drainage Eyes: Conjunctiva clear. Sclera non-icteric Neck: Supple.  Trachea midline Pulmonary:  Good air movement, no use of accessory muscles.  Cardiac: RRR, no JVD Vascular:  Vessel Right Left  Radial Palpable Palpable                                   Gastrointestinal: soft, non-tender/non-distended. No increased aortic impulse Musculoskeletal: M/S 5/5 throughout.  No deformity or atrophy. Trace LE edema. Neurologic: Sensation grossly intact in extremities.  Symmetrical.  Speech is fluent.  Psychiatric: Judgment intact, Mood & affect appropriate for pt's clinical situation. Dermatologic: No rashes or ulcers noted.  No cellulitis or open wounds.      Labs Recent Results (from the past 2160 hour(s))  DAT, polyspecific, [facility])     Status: None   Collection Time: 08/25/21 11:16 AM  Result Value Ref Range   Polyspecific [facility] test      NEG Performed at [facility], [address] [place], [place] [address]   Vitamin B12     Status: Abnormal   Collection Time: 08/25/21 11:16 AM  Result Value Ref Range   Vitamin B-12 1,281 (H) 180 - 914 pg/mL    Comment: (NOTE) This assay is not validated for testing neonatal or myeloproliferative syndrome specimens for Vitamin B12 levels. Performed  at [facility], [address] [place], [place] [address]   Folic Acid     Status: Abnormal   Collection Time: 08/25/21 11:16 AM  Result Value Ref Range   Folate 3.5 (L) >5.9 ng/mL    Comment: Performed at [facility], [address] [place], [place] [address]  Haptoglobin     Status: None   Collection Time: 08/25/21 11:16 AM  Result Value Ref Range   Haptoglobin 87 38 - 329 mg/dL    Comment: (NOTE) Performed At: [facility] [place], [place] [id number] Rush Farmer MD OZ:[id number]   Erythropoietin     Status: Abnormal   Collection Time: 08/25/21 11:16 AM  Result Value Ref Range   Erythropoietin 31.0 (H) 2.6 - 18.5 mIU/mL    Comment: (NOTE) [employer] UniCel DxI 800 Immunoassay System Values obtained with different assay methods or kits cannot be used interchangeably. Results cannot be interpreted as absolute evidence of the presence or absence of malignant disease. Performed At: [facility] [place], [place] [id number] Rush Farmer MD KG:[id number]   Ferritin     Status: None   Collection Time: 08/25/21 11:16 AM  Result Value Ref Range   Ferritin 31 24 - 336 ng/mL    Comment: Performed at [facility], [address] [place],  [address]  Lactate dehydrogenase     Status: Abnormal   Collection Time: 08/25/21 11:16 AM  Result Value Ref Range   LDH 193 (H) 98 - 192 U/L    Comment: Performed at [facility], [place], [place] [address]  Iron and TIBC     Status: None   Collection Time: 08/25/21 11:16 AM  Result Value Ref Range   Iron 100 45 - 182 ug/dL   TIBC 333 250 - 450 ug/dL   Saturation Ratios  30 17.9 - 39.5 %   UIBC 233 ug/dL    Comment: Performed at [facility], [address] [place], [place] [address]  Reticulocytes     Status: Abnormal   Collection Time: 08/25/21 11:16 AM  Result Value Ref Range   Retic Ct Pct 1.1 0.4 - 3.1 %   RBC.  2.72 (L) 4.22 - 5.81 MIL/uL   Retic Count, Absolute 28.6 19.0 - 186.0 K/uL   Immature Retic Fract 16.3 (H) 2.3 - 15.9 %    Comment: Performed at [facility], [address] [place], [place] [address]  CBC     Status: Abnormal   Collection Time: 08/25/21 11:16 AM  Result Value Ref Range   WBC 13.8 (H) 4.0 - 10.5 K/uL   RBC 2.81 (L) 4.22 - 5.81 MIL/uL   Hemoglobin 9.2 (L) 13.0 - 17.0 g/dL   HCT 29.0 (L) 39.0 - 52.0 %   MCV 103.2 (H) 80.0 - 100.0 fL   MCH 32.7 26.0 - 34.0 pg   MCHC 31.7 30.0 - 36.0 g/dL   RDW 14.4 11.5 - 15.5 %   Platelets 45 (L) 150 - 400 K/uL    Comment: SPECIMEN CHECKED FOR CLOTS Immature Platelet Fraction may be clinically indicated, consider ordering this additional test [id number]    nRBC 0.0 0.0 - 0.2 %    Comment: Performed at [facility], [address] [place], [place] [address]  CBC with Differential     Status: Abnormal   Collection Time: 09/23/21 10:18 AM  Result Value Ref Range   WBC 15.0 (H) 4.0 - 10.5 K/uL   RBC 2.75 (L) 4.22 - 5.81 MIL/uL   Hemoglobin 9.1 (L) 13.0 - 17.0 g/dL   HCT 28.9 (L) 39.0 - 52.0 %   MCV 105.1 (H) 80.0 - 100.0 fL   MCH 33.1 26.0 - 34.0 pg   MCHC 31.5 30.0 - 36.0 g/dL   RDW 14.6 11.5 - 15.5 %   Platelets 53 (L) 150 - 400 K/uL   nRBC 0.0 0.0 - 0.2 %   Neutrophils Relative % 16 %   Neutro Abs 2.4 1.7 - 7.7 K/uL   Lymphocytes Relative 74 %   Lymphs Abs 11.1 (H) 0.7 - 4.0 K/uL   Monocytes Relative 9 %   Monocytes Absolute 1.4 (H) 0.1 - 1.0 K/uL   Eosinophils Relative 1 %   Eosinophils Absolute 0.1 0.0 - 0.5 K/uL   Basophils Relative 0 %   Basophils Absolute 0.0 0.0 - 0.1 K/uL   WBC Morphology      DIFF CONFIRMED BY MANUAL. CONSISTANT WITH KNOWN CLL.   RBC Morphology MIXED RBC POPULATION    Smear Review Normal platelet morphology     Comment: PLATELETS APPEAR DECREASED   Immature Granulocytes 0 %   Abs Immature Granulocytes 0.02 0.00 - 0.07 K/uL    Comment: Performed at [facility], [address] [place], [place] [address]  Urinalysis, Complete     Status: Abnormal   Collection Time: 10/14/21  2:23 PM  Result Value Ref Range   Specific Gravity, UA 1.015 1.005 - 1.030   pH, UA 5.5 5.0 - 7.5   Color, UA Yellow Yellow   Appearance Ur Clear Clear   Leukocytes,UA Negative Negative   Protein,UA 3+ (A) Negative/Trace   Glucose, UA Negative Negative   Ketones, UA Negative Negative   RBC, UA 3+ (A) Negative   Bilirubin, UA Negative Negative   Urobilinogen, Ur 0.2 0.2 -  1.0 mg/dL   Nitrite, UA Negative Negative   Microscopic Examination See below:   Microscopic Examination     Status: Abnormal   Collection Time: 10/14/21  2:23 PM   Urine  Result Value Ref Range   WBC, UA 0-5 0 - 5 /hpf   RBC 11-30 (A) 0 - 2 /hpf   Epithelial Cells (non renal) 0-10 0 - 10 /hpf   Casts Present (A) None seen /lpf   Cast Type Granular casts (A) N/A   Bacteria, UA None seen None seen/Few    Radiology No results found.  Assessment/Plan Diabetes mellitus ([place]) blood glucose control important in reducing the progression of atherosclerotic disease. Also, involved in wound healing. On appropriate medications.     BP (high blood pressure) blood pressure control important in reducing the progression of atherosclerotic disease and aneurysmal disease. On appropriate oral medications.   Chronic kidney disease (CKD), stage III (moderate) Avoid contrast with CT scan unless size appears to be in the range to need repair.  Abdominal aortic aneurysm (AAA) (HCC) Duplex shows a stable AAA just over 4 cm in maximal diameter.  Continue to follow on 72-month intervals.  No role for intervention at this time.    Leotis Pain, MD  10/22/2021 10:30 AM    This note was created with Dragon medical transcription system.  Any errors from dictation are purely unintentional

## 2021-10-22 NOTE — Assessment & Plan Note (Addendum)
Duplex shows a stable AAA just over 4 cm in maximal diameter.  Continue to follow on 11-month intervals.  No role for intervention at this time.

## 2021-10-26 ENCOUNTER — Inpatient Hospital Stay: Payer: Medicare Other | Attending: Oncology

## 2021-10-26 ENCOUNTER — Inpatient Hospital Stay: Payer: Medicare Other

## 2021-10-26 ENCOUNTER — Other Ambulatory Visit: Payer: Self-pay

## 2021-10-26 VITALS — BP 114/68 | HR 62

## 2021-10-26 DIAGNOSIS — N183 Chronic kidney disease, stage 3 unspecified: Secondary | ICD-10-CM | POA: Diagnosis not present

## 2021-10-26 DIAGNOSIS — C9110 Chronic lymphocytic leukemia of B-cell type not having achieved remission: Secondary | ICD-10-CM | POA: Diagnosis present

## 2021-10-26 DIAGNOSIS — D631 Anemia in chronic kidney disease: Secondary | ICD-10-CM | POA: Diagnosis present

## 2021-10-26 DIAGNOSIS — N189 Chronic kidney disease, unspecified: Secondary | ICD-10-CM

## 2021-10-26 LAB — CBC WITH DIFFERENTIAL/PLATELET
Abs Immature Granulocytes: 0.01 10*3/uL (ref 0.00–0.07)
Basophils Absolute: 0 10*3/uL (ref 0.0–0.1)
Basophils Relative: 0 %
Eosinophils Absolute: 0.1 10*3/uL (ref 0.0–0.5)
Eosinophils Relative: 1 %
HCT: 28.6 % — ABNORMAL LOW (ref 39.0–52.0)
Hemoglobin: 9.1 g/dL — ABNORMAL LOW (ref 13.0–17.0)
Immature Granulocytes: 0 %
Lymphocytes Relative: 68 %
Lymphs Abs: 9.2 10*3/uL — ABNORMAL HIGH (ref 0.7–4.0)
MCH: 33.3 pg (ref 26.0–34.0)
MCHC: 31.8 g/dL (ref 30.0–36.0)
MCV: 104.8 fL — ABNORMAL HIGH (ref 80.0–100.0)
Monocytes Absolute: 1.4 10*3/uL — ABNORMAL HIGH (ref 0.1–1.0)
Monocytes Relative: 11 %
Neutro Abs: 2.7 10*3/uL (ref 1.7–7.7)
Neutrophils Relative %: 20 %
Platelets: 47 10*3/uL — ABNORMAL LOW (ref 150–400)
RBC: 2.73 MIL/uL — ABNORMAL LOW (ref 4.22–5.81)
RDW: 14.4 % (ref 11.5–15.5)
Smear Review: NORMAL
WBC: 13.5 10*3/uL — ABNORMAL HIGH (ref 4.0–10.5)
nRBC: 0 % (ref 0.0–0.2)

## 2021-10-26 MED ORDER — EPOETIN ALFA-EPBX 40000 UNIT/ML IJ SOLN
40000.0000 [IU] | Freq: Once | INTRAMUSCULAR | Status: AC
  Administered 2021-10-26: 40000 [IU] via SUBCUTANEOUS
  Filled 2021-10-26: qty 1

## 2021-11-25 ENCOUNTER — Encounter: Payer: Self-pay | Admitting: Urology

## 2021-11-25 ENCOUNTER — Other Ambulatory Visit: Payer: Self-pay

## 2021-11-25 ENCOUNTER — Ambulatory Visit: Payer: Medicare Other | Admitting: Urology

## 2021-11-25 VITALS — BP 118/67 | HR 61 | Ht 66.0 in | Wt 205.3 lb

## 2021-11-25 DIAGNOSIS — Q600 Renal agenesis, unilateral: Secondary | ICD-10-CM | POA: Diagnosis not present

## 2021-11-25 DIAGNOSIS — Q613 Polycystic kidney, unspecified: Secondary | ICD-10-CM

## 2021-11-25 DIAGNOSIS — R3121 Asymptomatic microscopic hematuria: Secondary | ICD-10-CM | POA: Diagnosis not present

## 2021-11-25 DIAGNOSIS — Z87448 Personal history of other diseases of urinary system: Secondary | ICD-10-CM

## 2021-11-25 DIAGNOSIS — N401 Enlarged prostate with lower urinary tract symptoms: Secondary | ICD-10-CM

## 2021-11-25 LAB — MICROSCOPIC EXAMINATION
Bacteria, UA: NONE SEEN
RBC, Urine: NONE SEEN /hpf (ref 0–2)

## 2021-11-25 LAB — URINALYSIS, COMPLETE
Bilirubin, UA: NEGATIVE
Ketones, UA: NEGATIVE
Leukocytes,UA: NEGATIVE
Nitrite, UA: NEGATIVE
Specific Gravity, UA: 1.02 (ref 1.005–1.030)
Urobilinogen, Ur: 0.2 mg/dL (ref 0.2–1.0)
pH, UA: 5.5 (ref 5.0–7.5)

## 2021-11-25 NOTE — Progress Notes (Signed)
° °  11/25/2021 12:49 PM   Patrick Altes Ribaudo Jr. Dec 27, 1933 [id number]  Reason for visit: Microscopic hematuria, solitary kidney   HPI: 88 year old male with interesting urologic history.He had acute onset of renal failure in July 2018 and was found to have left hydronephrosis and a functionally solitary left kidney, with severe polycystic disease of the right kidney.  He underwent ureteral stent placement which improved his renal function, and ultimately underwent negative ureteroscopy, stent was removed, and hydronephrosis resolved.  He had a cystoscopy with an outside urologist a few days prior to developing the hydronephrosis, and he feels he developed an infection from this causing the hydronephrosis originally.  A follow-up ultrasound 06/09/2017 showed resolution of his hydronephrosis after stent removal.   He has a history of intermittent right-sided flank pain and gross hematuria suspected to be from rupture of right renal cyst in the setting of his polycystic right kidney.  He has deferred further cystoscopy in the past with his history of complications from cystoscopy with Dr. Eliberto Ivory.  Repeat renal ultrasounds have been reassuring, most recently in June 2022 showing a enlarged polycystic right kidney with no hydronephrosis, and no hydronephrosis on the left side and no stones.   I saw him last on 10/14/2021 when he had a small amount of debris in the urine that he felt might have been a stone, but dissolved when he examined it closer.  He was not having any gross hematuria.  He recently had been diagnosed with COVID just a week prior.  He felt like this may have been related to a ruptured cyst from his polycystic kidney.  Urinalysis at that time showed 11-30 RBCs, and he opted for a repeat urinalysis in 1 month.  He has done well over the last month and really denies any urinary complaints today.  Urinalysis today is pending.  He would like to avoid repeat cystoscopy if at all possible, with his  history of hydronephrosis/infection after prior cystoscopy with Dr. Eliberto Ivory.  I think this is very reasonable.  I recommended at the very least a repeat renal ultrasound in 6 months for further evaluation of the bladder kidneys.  Follow-up urinalysis results RTC 6 months renal/bladder ultrasound call with results   [name], [place] [address] [place],  [address] [phone]

## 2021-11-29 ENCOUNTER — Inpatient Hospital Stay: Payer: Medicare Other | Attending: Oncology

## 2021-11-29 ENCOUNTER — Other Ambulatory Visit: Payer: Self-pay

## 2021-11-29 ENCOUNTER — Inpatient Hospital Stay: Payer: Medicare Other

## 2021-11-29 VITALS — BP 138/77 | HR 55

## 2021-11-29 DIAGNOSIS — D631 Anemia in chronic kidney disease: Secondary | ICD-10-CM | POA: Insufficient documentation

## 2021-11-29 DIAGNOSIS — N189 Chronic kidney disease, unspecified: Secondary | ICD-10-CM

## 2021-11-29 DIAGNOSIS — N183 Chronic kidney disease, stage 3 unspecified: Secondary | ICD-10-CM | POA: Insufficient documentation

## 2021-11-29 DIAGNOSIS — C9110 Chronic lymphocytic leukemia of B-cell type not having achieved remission: Secondary | ICD-10-CM | POA: Insufficient documentation

## 2021-11-29 LAB — CBC WITH DIFFERENTIAL/PLATELET
Abs Immature Granulocytes: 0.01 10*3/uL (ref 0.00–0.07)
Basophils Absolute: 0 10*3/uL (ref 0.0–0.1)
Basophils Relative: 0 %
Eosinophils Absolute: 0.2 10*3/uL (ref 0.0–0.5)
Eosinophils Relative: 1 %
HCT: 30.2 % — ABNORMAL LOW (ref 39.0–52.0)
Hemoglobin: 9.5 g/dL — ABNORMAL LOW (ref 13.0–17.0)
Immature Granulocytes: 0 %
Lymphocytes Relative: 70 %
Lymphs Abs: 10 10*3/uL — ABNORMAL HIGH (ref 0.7–4.0)
MCH: 33 pg (ref 26.0–34.0)
MCHC: 31.5 g/dL (ref 30.0–36.0)
MCV: 104.9 fL — ABNORMAL HIGH (ref 80.0–100.0)
Monocytes Absolute: 1.4 10*3/uL — ABNORMAL HIGH (ref 0.1–1.0)
Monocytes Relative: 10 %
Neutro Abs: 2.7 10*3/uL (ref 1.7–7.7)
Neutrophils Relative %: 19 %
Platelets: 41 10*3/uL — ABNORMAL LOW (ref 150–400)
RBC: 2.88 MIL/uL — ABNORMAL LOW (ref 4.22–5.81)
RDW: 14.7 % (ref 11.5–15.5)
Smear Review: NORMAL
WBC: 14.2 10*3/uL — ABNORMAL HIGH (ref 4.0–10.5)
nRBC: 0 % (ref 0.0–0.2)

## 2021-11-29 MED ORDER — EPOETIN ALFA-EPBX 40000 UNIT/ML IJ SOLN
40000.0000 [IU] | Freq: Once | INTRAMUSCULAR | Status: AC
  Administered 2021-11-29: 40000 [IU] via SUBCUTANEOUS
  Filled 2021-11-29: qty 1

## 2021-12-01 ENCOUNTER — Ambulatory Visit: Payer: Medicare Other | Admitting: Urology

## 2021-12-27 ENCOUNTER — Other Ambulatory Visit: Payer: Self-pay

## 2021-12-27 ENCOUNTER — Inpatient Hospital Stay: Payer: Medicare Other

## 2021-12-27 ENCOUNTER — Inpatient Hospital Stay: Payer: Medicare Other | Attending: Oncology

## 2021-12-27 VITALS — BP 120/77 | HR 61

## 2021-12-27 DIAGNOSIS — N183 Chronic kidney disease, stage 3 unspecified: Secondary | ICD-10-CM | POA: Diagnosis not present

## 2021-12-27 DIAGNOSIS — D631 Anemia in chronic kidney disease: Secondary | ICD-10-CM | POA: Insufficient documentation

## 2021-12-27 DIAGNOSIS — C9110 Chronic lymphocytic leukemia of B-cell type not having achieved remission: Secondary | ICD-10-CM

## 2021-12-27 LAB — CBC WITH DIFFERENTIAL/PLATELET
Abs Immature Granulocytes: 0.02 10*3/uL (ref 0.00–0.07)
Basophils Absolute: 0 10*3/uL (ref 0.0–0.1)
Basophils Relative: 0 %
Eosinophils Absolute: 0.2 10*3/uL (ref 0.0–0.5)
Eosinophils Relative: 1 %
HCT: 29.7 % — ABNORMAL LOW (ref 39.0–52.0)
Hemoglobin: 9.3 g/dL — ABNORMAL LOW (ref 13.0–17.0)
Immature Granulocytes: 0 %
Lymphocytes Relative: 69 %
Lymphs Abs: 9.7 10*3/uL — ABNORMAL HIGH (ref 0.7–4.0)
MCH: 32.7 pg (ref 26.0–34.0)
MCHC: 31.3 g/dL (ref 30.0–36.0)
MCV: 104.6 fL — ABNORMAL HIGH (ref 80.0–100.0)
Monocytes Absolute: 1.4 10*3/uL — ABNORMAL HIGH (ref 0.1–1.0)
Monocytes Relative: 10 %
Neutro Abs: 2.8 10*3/uL (ref 1.7–7.7)
Neutrophils Relative %: 20 %
Platelets: 44 10*3/uL — ABNORMAL LOW (ref 150–400)
RBC: 2.84 MIL/uL — ABNORMAL LOW (ref 4.22–5.81)
RDW: 14.9 % (ref 11.5–15.5)
Smear Review: NORMAL
WBC: 14 10*3/uL — ABNORMAL HIGH (ref 4.0–10.5)
nRBC: 0 % (ref 0.0–0.2)

## 2021-12-27 MED ORDER — EPOETIN ALFA-EPBX 40000 UNIT/ML IJ SOLN
40000.0000 [IU] | Freq: Once | INTRAMUSCULAR | Status: AC
  Administered 2021-12-27: 40000 [IU] via SUBCUTANEOUS
  Filled 2021-12-27: qty 1

## 2022-01-24 NOTE — Progress Notes (Signed)
?[facility]  ?[phone] [phone] ? ?ID: Patrick Altes Murthy Jr. OB: 1934/10/08  MR#: [id number]  IOX#:[id number] ? ?Patient Care Team: ?Idelle Crouch, MD as PCP - General (Internal Medicine) ?Lloyd Huger, MD as Consulting Physician (Oncology) ? ?CHIEF COMPLAINT: CLL, anemia secondary to chronic renal insufficiency ? ?INTERVAL HISTORY: Patient returns to clinic today for further evaluation and continuation of Retacrit.  He continues to feel well and remains asymptomatic.  He has not noticed a significant difference in his energy level since initiating treatment.  He does not complain of any weakness or fatigue. He denies any fevers, night sweats, or weight loss. He has noted no new lymphadenopathy. He has no neurologic complaints.  He denies any chest pain, shortness of breath, cough, or hemoptysis.  He denies any nausea, vomiting, constipation, or diarrhea. He has no urinary complaints.  Patient offers no specific complaints today. ? ?REVIEW OF SYSTEMS:   ?Review of Systems  ?Constitutional: Negative.  Negative for diaphoresis, fever, malaise/fatigue and weight loss.  ?Respiratory: Negative.  Negative for cough and shortness of breath.   ?Cardiovascular: Negative.  Negative for chest pain and leg swelling.  ?Gastrointestinal: Negative.  Negative for abdominal pain, blood in stool and melena.  ?Genitourinary: Negative.  Negative for dysuria.  ?Musculoskeletal: Negative.  Negative for back pain.  ?Skin: Negative.  Negative for rash.  ?Neurological: Negative.  Negative for dizziness, sensory change, focal weakness, weakness and headaches.  ?Psychiatric/Behavioral: Negative.  The patient is not nervous/anxious.   ? ?As per HPI. Otherwise, a complete review of systems is negative. ? ?PAST MEDICAL HISTORY: ?Past Medical History:  ?Diagnosis Date  ? Anxiety   ? CKD (chronic kidney disease), stage III ([place])   ? CLL (chronic lymphocytic leukemia) ([place])   ? Diabetes  mellitus without complication ([place])   ? History of kidney stones   ? HLD (hyperlipidemia)   ? HTN (hypertension)   ? PKD (polycystic kidney disease)   ? Sleep apnea   ? ? ?PAST SURGICAL HISTORY: ?Past Surgical History:  ?Procedure Laterality Date  ? CARDIAC CATHETERIZATION  12/1987  ? CENTRAL LINE INSERTION N/A 04/11/2017  ? Procedure: Central Line Insertion;  Surgeon: Delana Meyer, Dolores Lory, MD;  Location: [facility];  Service: Cardiovascular;  Laterality: N/A;  ? CERVICAL FUSION    ? CHOLECYSTECTOMY    ? CYSTOSCOPY W/ RETROGRADES Left 05/12/2017  ? Procedure: CYSTOSCOPY WITH RETROGRADE PYELOGRAM;  Surgeon: Nickie Retort, MD;  Location: [facility];  Service: Urology;  Laterality: Left;  ? CYSTOSCOPY W/ URETERAL STENT PLACEMENT Left 05/12/2017  ? Procedure: CYSTOSCOPY WITH STENT REMOVAL;  Surgeon: Nickie Retort, MD;  Location: [facility];  Service: Urology;  Laterality: Left;  ? CYSTOSCOPY WITH STENT PLACEMENT Left 04/11/2017  ? Procedure: CYSTOSCOPY WITH STENT PLACEMENT;  Surgeon: Festus Aloe, MD;  Location: [facility];  Service: Urology;  Laterality: Left;  ? URETEROSCOPY  05/12/2017  ? Procedure: URETEROSCOPY;  Surgeon: Nickie Retort, MD;  Location: [facility];  Service: Urology;;  ? ? ?FAMILY HISTORY: Reported history of ovarian and lung cancer. Diabetes, hypertension. ? ?  ? ADVANCED DIRECTIVES:  ? ? ?HEALTH MAINTENANCE: ?Social History  ? ?Tobacco Use  ? Smoking status: Former  ? Smokeless tobacco: Never  ? Tobacco comments:  ?  quit in Feb of 1997  ?Substance Use Topics  ? Alcohol use: No  ? Drug use: No  ? ? ? Colonoscopy: ? PAP: ? Bone density: ? Lipid panel: ? ?Allergies  ?  Allergen Reactions  ? Celecoxib Other (See Comments)  ?  Increased Blood Pressure  ? Chlorpromazine Nausea Only  ? Prednisone Other (See Comments)  ?  Diabetic  ? Amoxicillin-Pot Clavulanate Rash  ?  Has patient had a PCN reaction causing immediate rash, facial/tongue/throat swelling, SOB or lightheadedness with hypotension:  No ?Has patient had a PCN reaction causing severe rash involving mucus membranes or skin necrosis: No ?Has patient had a PCN reaction that required hospitalization: No ?Has patient had a PCN reaction occurring within the last 10 years: Yes ?If all of the above answers are "NO", then may proceed with Cephalosporin use. ?  ? Penicillin V Potassium Rash  ? ? ?Current Outpatient Medications  ?Medication Sig Dispense Refill  ? Aflibercept 2 MG/0.05ML SOLN Inject 1 Dose into the eye as directed. One injection into left eye every 8-9 weeks    ? allopurinol (ZYLOPRIM) 100 MG tablet Take 100 mg by mouth daily.    ? atorvastatin (LIPITOR) 10 MG tablet Take 10 mg by mouth at bedtime.     ? carvedilol (COREG) 12.5 MG tablet Take 12.5 mg by mouth 2 (two) times daily with a meal.    ? Cholecalciferol (VITAMIN D-3) 5000 units TABS Take 5,000 Units by mouth at bedtime.     ? clonazePAM (KLONOPIN) 1 MG tablet Take 1 mg by mouth at bedtime.     ? cyanocobalamin 500 MCG tablet Take 1,000 mcg by mouth at bedtime.     ? empagliflozin (JARDIANCE) 10 MG TABS tablet     ? fexofenadine (ALLEGRA) 180 MG tablet Take 180 mg by mouth daily as needed for allergies.     ? finasteride (PROSCAR) 5 MG tablet Take 1 tablet (5 mg total) by mouth daily. 90 tablet 3  ? insulin glargine (LANTUS) 100 UNIT/ML injection Inject 50 Units into the skin daily.    ? LORazepam (ATIVAN) 1 MG tablet Take 1 mg by mouth every 8 (eight) hours as needed for anxiety (takes 1 tablet every morning.  Rarely has to take again during the day).     ? Multiple Vitamins-Minerals (PRESERVISION AREDS 2 PO) Take 1 capsule by mouth 2 (two) times daily.     ? omega-3 acid ethyl esters (LOVAZA) 1 g capsule Take by mouth 2 (two) times daily.    ? tamsulosin (FLOMAX) 0.4 MG CAPS capsule Take 1 capsule (0.4 mg total) by mouth daily after supper. 90 capsule 3  ? telmisartan (MICARDIS) 40 MG tablet Take 40 mg by mouth in the morning and at bedtime.    ? triamcinolone (NASACORT) 55 MCG/ACT  AERO nasal inhaler Place 2 sprays into the nose 2 (two) times daily as needed (allergies).     ? ?No current facility-administered medications for this visit.  ? ?Facility-Administered Medications Ordered in Other Visits  ?Medication Dose Route Frequency Provider Last Rate Last Admin  ? epoetin alfa-epbx (RETACRIT) injection 40,000 Units  40,000 Units Subcutaneous Once Lloyd Huger, MD      ? ? ?OBJECTIVE: ?Vitals:  ? 01/27/22 1420  ?BP: (!) 123/59  ?Pulse: 72  ?Resp: 16  ?Temp: (!) 96.7 ?F (35.9 ?C)  ?SpO2: 100%  ? ?   Body mass index is 33.41 kg/m?Marland Kitchen    ECOG FS:0 - Asymptomatic ? ?General: Well-developed, well-nourished, no acute distress. ?Eyes: Pink conjunctiva, anicteric sclera. ?HEENT: Normocephalic, moist mucous membranes. ?Lungs: No audible wheezing or coughing. ?Heart: Regular rate and rhythm. ?Abdomen: Soft, nontender, no obvious distention. ?Musculoskeletal: No edema, cyanosis, or clubbing. ?  Neuro: Alert, answering all questions appropriately. Cranial nerves grossly intact. ?Skin: No rashes or petechiae noted. ?Psych: Normal affect. ? ?LAB RESULTS: ? ?Lab Results  ?Component Value Date  ? NA 140 11/19/2019  ? K 3.9 11/19/2019  ? CL 109 11/19/2019  ? CO2 21 (L) 11/19/2019  ? GLUCOSE 159 (H) 11/19/2019  ? BUN 33 (H) 11/19/2019  ? CREATININE 1.89 (H) 11/19/2019  ? CALCIUM 9.6 11/19/2019  ? PROT 6.1 (L) 04/09/2017  ? ALBUMIN 2.5 (L) 04/17/2017  ? AST 22 04/09/2017  ? ALT 19 04/09/2017  ? ALKPHOS 76 04/09/2017  ? BILITOT 0.9 04/09/2017  ? GFRNONAA 32 (L) 11/19/2019  ? GFRAA 37 (L) 11/19/2019  ? ? ?Lab Results  ?Component Value Date  ? WBC 15.2 (H) 01/27/2022  ? NEUTROABS 2.9 01/27/2022  ? HGB 9.1 (L) 01/27/2022  ? HCT 28.3 (L) 01/27/2022  ? MCV 104.4 (H) 01/27/2022  ? PLT 48 (L) 01/27/2022  ? ?Lab Results  ?Component Value Date  ? IRON 100 08/25/2021  ? TIBC 333 08/25/2021  ? IRONPCTSAT 30 08/25/2021  ? ?Lab Results  ?Component Value Date  ? FERRITIN 31 08/25/2021  ? ? ? ?STUDIES: ?No results  found. ? ?ASSESSMENT: [place] stage I CLL, anemia secondary to chronic renal insufficiency. ? ?PLAN:   ? ?1. CLL: Patient's white blood cell remains mildly elevated at 15.2.  This has essentially remained unchanged for

## 2022-01-27 ENCOUNTER — Inpatient Hospital Stay: Payer: Medicare Other | Attending: Oncology

## 2022-01-27 ENCOUNTER — Encounter: Payer: Self-pay | Admitting: Oncology

## 2022-01-27 ENCOUNTER — Inpatient Hospital Stay: Payer: Medicare Other

## 2022-01-27 ENCOUNTER — Inpatient Hospital Stay: Payer: Medicare Other | Admitting: Oncology

## 2022-01-27 VITALS — BP 123/59 | HR 72 | Temp 96.7°F | Resp 16 | Ht 66.0 in | Wt 207.0 lb

## 2022-01-27 DIAGNOSIS — Z79899 Other long term (current) drug therapy: Secondary | ICD-10-CM | POA: Diagnosis not present

## 2022-01-27 DIAGNOSIS — D631 Anemia in chronic kidney disease: Secondary | ICD-10-CM

## 2022-01-27 DIAGNOSIS — C9110 Chronic lymphocytic leukemia of B-cell type not having achieved remission: Secondary | ICD-10-CM | POA: Diagnosis present

## 2022-01-27 DIAGNOSIS — N189 Chronic kidney disease, unspecified: Secondary | ICD-10-CM | POA: Diagnosis not present

## 2022-01-27 DIAGNOSIS — N1832 Chronic kidney disease, stage 3b: Secondary | ICD-10-CM | POA: Diagnosis present

## 2022-01-27 LAB — CBC WITH DIFFERENTIAL/PLATELET
Abs Immature Granulocytes: 0.01 10*3/uL (ref 0.00–0.07)
Basophils Absolute: 0 10*3/uL (ref 0.0–0.1)
Basophils Relative: 0 %
Eosinophils Absolute: 0.2 10*3/uL (ref 0.0–0.5)
Eosinophils Relative: 1 %
HCT: 28.3 % — ABNORMAL LOW (ref 39.0–52.0)
Hemoglobin: 9.1 g/dL — ABNORMAL LOW (ref 13.0–17.0)
Immature Granulocytes: 0 %
Lymphocytes Relative: 71 %
Lymphs Abs: 10.7 10*3/uL — ABNORMAL HIGH (ref 0.7–4.0)
MCH: 33.6 pg (ref 26.0–34.0)
MCHC: 32.2 g/dL (ref 30.0–36.0)
MCV: 104.4 fL — ABNORMAL HIGH (ref 80.0–100.0)
Monocytes Absolute: 1.4 10*3/uL — ABNORMAL HIGH (ref 0.1–1.0)
Monocytes Relative: 9 %
Neutro Abs: 2.9 10*3/uL (ref 1.7–7.7)
Neutrophils Relative %: 19 %
Platelets: 48 10*3/uL — ABNORMAL LOW (ref 150–400)
RBC: 2.71 MIL/uL — ABNORMAL LOW (ref 4.22–5.81)
RDW: 14.9 % (ref 11.5–15.5)
Smear Review: NORMAL
WBC: 15.2 10*3/uL — ABNORMAL HIGH (ref 4.0–10.5)
nRBC: 0 % (ref 0.0–0.2)

## 2022-01-27 MED ORDER — EPOETIN ALFA-EPBX 40000 UNIT/ML IJ SOLN
40000.0000 [IU] | Freq: Once | INTRAMUSCULAR | Status: AC
  Administered 2022-01-27: 40000 [IU] via SUBCUTANEOUS
  Filled 2022-01-27: qty 1

## 2022-01-28 ENCOUNTER — Encounter: Payer: Self-pay | Admitting: Oncology

## 2022-02-15 ENCOUNTER — Other Ambulatory Visit: Payer: Self-pay

## 2022-02-18 ENCOUNTER — Ambulatory Visit: Payer: Medicare Other

## 2022-02-28 ENCOUNTER — Inpatient Hospital Stay: Payer: Medicare Other

## 2022-03-02 ENCOUNTER — Inpatient Hospital Stay: Payer: Medicare Other

## 2022-03-02 ENCOUNTER — Inpatient Hospital Stay: Payer: Medicare Other | Attending: Oncology

## 2022-03-02 VITALS — BP 147/60 | HR 52

## 2022-03-02 DIAGNOSIS — C9110 Chronic lymphocytic leukemia of B-cell type not having achieved remission: Secondary | ICD-10-CM | POA: Insufficient documentation

## 2022-03-02 DIAGNOSIS — N1832 Chronic kidney disease, stage 3b: Secondary | ICD-10-CM | POA: Insufficient documentation

## 2022-03-02 DIAGNOSIS — D631 Anemia in chronic kidney disease: Secondary | ICD-10-CM | POA: Diagnosis present

## 2022-03-02 LAB — CBC WITH DIFFERENTIAL/PLATELET
Abs Immature Granulocytes: 0.03 10*3/uL (ref 0.00–0.07)
Basophils Absolute: 0 10*3/uL (ref 0.0–0.1)
Basophils Relative: 0 %
Eosinophils Absolute: 0 10*3/uL (ref 0.0–0.5)
Eosinophils Relative: 0 %
HCT: 28.4 % — ABNORMAL LOW (ref 39.0–52.0)
Hemoglobin: 9 g/dL — ABNORMAL LOW (ref 13.0–17.0)
Immature Granulocytes: 0 %
Lymphocytes Relative: 65 %
Lymphs Abs: 11.3 10*3/uL — ABNORMAL HIGH (ref 0.7–4.0)
MCH: 32.7 pg (ref 26.0–34.0)
MCHC: 31.7 g/dL (ref 30.0–36.0)
MCV: 103.3 fL — ABNORMAL HIGH (ref 80.0–100.0)
Monocytes Absolute: 1.5 10*3/uL — ABNORMAL HIGH (ref 0.1–1.0)
Monocytes Relative: 9 %
Neutro Abs: 4.5 10*3/uL (ref 1.7–7.7)
Neutrophils Relative %: 26 %
Platelets: 50 10*3/uL — ABNORMAL LOW (ref 150–400)
RBC: 2.75 MIL/uL — ABNORMAL LOW (ref 4.22–5.81)
RDW: 14.2 % (ref 11.5–15.5)
Smear Review: NORMAL
WBC: 17.3 10*3/uL — ABNORMAL HIGH (ref 4.0–10.5)
nRBC: 0 % (ref 0.0–0.2)

## 2022-03-02 MED ORDER — EPOETIN ALFA-EPBX 40000 UNIT/ML IJ SOLN
40000.0000 [IU] | Freq: Once | INTRAMUSCULAR | Status: AC
  Administered 2022-03-02: 40000 [IU] via SUBCUTANEOUS
  Filled 2022-03-02: qty 1

## 2022-03-31 ENCOUNTER — Inpatient Hospital Stay: Payer: Medicare Other

## 2022-03-31 ENCOUNTER — Inpatient Hospital Stay: Payer: Medicare Other | Attending: Oncology

## 2022-03-31 VITALS — BP 120/62 | HR 58

## 2022-03-31 DIAGNOSIS — N1832 Chronic kidney disease, stage 3b: Secondary | ICD-10-CM | POA: Insufficient documentation

## 2022-03-31 DIAGNOSIS — C9110 Chronic lymphocytic leukemia of B-cell type not having achieved remission: Secondary | ICD-10-CM | POA: Insufficient documentation

## 2022-03-31 DIAGNOSIS — D631 Anemia in chronic kidney disease: Secondary | ICD-10-CM

## 2022-03-31 LAB — CBC WITH DIFFERENTIAL/PLATELET
Abs Immature Granulocytes: 0.02 10*3/uL (ref 0.00–0.07)
Basophils Absolute: 0 10*3/uL (ref 0.0–0.1)
Basophils Relative: 0 %
Eosinophils Absolute: 0.1 10*3/uL (ref 0.0–0.5)
Eosinophils Relative: 1 %
HCT: 31.5 % — ABNORMAL LOW (ref 39.0–52.0)
Hemoglobin: 9.7 g/dL — ABNORMAL LOW (ref 13.0–17.0)
Immature Granulocytes: 0 %
Lymphocytes Relative: 77 %
Lymphs Abs: 12.4 10*3/uL — ABNORMAL HIGH (ref 0.7–4.0)
MCH: 32.4 pg (ref 26.0–34.0)
MCHC: 30.8 g/dL (ref 30.0–36.0)
MCV: 105.4 fL — ABNORMAL HIGH (ref 80.0–100.0)
Monocytes Absolute: 1 10*3/uL (ref 0.1–1.0)
Monocytes Relative: 6 %
Neutro Abs: 2.5 10*3/uL (ref 1.7–7.7)
Neutrophils Relative %: 16 %
Platelets: 43 10*3/uL — ABNORMAL LOW (ref 150–400)
RBC: 2.99 MIL/uL — ABNORMAL LOW (ref 4.22–5.81)
RDW: 14.7 % (ref 11.5–15.5)
Smear Review: NORMAL
WBC: 16 10*3/uL — ABNORMAL HIGH (ref 4.0–10.5)
nRBC: 0 % (ref 0.0–0.2)

## 2022-03-31 MED ORDER — EPOETIN ALFA-EPBX 40000 UNIT/ML IJ SOLN
40000.0000 [IU] | Freq: Once | INTRAMUSCULAR | Status: AC
  Administered 2022-03-31: 40000 [IU] via SUBCUTANEOUS
  Filled 2022-03-31: qty 1

## 2022-04-05 ENCOUNTER — Other Ambulatory Visit: Payer: Medicare Other

## 2022-04-05 ENCOUNTER — Ambulatory Visit: Payer: Medicare Other | Admitting: Oncology

## 2022-04-21 ENCOUNTER — Other Ambulatory Visit (INDEPENDENT_AMBULATORY_CARE_PROVIDER_SITE_OTHER): Payer: Self-pay | Admitting: Vascular Surgery

## 2022-04-21 DIAGNOSIS — I714 Abdominal aortic aneurysm, without rupture, unspecified: Secondary | ICD-10-CM

## 2022-04-22 ENCOUNTER — Other Ambulatory Visit (INDEPENDENT_AMBULATORY_CARE_PROVIDER_SITE_OTHER): Payer: Medicare Other

## 2022-04-22 ENCOUNTER — Ambulatory Visit (INDEPENDENT_AMBULATORY_CARE_PROVIDER_SITE_OTHER): Payer: Medicare Other | Admitting: Vascular Surgery

## 2022-04-22 ENCOUNTER — Encounter (INDEPENDENT_AMBULATORY_CARE_PROVIDER_SITE_OTHER): Payer: Medicare Other

## 2022-04-22 ENCOUNTER — Encounter (INDEPENDENT_AMBULATORY_CARE_PROVIDER_SITE_OTHER): Payer: Self-pay

## 2022-05-02 ENCOUNTER — Inpatient Hospital Stay: Payer: Medicare Other

## 2022-05-04 ENCOUNTER — Inpatient Hospital Stay: Payer: Medicare Other

## 2022-05-04 ENCOUNTER — Inpatient Hospital Stay: Payer: Medicare Other | Attending: Oncology

## 2022-05-04 VITALS — BP 125/55 | HR 57

## 2022-05-04 DIAGNOSIS — D631 Anemia in chronic kidney disease: Secondary | ICD-10-CM | POA: Diagnosis present

## 2022-05-04 DIAGNOSIS — C9110 Chronic lymphocytic leukemia of B-cell type not having achieved remission: Secondary | ICD-10-CM | POA: Insufficient documentation

## 2022-05-04 DIAGNOSIS — N1832 Chronic kidney disease, stage 3b: Secondary | ICD-10-CM | POA: Diagnosis not present

## 2022-05-04 LAB — CBC WITH DIFFERENTIAL/PLATELET
Abs Immature Granulocytes: 0.03 10*3/uL (ref 0.00–0.07)
Basophils Absolute: 0 10*3/uL (ref 0.0–0.1)
Basophils Relative: 0 %
Eosinophils Absolute: 0.1 10*3/uL (ref 0.0–0.5)
Eosinophils Relative: 1 %
HCT: 31.2 % — ABNORMAL LOW (ref 39.0–52.0)
Hemoglobin: 9.7 g/dL — ABNORMAL LOW (ref 13.0–17.0)
Immature Granulocytes: 0 %
Lymphocytes Relative: 72 %
Lymphs Abs: 11.1 10*3/uL — ABNORMAL HIGH (ref 0.7–4.0)
MCH: 32.9 pg (ref 26.0–34.0)
MCHC: 31.1 g/dL (ref 30.0–36.0)
MCV: 105.8 fL — ABNORMAL HIGH (ref 80.0–100.0)
Monocytes Absolute: 0.9 10*3/uL (ref 0.1–1.0)
Monocytes Relative: 6 %
Neutro Abs: 3.3 10*3/uL (ref 1.7–7.7)
Neutrophils Relative %: 21 %
Platelets: 44 10*3/uL — ABNORMAL LOW (ref 150–400)
RBC: 2.95 MIL/uL — ABNORMAL LOW (ref 4.22–5.81)
RDW: 14.6 % (ref 11.5–15.5)
Smear Review: NORMAL
WBC: 15.4 10*3/uL — ABNORMAL HIGH (ref 4.0–10.5)
nRBC: 0 % (ref 0.0–0.2)

## 2022-05-04 MED ORDER — EPOETIN ALFA-EPBX 40000 UNIT/ML IJ SOLN
40000.0000 [IU] | Freq: Once | INTRAMUSCULAR | Status: AC
  Administered 2022-05-04: 40000 [IU] via SUBCUTANEOUS
  Filled 2022-05-04: qty 1

## 2022-05-23 ENCOUNTER — Ambulatory Visit
Admission: RE | Admit: 2022-05-23 | Discharge: 2022-05-23 | Disposition: A | Discharge status: Home / Self Care | Payer: Medicare Other | Source: Ambulatory Visit | Attending: Urology | Admitting: Urology

## 2022-05-23 DIAGNOSIS — Z87448 Personal history of other diseases of urinary system: Secondary | ICD-10-CM | POA: Insufficient documentation

## 2022-05-23 DIAGNOSIS — N281 Cyst of kidney, acquired: Secondary | ICD-10-CM | POA: Insufficient documentation

## 2022-06-08 NOTE — Progress Notes (Signed)
[place]  [phone] [phone]  ID: Shelda Altes Coomes Jr. OB: Mar 01, 1934  MR#: [id number]  KGY#:[id number]  Patient Care Team: Idelle Crouch, MD as PCP - General (Internal Medicine) Lloyd Huger, MD as Consulting Physician (Oncology)  CHIEF COMPLAINT: CLL, anemia secondary to chronic renal insufficiency  INTERVAL HISTORY: Patient returns to clinic today for routine 64-monthevaluation and continuation of Retacrit.  He has chronic weakness and fatigue, but otherwise feels well. He denies any fevers, night sweats, or weight loss. He has noted no new lymphadenopathy. He has no neurologic complaints.  He denies any chest pain, shortness of breath, cough, or hemoptysis.  He denies any nausea, vomiting, constipation, or diarrhea. He has no urinary complaints.  Patient offers no further specific complaints today.  REVIEW OF SYSTEMS:   Review of Systems  Constitutional:  Positive for malaise/fatigue. Negative for diaphoresis, fever and weight loss.  Respiratory: Negative.  Negative for cough and shortness of breath.   Cardiovascular: Negative.  Negative for chest pain and leg swelling.  Gastrointestinal: Negative.  Negative for abdominal pain, blood in stool and melena.  Genitourinary: Negative.  Negative for dysuria.  Musculoskeletal: Negative.  Negative for back pain.  Skin: Negative.  Negative for rash.  Neurological:  Positive for weakness. Negative for dizziness, sensory change, focal weakness and headaches.  Psychiatric/Behavioral: Negative.  The patient is not nervous/anxious.     As per HPI. Otherwise, a complete review of systems is negative.  PAST MEDICAL HISTORY: Past Medical History:  Diagnosis Date   Anxiety    CKD (chronic kidney disease), stage III (HCC)    [place]ocytic [place]    Diabetes mellitus without complication [place]    History of kidney stones    HLD (hyperlipidemia)    HTN (hypertension)     PKD (polycystic kidney disease)    Sleep apnea     PAST SURGICAL HISTORY: Past Surgical History:  Procedure Laterality Date   CARDIAC CATHETERIZATION  12/1987   CENTRAL LINE INSERTION N/A 04/11/2017   Procedure: Central Line Insertion;  Surgeon: SKatha Cabal MD;  [facility];  Service: Cardiovascular;  Laterality: N/A;   CERVICAL FUSION     CHOLECYSTECTOMY     CYSTOSCOPY W/ RETROGRADES Left 05/12/2017   Procedure: CYSTOSCOPY WITH RETROGRADE PYELOGRAM;  Surgeon: BNickie Retort MD;  Location: [facility];  Service: Urology;  Laterality: Left;   CYSTOSCOPY W/ URETERAL STENT PLACEMENT Left 05/12/2017   Procedure: CYSTOSCOPY WITH STENT REMOVAL;  Surgeon: BNickie Retort MD;  Location: [facility];  Service: Urology;  Laterality: Left;   CYSTOSCOPY WITH STENT PLACEMENT Left 04/11/2017   Procedure: CYSTOSCOPY WITH STENT PLACEMENT;  Surgeon: EFestus Aloe MD;  Location: [facility];  Service: Urology;  Laterality: Left;   URETEROSCOPY  05/12/2017   Procedure: URETEROSCOPY;  Surgeon: BNickie Retort MD;  Location: [facility];  Service: Urology;;    FAMILY HISTORY: Reported history of ovarian and lung cancer. Diabetes, hypertension.     ADVANCED DIRECTIVES:    HEALTH MAINTENANCE: Social History   Tobacco Use   Smoking status: Former   Smokeless tobacco: Never   Tobacco comments:    quit in Feb of 1997  Substance Use Topics   Alcohol use: No   Drug use: No     Colonoscopy:  PAP:  Bone density:  Lipid panel:  Allergies  Allergen Reactions   Celecoxib Other (See Comments)    Increased Blood Pressure   Chlorpromazine Nausea  Only   Prednisone Other (See Comments)    Diabetic   Amoxicillin-Pot Clavulanate Rash    Has patient had a PCN reaction causing immediate rash, facial/tongue/throat swelling, SOB or lightheadedness with hypotension: No Has patient had a PCN reaction causing severe rash involving mucus membranes or skin necrosis: No Has patient  had a PCN reaction that required hospitalization: No Has patient had a PCN reaction occurring within the last 10 years: Yes If all of the above answers are "NO", then may proceed with Cephalosporin use.    Penicillin V Potassium Rash    Current Outpatient Medications  Medication Sig Dispense Refill   Aflibercept 2 MG/0.05ML SOLN Inject 1 Dose into the eye as directed. One injection into left eye every 8-9 weeks     allopurinol (ZYLOPRIM) 100 MG tablet Take 100 mg by mouth daily.     atorvastatin (LIPITOR) 10 MG tablet Take 10 mg by mouth at bedtime.      carvedilol (COREG) 12.5 MG tablet Take 12.5 mg by mouth 2 (two) times daily with a meal.     Cholecalciferol (VITAMIN D-3) 5000 units TABS Take 5,000 Units by mouth at bedtime.      clonazePAM (KLONOPIN) 1 MG tablet Take 1 mg by mouth at bedtime.      cyanocobalamin 500 MCG tablet Take 1,000 mcg by mouth at bedtime.      empagliflozin (JARDIANCE) 10 MG TABS tablet      fexofenadine (ALLEGRA) 180 MG tablet Take 180 mg by mouth daily as needed for allergies.      finasteride (PROSCAR) 5 MG tablet Take 1 tablet (5 mg total) by mouth daily. 90 tablet 3   insulin glargine (LANTUS) 100 UNIT/ML injection Inject 50 Units into the skin daily.     LORazepam (ATIVAN) 1 MG tablet Take 1 mg by mouth every 8 (eight) hours as needed for anxiety (takes 1 tablet every morning.  Rarely has to take again during the day).      meclizine (ANTIVERT) 25 MG tablet Take 25 mg by mouth every 8 (eight) hours as needed.     Multiple Vitamins-Minerals (PRESERVISION AREDS 2 PO) Take 1 capsule by mouth 2 (two) times daily.      omega-3 acid ethyl esters (LOVAZA) 1 g capsule Take by mouth 2 (two) times daily.     tamsulosin (FLOMAX) 0.4 MG CAPS capsule Take 1 capsule (0.4 mg total) by mouth daily after supper. 90 capsule 3   telmisartan (MICARDIS) 40 MG tablet Take 40 mg by mouth daily.     triamcinolone (NASACORT) 55 MCG/ACT AERO nasal inhaler Place 2 sprays into the  nose 2 (two) times daily as needed (allergies).      No current facility-administered medications for this visit.   Facility-Administered Medications Ordered in Other Visits  Medication Dose Route Frequency Provider Last Rate Last Admin   epoetin alfa-epbx (RETACRIT) injection 40,000 Units  40,000 Units Subcutaneous Once Lloyd Huger, MD        OBJECTIVE: Vitals:   06/09/22 1341  BP: (!) 145/87  Pulse: (!) 56  Resp: 16  Temp: 98 F (36.7 C)  SpO2: 100%      Body mass index is 34.59 kg/m.    ECOG FS:0 - Asymptomatic  General: Well-developed, well-nourished, no acute distress. Eyes: Pink conjunctiva, anicteric sclera. HEENT: Normocephalic, moist mucous membranes. Lungs: No audible wheezing or coughing. Heart: Regular rate and rhythm. Abdomen: Soft, nontender, no obvious distention. Musculoskeletal: No edema, cyanosis, or clubbing. Neuro: Alert, answering  all questions appropriately. Cranial nerves grossly intact. Skin: No rashes or petechiae noted. Psych: Normal affect.   LAB RESULTS:  Lab Results  Component Value Date   NA 140 11/19/2019   K 3.9 11/19/2019   CL 109 11/19/2019   CO2 21 (L) 11/19/2019   GLUCOSE 159 (H) 11/19/2019   BUN 33 (H) 11/19/2019   CREATININE 1.89 (H) 11/19/2019   CALCIUM 9.6 11/19/2019   PROT 6.1 (L) 04/09/2017   ALBUMIN 2.5 (L) 04/17/2017   AST 22 04/09/2017   ALT 19 04/09/2017   ALKPHOS 76 04/09/2017   BILITOT 0.9 04/09/2017   GFRNONAA 32 (L) 11/19/2019   GFRAA 37 (L) 11/19/2019    Lab Results  Component Value Date   WBC 11.9 (H) 06/09/2022   NEUTROABS 2.6 06/09/2022   HGB 9.7 (L) 06/09/2022   HCT 30.2 (L) 06/09/2022   MCV 104.1 (H) 06/09/2022   PLT 37 (L) 06/09/2022   Lab Results  Component Value Date   IRON 100 08/25/2021   TIBC 333 08/25/2021   IRONPCTSAT 30 08/25/2021   Lab Results  Component Value Date   FERRITIN 31 08/25/2021     STUDIES: Ultrasound renal complete  Result Date: 05/24/2022 CLINICAL  DATA:  History of hydronephrosis. EXAM: RENAL / URINARY TRACT ULTRASOUND COMPLETE COMPARISON:  Renal ultrasound March 30, 2021 FINDINGS: Right Kidney: Renal measurements: 18.1 x 13.7 x 10.9 cm = volume: 1,413 mL. Contains innumerable cysts with the largest measuring 5.8 cm. No follow-up imaging recommended for the cysts. Left Kidney: Renal measurements: 12.8 x 6.3 x 6.4 cm = volume: 270 mL. Contains 2 cysts with the largest measuring 2.1 cm. No follow-up imaging recommended for the cysts. Bladder: Appears normal for degree of bladder distention. Other: None. IMPRESSION: No significant interval change or acute abnormality. No hydronephrosis. Electronically Signed   By: Dorise Bullion III M.D.   On: 05/24/2022 10:03    ASSESSMENT: Rai stage I CLL, anemia secondary to chronic renal insufficiency.  PLAN:    1. CLL: Patient's white blood cell count remains mildly elevated at 11.9.  This has remained essentially unchanged for greater than 10 years.  No intervention is needed.  Continue simple observation.   2. Thrombocytopenia: Chronic and unchanged for greater than 10 years.  Patient's platelet count is 37 today which is back to his baseline.  He does not require bone marrow biopsy. 3.  Chronic renal insufficiency: Chronic and unchanged.  Continue monitoring and treatment per nephrology.    4. Lymphadenopathy: CT scan results from April 09, 2017 reviewed independently with enlarged pelvic and retroperitoneal lymph nodes. These are also noted on a scan in 2012 appear to be slightly larger. No intervention is needed. These are likely secondary to patient's underlying CLL.  No further imaging is necessary unless there is suspicion of progression of disease.   5.  Anemia secondary to chronic renal insufficiency: Patient's hemoglobin remains decreased, but mildly improved to 9.7. Previously all of his other laboratory work including iron panel are either negative or within normal limits.  Proceed with 40,000 units  Retacrit today.  Return to clinic in 1, 2, and 3 months for laboratory work and consideration of Retacrit if his hemoglobin remains below 10.0.  Patient will then return to clinic in 4 months for repeat laboratory work, further evaluation, and continuation of treatment if needed.    Patient expressed understanding and was in agreement with this plan. He also understands that He can call clinic at any time with any questions,  concerns, or complaints.   Lloyd Huger, MD   06/12/2022 7:23 AM

## 2022-06-09 ENCOUNTER — Encounter: Payer: Self-pay | Admitting: Oncology

## 2022-06-09 ENCOUNTER — Inpatient Hospital Stay: Payer: Medicare Other

## 2022-06-09 ENCOUNTER — Inpatient Hospital Stay: Payer: Medicare Other | Attending: Oncology

## 2022-06-09 ENCOUNTER — Inpatient Hospital Stay (HOSPITAL_BASED_OUTPATIENT_CLINIC_OR_DEPARTMENT_OTHER): Payer: Medicare Other | Admitting: Oncology

## 2022-06-09 VITALS — BP 145/87 | HR 56 | Temp 98.0°F | Resp 16 | Wt 214.3 lb

## 2022-06-09 DIAGNOSIS — C9110 Chronic lymphocytic leukemia of B-cell type not having achieved remission: Secondary | ICD-10-CM | POA: Insufficient documentation

## 2022-06-09 DIAGNOSIS — D631 Anemia in chronic kidney disease: Secondary | ICD-10-CM

## 2022-06-09 DIAGNOSIS — Z87891 Personal history of nicotine dependence: Secondary | ICD-10-CM | POA: Diagnosis not present

## 2022-06-09 DIAGNOSIS — N1832 Chronic kidney disease, stage 3b: Secondary | ICD-10-CM | POA: Diagnosis present

## 2022-06-09 DIAGNOSIS — N189 Chronic kidney disease, unspecified: Secondary | ICD-10-CM | POA: Diagnosis not present

## 2022-06-09 LAB — CBC WITH DIFFERENTIAL/PLATELET
Abs Immature Granulocytes: 0.01 10*3/uL (ref 0.00–0.07)
Basophils Absolute: 0 10*3/uL (ref 0.0–0.1)
Basophils Relative: 0 %
Eosinophils Absolute: 0.1 10*3/uL (ref 0.0–0.5)
Eosinophils Relative: 1 %
HCT: 30.2 % — ABNORMAL LOW (ref 39.0–52.0)
Hemoglobin: 9.7 g/dL — ABNORMAL LOW (ref 13.0–17.0)
Immature Granulocytes: 0 %
Lymphocytes Relative: 74 %
Lymphs Abs: 8.8 10*3/uL — ABNORMAL HIGH (ref 0.7–4.0)
MCH: 33.4 pg (ref 26.0–34.0)
MCHC: 32.1 g/dL (ref 30.0–36.0)
MCV: 104.1 fL — ABNORMAL HIGH (ref 80.0–100.0)
Monocytes Absolute: 0.4 10*3/uL (ref 0.1–1.0)
Monocytes Relative: 3 %
Neutro Abs: 2.6 10*3/uL (ref 1.7–7.7)
Neutrophils Relative %: 22 %
Platelets: 37 10*3/uL — ABNORMAL LOW (ref 150–400)
RBC: 2.9 MIL/uL — ABNORMAL LOW (ref 4.22–5.81)
RDW: 15.1 % (ref 11.5–15.5)
Smear Review: ADEQUATE
WBC: 11.9 10*3/uL — ABNORMAL HIGH (ref 4.0–10.5)
nRBC: 0 % (ref 0.0–0.2)

## 2022-06-09 MED ORDER — EPOETIN ALFA-EPBX 40000 UNIT/ML IJ SOLN
40000.0000 [IU] | Freq: Once | INTRAMUSCULAR | Status: AC
  Administered 2022-06-09: 40000 [IU] via SUBCUTANEOUS
  Filled 2022-06-09: qty 1

## 2022-06-09 NOTE — Progress Notes (Signed)
Pt in for follow up, reports having some dizziness and currently on meclizine.  States has appointment scheduled with neurologist in October.

## 2022-06-12 ENCOUNTER — Encounter: Payer: Self-pay | Admitting: Oncology

## 2022-06-20 ENCOUNTER — Other Ambulatory Visit (INDEPENDENT_AMBULATORY_CARE_PROVIDER_SITE_OTHER): Payer: Self-pay | Admitting: Vascular Surgery

## 2022-06-20 DIAGNOSIS — I6523 Occlusion and stenosis of bilateral carotid arteries: Secondary | ICD-10-CM

## 2022-06-21 ENCOUNTER — Ambulatory Visit (INDEPENDENT_AMBULATORY_CARE_PROVIDER_SITE_OTHER): Payer: Medicare Other

## 2022-06-21 ENCOUNTER — Encounter (INDEPENDENT_AMBULATORY_CARE_PROVIDER_SITE_OTHER): Payer: Self-pay | Admitting: Vascular Surgery

## 2022-06-21 ENCOUNTER — Ambulatory Visit (INDEPENDENT_AMBULATORY_CARE_PROVIDER_SITE_OTHER): Payer: Medicare Other | Admitting: Vascular Surgery

## 2022-06-21 VITALS — BP 153/68 | HR 62 | Resp 16 | Wt 213.8 lb

## 2022-06-21 DIAGNOSIS — I714 Abdominal aortic aneurysm, without rupture, unspecified: Secondary | ICD-10-CM

## 2022-06-21 DIAGNOSIS — E119 Type 2 diabetes mellitus without complications: Secondary | ICD-10-CM

## 2022-06-21 DIAGNOSIS — I6523 Occlusion and stenosis of bilateral carotid arteries: Secondary | ICD-10-CM | POA: Diagnosis not present

## 2022-06-21 DIAGNOSIS — I10 Essential (primary) hypertension: Secondary | ICD-10-CM | POA: Diagnosis not present

## 2022-06-21 DIAGNOSIS — N183 Chronic kidney disease, stage 3 unspecified: Secondary | ICD-10-CM

## 2022-06-21 DIAGNOSIS — I7143 Infrarenal abdominal aortic aneurysm, without rupture: Secondary | ICD-10-CM | POA: Diagnosis not present

## 2022-06-21 DIAGNOSIS — E785 Hyperlipidemia, unspecified: Secondary | ICD-10-CM

## 2022-06-21 NOTE — Assessment & Plan Note (Signed)
lipid control important in reducing the progression of atherosclerotic disease. Continue statin therapy  

## 2022-06-21 NOTE — Assessment & Plan Note (Addendum)
Carotid duplex today reveals stable 1 to 39% ICA stenosis bilaterally without significant progression from previous studies.  No increased risk of stroke.  Recheck in 1 year.

## 2022-06-21 NOTE — Progress Notes (Signed)
MRN : [id number]  Patrick Payne. is a 88 y.o. (02-07-1934) male who presents with chief complaint of  Chief Complaint  Patient presents with   Follow-up    Ultrasound follow up  .  History of Present Illness: Patient returns today in follow up of multiple vascular issues.  He is doing well today.  He did delay his current visit several months as he was going through some vertigo back in July and August.  This seems to be improved.  No focal neurologic symptoms. Specifically, the patient denies amaurosis fugax, speech or swallowing difficulties, or arm or leg weakness or numbness. Carotid duplex today reveals stable 1 to 39% ICA stenosis bilaterally without significant progression from previous studies He is also followed for his abdominal aortic aneurysm.  He denies any aneurysm related symptoms. Specifically, the patient denies new back or abdominal pain, or signs of peripheral embolization.  His duplex today does show enlargement of his abdominal aortic aneurysm now measuring 4.56 cm in maximal diameter.  Previously, this measured about 4.1 cm 8 months ago.  Current Outpatient Medications  Medication Sig Dispense Refill   Aflibercept 2 MG/0.05ML SOLN Inject 1 Dose into the eye as directed. One injection into left eye every 8-9 weeks     allopurinol (ZYLOPRIM) 100 MG tablet Take 100 mg by mouth daily.     atorvastatin (LIPITOR) 10 MG tablet Take 10 mg by mouth at bedtime.      carvedilol (COREG) 12.5 MG tablet Take 12.5 mg by mouth 2 (two) times daily with a meal.     Cholecalciferol (VITAMIN D-3) 5000 units TABS Take 5,000 Units by mouth at bedtime.      clonazePAM (KLONOPIN) 1 MG tablet Take 1 mg by mouth at bedtime.      cyanocobalamin 500 MCG tablet Take 1,000 mcg by mouth at bedtime.      empagliflozin (JARDIANCE) 10 MG TABS tablet      fexofenadine (ALLEGRA) 180 MG tablet Take 180 mg by mouth daily as needed for allergies.      finasteride (PROSCAR) 5 MG tablet Take 1 tablet  (5 mg total) by mouth daily. 90 tablet 3   insulin glargine (LANTUS) 100 UNIT/ML injection Inject 50 Units into the skin daily.     LORazepam (ATIVAN) 1 MG tablet Take 1 mg by mouth every 8 (eight) hours as needed for anxiety (takes 1 tablet every morning.  Rarely has to take again during the day).      meclizine (ANTIVERT) 25 MG tablet Take 25 mg by mouth every 8 (eight) hours as needed.     Multiple Vitamins-Minerals (PRESERVISION AREDS 2 PO) Take 1 capsule by mouth 2 (two) times daily.      omega-3 acid ethyl esters (LOVAZA) 1 g capsule Take by mouth 2 (two) times daily.     tamsulosin (FLOMAX) 0.4 MG CAPS capsule Take 1 capsule (0.4 mg total) by mouth daily after supper. 90 capsule 3   telmisartan (MICARDIS) 40 MG tablet Take 40 mg by mouth daily.     triamcinolone (NASACORT) 55 MCG/ACT AERO nasal inhaler Place 2 sprays into the nose 2 (two) times daily as needed (allergies).      No current facility-administered medications for this visit.   Facility-Administered Medications Ordered in Other Visits  Medication Dose Route Frequency Provider Last Rate Last Admin   epoetin alfa-epbx (RETACRIT) injection 40,000 Units  40,000 Units Subcutaneous Once Lloyd Huger, MD  Past Medical History:  Diagnosis Date   Anxiety    CKD (chronic kidney disease), stage III (HCC)    CLL (chronic lymphocytic leukemia) (HCC)    Diabetes mellitus without complication ([place])    History of kidney stones    HLD (hyperlipidemia)    HTN (hypertension)    PKD (polycystic kidney disease)    Sleep apnea     Past Surgical History:  Procedure Laterality Date   CARDIAC CATHETERIZATION  12/1987   CENTRAL LINE INSERTION N/A 04/11/2017   Procedure: Central Line Insertion;  Surgeon: Katha Cabal, MD;  Location: [facility];  Service: Cardiovascular;  Laterality: N/A;   CERVICAL FUSION     CHOLECYSTECTOMY     CYSTOSCOPY W/ RETROGRADES Left 05/12/2017   Procedure: CYSTOSCOPY WITH RETROGRADE  PYELOGRAM;  Surgeon: Nickie Retort, MD;  Location: [facility];  Service: Urology;  Laterality: Left;   CYSTOSCOPY W/ URETERAL STENT PLACEMENT Left 05/12/2017   Procedure: CYSTOSCOPY WITH STENT REMOVAL;  Surgeon: Nickie Retort, MD;  Location: [facility];  Service: Urology;  Laterality: Left;   CYSTOSCOPY WITH STENT PLACEMENT Left 04/11/2017   Procedure: CYSTOSCOPY WITH STENT PLACEMENT;  Surgeon: Festus Aloe, MD;  Location: [facility];  Service: Urology;  Laterality: Left;   URETEROSCOPY  05/12/2017   Procedure: URETEROSCOPY;  Surgeon: Nickie Retort, MD;  Location: [facility];  Service: Urology;;     Social History   Tobacco Use   Smoking status: Former   Smokeless tobacco: Never   Tobacco comments:    quit in Feb of 1997  Substance Use Topics   Alcohol use: No   Drug use: No      Family History  Problem Relation Age of Onset   Cancer Mother    Varicose Veins Mother    Cancer Father   No bleeding or clotting disorders  Allergies  Allergen Reactions   Celecoxib Other (See Comments)    Increased Blood Pressure   Chlorpromazine Nausea Only   Prednisone Other (See Comments)    Diabetic   Amoxicillin-Pot Clavulanate Rash    Has patient had a PCN reaction causing immediate rash, facial/tongue/throat swelling, SOB or lightheadedness with hypotension: No Has patient had a PCN reaction causing severe rash involving mucus membranes or skin necrosis: No Has patient had a PCN reaction that required hospitalization: No Has patient had a PCN reaction occurring within the last 10 years: Yes If all of the above answers are "NO", then may proceed with Cephalosporin use.    Penicillin V Potassium Rash   REVIEW OF SYSTEMS (Negative unless checked)   Constitutional: '[]'$ Weight loss  '[]'$ Fever  '[]'$ Chills Cardiac: '[]'$ Chest pain   '[]'$ Chest pressure   '[]'$ Palpitations   '[]'$ Shortness of breath when laying flat   '[]'$ Shortness of breath at rest   '[]'$ Shortness of breath with exertion. Vascular:   '[]'$ Pain in legs with walking   '[]'$ Pain in legs at rest   '[]'$ Pain in legs when laying flat   '[]'$ Claudication   '[]'$ Pain in feet when walking  '[]'$ Pain in feet at rest  '[]'$ Pain in feet when laying flat   '[]'$ History of DVT   '[]'$ Phlebitis   '[]'$ Swelling in legs   '[]'$ Varicose veins   '[]'$ Non-healing ulcers Pulmonary:   '[]'$ Uses home oxygen   '[]'$ Productive cough   '[]'$ Hemoptysis   '[]'$ Wheeze  '[]'$ COPD   '[]'$ Asthma Neurologic:  '[]'$ Dizziness  '[]'$ Blackouts   '[]'$ Seizures   '[]'$ History of stroke   '[]'$ History of TIA  '[]'$ Aphasia   '[]'$ Temporary blindness   '[]'$ Dysphagia   '[]'$   Weakness or numbness in arms   '[]'$ Weakness or numbness in legs Musculoskeletal:  '[x]'$ Arthritis   '[]'$ Joint swelling   '[x]'$ Joint pain   '[]'$ Low back pain Hematologic:  '[]'$ Easy bruising  '[]'$ Easy bleeding   '[]'$ Hypercoagulable state   '[]'$ Anemic   Gastrointestinal:  '[]'$ Blood in stool   '[]'$ Vomiting blood  '[]'$ Gastroesophageal reflux/heartburn   '[]'$ Abdominal pain Genitourinary:  '[x]'$ Chronic kidney disease   '[]'$ Difficult urination  '[]'$ Frequent urination  '[]'$ Burning with urination   '[]'$ Hematuria Skin:  '[]'$ Rashes   '[]'$ Ulcers   '[]'$ Wounds Psychological:  '[x]'$ History of anxiety   '[]'$  History of major depression.   Physical Examination  BP (!) 153/68 (BP Location: Left Arm)   Pulse 62   Resp 16   Wt 213 lb 12.8 oz (97 kg)   BMI 34.51 kg/m  Gen:  WD/WN, NAD Head: [place]/AT, No temporalis wasting. Ear/Nose/Throat: Hearing grossly intact, nares w/o erythema or drainage Eyes: Conjunctiva clear. Sclera non-icteric Neck: Supple.  Trachea midline Pulmonary:  Good air movement, no use of accessory muscles.  Cardiac: RRR, no JVD Vascular:  Vessel Right Left  Radial Palpable Palpable                          PT Palpable Palpable  DP Palpable Palpable   Gastrointestinal: soft, non-tender/non-distended. No guarding/reflex.  Aorta not easily palpable with body habitus Musculoskeletal: M/S 5/5 throughout.  No deformity or atrophy. No edema. Neurologic: Sensation grossly intact in extremities.  Symmetrical.  Speech  is fluent.  Psychiatric: Judgment intact, Mood & affect appropriate for pt's clinical situation. Dermatologic: No rashes or ulcers noted.  No cellulitis or open wounds.      Labs Recent Results (from the past 2160 hour(s))  CBC with Differential     Status: Abnormal   Collection Time: 03/31/22  1:04 PM  Result Value Ref Range   WBC 16.0 (H) 4.0 - 10.5 K/uL   RBC 2.99 (L) 4.22 - 5.81 MIL/uL   Hemoglobin 9.7 (L) 13.0 - 17.0 g/dL   HCT 31.5 (L) 39.0 - 52.0 %   MCV 105.4 (H) 80.0 - 100.0 fL   MCH 32.4 26.0 - 34.0 pg   MCHC 30.8 30.0 - 36.0 g/dL   RDW 14.7 11.5 - 15.5 %   Platelets 43 (L) 150 - 400 K/uL    Comment: SPECIMEN CHECKED FOR CLOTS Immature Platelet Fraction may be clinically indicated, consider ordering this additional test [id number]    nRBC 0.0 0.0 - 0.2 %   Neutrophils Relative % 16 %   Neutro Abs 2.5 1.7 - 7.7 K/uL   Lymphocytes Relative 77 %   Lymphs Abs 12.4 (H) 0.7 - 4.0 K/uL   Monocytes Relative 6 %   Monocytes Absolute 1.0 0.1 - 1.0 K/uL   Eosinophils Relative 1 %   Eosinophils Absolute 0.1 0.0 - 0.5 K/uL   Basophils Relative 0 %   Basophils Absolute 0.0 0.0 - 0.1 K/uL   WBC Morphology SMUDGE CELLS     Comment: DIFF. CONFIRMED BY SMEAR   RBC Morphology MORPHOLOGY UNREMARKABLE    Smear Review Normal platelet morphology     Comment: PLATELETS APPEAR DECREASED PLATELET COUNT CONFIRMED BY SMEAR    Immature Granulocytes 0 %   Abs Immature Granulocytes 0.02 0.00 - 0.07 K/uL    Comment: Performed at [facility], [address] [place], [place] [address]  CBC with Differential     Status: Abnormal   Collection Time: 05/04/22 12:42 PM  Result Value Ref Range  WBC 15.4 (H) 4.0 - 10.5 K/uL   RBC 2.95 (L) 4.22 - 5.81 MIL/uL   Hemoglobin 9.7 (L) 13.0 - 17.0 g/dL   HCT 31.2 (L) 39.0 - 52.0 %   MCV 105.8 (H) 80.0 - 100.0 fL   MCH 32.9 26.0 - 34.0 pg   MCHC 31.1 30.0 - 36.0 g/dL   RDW 14.6 11.5 - 15.5 %   Platelets 44 (L) 150 - 400 K/uL    Comment:  Immature Platelet Fraction may be clinically indicated, consider ordering this additional test [id number]    nRBC 0.0 0.0 - 0.2 %   Neutrophils Relative % 21 %   Neutro Abs 3.3 1.7 - 7.7 K/uL   Lymphocytes Relative 72 %   Lymphs Abs 11.1 (H) 0.7 - 4.0 K/uL   Monocytes Relative 6 %   Monocytes Absolute 0.9 0.1 - 1.0 K/uL   Eosinophils Relative 1 %   Eosinophils Absolute 0.1 0.0 - 0.5 K/uL   Basophils Relative 0 %   Basophils Absolute 0.0 0.0 - 0.1 K/uL   WBC Morphology SMUDGE CELLS     Comment: DIFF CONFIRMED BY MANUAL.   RBC Morphology UNREMARKABLE    Smear Review Normal platelet morphology     Comment: PLATELETS APPEAR DECREASED   Immature Granulocytes 0 %   Abs Immature Granulocytes 0.03 0.00 - 0.07 K/uL    Comment: Performed at [facility], [address] [place], [place] [address]  CBC with Differential     Status: Abnormal   Collection Time: 06/09/22  1:26 PM  Result Value Ref Range   WBC 11.9 (H) 4.0 - 10.5 K/uL   RBC 2.90 (L) 4.22 - 5.81 MIL/uL   Hemoglobin 9.7 (L) 13.0 - 17.0 g/dL   HCT 30.2 (L) 39.0 - 52.0 %   MCV 104.1 (H) 80.0 - 100.0 fL   MCH 33.4 26.0 - 34.0 pg   MCHC 32.1 30.0 - 36.0 g/dL   RDW 15.1 11.5 - 15.5 %   Platelets 37 (L) 150 - 400 K/uL    Comment: Immature Platelet Fraction may be clinically indicated, consider ordering this additional test [id number]    nRBC 0.0 0.0 - 0.2 %   Neutrophils Relative % 22 %   Neutro Abs 2.6 1.7 - 7.7 K/uL   Lymphocytes Relative 74 %   Lymphs Abs 8.8 (H) 0.7 - 4.0 K/uL   Monocytes Relative 3 %   Monocytes Absolute 0.4 0.1 - 1.0 K/uL   Eosinophils Relative 1 %   Eosinophils Absolute 0.1 0.0 - 0.5 K/uL   Basophils Relative 0 %   Basophils Absolute 0.0 0.0 - 0.1 K/uL   WBC Morphology SMUDGE CELLS     Comment: DIFF CONFIRMED BY MANUAL.  CONSISTANT WITH KNOWN CLL.   RBC Morphology UNREMARKABLE    Smear Review PLATELETS APPEAR ADEQUATE     Comment: PLATELETS APPEAR DECREASED   Immature Granulocytes 0 %    Abs Immature Granulocytes 0.01 0.00 - 0.07 K/uL    Comment: Performed at [facility], [address], [place], [place] [address]    Radiology Ultrasound renal complete  Result Date: 05/24/2022 CLINICAL DATA:  History of hydronephrosis. EXAM: RENAL / URINARY TRACT ULTRASOUND COMPLETE COMPARISON:  Renal ultrasound March 30, 2021 FINDINGS: Right Kidney: Renal measurements: 18.1 x 13.7 x 10.9 cm = volume: 1,413 mL. Contains innumerable cysts with the largest measuring 5.8 cm. No follow-up imaging recommended for the cysts. Left Kidney: Renal measurements: 12.8 x 6.3 x 6.4 cm = volume:  270 mL. Contains 2 cysts with the largest measuring 2.1 cm. No follow-up imaging recommended for the cysts. Bladder: Appears normal for degree of bladder distention. Other: None. IMPRESSION: No significant interval change or acute abnormality. No hydronephrosis. Electronically Signed   By: Dorise Bullion III M.D.   On: 05/24/2022 10:03    Assessment/Plan Diabetes mellitus ([place]) blood glucose control important in reducing the progression of atherosclerotic disease. Also, involved in wound healing. On appropriate medications.     BP (high blood pressure) blood pressure control important in reducing the progression of atherosclerotic disease and aneurysmal disease. On appropriate oral medications.   Chronic kidney disease (CKD), stage III (moderate) Avoid contrast with CT scan unless size appears to be in the range to need repair.  HLD (hyperlipidemia) lipid control important in reducing the progression of atherosclerotic disease. Continue statin therapy   Carotid stenosis Carotid duplex today reveals stable 1 to 39% ICA stenosis bilaterally without significant progression from previous studies.  No increased risk of stroke.  Recheck in 1 year.  Abdominal aortic aneurysm (AAA) ([place]) His duplex today does show enlargement of his abdominal aortic aneurysm now measuring 4.56 cm in maximal diameter.   Previously, this measured about 4.1 cm 8 months ago.  Although this is still below the threshold for repair, this is not insignificant growth since his last study.  He does have significant renal insufficiency and only 1 functional kidney, so I will hold on a CT scan for now but if he has persistent growth at his next visit we would likely then get a CT angiogram. RTC in 6 months.    Leotis Pain, MD  06/21/2022 9:48 AM    This note was created with Dragon medical transcription system.  Any errors from dictation are purely unintentional

## 2022-06-21 NOTE — Assessment & Plan Note (Signed)
His duplex today does show enlargement of his abdominal aortic aneurysm now measuring 4.56 cm in maximal diameter.  Previously, this measured about 4.1 cm 8 months ago.  Although this is still below the threshold for repair, this is not insignificant growth since his last study.  He does have significant renal insufficiency and only 1 functional kidney, so I will hold on a CT scan for now but if he has persistent growth at his next visit we would likely then get a CT angiogram. RTC in 6 months.

## 2022-06-27 ENCOUNTER — Other Ambulatory Visit: Payer: Self-pay

## 2022-07-01 ENCOUNTER — Other Ambulatory Visit: Payer: Self-pay

## 2022-07-01 MED ORDER — FLUAD QUADRIVALENT 0.5 ML IM PRSY
PREFILLED_SYRINGE | INTRAMUSCULAR | 0 refills | Status: DC
  Filled 2022-07-01: qty 0.5, 1d supply, fill #0

## 2022-07-05 ENCOUNTER — Other Ambulatory Visit: Payer: Self-pay

## 2022-07-05 MED ORDER — AREXVY 120 MCG/0.5ML IM SUSR
INTRAMUSCULAR | 0 refills | Status: DC
Start: 2022-07-05 — End: 2022-07-14
  Filled 2022-07-05: qty 0.5, 1d supply, fill #0

## 2022-07-11 ENCOUNTER — Inpatient Hospital Stay: Payer: Medicare Other

## 2022-07-11 ENCOUNTER — Inpatient Hospital Stay: Payer: Medicare Other | Attending: Oncology

## 2022-07-11 VITALS — BP 163/81 | HR 72

## 2022-07-11 DIAGNOSIS — D631 Anemia in chronic kidney disease: Secondary | ICD-10-CM | POA: Diagnosis present

## 2022-07-11 DIAGNOSIS — N1832 Chronic kidney disease, stage 3b: Secondary | ICD-10-CM | POA: Diagnosis present

## 2022-07-11 DIAGNOSIS — C9110 Chronic lymphocytic leukemia of B-cell type not having achieved remission: Secondary | ICD-10-CM | POA: Insufficient documentation

## 2022-07-11 LAB — CBC WITH DIFFERENTIAL/PLATELET
Abs Immature Granulocytes: 0.01 10*3/uL (ref 0.00–0.07)
Basophils Absolute: 0 10*3/uL (ref 0.0–0.1)
Basophils Relative: 0 %
Eosinophils Absolute: 0.2 10*3/uL (ref 0.0–0.5)
Eosinophils Relative: 1 %
HCT: 30.6 % — ABNORMAL LOW (ref 39.0–52.0)
Hemoglobin: 9.7 g/dL — ABNORMAL LOW (ref 13.0–17.0)
Immature Granulocytes: 0 %
Lymphocytes Relative: 68 %
Lymphs Abs: 9.4 10*3/uL — ABNORMAL HIGH (ref 0.7–4.0)
MCH: 32.2 pg (ref 26.0–34.0)
MCHC: 31.7 g/dL (ref 30.0–36.0)
MCV: 101.7 fL — ABNORMAL HIGH (ref 80.0–100.0)
Monocytes Absolute: 1.3 10*3/uL — ABNORMAL HIGH (ref 0.1–1.0)
Monocytes Relative: 9 %
Neutro Abs: 3.1 10*3/uL (ref 1.7–7.7)
Neutrophils Relative %: 22 %
Platelets: 43 10*3/uL — ABNORMAL LOW (ref 150–400)
RBC: 3.01 MIL/uL — ABNORMAL LOW (ref 4.22–5.81)
RDW: 14.7 % (ref 11.5–15.5)
Smear Review: NORMAL
WBC: 14 10*3/uL — ABNORMAL HIGH (ref 4.0–10.5)
nRBC: 0 % (ref 0.0–0.2)

## 2022-07-11 MED ORDER — EPOETIN ALFA-EPBX 40000 UNIT/ML IJ SOLN
40000.0000 [IU] | Freq: Once | INTRAMUSCULAR | Status: AC
  Administered 2022-07-11: 40000 [IU] via SUBCUTANEOUS
  Filled 2022-07-11: qty 1

## 2022-07-14 ENCOUNTER — Ambulatory Visit (INDEPENDENT_AMBULATORY_CARE_PROVIDER_SITE_OTHER): Payer: Medicare Other | Admitting: Urology

## 2022-07-14 ENCOUNTER — Encounter: Payer: Self-pay | Admitting: Urology

## 2022-07-14 ENCOUNTER — Other Ambulatory Visit: Payer: Self-pay

## 2022-07-14 VITALS — BP 124/73 | HR 81 | Ht 66.0 in | Wt 213.0 lb

## 2022-07-14 DIAGNOSIS — N401 Enlarged prostate with lower urinary tract symptoms: Secondary | ICD-10-CM | POA: Diagnosis not present

## 2022-07-14 DIAGNOSIS — R3121 Asymptomatic microscopic hematuria: Secondary | ICD-10-CM | POA: Diagnosis not present

## 2022-07-14 LAB — BLADDER SCAN AMB NON-IMAGING

## 2022-07-14 NOTE — Progress Notes (Signed)
   07/14/2022 1:17 PM   Patrick Altes Bisson Jr. [birth year] [id number]  Reason for visit: History of microscopic hematuria, solitary kidney   HPI: 88 year old male with interesting urologic history.He had acute onset of renal failure in July 2018 and was found to have left hydronephrosis and a functionally solitary left kidney, with severe polycystic disease of the right kidney.  He underwent ureteral stent placement which improved his renal function, and ultimately underwent negative ureteroscopy, stent was removed, and hydronephrosis resolved.  He had a cystoscopy with an outside urologist a few days prior to developing the original hydronephrosis, and he feels he developed an infection from this causing the hydronephrosis originally.  A follow-up ultrasound 06/09/2017 showed resolution of his hydronephrosis after stent removal.   He has a history of intermittent right-sided flank pain and gross hematuria suspected to be from rupture of right renal cyst in the setting of his polycystic right kidney.  He has deferred further cystoscopy in the past with his history of complications from cystoscopy with Dr. Eliberto Ivory.   Repeat renal ultrasounds have been reassuring.  I personally viewed and interpreted the most recent renal ultrasound from August 2023 showing no hydronephrosis, polycystic right kidney with minimal remaining parenchyma, normal left kidney.   He has done well over the last 6 months from a urology perspective.  Urinalysis in June 2023 with PCP was completely benign.  He denies any urinary complaints, no gross hematuria, no flank pain, no debris or stones in the urine.  PVR normal at 0 ml. He voids about 4 times per day and denies any urinary symptoms.  He has been on maximal medical therapy with Flomax and finasteride long-term.  He is interested in stopping either of these medications if possible.  I think it is reasonable to stop the Flomax and see if his urinary symptoms worsen, and OK to  resume if weakened urinary stream or any voiding problems.  Could consider discontinuing finasteride at our visit next year if no changes in the urination.  Prostate measured 46 g on CT from July 2018.  PSA recently checked with PCP in June 2023 and was normal at 0.3, corrected for finasteride 0.6.  I recommended no further PSA screening based on guideline recommendations with his age and comorbidities including CKD.  RTC 1 year PVR Okay to resume Flomax if worsening urinary symptoms Consider discontinuing finasteride at follow-up  [name], [place] [address] [place], [place] [address] [phone]

## 2022-08-02 ENCOUNTER — Telehealth: Payer: Self-pay | Admitting: Family Medicine

## 2022-08-02 DIAGNOSIS — N401 Enlarged prostate with lower urinary tract symptoms: Secondary | ICD-10-CM

## 2022-08-02 MED ORDER — TAMSULOSIN HCL 0.4 MG PO CAPS: 0.4000 mg | ORAL_CAPSULE | Freq: Every day | ORAL | 3 refills | Status: DC

## 2022-08-02 NOTE — Telephone Encounter (Signed)
Patient called and states at his last visit he was suppose to try to stop Tamsulosin to see how he does. Patient states he needs the medication. He is requeting a refill of the medication sent to [facility] at [address]. He is requesting a 90 day supply.

## 2022-08-02 NOTE — Telephone Encounter (Signed)
Spoke with patient and advised results, pt states his urine output had slowed down, per last OV ok to restart rx sent to pharmacy by e-script

## 2022-08-08 ENCOUNTER — Encounter (INDEPENDENT_AMBULATORY_CARE_PROVIDER_SITE_OTHER): Payer: Self-pay

## 2022-08-11 ENCOUNTER — Other Ambulatory Visit: Payer: Self-pay

## 2022-08-11 ENCOUNTER — Inpatient Hospital Stay: Payer: Medicare Other | Attending: Oncology

## 2022-08-11 ENCOUNTER — Inpatient Hospital Stay: Payer: Medicare Other

## 2022-08-11 VITALS — BP 146/79 | HR 61 | Resp 20

## 2022-08-11 DIAGNOSIS — D631 Anemia in chronic kidney disease: Secondary | ICD-10-CM

## 2022-08-11 DIAGNOSIS — N1832 Chronic kidney disease, stage 3b: Secondary | ICD-10-CM | POA: Diagnosis not present

## 2022-08-11 DIAGNOSIS — C9110 Chronic lymphocytic leukemia of B-cell type not having achieved remission: Secondary | ICD-10-CM | POA: Insufficient documentation

## 2022-08-11 LAB — CBC WITH DIFFERENTIAL/PLATELET
Abs Immature Granulocytes: 0.01 10*3/uL (ref 0.00–0.07)
Basophils Absolute: 0 10*3/uL (ref 0.0–0.1)
Basophils Relative: 0 %
Eosinophils Absolute: 0.2 10*3/uL (ref 0.0–0.5)
Eosinophils Relative: 1 %
HCT: 29.6 % — ABNORMAL LOW (ref 39.0–52.0)
Hemoglobin: 9.4 g/dL — ABNORMAL LOW (ref 13.0–17.0)
Immature Granulocytes: 0 %
Lymphocytes Relative: 70 %
Lymphs Abs: 8.6 10*3/uL — ABNORMAL HIGH (ref 0.7–4.0)
MCH: 33.1 pg (ref 26.0–34.0)
MCHC: 31.8 g/dL (ref 30.0–36.0)
MCV: 104.2 fL — ABNORMAL HIGH (ref 80.0–100.0)
Monocytes Absolute: 1 10*3/uL (ref 0.1–1.0)
Monocytes Relative: 8 %
Neutro Abs: 2.7 10*3/uL (ref 1.7–7.7)
Neutrophils Relative %: 21 %
Platelets: 50 10*3/uL — ABNORMAL LOW (ref 150–400)
RBC: 2.84 MIL/uL — ABNORMAL LOW (ref 4.22–5.81)
RDW: 14.5 % (ref 11.5–15.5)
Smear Review: NORMAL
WBC: 12.5 10*3/uL — ABNORMAL HIGH (ref 4.0–10.5)
nRBC: 0 % (ref 0.0–0.2)

## 2022-08-11 MED ORDER — COVID-19 MRNA VAC-TRIS(PFIZER) 30 MCG/0.3ML IM SUSY
PREFILLED_SYRINGE | INTRAMUSCULAR | 0 refills | Status: DC
  Filled 2022-08-12: qty 0.3, 1d supply, fill #0

## 2022-08-11 MED ORDER — EPOETIN ALFA-EPBX 40000 UNIT/ML IJ SOLN
40000.0000 [IU] | Freq: Once | INTRAMUSCULAR | Status: AC
  Administered 2022-08-11: 40000 [IU] via SUBCUTANEOUS
  Filled 2022-08-11: qty 1

## 2022-08-12 ENCOUNTER — Other Ambulatory Visit: Payer: Self-pay

## 2022-08-17 ENCOUNTER — Other Ambulatory Visit: Payer: Self-pay | Admitting: *Deleted

## 2022-08-17 DIAGNOSIS — N401 Enlarged prostate with lower urinary tract symptoms: Secondary | ICD-10-CM

## 2022-08-17 MED ORDER — FINASTERIDE 5 MG PO TABS: 5.0000 mg | ORAL_TABLET | Freq: Every day | ORAL | 3 refills | Status: DC

## 2022-09-12 ENCOUNTER — Inpatient Hospital Stay: Payer: Medicare Other

## 2022-09-12 ENCOUNTER — Inpatient Hospital Stay: Payer: Medicare Other | Attending: Oncology

## 2022-09-12 VITALS — BP 140/55

## 2022-09-12 DIAGNOSIS — D631 Anemia in chronic kidney disease: Secondary | ICD-10-CM | POA: Insufficient documentation

## 2022-09-12 DIAGNOSIS — C9110 Chronic lymphocytic leukemia of B-cell type not having achieved remission: Secondary | ICD-10-CM | POA: Diagnosis present

## 2022-09-12 DIAGNOSIS — N1832 Chronic kidney disease, stage 3b: Secondary | ICD-10-CM | POA: Insufficient documentation

## 2022-09-12 LAB — CBC WITH DIFFERENTIAL/PLATELET
Abs Immature Granulocytes: 0.02 10*3/uL (ref 0.00–0.07)
Basophils Absolute: 0 10*3/uL (ref 0.0–0.1)
Basophils Relative: 0 %
Eosinophils Absolute: 0.1 10*3/uL (ref 0.0–0.5)
Eosinophils Relative: 1 %
HCT: 30.6 % — ABNORMAL LOW (ref 39.0–52.0)
Hemoglobin: 9.8 g/dL — ABNORMAL LOW (ref 13.0–17.0)
Immature Granulocytes: 0 %
Lymphocytes Relative: 68 %
Lymphs Abs: 7.8 10*3/uL — ABNORMAL HIGH (ref 0.7–4.0)
MCH: 33 pg (ref 26.0–34.0)
MCHC: 32 g/dL (ref 30.0–36.0)
MCV: 103 fL — ABNORMAL HIGH (ref 80.0–100.0)
Monocytes Absolute: 1.1 10*3/uL — ABNORMAL HIGH (ref 0.1–1.0)
Monocytes Relative: 9 %
Neutro Abs: 2.6 10*3/uL (ref 1.7–7.7)
Neutrophils Relative %: 22 %
Platelets: 38 10*3/uL — ABNORMAL LOW (ref 150–400)
RBC: 2.97 MIL/uL — ABNORMAL LOW (ref 4.22–5.81)
RDW: 14.7 % (ref 11.5–15.5)
Smear Review: NORMAL
WBC: 11.7 10*3/uL — ABNORMAL HIGH (ref 4.0–10.5)
nRBC: 0 % (ref 0.0–0.2)

## 2022-09-12 MED ORDER — EPOETIN ALFA-EPBX 40000 UNIT/ML IJ SOLN
40000.0000 [IU] | Freq: Once | INTRAMUSCULAR | Status: AC
  Administered 2022-09-12: 40000 [IU] via SUBCUTANEOUS
  Filled 2022-09-12: qty 1

## 2022-10-11 ENCOUNTER — Inpatient Hospital Stay: Payer: Medicare Other | Attending: Oncology | Admitting: Oncology

## 2022-10-11 ENCOUNTER — Encounter: Payer: Self-pay | Admitting: Oncology

## 2022-10-11 ENCOUNTER — Inpatient Hospital Stay: Payer: Medicare Other

## 2022-10-11 VITALS — BP 141/65 | HR 56 | Temp 96.4°F | Resp 17 | Wt 208.4 lb

## 2022-10-11 DIAGNOSIS — D631 Anemia in chronic kidney disease: Secondary | ICD-10-CM

## 2022-10-11 DIAGNOSIS — N189 Chronic kidney disease, unspecified: Secondary | ICD-10-CM

## 2022-10-11 DIAGNOSIS — C9110 Chronic lymphocytic leukemia of B-cell type not having achieved remission: Secondary | ICD-10-CM

## 2022-10-11 DIAGNOSIS — N1832 Chronic kidney disease, stage 3b: Secondary | ICD-10-CM | POA: Diagnosis not present

## 2022-10-11 LAB — CBC WITH DIFFERENTIAL/PLATELET
Abs Immature Granulocytes: 0.01 10*3/uL (ref 0.00–0.07)
Basophils Absolute: 0 10*3/uL (ref 0.0–0.1)
Basophils Relative: 0 %
Eosinophils Absolute: 0.2 10*3/uL (ref 0.0–0.5)
Eosinophils Relative: 1 %
HCT: 29.1 % — ABNORMAL LOW (ref 39.0–52.0)
Hemoglobin: 9.4 g/dL — ABNORMAL LOW (ref 13.0–17.0)
Immature Granulocytes: 0 %
Lymphocytes Relative: 74 %
Lymphs Abs: 9 10*3/uL — ABNORMAL HIGH (ref 0.7–4.0)
MCH: 33 pg (ref 26.0–34.0)
MCHC: 32.3 g/dL (ref 30.0–36.0)
MCV: 102.1 fL — ABNORMAL HIGH (ref 80.0–100.0)
Monocytes Absolute: 0.3 10*3/uL (ref 0.1–1.0)
Monocytes Relative: 3 %
Neutro Abs: 2.7 10*3/uL (ref 1.7–7.7)
Neutrophils Relative %: 22 %
Platelets: 39 10*3/uL — ABNORMAL LOW (ref 150–400)
RBC: 2.85 MIL/uL — ABNORMAL LOW (ref 4.22–5.81)
RDW: 14.7 % (ref 11.5–15.5)
Smear Review: NORMAL
WBC: 12.3 10*3/uL — ABNORMAL HIGH (ref 4.0–10.5)
nRBC: 0 % (ref 0.0–0.2)

## 2022-10-11 MED ORDER — EPOETIN ALFA-EPBX 40000 UNIT/ML IJ SOLN
40000.0000 [IU] | Freq: Once | INTRAMUSCULAR | Status: AC
  Administered 2022-10-11: 40000 [IU] via SUBCUTANEOUS

## 2022-10-11 NOTE — Progress Notes (Signed)
[place]  Telephone:([phone] [phone]  ID: Patrick Altes Hardwick Jr. OB: [birth year]  MR#: [id number]  WUG#:[id number]  Patient Care Team: Idelle Crouch, MD as PCP - General (Internal Medicine) Lloyd Huger, MD as Consulting Physician (Oncology)  CHIEF COMPLAINT: CLL, anemia secondary to chronic renal insufficiency  INTERVAL HISTORY: Patient returns to clinic today for repeat laboratory work, further evaluation, and continuation of Retacrit.  He continues to have chronic weakness and fatigue.  He denies any fevers, night sweats, or weight loss. He has noted no new lymphadenopathy. He has occasional lightheadedness/dizziness, but no other.  He denies any chest pain, shortness of breath, cough, or hemoptysis.  He denies any nausea, vomiting, constipation, or diarrhea. He has no urinary complaints.  Patient offers no further specific complaints today.  REVIEW OF SYSTEMS:   Review of Systems  Constitutional:  Positive for malaise/fatigue. Negative for diaphoresis, fever and weight loss.  Respiratory: Negative.  Negative for cough and shortness of breath.   Cardiovascular: Negative.  Negative for chest pain and leg swelling.  Gastrointestinal: Negative.  Negative for abdominal pain, blood in stool and melena.  Genitourinary: Negative.  Negative for dysuria.  Musculoskeletal: Negative.  Negative for back pain.  Skin: Negative.  Negative for rash.  Neurological:  Positive for weakness. Negative for dizziness, sensory change, focal weakness and headaches.  Psychiatric/Behavioral: Negative.  The patient is not nervous/anxious.     As per HPI. Otherwise, a complete review of systems is negative.  PAST MEDICAL HISTORY: Past Medical History:  Diagnosis Date   Anxiety    CKD (chronic kidney disease), stage III (HCC)    CLL (chronic lymphocytic leukemia) ([place])    Diabetes mellitus without complication ([place])    History of kidney stones    HLD  (hyperlipidemia)    HTN (hypertension)    PKD (polycystic kidney disease)    Sleep apnea     PAST SURGICAL HISTORY: Past Surgical History:  Procedure Laterality Date   CARDIAC CATHETERIZATION  12/1987   CENTRAL LINE INSERTION N/A 04/11/2017   Procedure: Central Line Insertion;  Surgeon: Katha Cabal, MD;  Location: [facility];  Service: Cardiovascular;  Laterality: N/A;   CERVICAL FUSION     CHOLECYSTECTOMY     CYSTOSCOPY W/ RETROGRADES Left 05/12/2017   Procedure: CYSTOSCOPY WITH RETROGRADE PYELOGRAM;  Surgeon: Nickie Retort, MD;  Location: [facility];  Service: Urology;  Laterality: Left;   CYSTOSCOPY W/ URETERAL STENT PLACEMENT Left 05/12/2017   Procedure: CYSTOSCOPY WITH STENT REMOVAL;  Surgeon: Nickie Retort, MD;  Location: [facility];  Service: Urology;  Laterality: Left;   CYSTOSCOPY WITH STENT PLACEMENT Left 04/11/2017   Procedure: CYSTOSCOPY WITH STENT PLACEMENT;  Surgeon: Festus Aloe, MD;  Location: [facility];  Service: Urology;  Laterality: Left;   URETEROSCOPY  05/12/2017   Procedure: URETEROSCOPY;  Surgeon: Nickie Retort, MD;  Location: [facility];  Service: Urology;;    FAMILY HISTORY: Reported history of ovarian and lung cancer. Diabetes, hypertension.     ADVANCED DIRECTIVES:    HEALTH MAINTENANCE: Social History   Tobacco Use   Smoking status: Former    Passive exposure: Past   Smokeless tobacco: Never   Tobacco comments:    quit in Feb of 1997  Substance Use Topics   Alcohol use: No   Drug use: No     Colonoscopy:  PAP:  Bone density:  Lipid panel:  Allergies  Allergen Reactions   Celecoxib Other (See Comments)  Increased Blood Pressure   Chlorpromazine Nausea Only   Prednisone Other (See Comments)    Diabetic   Amoxicillin-Pot Clavulanate Rash    Has patient had a PCN reaction causing immediate rash, facial/tongue/throat swelling, SOB or lightheadedness with hypotension: No Has patient had a PCN reaction causing  severe rash involving mucus membranes or skin necrosis: No Has patient had a PCN reaction that required hospitalization: No Has patient had a PCN reaction occurring within the last 10 years: Yes If all of the above answers are "NO", then may proceed with Cephalosporin use.    Penicillin V Potassium Rash    Current Outpatient Medications  Medication Sig Dispense Refill   Aflibercept 2 MG/0.05ML SOLN Inject 1 Dose into the eye as directed. One injection into left eye every 8-9 weeks     allopurinol (ZYLOPRIM) 100 MG tablet Take 100 mg by mouth daily.     atorvastatin (LIPITOR) 10 MG tablet Take 10 mg by mouth at bedtime.      BD PEN NEEDLE NANO 2ND GEN 32G X 4 MM MISC USE AS DIRECTED ONCE A DAY     carvedilol (COREG) 12.5 MG tablet Take 12.5 mg by mouth 2 (two) times daily with a meal.     Cholecalciferol (VITAMIN D-3) 5000 units TABS Take 5,000 Units by mouth at bedtime.      clonazePAM (KLONOPIN) 1 MG tablet Take 1 mg by mouth at bedtime.      CONTOUR NEXT TEST test strip daily.     cyanocobalamin 500 MCG tablet Take 1,000 mcg by mouth at bedtime.      empagliflozin (JARDIANCE) 10 MG TABS tablet      fexofenadine (ALLEGRA) 180 MG tablet Take 180 mg by mouth daily as needed for allergies.      finasteride (PROSCAR) 5 MG tablet Take 1 tablet (5 mg total) by mouth daily. 90 tablet 3   insulin glargine (LANTUS) 100 UNIT/ML injection Inject 50 Units into the skin daily.     LORazepam (ATIVAN) 1 MG tablet Take 1 mg by mouth every 8 (eight) hours as needed for anxiety (takes 1 tablet every morning.  Rarely has to take again during the day).      Multiple Vitamins-Minerals (PRESERVISION AREDS 2 PO) Take 1 capsule by mouth 2 (two) times daily.      omega-3 acid ethyl esters (LOVAZA) 1 g capsule Take by mouth 2 (two) times daily.     tamsulosin (FLOMAX) 0.4 MG CAPS capsule Take 1 capsule (0.4 mg total) by mouth daily after supper. 90 capsule 3   telmisartan (MICARDIS) 40 MG tablet Take 40 mg by  mouth daily.     triamcinolone (NASACORT) 55 MCG/ACT AERO nasal inhaler Place 2 sprays into the nose 2 (two) times daily as needed (allergies).      COVID-19 mRNA vaccine 2023-2024 (COMIRNATY) syringe Inject into the muscle. 0.3 mL 0   meclizine (ANTIVERT) 25 MG tablet Take 25 mg by mouth every 8 (eight) hours as needed. (Patient not taking: Reported on 10/11/2022)     No current facility-administered medications for this visit.   Facility-Administered Medications Ordered in Other Visits  Medication Dose Route Frequency Provider Last Rate Last Admin   epoetin alfa-epbx (RETACRIT) injection 40,000 Units  40,000 Units Subcutaneous Once Lloyd Huger, MD        OBJECTIVE: Vitals:   10/11/22 1322  BP: (!) 141/65  Pulse: (!) 56  Resp: 17  Temp: (!) 96.4 F (35.8 C)  SpO2: 100%  Body mass index is 33.64 kg/m.    ECOG FS:0 - Asymptomatic  General: Well-developed, well-nourished, no acute distress. Eyes: Pink conjunctiva, anicteric sclera. HEENT: Normocephalic, moist mucous membranes. Lungs: No audible wheezing or coughing. Heart: Regular rate and rhythm. Abdomen: Soft, nontender, no obvious distention. Musculoskeletal: No edema, cyanosis, or clubbing. Neuro: Alert, answering all questions appropriately. Cranial nerves grossly intact. Skin: No rashes or petechiae noted. Psych: Normal affect.  LAB RESULTS:  Lab Results  Component Value Date   NA 140 11/19/2019   K 3.9 11/19/2019   CL 109 11/19/2019   CO2 21 (L) 11/19/2019   GLUCOSE 159 (H) 11/19/2019   BUN 33 (H) 11/19/2019   CREATININE 1.89 (H) 11/19/2019   CALCIUM 9.6 11/19/2019   PROT 6.1 (L) 04/09/2017   ALBUMIN 2.5 (L) 04/17/2017   AST 22 04/09/2017   ALT 19 04/09/2017   ALKPHOS 76 04/09/2017   BILITOT 0.9 04/09/2017   GFRNONAA 32 (L) 11/19/2019   GFRAA 37 (L) 11/19/2019    Lab Results  Component Value Date   WBC 12.3 (H) 10/11/2022   NEUTROABS 2.7 10/11/2022   HGB 9.4 (L) 10/11/2022   HCT 29.1 (L)  10/11/2022   MCV 102.1 (H) 10/11/2022   PLT 39 (L) 10/11/2022   Lab Results  Component Value Date   IRON 100 08/25/2021   TIBC 333 08/25/2021   IRONPCTSAT 30 08/25/2021   Lab Results  Component Value Date   FERRITIN 31 08/25/2021     STUDIES: No results found.  ASSESSMENT: Rai stage I CLL, anemia secondary to chronic renal insufficiency.  PLAN:    1. CLL: Chronic and unchanged.  Patient's total white blood cell count has ranged between 11.7 and 24.8 since May 2013.  Today's result was 12.3.  No intervention is needed.  Patient does not require bone marrow biopsy or repeat imaging.  Continue simple observation.   2. Thrombocytopenia: Chronic and unchanged.  Patient's platelet count was as high as 95 in June 2016.  But more recently between has ranged from 37-63 since June 2019.  Today's result is 39.  Continue simple observation as above. 3.  Chronic renal insufficiency: Chronic and unchanged.  Continue monitoring and treatment per nephrology.    4. Lymphadenopathy: CT scan results from April 09, 2017 reviewed independently with enlarged pelvic and retroperitoneal lymph nodes. These were also noted on a scan in 2012 appear to be slightly larger. No intervention is needed. No further imaging is necessary unless there is suspicion of progression of disease.   5.  Anemia secondary to chronic renal insufficiency: Chronic and unchanged.  Patient's hemoglobin is 9.4 today. Previously all of his other laboratory work including iron panel are either negative or within normal limits.  Proceed with 40,000 units Retacrit today.  Return to clinic in 1 and 2 months for laboratory work only and consideration of Retacrit if his hemoglobin remains below 10.0.  Patient will then return to clinic in 3 months with repeat laboratory work and further evaluation.  6.  Disposition: Follow-up as above.  Will attempt to coordinate visits with patient's wife.  Patient expressed understanding and was in agreement  with this plan. He also understands that He can call clinic at any time with any questions, concerns, or complaints.   Lloyd Huger, MD   10/11/2022 2:02 PM

## 2022-10-11 NOTE — Progress Notes (Signed)
Stay's tired all of the time. Does report dizziness. No visible blood seen. Denies pain. Appetite is terrible. Eats 1 good meal per day.

## 2022-10-12 ENCOUNTER — Encounter: Payer: Self-pay | Admitting: Oncology

## 2022-11-04 ENCOUNTER — Encounter: Payer: Self-pay | Admitting: Oncology

## 2022-11-08 ENCOUNTER — Encounter: Payer: Self-pay | Admitting: Oncology

## 2022-11-11 ENCOUNTER — Inpatient Hospital Stay: Payer: Medicare Other | Attending: Oncology

## 2022-11-11 ENCOUNTER — Inpatient Hospital Stay: Payer: Medicare Other

## 2022-11-11 VITALS — BP 137/62

## 2022-11-11 DIAGNOSIS — C9110 Chronic lymphocytic leukemia of B-cell type not having achieved remission: Secondary | ICD-10-CM | POA: Diagnosis present

## 2022-11-11 DIAGNOSIS — N1832 Chronic kidney disease, stage 3b: Secondary | ICD-10-CM | POA: Diagnosis present

## 2022-11-11 DIAGNOSIS — D631 Anemia in chronic kidney disease: Secondary | ICD-10-CM | POA: Diagnosis present

## 2022-11-11 LAB — CBC WITH DIFFERENTIAL/PLATELET
Abs Immature Granulocytes: 0.01 10*3/uL (ref 0.00–0.07)
Basophils Absolute: 0 10*3/uL (ref 0.0–0.1)
Basophils Relative: 0 %
Eosinophils Absolute: 0.2 10*3/uL (ref 0.0–0.5)
Eosinophils Relative: 1 %
HCT: 27.6 % — ABNORMAL LOW (ref 39.0–52.0)
Hemoglobin: 8.9 g/dL — ABNORMAL LOW (ref 13.0–17.0)
Immature Granulocytes: 0 %
Lymphocytes Relative: 66 %
Lymphs Abs: 8.5 10*3/uL — ABNORMAL HIGH (ref 0.7–4.0)
MCH: 33.1 pg (ref 26.0–34.0)
MCHC: 32.2 g/dL (ref 30.0–36.0)
MCV: 102.6 fL — ABNORMAL HIGH (ref 80.0–100.0)
Monocytes Absolute: 1.1 10*3/uL — ABNORMAL HIGH (ref 0.1–1.0)
Monocytes Relative: 8 %
Neutro Abs: 3.2 10*3/uL (ref 1.7–7.7)
Neutrophils Relative %: 25 %
Platelets: 42 10*3/uL — ABNORMAL LOW (ref 150–400)
RBC: 2.69 MIL/uL — ABNORMAL LOW (ref 4.22–5.81)
RDW: 14.8 % (ref 11.5–15.5)
Smear Review: NORMAL
WBC: 13 10*3/uL — ABNORMAL HIGH (ref 4.0–10.5)
nRBC: 0 % (ref 0.0–0.2)

## 2022-11-11 MED ORDER — EPOETIN ALFA-EPBX 40000 UNIT/ML IJ SOLN
40000.0000 [IU] | Freq: Once | INTRAMUSCULAR | Status: AC
  Administered 2022-11-11: 40000 [IU] via SUBCUTANEOUS
  Filled 2022-11-11: qty 1

## 2022-12-12 ENCOUNTER — Inpatient Hospital Stay: Payer: Medicare Other

## 2022-12-12 ENCOUNTER — Inpatient Hospital Stay: Payer: Medicare Other | Attending: Oncology

## 2022-12-12 VITALS — BP 120/65 | HR 58

## 2022-12-12 DIAGNOSIS — C9110 Chronic lymphocytic leukemia of B-cell type not having achieved remission: Secondary | ICD-10-CM | POA: Insufficient documentation

## 2022-12-12 DIAGNOSIS — D631 Anemia in chronic kidney disease: Secondary | ICD-10-CM | POA: Diagnosis present

## 2022-12-12 DIAGNOSIS — N1832 Chronic kidney disease, stage 3b: Secondary | ICD-10-CM | POA: Diagnosis present

## 2022-12-12 LAB — CBC WITH DIFFERENTIAL/PLATELET
Abs Immature Granulocytes: 0.02 10*3/uL (ref 0.00–0.07)
Basophils Absolute: 0 10*3/uL (ref 0.0–0.1)
Basophils Relative: 0 %
Eosinophils Absolute: 0.1 10*3/uL (ref 0.0–0.5)
Eosinophils Relative: 1 %
HCT: 29.5 % — ABNORMAL LOW (ref 39.0–52.0)
Hemoglobin: 9.2 g/dL — ABNORMAL LOW (ref 13.0–17.0)
Immature Granulocytes: 0 %
Lymphocytes Relative: 70 %
Lymphs Abs: 10.3 10*3/uL — ABNORMAL HIGH (ref 0.7–4.0)
MCH: 32.2 pg (ref 26.0–34.0)
MCHC: 31.2 g/dL (ref 30.0–36.0)
MCV: 103.1 fL — ABNORMAL HIGH (ref 80.0–100.0)
Monocytes Absolute: 1.3 10*3/uL — ABNORMAL HIGH (ref 0.1–1.0)
Monocytes Relative: 9 %
Neutro Abs: 3 10*3/uL (ref 1.7–7.7)
Neutrophils Relative %: 20 %
Platelets: 57 10*3/uL — ABNORMAL LOW (ref 150–400)
RBC: 2.86 MIL/uL — ABNORMAL LOW (ref 4.22–5.81)
RDW: 14.6 % (ref 11.5–15.5)
Smear Review: NORMAL
WBC: 14.7 10*3/uL — ABNORMAL HIGH (ref 4.0–10.5)
nRBC: 0 % (ref 0.0–0.2)

## 2022-12-12 MED ORDER — EPOETIN ALFA-EPBX 40000 UNIT/ML IJ SOLN
40000.0000 [IU] | Freq: Once | INTRAMUSCULAR | Status: AC
  Administered 2022-12-12: 40000 [IU] via SUBCUTANEOUS
  Filled 2022-12-12: qty 1

## 2022-12-13 ENCOUNTER — Encounter: Payer: Self-pay | Admitting: Oncology

## 2022-12-14 ENCOUNTER — Encounter: Payer: Self-pay | Admitting: Emergency Medicine

## 2022-12-14 ENCOUNTER — Emergency Department: Payer: Medicare Other

## 2022-12-14 ENCOUNTER — Other Ambulatory Visit: Payer: Self-pay

## 2022-12-14 ENCOUNTER — Inpatient Hospital Stay
Admission: EM | Admit: 2022-12-14 | Discharge: 2022-12-19 | DRG: 682 | Disposition: A | Discharge status: Home Health | Payer: Medicare Other | Attending: Obstetrics and Gynecology | Admitting: Obstetrics and Gynecology

## 2022-12-14 DIAGNOSIS — E1122 Type 2 diabetes mellitus with diabetic chronic kidney disease: Secondary | ICD-10-CM

## 2022-12-14 DIAGNOSIS — N1832 Chronic kidney disease, stage 3b: Secondary | ICD-10-CM | POA: Diagnosis present

## 2022-12-14 DIAGNOSIS — N189 Chronic kidney disease, unspecified: Secondary | ICD-10-CM | POA: Diagnosis present

## 2022-12-14 DIAGNOSIS — Z87442 Personal history of urinary calculi: Secondary | ICD-10-CM

## 2022-12-14 DIAGNOSIS — N179 Acute kidney failure, unspecified: Secondary | ICD-10-CM | POA: Diagnosis not present

## 2022-12-14 DIAGNOSIS — Z87891 Personal history of nicotine dependence: Secondary | ICD-10-CM

## 2022-12-14 DIAGNOSIS — E785 Hyperlipidemia, unspecified: Secondary | ICD-10-CM | POA: Diagnosis present

## 2022-12-14 DIAGNOSIS — I714 Abdominal aortic aneurysm, without rupture, unspecified: Secondary | ICD-10-CM | POA: Diagnosis present

## 2022-12-14 DIAGNOSIS — R338 Other retention of urine: Secondary | ICD-10-CM | POA: Diagnosis present

## 2022-12-14 DIAGNOSIS — Z981 Arthrodesis status: Secondary | ICD-10-CM

## 2022-12-14 DIAGNOSIS — J189 Pneumonia, unspecified organism: Principal | ICD-10-CM | POA: Insufficient documentation

## 2022-12-14 DIAGNOSIS — C9110 Chronic lymphocytic leukemia of B-cell type not having achieved remission: Secondary | ICD-10-CM | POA: Diagnosis present

## 2022-12-14 DIAGNOSIS — D631 Anemia in chronic kidney disease: Secondary | ICD-10-CM | POA: Diagnosis present

## 2022-12-14 DIAGNOSIS — I10 Essential (primary) hypertension: Secondary | ICD-10-CM | POA: Diagnosis present

## 2022-12-14 DIAGNOSIS — R339 Retention of urine, unspecified: Secondary | ICD-10-CM | POA: Diagnosis not present

## 2022-12-14 DIAGNOSIS — N1330 Unspecified hydronephrosis: Secondary | ICD-10-CM | POA: Diagnosis present

## 2022-12-14 DIAGNOSIS — R197 Diarrhea, unspecified: Secondary | ICD-10-CM

## 2022-12-14 DIAGNOSIS — D696 Thrombocytopenia, unspecified: Secondary | ICD-10-CM

## 2022-12-14 DIAGNOSIS — Z9049 Acquired absence of other specified parts of digestive tract: Secondary | ICD-10-CM

## 2022-12-14 DIAGNOSIS — E872 Acidosis, unspecified: Secondary | ICD-10-CM | POA: Diagnosis present

## 2022-12-14 DIAGNOSIS — E860 Dehydration: Secondary | ICD-10-CM

## 2022-12-14 DIAGNOSIS — Z6825 Body mass index (BMI) 25.0-25.9, adult: Secondary | ICD-10-CM

## 2022-12-14 DIAGNOSIS — Z888 Allergy status to other drugs, medicaments and biological substances status: Secondary | ICD-10-CM

## 2022-12-14 DIAGNOSIS — N401 Enlarged prostate with lower urinary tract symptoms: Secondary | ICD-10-CM | POA: Diagnosis present

## 2022-12-14 DIAGNOSIS — I7143 Infrarenal abdominal aortic aneurysm, without rupture: Secondary | ICD-10-CM | POA: Diagnosis present

## 2022-12-14 DIAGNOSIS — F419 Anxiety disorder, unspecified: Secondary | ICD-10-CM | POA: Diagnosis present

## 2022-12-14 DIAGNOSIS — Z794 Long term (current) use of insulin: Secondary | ICD-10-CM

## 2022-12-14 DIAGNOSIS — E669 Obesity, unspecified: Secondary | ICD-10-CM | POA: Diagnosis present

## 2022-12-14 DIAGNOSIS — N134 Hydroureter: Secondary | ICD-10-CM | POA: Diagnosis not present

## 2022-12-14 DIAGNOSIS — Q613 Polycystic kidney, unspecified: Secondary | ICD-10-CM

## 2022-12-14 DIAGNOSIS — Z79899 Other long term (current) drug therapy: Secondary | ICD-10-CM

## 2022-12-14 DIAGNOSIS — I129 Hypertensive chronic kidney disease with stage 1 through stage 4 chronic kidney disease, or unspecified chronic kidney disease: Secondary | ICD-10-CM | POA: Diagnosis present

## 2022-12-14 DIAGNOSIS — Z881 Allergy status to other antibiotic agents status: Secondary | ICD-10-CM

## 2022-12-14 DIAGNOSIS — Z880 Allergy status to penicillin: Secondary | ICD-10-CM

## 2022-12-14 LAB — BASIC METABOLIC PANEL
Anion gap: 7 (ref 5–15)
BUN: 50 mg/dL — ABNORMAL HIGH (ref 8–23)
CO2: 21 mmol/L — ABNORMAL LOW (ref 22–32)
Calcium: 8.4 mg/dL — ABNORMAL LOW (ref 8.9–10.3)
Chloride: 109 mmol/L (ref 98–111)
Creatinine, Ser: 2.61 mg/dL — ABNORMAL HIGH (ref 0.61–1.24)
GFR, Estimated: 23 mL/min — ABNORMAL LOW (ref >60)
Glucose, Bld: 131 mg/dL — ABNORMAL HIGH (ref 70–99)
Potassium: 4.2 mmol/L (ref 3.5–5.1)
Sodium: 137 mmol/L (ref 135–145)

## 2022-12-14 LAB — URINALYSIS, ROUTINE W REFLEX MICROSCOPIC
Bacteria, UA: NONE SEEN
Bilirubin Urine: NEGATIVE
Glucose, UA: >=500 mg/dL — AB
Hgb urine dipstick: NEGATIVE
Ketones, ur: NEGATIVE mg/dL
Leukocytes,Ua: NEGATIVE
Nitrite: NEGATIVE
Protein, ur: 100 mg/dL — AB
Specific Gravity, Urine: 1.007 (ref 1.005–1.030)
Squamous Epithelial / HPF: NONE SEEN /HPF (ref 0–5)
pH: 5 (ref 5.0–8.0)

## 2022-12-14 LAB — CBC
HCT: 28.4 % — ABNORMAL LOW (ref 39.0–52.0)
Hemoglobin: 8.9 g/dL — ABNORMAL LOW (ref 13.0–17.0)
MCH: 32.4 pg (ref 26.0–34.0)
MCHC: 31.3 g/dL (ref 30.0–36.0)
MCV: 103.3 fL — ABNORMAL HIGH (ref 80.0–100.0)
Platelets: 58 10*3/uL — ABNORMAL LOW (ref 150–400)
RBC: 2.75 MIL/uL — ABNORMAL LOW (ref 4.22–5.81)
RDW: 15.1 % (ref 11.5–15.5)
WBC: 12.8 10*3/uL — ABNORMAL HIGH (ref 4.0–10.5)
nRBC: 0 % (ref 0.0–0.2)

## 2022-12-14 MED ORDER — SODIUM CHLORIDE 0.9 % IV SOLN
500.0000 mg | Freq: Once | INTRAVENOUS | Status: AC
  Administered 2022-12-14: 500 mg via INTRAVENOUS
  Filled 2022-12-14: qty 5

## 2022-12-14 MED ORDER — HYDRALAZINE HCL 20 MG/ML IJ SOLN
10.0000 mg | Freq: Once | INTRAMUSCULAR | Status: AC
  Administered 2022-12-14: 10 mg via INTRAVENOUS
  Filled 2022-12-14: qty 1

## 2022-12-14 MED ORDER — ONDANSETRON HCL 4 MG/2ML IJ SOLN
4.0000 mg | Freq: Once | INTRAMUSCULAR | Status: AC
  Administered 2022-12-14: 4 mg via INTRAVENOUS
  Filled 2022-12-14: qty 2

## 2022-12-14 MED ORDER — SODIUM CHLORIDE 0.9 % IV BOLUS
1000.0000 mL | Freq: Once | INTRAVENOUS | Status: AC
  Administered 2022-12-14: 1000 mL via INTRAVENOUS

## 2022-12-14 MED ORDER — SODIUM CHLORIDE 0.9 % IV SOLN
2.0000 g | Freq: Once | INTRAVENOUS | Status: AC
  Administered 2022-12-14: 2 g via INTRAVENOUS
  Filled 2022-12-14: qty 20

## 2022-12-14 MED ORDER — LACTATED RINGERS IV SOLN: INTRAVENOUS | Status: DC

## 2022-12-14 NOTE — Assessment & Plan Note (Signed)
Stable. Continue coreg. Hold ARB due to ARF.

## 2022-12-14 NOTE — Assessment & Plan Note (Signed)
Start IV rocephin/po zithromax. RLL pneumonia is small and likely incidental. Has been on doxycycline for 7 days.

## 2022-12-14 NOTE — ED Notes (Signed)
Bladder scan showing approx 28m.

## 2022-12-14 NOTE — ED Triage Notes (Signed)
Patient to ED via POV for urinary retention. Patient states the past time he urinated was at 0830 and states he has had the urge to go but unable to. Also states he only has one good kidney. Denies any pain at this time.

## 2022-12-14 NOTE — Assessment & Plan Note (Signed)
Chronic. Followed by nephrology and urology as outpatient.

## 2022-12-14 NOTE — ED Provider Notes (Signed)
[facility] Provider Note    Event Date/Time   First MD Initiated Contact with Patient 12/14/22 1819     (approximate)   History   Urinary Retention   HPI  Patrick Payne. is a 89 y.o. male  with h/o PCOS with one functioning kidney, CKD, here with decreased UOP. Pt states he had a cough, cold, and subjective fevers last week. He saw his PCP and was started on doxycycline. He states that he had some mild improvement in thos sx but over the last few days has had decreased appetite again, fatigue. He says today, he has only urinated once which is abnormal for him. He's had some mild abd discomfort but no persistent pain. No current fevers. He has had continued cough. No dysuria. He did take some mucinex OTC last week with his cough but no other new meds or OTC meds.       Physical Exam   Triage Vital Signs: ED Triage Vitals  Enc Vitals Group     BP 12/14/22 1606 (!) 160/71     Pulse Rate 12/14/22 1606 63     Resp 12/14/22 1606 18     Temp 12/14/22 1606 97.6 F (36.4 C)     Temp Source 12/14/22 1606 Oral     SpO2 12/14/22 1606 95 %     Weight --      Height --      Head Circumference --      Peak Flow --      Pain Score 12/14/22 1607 0     Pain Loc --      Pain Edu? --      Excl. in [place]? --     Most recent vital signs: Vitals:   12/14/22 2215 12/14/22 2343  BP: (!) 178/64 (!) 164/64  Pulse: 68 60  Resp: 19 16  Temp: 98.4 F (36.9 C) 98.5 F (36.9 C)  SpO2: 95% 96%     General: Awake, no distress.  CV:  Good peripheral perfusion. RRR. Resp:  Normal work of breathing. Lungs clear to auscultation bilaterallyl Abd:  No distention. No tenderness. No rebound or guarding. Other:  Mildly dry MM.   ED Results / Procedures / Treatments   Labs (all labs ordered are listed, but only abnormal results are displayed) Labs Reviewed  URINALYSIS, ROUTINE W REFLEX MICROSCOPIC - Abnormal; Notable for the following components:      Result  Value   Color, Urine STRAW (*)    APPearance CLEAR (*)    Glucose, UA >=500 (*)    Protein, ur 100 (*)    All other components within normal limits  BASIC METABOLIC PANEL - Abnormal; Notable for the following components:   CO2 21 (*)    Glucose, Bld 131 (*)    BUN 50 (*)    Creatinine, Ser 2.61 (*)    Calcium 8.4 (*)    GFR, Estimated 23 (*)    All other components within normal limits  CBC - Abnormal; Notable for the following components:   WBC 12.8 (*)    RBC 2.75 (*)    Hemoglobin 8.9 (*)    HCT 28.4 (*)    MCV 103.3 (*)    Platelets 58 (*)    All other components within normal limits  C DIFFICILE QUICK SCREEN W PCR REFLEX    GASTROINTESTINAL PANEL BY PCR, STOOL (REPLACES STOOL CULTURE)  PROCALCITONIN  COMPREHENSIVE METABOLIC PANEL  CBC WITH DIFFERENTIAL/PLATELET  MAGNESIUM  PROCALCITONIN     EKG    RADIOLOGY CT Stone: Mild L hydro, periureteral stranding, patchy opacity in RLL   I also independently reviewed and agree with radiologist interpretations.   PROCEDURES:  Critical Care performed: No   MEDICATIONS ORDERED IN ED: Medications  atorvastatin (LIPITOR) tablet 10 mg (has no administration in time range)  carvedilol (COREG) tablet 12.5 mg (has no administration in time range)  LORazepam (ATIVAN) tablet 1 mg (has no administration in time range)  insulin glargine-yfgn (SEMGLEE) injection 20 Units (has no administration in time range)  finasteride (PROSCAR) tablet 5 mg (has no administration in time range)  tamsulosin (FLOMAX) capsule 0.4 mg (has no administration in time range)  clonazePAM (KLONOPIN) tablet 1 mg (1 mg Oral Given 12/15/22 0112)  cefTRIAXone (ROCEPHIN) 2 g in sodium chloride 0.9 % 100 mL IVPB (has no administration in time range)  azithromycin (ZITHROMAX) tablet 500 mg (has no administration in time range)  acetaminophen (TYLENOL) tablet 650 mg (has no administration in time range)    Or  acetaminophen (TYLENOL) suppository 650 mg (has  no administration in time range)  ondansetron (ZOFRAN) tablet 4 mg (has no administration in time range)    Or  ondansetron (ZOFRAN) injection 4 mg (has no administration in time range)  lactated ringers infusion ( Intravenous New Bag/Given 12/14/22 2346)  sodium chloride 0.9 % bolus 1,000 mL (0 mLs Intravenous Stopped 12/14/22 2103)  cefTRIAXone (ROCEPHIN) 2 g in sodium chloride 0.9 % 100 mL IVPB (0 g Intravenous Stopped 12/14/22 2103)  azithromycin (ZITHROMAX) 500 mg in sodium chloride 0.9 % 250 mL IVPB (0 mg Intravenous Stopped 12/14/22 2346)  ondansetron (ZOFRAN) injection 4 mg (4 mg Intravenous Given 12/14/22 2218)  hydrALAZINE (APRESOLINE) injection 10 mg (10 mg Intravenous Given 12/14/22 2356)     IMPRESSION / MDM / ASSESSMENT AND PLAN / ED COURSE  I reviewed the triage vital signs and the nursing notes.                              Differential diagnosis includes, but is not limited to, urinary retention from stone, medications, decreased UOP from dehydration or recent infection, AoCKD  Patient's presentation is most consistent with acute presentation with potential threat to life or bodily function.  The patient is on the cardiac monitor to evaluate for evidence of arrhythmia and/or significant heart rate changes  89 yo M with PMHx as above including CKD with solitary kidney here with decreased UOP and fatigue. Labs show baseline CKD with possible slight dehydration, and mild leukocytosis. Hgb at baseline. CT obtained and shows signs of PNA (cough last week), as well as possible mild hydro and stranding around ureter. UA negative. However, given his age, +PNA and ? Of pyelo on CT in a solitary kidney with CKD, will start on fluids, ABX and admit to hospitalist service for observation.    FINAL CLINICAL IMPRESSION(S) / ED DIAGNOSES   Final diagnoses:  Community acquired pneumonia of right lower lobe of lung  Dehydration     Rx / DC Orders   ED Discharge Orders     None         Note:  This document was prepared using Dragon voice recognition software and may include unintentional dictation errors.   Duffy Bruce, MD 12/15/22 [phone]

## 2022-12-14 NOTE — Assessment & Plan Note (Signed)
Slightly worse than his baseline. Continue with IVF. Followed by nephrology and urology as outpatient.

## 2022-12-14 NOTE — Assessment & Plan Note (Addendum)
No stone seen on CT. Pt may need urology consult. Unclear the etiology of the hydroureter.

## 2022-12-14 NOTE — Assessment & Plan Note (Signed)
Now 5 cm x 5 cm on CT abd. Was 3.9 cm x 3.8 cm in 2018.

## 2022-12-14 NOTE — ED Notes (Signed)
Post void bladder scan is 69m. Pt voided 2938mof urine.

## 2022-12-14 NOTE — H&P (Signed)
History and Physical    Athol Pollak [employer]. [id number] DOB: [birth year] DOA: 12/14/2022  DOS: the patient was seen and examined on 12/14/2022  PCP: Idelle Crouch, MD   Patient coming from: Home  I have personally briefly reviewed patient's old medical records in [place]  CC: decreased urine output HPI: 89 year old male history of CKD stage IIIb, history of polycystic kidneys on the right, type 2 diabetes on insulin, hypertension, hyperlipidemia, history of CLL, history anxiety, presents to the ER today with complaints of decreased urine output.  Patient was seen by his primary care doctor on 12/06/2022 due to complaints of cough.  At that time he had complained of at least 5 days of coughing.  Patient states that he had an x-ray performed but no one can seem to find the results of the x-ray.  He was started on doxycycline along with some Zofran.  He states the doxycycline made him extremely nauseous but he had to take Zofran for it.  He has been on doxycycline for the last 7 days.  He has been eating very little.  He has been drinking fluids.  Has noticed over the last 24 hours, that he is making less urine.  This concerned him and he brought himself to the ER.  He denies any fever or chills.  He developed acute diarrhea today.  He does not have any abdominal pain.  On arrival temp 97.6 heart rate 63 blood pressure 160/71 satting 95% on room air.  White count 12.8, hemoglobin 8.9, platelets of 58,000  Sodium 137, potassium 4.2, bicarb of 21, BUN of 50, creatinine 2.6  UA only showed small amount of protein.  CT urogram showed large polycystic right kidney.  There was some mild left hydroureteronephrosis and some mild periureteral stranding.  There is a 5 x 5 infrarenal abdominal aortic aneurysm previously measured at 3.9 x 3.8 cm in 2018.  Incidentally there is also noted to be a small patchy opacity in the right lower lobe suspicious for pneumonia.  [facility] hospital[facility]  contacted for admission.   ED Course: CT urogram shows left hydroureter, small RLL pneumonia WBC 12.8  Review of Systems:  Review of Systems  Constitutional:  Positive for malaise/fatigue. Negative for chills and fever.  HENT: Negative.    Eyes: Negative.   Respiratory:  Positive for cough.   Gastrointestinal:  Positive for diarrhea and nausea.  Genitourinary:  Negative for dysuria.       Decreased urine output  Musculoskeletal: Negative.   Skin: Negative.   Neurological: Negative.   Endo/Heme/Allergies: Negative.   Psychiatric/Behavioral: Negative.    All other systems reviewed and are negative.   Past Medical History:  Diagnosis Date   Anxiety    CKD (chronic kidney disease), stage III (HCC)    CLL (chronic lymphocytic leukemia) (HCC)    Diabetes mellitus without complication ([place])    Diverticulitis 04/09/2017   History of kidney stones    HLD (hyperlipidemia)    HTN (hypertension)    PKD (polycystic kidney disease)    Sleep apnea    Uncontrolled type 2 diabetes mellitus with hyperglycemia ([place]) 06/28/2018    Past Surgical History:  Procedure Laterality Date   CARDIAC CATHETERIZATION  12/1987   CENTRAL LINE INSERTION N/A 04/11/2017   Procedure: Central Line Insertion;  Surgeon: Katha Cabal, MD;  Location: [facility];  Service: Cardiovascular;  Laterality: N/A;   CERVICAL FUSION     CHOLECYSTECTOMY     CYSTOSCOPY W/  RETROGRADES Left 05/12/2017   Procedure: CYSTOSCOPY WITH RETROGRADE PYELOGRAM;  Surgeon: Nickie Retort, MD;  Location: [facility];  Service: Urology;  Laterality: Left;   CYSTOSCOPY W/ URETERAL STENT PLACEMENT Left 05/12/2017   Procedure: CYSTOSCOPY WITH STENT REMOVAL;  Surgeon: Nickie Retort, MD;  Location: [facility];  Service: Urology;  Laterality: Left;   CYSTOSCOPY WITH STENT PLACEMENT Left 04/11/2017   Procedure: CYSTOSCOPY WITH STENT PLACEMENT;  Surgeon: Festus Aloe, MD;  Location: [facility];  Service: Urology;  Laterality:  Left;   URETEROSCOPY  05/12/2017   Procedure: URETEROSCOPY;  Surgeon: Nickie Retort, MD;  Location: [facility];  Service: Urology;;     reports that he has quit smoking. He has been exposed to tobacco smoke. He has never used smokeless tobacco. He reports that he does not drink alcohol and does not use drugs.  Allergies  Allergen Reactions   Celecoxib Other (See Comments)    Increased Blood Pressure   Chlorpromazine Nausea Only   Prednisone Other (See Comments)    Diabetic   Amoxicillin-Pot Clavulanate Rash    Has patient had a PCN reaction causing immediate rash, facial/tongue/throat swelling, SOB or lightheadedness with hypotension: No Has patient had a PCN reaction causing severe rash involving mucus membranes or skin necrosis: No Has patient had a PCN reaction that required hospitalization: No Has patient had a PCN reaction occurring within the last 10 years: Yes If all of the above answers are "NO", then may proceed with Cephalosporin use.    Penicillin V Potassium Rash    Family History  Problem Relation Age of Onset   Cancer Mother    Varicose Veins Mother    Cancer Father     Prior to Admission medications   Medication Sig Start Date End Date Taking? Authorizing Provider  Aflibercept 2 MG/0.05ML SOLN Inject 1 Dose into the eye as directed. One injection into left eye every 8-9 weeks    [provider]  allopurinol (ZYLOPRIM) 100 MG tablet Take 100 mg by mouth daily.    [provider]  atorvastatin (LIPITOR) 10 MG tablet Take 10 mg by mouth at bedtime.  09/02/15   [provider]  BD PEN NEEDLE NANO 2ND GEN 32G X 4 MM MISC USE AS DIRECTED ONCE A DAY 08/01/22   [provider]  carvedilol (COREG) 12.5 MG tablet Take 12.5 mg by mouth 2 (two) times daily with a meal.    [provider]  Cholecalciferol (VITAMIN D-3) 5000 units TABS Take 5,000 Units by mouth at bedtime.     [provider]  clonazePAM (KLONOPIN) 1 MG  tablet Take 1 mg by mouth at bedtime.  09/06/15   [provider]  CONTOUR NEXT TEST test strip daily. 06/27/22   [provider]  COVID-19 mRNA vaccine [phone] (COMIRNATY) syringe Inject into the muscle. 08/11/22   Carlyle Basques, MD  cyanocobalamin 500 MCG tablet Take 1,000 mcg by mouth at bedtime.     [provider]  empagliflozin (JARDIANCE) 10 MG TABS tablet  03/26/21   [provider]  fexofenadine (ALLEGRA) 180 MG tablet Take 180 mg by mouth daily as needed for allergies.     [provider]  finasteride (PROSCAR) 5 MG tablet Take 1 tablet (5 mg total) by mouth daily. 08/17/22   [name], MD  insulin glargine (LANTUS) 100 UNIT/ML injection Inject 50 Units into the skin daily.    [provider]  LORazepam (ATIVAN) 1 MG tablet Take 1  mg by mouth every 8 (eight) hours as needed for anxiety (takes 1 tablet every morning.  Rarely has to take again during the day).  11/01/15   [provider]  Multiple Vitamins-Minerals (PRESERVISION AREDS 2 PO) Take 1 capsule by mouth 2 (two) times daily.     [provider]  omega-3 acid ethyl esters (LOVAZA) 1 g capsule Take by mouth 2 (two) times daily.    [provider]  tamsulosin (FLOMAX) 0.4 MG CAPS capsule Take 1 capsule (0.4 mg total) by mouth daily after supper. 08/02/22   [name], MD  telmisartan (MICARDIS) 40 MG tablet Take 40 mg by mouth daily.    [provider]  triamcinolone (NASACORT) 55 MCG/ACT AERO nasal inhaler Place 2 sprays into the nose 2 (two) times daily as needed (allergies).     [provider]    Physical Exam: Vitals:   12/14/22 1606 12/14/22 1830 12/14/22 1900  BP: (!) 160/71 (!) 185/63 (!) 184/67  Pulse: 63 (!) 51 (!) 58  Resp: 18  17  Temp: 97.6 F (36.4 C)  98.2 F (36.8 C)  TempSrc: Oral    SpO2: 95% 100% 98%    Physical Exam Vitals and nursing note reviewed.  Constitutional:      General: He is not  in acute distress.    Appearance: Normal appearance. He is obese. He is not ill-appearing, toxic-appearing or diaphoretic.  HENT:     Head: Normocephalic and atraumatic.     Nose: Nose normal.  Eyes:     General: No scleral icterus. Cardiovascular:     Rate and Rhythm: Normal rate and regular rhythm.     Pulses: Normal pulses.     Heart sounds: Murmur heard.  Pulmonary:     Effort: Pulmonary effort is normal. No respiratory distress.     Breath sounds: Normal breath sounds. No wheezing.  Abdominal:     General: Abdomen is protuberant. Bowel sounds are normal. There is no distension.     Palpations: Abdomen is soft.     Tenderness: There is no abdominal tenderness. There is no guarding.  Musculoskeletal:     Right lower leg: No edema.     Left lower leg: No edema.  Skin:    General: Skin is warm and dry.     Capillary Refill: Capillary refill takes less than 2 seconds.  Neurological:     General: No focal deficit present.     Mental Status: He is alert and oriented to person, place, and time.      Labs on Admission: I have personally reviewed following labs and imaging studies  CBC: Recent Labs  Lab 12/12/22 1328 12/14/22 1612  WBC 14.7* 12.8*  NEUTROABS 3.0  --   HGB 9.2* 8.9*  HCT 29.5* 28.4*  MCV 103.1* 103.3*  PLT 57* 58*   Basic Metabolic Panel: Recent Labs  Lab 12/14/22 1612  NA 137  K 4.2  CL 109  CO2 21*  GLUCOSE 131*  BUN 50*  CREATININE 2.61*  CALCIUM 8.4*   Urine analysis:    Component Value Date/Time   COLORURINE STRAW (A) 12/14/2022 1845   APPEARANCEUR CLEAR (A) 12/14/2022 1845   APPEARANCEUR Clear 11/25/2021 1128   LABSPEC 1.007 12/14/2022 1845   PHURINE 5.0 12/14/2022 1845   GLUCOSEU >=500 (A) 12/14/2022 1845   HGBUR NEGATIVE 12/14/2022 1845   BILIRUBINUR NEGATIVE 12/14/2022 1845   BILIRUBINUR Negative 11/25/2021 1128   KETONESUR NEGATIVE 12/14/2022 1845   PROTEINUR 100 (  A) 12/14/2022 1845   NITRITE NEGATIVE 12/14/2022 1845    LEUKOCYTESUR NEGATIVE 12/14/2022 1845    Radiological Exams on Admission: I have personally reviewed images CT Renal Stone Study  Result Date: 12/14/2022 CLINICAL DATA:  Abdominal pain EXAM: CT ABDOMEN AND PELVIS WITHOUT CONTRAST TECHNIQUE: Multidetector CT imaging of the abdomen and pelvis was performed following the standard protocol without IV contrast. RADIATION DOSE REDUCTION: This exam was performed according to the departmental dose-optimization program which includes automated exposure control, adjustment of the mA and/or kV according to patient size and/or use of iterative reconstruction technique. COMPARISON:  CT abdomen/pelvis dated 04/09/2017. Multiple renal ultrasounds, most recently 05/23/2022. FINDINGS: Lower chest: Patchy opacity with peribronchovascular nodularity in the right lower lobe, suspicious for pneumonia. Hepatobiliary: Unenhanced liver is unremarkable. Status post cholecystectomy. No intrahepatic or extrahepatic dilatation. Pancreas: Within normal limits. Spleen: Within normal limits. Adrenals/Urinary Tract: Adrenal glands are within normal limits. Left kidney is notable for a left upper pole renal sinus cyst and an 18 mm simple exophytic cyst along the left lower kidney (series 2/image 41), benign. No follow-up is recommended. Mild left hydroureteronephrosis, new. Mild periureteral stranding (series 2/image 39) raises the possibility of infection. 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 No hydronephrosis. Bladder is underdistended but unremarkable. Stomach/Bowel: Stomach is within normal limits. No evidence of bowel obstruction. Normal appendix (series 2/image 53). Mild left colonic diverticulosis, without evidence of diverticulitis. Vascular/Lymphatic: 5.0 x 5.0 cm infrarenal abdominal aortic aneurysm, previously 3.9 x 3.8 cm in 2018.  Atherosclerotic calcifications of the abdominal aorta and branch vessels. No suspicious abdominopelvic lymphadenopathy. Reproductive: Prostate is unremarkable. Other: No abdominopelvic ascites. Small fat containing midline supraumbilical hernia (series 2/image 44). Musculoskeletal: Degenerative changes of the visualized thoracolumbar spine. IMPRESSION: Mild left hydroureteronephrosis, new. Associated mild periureteral stranding raises the possibility of infection. Patchy opacity with peribronchovascular nodularity in the right lower lobe, suspicious for pneumonia. 5.0 x 5.0 cm infrarenal abdominal aortic aneurysm, previously 3.9 x 3.8 cm in 2018. Recommend follow-up every 6 months and vascular consultation. Reference: J Am Coll Radiol [id number]. Additional stable ancillary findings as above. Electronically Signed   By: Julian Hy M.D.   On: 12/14/2022 19:42    EKG: My personal interpretation of EKG shows: no EKG  Assessment/Plan Principal Problem:   Acute renal failure superimposed on stage 3b chronic kidney disease (HCC) Active Problems:   Right lower lobe pneumonia   Hydroureter on left   Acute diarrhea   Chronic kidney disease, stage 3b (HCC) - baseline SCr 2.2-2.5   Chronic lymphocytic leukemia (HCC)   Type 2 diabetes mellitus with chronic kidney disease, with long-term current use of insulin (HCC)   Essential hypertension   HLD (hyperlipidemia)   Polycystic kidney disease - right side   Thrombocytopenia (HCC)   Abdominal aortic aneurysm (AAA) (HCC)    Assessment and Plan: * Acute renal failure superimposed on stage 3b chronic kidney disease ([place]) Observation med/surg bed. Continue with IVF. Hold nephrotoxic agents. Repeat BMP in AM.  Acute diarrhea Diarrhea started today after being on doxycycline for 7 days. Check c. Diff. Check GI viral panel. Enteric precautions.  Hydroureter on left No stone seen on CT. Pt may need urology consult. Unclear the etiology of the  hydroureter.  Right lower lobe pneumonia Start IV rocephin/po zithromax. RLL pneumonia is small and likely incidental. Has been on doxycycline for 7 days.  Thrombocytopenia (HCC) Chronic. SCDs  for DVT prophylaxis.  Polycystic kidney disease - right side Chronic. Followed by nephrology and urology as outpatient.  HLD (hyperlipidemia) Stable. Continue lipitor 10 mg qhs.  Essential hypertension Stable. Continue coreg. Hold ARB due to ARF.  Type 2 diabetes mellitus with chronic kidney disease, with long-term current use of insulin ([place]) Continue with lantus. Will reduce dose to 20 units qam. Takes 30 units daily at home. Check A1c. Add ssi.  Chronic lymphocytic leukemia (HCC) Stable.  Chronic kidney disease, stage 3b (HCC) - baseline SCr 2.2-2.5 Slightly worse than his baseline. Continue with IVF. Followed by nephrology and urology as outpatient.  Abdominal aortic aneurysm (AAA) (HCC) Now 5 cm x 5 cm on CT abd. Was 3.9 cm x 3.8 cm in 2018.   DVT prophylaxis: SCDs Code Status: Full Code Family Communication: discussed with pt and wife kathleen at bedside  Disposition Plan: return home  Consults called: none  Admission status: Observation, Med-Surg   Kristopher Oppenheim, DO [facility] 12/14/2022, 10:20 PM

## 2022-12-14 NOTE — Assessment & Plan Note (Signed)
Stable. Continue lipitor 10 mg qhs.

## 2022-12-14 NOTE — Assessment & Plan Note (Signed)
Stable. 

## 2022-12-14 NOTE — Assessment & Plan Note (Signed)
Continue with lantus. Will reduce dose to 20 units qam. Takes 30 units daily at home. Check A1c. Add ssi.

## 2022-12-14 NOTE — Assessment & Plan Note (Signed)
Diarrhea started today after being on doxycycline for 7 days. Check c. Diff. Check GI viral panel. Enteric precautions.

## 2022-12-14 NOTE — Assessment & Plan Note (Signed)
Chronic. SCDs for DVT prophylaxis.

## 2022-12-14 NOTE — Subjective & Objective (Signed)
CC: decreased urine output HPI: 89 year old male history of CKD stage IIIb, history of polycystic kidneys on the right, type 2 diabetes on insulin, hypertension, hyperlipidemia, history of CLL, history anxiety, presents to the ER today with complaints of decreased urine output.  Patient was seen by his primary care doctor on 12/06/2022 due to complaints of cough.  At that time he had complained of at least 5 days of coughing.  Patient states that he had an x-ray performed but no one can seem to find the results of the x-ray.  He was started on doxycycline along with some Zofran.  He states the doxycycline made him extremely nauseous but he had to take Zofran for it.  He has been on doxycycline for the last 7 days.  He has been eating very little.  He has been drinking fluids.  Has noticed over the last 24 hours, that he is making less urine.  This concerned him and he brought himself to the ER.  He denies any fever or chills.  He developed acute diarrhea today.  He does not have any abdominal pain.  On arrival temp 97.6 heart rate 63 blood pressure 160/71 satting 95% on room air.  White count 12.8, hemoglobin 8.9, platelets of 58,000  Sodium 137, potassium 4.2, bicarb of 21, BUN of 50, creatinine 2.6  UA only showed small amount of protein.  CT urogram showed large polycystic right kidney.  There was some mild left hydroureteronephrosis and some mild periureteral stranding.  There is a 5 x 5 infrarenal abdominal aortic aneurysm previously measured at 3.9 x 3.8 cm in 2018.  Incidentally there is also noted to be a small patchy opacity in the right lower lobe suspicious for pneumonia.  [facility] hospital[facility] contacted for admission.

## 2022-12-14 NOTE — Assessment & Plan Note (Addendum)
Observation med/surg bed. Continue with IVF. Hold nephrotoxic agents. Repeat BMP in AM.

## 2022-12-15 DIAGNOSIS — E860 Dehydration: Secondary | ICD-10-CM | POA: Diagnosis present

## 2022-12-15 DIAGNOSIS — N1832 Chronic kidney disease, stage 3b: Secondary | ICD-10-CM | POA: Diagnosis present

## 2022-12-15 DIAGNOSIS — C9110 Chronic lymphocytic leukemia of B-cell type not having achieved remission: Secondary | ICD-10-CM | POA: Diagnosis present

## 2022-12-15 DIAGNOSIS — I714 Abdominal aortic aneurysm, without rupture, unspecified: Secondary | ICD-10-CM | POA: Diagnosis present

## 2022-12-15 DIAGNOSIS — N134 Hydroureter: Secondary | ICD-10-CM | POA: Diagnosis present

## 2022-12-15 DIAGNOSIS — R34 Anuria and oliguria: Secondary | ICD-10-CM | POA: Diagnosis not present

## 2022-12-15 DIAGNOSIS — I7143 Infrarenal abdominal aortic aneurysm, without rupture: Secondary | ICD-10-CM | POA: Diagnosis present

## 2022-12-15 DIAGNOSIS — R338 Other retention of urine: Secondary | ICD-10-CM | POA: Diagnosis present

## 2022-12-15 DIAGNOSIS — E785 Hyperlipidemia, unspecified: Secondary | ICD-10-CM | POA: Diagnosis present

## 2022-12-15 DIAGNOSIS — R197 Diarrhea, unspecified: Secondary | ICD-10-CM | POA: Diagnosis present

## 2022-12-15 DIAGNOSIS — R339 Retention of urine, unspecified: Secondary | ICD-10-CM | POA: Diagnosis present

## 2022-12-15 DIAGNOSIS — N179 Acute kidney failure, unspecified: Secondary | ICD-10-CM | POA: Diagnosis present

## 2022-12-15 DIAGNOSIS — N1330 Unspecified hydronephrosis: Secondary | ICD-10-CM | POA: Diagnosis present

## 2022-12-15 DIAGNOSIS — D696 Thrombocytopenia, unspecified: Secondary | ICD-10-CM | POA: Diagnosis present

## 2022-12-15 DIAGNOSIS — Z794 Long term (current) use of insulin: Secondary | ICD-10-CM | POA: Diagnosis not present

## 2022-12-15 DIAGNOSIS — Z87891 Personal history of nicotine dependence: Secondary | ICD-10-CM | POA: Diagnosis not present

## 2022-12-15 DIAGNOSIS — Z79899 Other long term (current) drug therapy: Secondary | ICD-10-CM | POA: Diagnosis not present

## 2022-12-15 DIAGNOSIS — N401 Enlarged prostate with lower urinary tract symptoms: Secondary | ICD-10-CM | POA: Diagnosis present

## 2022-12-15 DIAGNOSIS — D631 Anemia in chronic kidney disease: Secondary | ICD-10-CM | POA: Diagnosis present

## 2022-12-15 DIAGNOSIS — E872 Acidosis, unspecified: Secondary | ICD-10-CM | POA: Diagnosis present

## 2022-12-15 DIAGNOSIS — Q613 Polycystic kidney, unspecified: Secondary | ICD-10-CM | POA: Diagnosis not present

## 2022-12-15 DIAGNOSIS — I129 Hypertensive chronic kidney disease with stage 1 through stage 4 chronic kidney disease, or unspecified chronic kidney disease: Secondary | ICD-10-CM | POA: Diagnosis present

## 2022-12-15 DIAGNOSIS — E1122 Type 2 diabetes mellitus with diabetic chronic kidney disease: Secondary | ICD-10-CM | POA: Diagnosis present

## 2022-12-15 DIAGNOSIS — F419 Anxiety disorder, unspecified: Secondary | ICD-10-CM | POA: Diagnosis present

## 2022-12-15 DIAGNOSIS — J189 Pneumonia, unspecified organism: Secondary | ICD-10-CM | POA: Diagnosis present

## 2022-12-15 DIAGNOSIS — E669 Obesity, unspecified: Secondary | ICD-10-CM | POA: Diagnosis present

## 2022-12-15 LAB — COMPREHENSIVE METABOLIC PANEL
ALT: 11 U/L (ref 0–44)
AST: 15 U/L (ref 15–41)
Albumin: 2.8 g/dL — ABNORMAL LOW (ref 3.5–5.0)
Alkaline Phosphatase: 91 U/L (ref 38–126)
Anion gap: 7 (ref 5–15)
BUN: 50 mg/dL — ABNORMAL HIGH (ref 8–23)
CO2: 17 mmol/L — ABNORMAL LOW (ref 22–32)
Calcium: 8.2 mg/dL — ABNORMAL LOW (ref 8.9–10.3)
Chloride: 116 mmol/L — ABNORMAL HIGH (ref 98–111)
Creatinine, Ser: 2.43 mg/dL — ABNORMAL HIGH (ref 0.61–1.24)
GFR, Estimated: 25 mL/min — ABNORMAL LOW (ref >60)
Glucose, Bld: 83 mg/dL (ref 70–99)
Potassium: 4.1 mmol/L (ref 3.5–5.1)
Sodium: 140 mmol/L (ref 135–145)
Total Bilirubin: 0.5 mg/dL (ref 0.3–1.2)
Total Protein: 4.6 g/dL — ABNORMAL LOW (ref 6.5–8.1)

## 2022-12-15 LAB — GASTROINTESTINAL PANEL BY PCR, STOOL (REPLACES STOOL CULTURE)

## 2022-12-15 LAB — CBC WITH DIFFERENTIAL/PLATELET
Abs Immature Granulocytes: 0.02 10*3/uL (ref 0.00–0.07)
Basophils Absolute: 0 10*3/uL (ref 0.0–0.1)
Basophils Relative: 0 %
Eosinophils Absolute: 0.1 10*3/uL (ref 0.0–0.5)
Eosinophils Relative: 1 %
HCT: 26.8 % — ABNORMAL LOW (ref 39.0–52.0)
Hemoglobin: 8.5 g/dL — ABNORMAL LOW (ref 13.0–17.0)
Immature Granulocytes: 0 %
Lymphocytes Relative: 74 %
Lymphs Abs: 9.6 10*3/uL — ABNORMAL HIGH (ref 0.7–4.0)
MCH: 32.2 pg (ref 26.0–34.0)
MCHC: 31.7 g/dL (ref 30.0–36.0)
MCV: 101.5 fL — ABNORMAL HIGH (ref 80.0–100.0)
Monocytes Absolute: 1 10*3/uL (ref 0.1–1.0)
Monocytes Relative: 8 %
Neutro Abs: 2.2 10*3/uL (ref 1.7–7.7)
Neutrophils Relative %: 17 %
Platelets: 57 10*3/uL — ABNORMAL LOW (ref 150–400)
RBC: 2.64 MIL/uL — ABNORMAL LOW (ref 4.22–5.81)
RDW: 14.9 % (ref 11.5–15.5)
Smear Review: NORMAL
WBC: 13 10*3/uL — ABNORMAL HIGH (ref 4.0–10.5)
nRBC: 0 % (ref 0.0–0.2)

## 2022-12-15 LAB — GLUCOSE, CAPILLARY
Glucose-Capillary: 81 mg/dL (ref 70–99)
Glucose-Capillary: 126 mg/dL — ABNORMAL HIGH (ref 70–99)
Glucose-Capillary: 103 mg/dL — ABNORMAL HIGH (ref 70–99)
Glucose-Capillary: 124 mg/dL — ABNORMAL HIGH (ref 70–99)
Glucose-Capillary: 113 mg/dL — ABNORMAL HIGH (ref 70–99)

## 2022-12-15 LAB — HEMOGLOBIN A1C
Hgb A1c MFr Bld: 6.3 % — ABNORMAL HIGH (ref 4.8–5.6)
Mean Plasma Glucose: 134 mg/dL

## 2022-12-15 LAB — PROCALCITONIN
Procalcitonin: <0.1 ng/mL
Procalcitonin: <0.1 ng/mL

## 2022-12-15 LAB — FOLATE: Folate: 20.6 ng/mL (ref >5.9)

## 2022-12-15 LAB — VITAMIN D 25 HYDROXY (VIT D DEFICIENCY, FRACTURES): Vit D, 25-Hydroxy: 69.1 ng/mL (ref 30–100)

## 2022-12-15 LAB — IRON AND TIBC
Iron: 67 ug/dL (ref 45–182)
Saturation Ratios: 27 % (ref 17.9–39.5)
TIBC: 251 ug/dL (ref 250–450)
UIBC: 184 ug/dL

## 2022-12-15 LAB — MAGNESIUM: Magnesium: 1.8 mg/dL (ref 1.7–2.4)

## 2022-12-15 LAB — PHOSPHORUS: Phosphorus: 4 mg/dL (ref 2.5–4.6)

## 2022-12-15 LAB — C DIFFICILE QUICK SCREEN W PCR REFLEX
C Diff antigen: NEGATIVE
C Diff interpretation: NOT DETECTED
C Diff toxin: NEGATIVE

## 2022-12-15 MED ORDER — INSULIN GLARGINE-YFGN 100 UNIT/ML ~~LOC~~ SOLN
20.0000 [IU] | Freq: Every day | SUBCUTANEOUS | Status: DC
  Filled 2022-12-15: qty 0.2

## 2022-12-15 MED ORDER — ACETAMINOPHEN 650 MG RE SUPP: 650.0000 mg | Freq: Four times a day (QID) | RECTAL | Status: DC | PRN

## 2022-12-15 MED ORDER — ONDANSETRON HCL 4 MG PO TABS: 4.0000 mg | ORAL_TABLET | Freq: Four times a day (QID) | ORAL | Status: DC | PRN

## 2022-12-15 MED ORDER — FINASTERIDE 5 MG PO TABS
5.0000 mg | ORAL_TABLET | Freq: Every day | ORAL | Status: DC
  Administered 2022-12-15 – 2022-12-19 (×5): 5 mg via ORAL
  Filled 2022-12-15 (×5): qty 1

## 2022-12-15 MED ORDER — HYDRALAZINE HCL 20 MG/ML IJ SOLN
10.0000 mg | Freq: Four times a day (QID) | INTRAMUSCULAR | Status: DC | PRN
  Administered 2022-12-19: 10 mg via INTRAVENOUS
  Filled 2022-12-15: qty 1

## 2022-12-15 MED ORDER — SODIUM CHLORIDE 0.45 % IV SOLN
INTRAVENOUS | Status: DC
  Filled 2022-12-15 (×9): qty 75

## 2022-12-15 MED ORDER — LORAZEPAM 1 MG PO TABS: 1.0000 mg | ORAL_TABLET | Freq: Three times a day (TID) | ORAL | Status: DC | PRN

## 2022-12-15 MED ORDER — AZITHROMYCIN 500 MG PO TABS
500.0000 mg | ORAL_TABLET | Freq: Every day | ORAL | Status: AC
  Administered 2022-12-15 – 2022-12-18 (×4): 500 mg via ORAL
  Filled 2022-12-15 (×4): qty 1

## 2022-12-15 MED ORDER — TAMSULOSIN HCL 0.4 MG PO CAPS
0.4000 mg | ORAL_CAPSULE | Freq: Every day | ORAL | Status: DC
  Administered 2022-12-15 – 2022-12-18 (×4): 0.4 mg via ORAL
  Filled 2022-12-15 (×4): qty 1

## 2022-12-15 MED ORDER — SACCHAROMYCES BOULARDII 250 MG PO CAPS
250.0000 mg | ORAL_CAPSULE | Freq: Two times a day (BID) | ORAL | Status: DC
  Administered 2022-12-15 – 2022-12-19 (×9): 250 mg via ORAL
  Filled 2022-12-15 (×9): qty 1

## 2022-12-15 MED ORDER — CLONAZEPAM 0.5 MG PO TABS
1.0000 mg | ORAL_TABLET | Freq: Every day | ORAL | Status: DC
  Administered 2022-12-15 – 2022-12-18 (×5): 1 mg via ORAL
  Filled 2022-12-15 (×5): qty 2

## 2022-12-15 MED ORDER — ACETAMINOPHEN 325 MG PO TABS: 650.0000 mg | ORAL_TABLET | Freq: Four times a day (QID) | ORAL | Status: DC | PRN

## 2022-12-15 MED ORDER — GLUCERNA SHAKE PO LIQD
237.0000 mL | Freq: Three times a day (TID) | ORAL | Status: DC
  Administered 2022-12-15 – 2022-12-19 (×8): 237 mL via ORAL

## 2022-12-15 MED ORDER — HYDRALAZINE HCL 50 MG PO TABS
50.0000 mg | ORAL_TABLET | Freq: Four times a day (QID) | ORAL | Status: DC | PRN
  Administered 2022-12-18: 50 mg via ORAL
  Filled 2022-12-15: qty 1

## 2022-12-15 MED ORDER — INSULIN ASPART 100 UNIT/ML IJ SOLN
0.0000 [IU] | Freq: Three times a day (TID) | INTRAMUSCULAR | Status: DC
  Administered 2022-12-15: 1 [IU] via SUBCUTANEOUS
  Administered 2022-12-16: 3 [IU] via SUBCUTANEOUS
  Administered 2022-12-16 (×2): 2 [IU] via SUBCUTANEOUS
  Administered 2022-12-17: 3 [IU] via SUBCUTANEOUS
  Administered 2022-12-17: 2 [IU] via SUBCUTANEOUS
  Administered 2022-12-18: 3 [IU] via SUBCUTANEOUS
  Administered 2022-12-18 – 2022-12-19 (×2): 2 [IU] via SUBCUTANEOUS
  Filled 2022-12-15 (×9): qty 1

## 2022-12-15 MED ORDER — SODIUM CHLORIDE 0.9 % IV SOLN
2.0000 g | INTRAVENOUS | Status: AC
  Administered 2022-12-15 – 2022-12-18 (×4): 2 g via INTRAVENOUS
  Filled 2022-12-15: qty 2
  Filled 2022-12-15 (×3): qty 20

## 2022-12-15 MED ORDER — INSULIN GLARGINE-YFGN 100 UNIT/ML ~~LOC~~ SOLN
15.0000 [IU] | Freq: Every day | SUBCUTANEOUS | Status: DC
  Administered 2022-12-15 – 2022-12-19 (×5): 15 [IU] via SUBCUTANEOUS
  Filled 2022-12-15 (×5): qty 0.15

## 2022-12-15 MED ORDER — ONDANSETRON HCL 4 MG/2ML IJ SOLN: 4.0000 mg | Freq: Four times a day (QID) | INTRAMUSCULAR | Status: DC | PRN

## 2022-12-15 MED ORDER — CARVEDILOL 6.25 MG PO TABS
12.5000 mg | ORAL_TABLET | Freq: Two times a day (BID) | ORAL | Status: DC
  Administered 2022-12-15 – 2022-12-19 (×9): 12.5 mg via ORAL
  Filled 2022-12-15 (×9): qty 2

## 2022-12-15 MED ORDER — ATORVASTATIN CALCIUM 20 MG PO TABS
10.0000 mg | ORAL_TABLET | Freq: Every day | ORAL | Status: DC
  Administered 2022-12-15 – 2022-12-18 (×4): 10 mg via ORAL
  Filled 2022-12-15 (×4): qty 1

## 2022-12-15 NOTE — Consult Note (Signed)
Urology Consult   I have been asked to see the patient by Dr. Dwyane Dee, for evaluation and management of mild left hydronephrosis in the setting of a solitary left kidney.  Chief Complaint: Weakness, reported decreased urine output  HPI:  Patrick Prey. is a 89 y.o. male with functionally solitary left kidney/polycystic right kidney, CKD with baseline creatinine ~2.5-2.75, eGFR 25-30 and BPH who I have followed long-term as an outpatient.  He is on maximal medical therapy with Flomax and finasteride.  He was admitted yesterday with primary symptoms of weakness, cough, and reported decreased urine output.  He was started on antibiotics for possible pneumonia 1 week ago by PCP, and is also been on cough suppressants.  He also feels he may be dehydrated.  He was able to void 300 mL without issue in the ER last night, with a normal postvoid residual of 67m.  He continues to void yellow urine today.  He has noticed a very small amount of debris in the urine overnight, which is not uncommon for him in the setting of his polycystic right kidney.  He denies any flank pain or gross hematuria, or difficulty urinating.  Evaluation in the ER yesterday showed creatinine similar to baseline with 2.61, EGFR 23, completely benign urinalysis, mild leukocytosis of 13k which really has been unchanged over the last year.  Renal function this morning creatinine 2.43, EGFR 25.  He also had a CT scan that showed stable polycystic right kidney, very mild left-sided hydronephrosis with some mild dilation of the ureter and no evidence of stones, as well as right lower lobe pneumonia.  PMH: Past Medical History:  Diagnosis Date   Anxiety    CKD (chronic kidney disease), stage III (HCC)    CLL (chronic lymphocytic leukemia) (HCC)    Diabetes mellitus [place] complication ([place]    Diverticulitis 04/09/2017   History of kidney stones    HLD (hyperlipidemia)    HTN (hypertension)    PKD (polycystic kidney  disease)    Sleep apnea    Uncontrolled type 2 diabetes mellitus with hyperglycemia ([place] 06/28/2018    Surgical History: Past Surgical History:  Procedure Laterality Date   CARDIAC CATHETERIZATION  12/1987   CENTRAL LINE INSERTION N/A 04/11/2017   Procedure: Central Line Insertion;  Surgeon: SKatha Cabal MD;  [facility];  Service: Cardiovascular;  Laterality: N/A;   CERVICAL FUSION     CHOLECYSTECTOMY     CYSTOSCOPY W/ RETROGRADES Left 05/12/2017   Procedure: CYSTOSCOPY WITH RETROGRADE PYELOGRAM;  Surgeon: BNickie Retort MD;  Location: [facility];  Service: Urology;  Laterality: Left;   CYSTOSCOPY W/ URETERAL STENT PLACEMENT Left 05/12/2017   Procedure: CYSTOSCOPY WITH STENT REMOVAL;  Surgeon: BNickie Retort MD;  Location: [facility];  Service: Urology;  Laterality: Left;   CYSTOSCOPY WITH STENT PLACEMENT Left 04/11/2017   Procedure: CYSTOSCOPY WITH STENT PLACEMENT;  Surgeon: EFestus Aloe MD;  Location: [facility];  Service: Urology;  Laterality: Left;   URETEROSCOPY  05/12/2017   Procedure: URETEROSCOPY;  Surgeon: BNickie Retort MD;  Location: [facility];  Service: Urology;;     Allergies:  Allergies  Allergen Reactions   Celecoxib Other (See Comments)    Increased Blood Pressure   Chlorpromazine Nausea Only   Prednisone Other (See Comments)    Diabetic   Amoxicillin-Pot Clavulanate Rash    Has patient had a PCN reaction causing immediate rash, facial/tongue/throat swelling, SOB or lightheadedness with hypotension: No Has  patient had a PCN reaction causing severe rash involving mucus membranes or skin necrosis: No Has patient had a PCN reaction that required hospitalization: No Has patient had a PCN reaction occurring within the last 10 years: Yes If all of the above answers are "NO", then may proceed with Cephalosporin use.    Penicillin V Potassium Rash    Family History: Family History  Problem Relation Age of Onset   Cancer Mother     Varicose Veins Mother    Cancer Father     Social History:  reports that he has quit smoking. He has been exposed to tobacco smoke. He has never used smokeless tobacco. He reports that he does not drink alcohol and does not use drugs.  ROS: Negative aside from those stated in the HPI.  Physical Exam: BP (!) 146/58 (BP Location: Right Arm)   Pulse 73   Temp 98.2 F (36.8 C)   Resp 16   Ht 6' (1.829 m)   Wt 84.4 kg   SpO2 98%   BMI 25.24 kg/m    Constitutional:  Alert and oriented, No acute distress.  Laying in bed comfortably Cardiovascular: No clubbing, cyanosis, or edema. Respiratory: Normal respiratory effort, no increased work of breathing. GI: Abdomen is soft, nontender, nondistended, no abdominal masses No CVA tenderness   Laboratory Data: Reviewed, see HPI  Pertinent Imaging: I have personally reviewed the CT stone protocol showing minimal left-sided hydronephrosis with some dilation of the ureter, no evidence of stones, decompressed bladder.  Assessment & Plan:   89 year old male with complex urologic history including polycystic right kidney with functionally solitary left kidney, CKD with baseline creatinine approximately 2.6, EGFR 23, recently diagnosed with pneumonia as outpatient by PCP and started on antibiotics and cough suppressants who presented to the ER yesterday with weakness and decreased urine output.  Emptying appropriately with normal PVR, renal function at baseline, and improved this morning.  Denies any urinary complaints.  CT showing some possible very mild left-sided hydronephrosis and ureteral dilation, but no evidence of ureteral stones, and bladder nondistended.  Urinalysis benign.  With no flank pain or symptoms of obstruction, renal function at baseline, benign urinalysis, no indication for ureteral stenting from a urology perspective.  Suspect his decreased urine output was combination of dehydration in the setting of pneumonia as well as  potentially cough suppressants causing worsening of his BPH baseline symptoms, but emptying appropriately last night and today, no indication for Foley catheter.  Would continue his home Flomax and finasteride.  Recommendations: -Agree with antibiotics for suspected pneumonia -Avoid cough suppressants/Sudafed that could exacerbate BPH symptoms -No indication for left ureteral stent or Foley catheter -Urology will continue to monitor renal function while hospitalized, but do not anticipate any need for intervention   [name], MD  Total time spent on the floor was 65 minutes, with greater than 50% spent in counseling and coordination of care with the patient regarding CT findings, exacerbated urinary symptoms in the setting of cough suppressants for pneumonia, baseline renal function, complete bladder emptying, normal urinalysis, and recommendations for observation.  [facility] [address], [place], [place] [address] [phone]

## 2022-12-15 NOTE — Progress Notes (Signed)
[facility] Progress Note  Patient: Patrick Payne.    [id number]  DOA: 12/14/2022     Date of Service: the patient was seen and examined on 12/15/2022  Chief Complaint  Patient presents with   Urinary Retention   Brief hospital course: 89 year old male history of CKD stage IIIb, history of polycystic kidneys on the right, type 2 diabetes on insulin, hypertension, hyperlipidemia, history of CLL, history anxiety, presents to the ER today with complaints of decreased urine output. Patient was seen by his primary care doctor on 12/06/2022 due to complaints of cough. At that time he had complained of at least 5 days of coughing. Patient states that he had an x-ray performed but no one can seem to find the results of the x-ray. He was started on doxycycline along with some Zofran. He states the doxycycline made him extremely nauseous but he had to take Zofran for it. He has been on doxycycline for the last 7 days. He has been eating very little. He has been drinking fluids. Has noticed over the last 24 hours, that he is making less urine. This concerned him and he brought himself to the ER. He denies any fever or chills. He developed acute diarrhea today. He does not have any abdominal pain.   ED workup: WBC 12.8, Hb 8.9, platelet 58 K thrombocytopenia Creatinine 2.6, UA small amount of proteins, not impressive for UTI. CT urogram showed large polycystic right kidney.  There was some mild left hydroureteronephrosis and some mild periureteral stranding.  There is a 5 x 5 infrarenal abdominal aortic aneurysm previously measured at 3.9 x 3.8 cm in 2018. Incidentally there is also noted to be a small patchy opacity in the right lower lobe suspicious for pneumonia. [facility] contacted for admission.    Assessment and Plan:  # AKI on stage 3b chronic kidney disease Patient follows nephrology as an outpatient S/p IVF. Hold nephrotoxic agents.  Metabolic acidosis, bicarb 17 Started  bicarb IV infusion Repeat BMP in AM.  Hydroureter on left and pyelonephritis, fat stranding CT a/p No stone seen on CT. Unclear the etiology of the hydroureter. Continue ceftriaxone 2 g IV daily UA not very impressive for UTI, WBC 13 slightly elevated, procalcitonin 0.1 most likely reactive leukocytosis and history of CLL Follow urine culture Urology consulted, recommended no indication for left ureteral stent or Foley catheter.   Right lower lobe pneumonia Start IV rocephin/po zithromax. RLL pneumonia is small and likely incidental. Has been on doxycycline for 7 days.  Acute diarrhea Diarrhea started on 3/6 after being on doxycycline for 7 days.  C.Diff and GI viral panel negative 3/7 soft stools a day, no diarrhea   Thrombocytopenia (HCC) Chronic. SCDs for DVT prophylaxis.   Polycystic kidney disease - right side Chronic. Followed by nephrology and urology as outpatient.   HLD (hyperlipidemia) Stable. Continue lipitor 10 mg qhs.   Essential hypertension Stable. Continue coreg. Hold ARB due to ARF.   Type 2 diabetes mellitus with chronic kidney disease, with long-term current use of insulin ([place]) 3/7 developed hypoglycemia, decrease to Semglee 15 units subcu daily, continue NovoLog sliding scale and monitor CBG. Continue diabetic diet Follow A1c  Chronic lymphocytic leukemia, Stable.    Abdominal aortic aneurysm (AAA) (HCC) Now 5 cm x 5 cm on CT abd. Was 3.9 cm x 3.8 cm in 2018. Patient follows vascular surgery Dr. Lucky Cowboy, recommended to follow in 1 to 2 weeks for further recommendation.   Body mass index is 25.24  kg/m.  Interventions:      Diet: Carb modified diet DVT Prophylaxis: SCD due to thrombocytopenia   Advance goals of care discussion: Full code  Family Communication: family was present at bedside, at the time of interview.  The pt provided permission to discuss medical plan with the family. Opportunity was given to ask question and all questions were  answered satisfactorily.   Disposition:  Pt is from Home, admitted with AKI, left hydro & pyelo and PNA, still has AKI on IV bicarb gtt, and IV Abx which precludes a safe discharge. Discharge to Home, when clinically stable, may need few days to improve..  Subjective: No significant events overnight, patient denies any diarrhea at this time, no abdominal pain, no nausea vomiting.  Denies any chest pain or palpitation, no shortness of breath.  Patient is having mild cough no significant symptoms of pneumonia.  Denies any other active issues. Patient's daughter was at bedside, management plan discussed.  Patient is feeling weak and started using walker recently.   Physical Exam: General: NAD, lying comfortably Appear in no distress, affect appropriate Eyes: PERRLA ENT: Oral Mucosa Clear, moist  Neck: no JVD,  Cardiovascular: S1 and S2 Present, no Murmur,  Respiratory: good respiratory effort, Bilateral Air entry equal and Decreased, no Crackles, no wheezes Abdomen: Bowel Sound present, Soft and no tenderness,  Skin: no rashes Extremities: no Pedal edema, no calf tenderness Neurologic: without any new focal findings Gait not checked due to patient safety concerns  Vitals:   12/14/22 2215 12/14/22 2343 12/14/22 2358 12/15/22 0730  BP: (!) 178/64 (!) 164/64  (!) 146/58  Pulse: 68 60  73  Resp: '19 16  16  '$ Temp: 98.4 F (36.9 C) 98.5 F (36.9 C)  98.2 F (36.8 C)  TempSrc: Oral Oral    SpO2: 95% 96%  98%  Weight:   84.4 kg   Height:   6' (1.829 m)     Intake/Output Summary (Last 24 hours) at 12/15/2022 1158 Last data filed at 12/15/2022 0830 Gross per 24 hour  Intake --  Output 1000 ml  Net -1000 ml   Filed Weights   12/14/22 2358  Weight: 84.4 kg    Data Reviewed: I have personally reviewed and interpreted daily labs, tele strips, imagings as discussed above. I reviewed all nursing notes, pharmacy notes, vitals, pertinent old records I have discussed plan of care as  described above with RN and patient/family.  CBC: Recent Labs  Lab 12/12/22 1328 12/14/22 1612 12/15/22 0348  WBC 14.7* 12.8* 13.0*  NEUTROABS 3.0  --  2.2  HGB 9.2* 8.9* 8.5*  HCT 29.5* 28.4* 26.8*  MCV 103.1* 103.3* 101.5*  PLT 57* 58* 57*   Basic Metabolic Panel: Recent Labs  Lab 12/14/22 1612 12/15/22 0346 12/15/22 0348  NA 137  --  140  K 4.2  --  4.1  CL 109  --  116*  CO2 21*  --  17*  GLUCOSE 131*  --  83  BUN 50*  --  50*  CREATININE 2.61*  --  2.43*  CALCIUM 8.4*  --  8.2*  MG  --   --  1.8  PHOS  --  4.0  --     Studies: CT Renal Stone Study  Result Date: 12/14/2022 CLINICAL DATA:  Abdominal pain EXAM: CT ABDOMEN AND PELVIS WITHOUT CONTRAST TECHNIQUE: Multidetector CT imaging of the abdomen and pelvis was performed following the standard protocol without IV contrast. RADIATION DOSE REDUCTION: This exam was performed  according to the departmental dose-optimization program which includes automated exposure control, adjustment of the mA and/or kV according to patient size and/or use of iterative reconstruction technique. COMPARISON:  CT abdomen/pelvis dated 04/09/2017. Multiple renal ultrasounds, most recently 05/23/2022. FINDINGS: Lower chest: Patchy opacity with peribronchovascular nodularity in the right lower lobe, suspicious for pneumonia. Hepatobiliary: Unenhanced liver is unremarkable. Status post cholecystectomy. No intrahepatic or extrahepatic dilatation. Pancreas: Within normal limits. Spleen: Within normal limits. Adrenals/Urinary Tract: Adrenal glands are within normal limits. Left kidney is notable for a left upper pole renal sinus cyst and an 18 mm simple exophytic cyst along the left lower kidney (series 2/image 41), benign. No follow-up is recommended. Mild left hydroureteronephrosis, new. Mild periureteral stranding (series 2/image 39) raises the possibility of infection. 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 No hydronephrosis. Bladder is underdistended but unremarkable. Stomach/Bowel: Stomach is within normal limits. No evidence of bowel obstruction. Normal appendix (series 2/image 53). Mild left colonic diverticulosis, without evidence of diverticulitis. Vascular/Lymphatic: 5.0 x 5.0 cm infrarenal abdominal aortic aneurysm, previously 3.9 x 3.8 cm in 2018. Atherosclerotic calcifications of the abdominal aorta and branch vessels. No suspicious abdominopelvic lymphadenopathy. Reproductive: Prostate is unremarkable. Other: No abdominopelvic ascites. Small fat containing midline supraumbilical hernia (series 2/image 44). Musculoskeletal: Degenerative changes of the visualized thoracolumbar spine. IMPRESSION: Mild left hydroureteronephrosis, new. Associated mild periureteral stranding raises the possibility of infection. Patchy opacity with peribronchovascular nodularity in the right lower lobe, suspicious for pneumonia. 5.0 x 5.0 cm infrarenal abdominal aortic aneurysm, previously 3.9 x 3.8 cm in 2018. Recommend follow-up every 6 months and vascular consultation. Reference: J Am Coll Radiol [id number]. Additional stable ancillary findings as above. Electronically Signed   By: Julian Hy M.D.   On: 12/14/2022 19:42    Scheduled Meds:  atorvastatin  10 mg Oral QHS   azithromycin  500 mg Oral Daily   carvedilol  12.5 mg Oral BID WC   clonazePAM  1 mg Oral QHS   finasteride  5 mg Oral Daily   insulin aspart  0-15 Units Subcutaneous TID WC   insulin glargine-yfgn  15 Units Subcutaneous Daily   saccharomyces boulardii  250 mg Oral BID   tamsulosin  0.4 mg Oral QPC supper   Continuous Infusions:  cefTRIAXone (ROCEPHIN)  IV     sodium bicarbonate 75 mEq in sodium chloride 0.45 % 1,075 mL infusion 75 mL/hr at 12/15/22 1122   PRN Meds: acetaminophen **OR** acetaminophen, hydrALAZINE **OR** hydrALAZINE,  LORazepam, ondansetron **OR** ondansetron (ZOFRAN) IV  Time spent: 35 minutes  Author: Val Riles. MD [facility] 12/15/2022 11:58 AM  To reach On-call, see care teams to locate the attending and reach out to them via [email or web address]. If 7PM-7AM, please contact night-coverage If you still have difficulty reaching the attending provider, please page the [facility] (Director on Call) for [facility] on amion for assistance.

## 2022-12-15 NOTE — Plan of Care (Signed)
  Problem: Activity: Goal: Ability to tolerate increased activity will improve Outcome: Progressing   Problem: Respiratory: Goal: Ability to maintain adequate ventilation will improve Outcome: Progressing   Problem: Education: Goal: Knowledge of General Education information will improve Description: Including pain rating scale, medication(s)/side effects and non-pharmacologic comfort measures Outcome: Progressing   Problem: Activity: Goal: Risk for activity intolerance will decrease Outcome: Progressing   Problem: Coping: Goal: Level of anxiety will decrease Outcome: Progressing   Problem: Pain Managment: Goal: General experience of comfort will improve Outcome: Progressing   Problem: Safety: Goal: Ability to remain free from injury will improve Outcome: Progressing   Problem: Skin Integrity: Goal: Risk for impaired skin integrity will decrease Outcome: Progressing

## 2022-12-15 NOTE — Evaluation (Signed)
Physical Therapy Evaluation Patient Details Name: Patrick Payne. MRN: [id number] DOB: 1934-08-01 Today's Date: 12/15/2022  History of Present Illness  Pt is an 89 y.o. male presenting to hospital 12/14/22 with c/o urinary retention, weakness, and cough.  Pt admitted with acute renal failure superimposed on stage 3b CKD, acute diarrhea, L hydroureter, R LL PNA, thrombocytopenia, polycystic kidney disease R, and AAA (5 cm x 5 cm on CT abdomen).  PMH includes PCOS with one functioning kidney, CKD,  DM, htn, HLD, h/o CLL, h/o anxiety, cervical fusion.  Clinical Impression  Prior to hospital admission, pt was modified independent ambulating (using SPC for about the past year but in last week using 4ww); lives with his wife on main level of home with 3 STE B railings.  Currently pt is modified independent with bed mobility; CGA with transfers using RW; and CGA to ambulate 190 feet with RW use.  Pt noted with generalized weakness but steady with mobility using RW.  Family voiced concerns regarding pt preferring to sit in recliner and not mobilize when at home: pt educated on importance of mobility and how to schedule walking more into his day: pt verbalizing appropriate understanding.  Pt would currently benefit from skilled PT to address noted impairments and functional limitations (see below for any additional details).  Upon hospital discharge, pt would benefit from ongoing therapy.    Recommendations for follow up therapy are one component of a multi-disciplinary discharge planning process, led by the attending physician.  Recommendations may be updated based on patient status, additional functional criteria and insurance authorization.  Follow Up Recommendations Home health PT      Assistance Recommended at Discharge Intermittent Supervision/Assistance  Patient can return home with the following  A little help with walking and/or transfers;A little help with bathing/dressing/bathroom;Assistance  with cooking/housework;Assist for transportation;Help with stairs or ramp for entrance    Equipment Recommendations Rolling walker (2 wheels)  Recommendations for Other Services       Functional Status Assessment Patient has had a recent decline in their functional status and demonstrates the ability to make significant improvements in function in a reasonable and predictable amount of time.     Precautions / Restrictions Precautions Precautions: Fall Restrictions Weight Bearing Restrictions: No      Mobility  Bed Mobility Overal bed mobility: Modified Independent             General bed mobility comments: Semi-supine to/from sitting with HOB elevated (mild increased effort to perform on own).    Transfers Overall transfer level: Needs assistance Equipment used: Rolling walker (2 wheels) Transfers: Sit to/from Stand Sit to Stand: Min guard           General transfer comment: mild increased effort to stand from bed up to RW; vc's to keep walker close when preparing to sit down on bed    Ambulation/Gait Ambulation/Gait assistance: Min guard Gait Distance (Feet): 190 Feet Assistive device: Rolling walker (2 wheels) Gait Pattern/deviations: Step-through pattern Gait velocity: decreased     General Gait Details: steady ambulation with RW use  Stairs            Wheelchair Mobility    Modified Rankin (Stroke Patients Only)       Balance Overall balance assessment: Needs assistance Sitting-balance support: No upper extremity supported, Feet supported Sitting balance-Leahy Scale: Good Sitting balance - Comments: steady sitting reaching within BOS   Standing balance support: Bilateral upper extremity supported, During functional activity, Reliant on assistive device for  balance Standing balance-Leahy Scale: Good Standing balance comment: steady ambulating with RW use                             Pertinent Vitals/Pain Pain Assessment Pain  Assessment: No/denies pain Vitals (HR and O2 on room air) stable and WFL throughout treatment session.    Home Living Family/patient expects to be discharged to:: Private residence Living Arrangements: Spouse/significant other Available Help at Discharge: Family;Available PRN/intermittently Type of Home: House Home Access: Stairs to enter Entrance Stairs-Rails: Right;Left;Can reach both Entrance Stairs-Number of Steps: 3   Home Layout: Two level;Able to live on main level with bedroom/bathroom Home Equipment: Grab bars - tub/shower;Shower seat - built in;Grab bars - toilet;Rollator (4 wheels);Cane - single point Additional Comments: Pt has very tall bed at home (has step stool to get in) but can sleep in recliner if needed (therapist discussed with pt/family regarding getting a lower bed height set-up to make getting in/out of bed easier--pt has family support that can help with this).    Prior Function Prior Level of Function : Independent/Modified Independent             Mobility Comments: Pt has been ambulating with SPC for about the past year but has started using 4ww in last week d/t weakness.  No recent falls.       Hand Dominance        Extremity/Trunk Assessment   Upper Extremity Assessment Upper Extremity Assessment: Overall WFL for tasks assessed    Lower Extremity Assessment Lower Extremity Assessment: Generalized weakness    Cervical / Trunk Assessment Cervical / Trunk Assessment: Normal  Communication   Communication: No difficulties  Cognition Arousal/Alertness: Awake/alert Behavior During Therapy: WFL for tasks assessed/performed Overall Cognitive Status: Within Functional Limits for tasks assessed                                          General Comments  Nursing cleared pt for participation in physical therapy.  Pt agreeable to PT session.  Pt's wife and daughter present during session.    Exercises  Encouraged pt to ambulate  3x's/day (using RW) with nursing assist while in hospital.   Assessment/Plan    PT Assessment Patient needs continued PT services  PT Problem List Decreased strength;Decreased activity tolerance;Decreased balance;Decreased mobility       PT Treatment Interventions DME instruction;Gait training;Stair training;Functional mobility training;Therapeutic activities;Therapeutic exercise;Balance training;Patient/family education    PT Goals (Current goals can be found in the Care Plan section)  Acute Rehab PT Goals Patient Stated Goal: to improve strength and mobility PT Goal Formulation: With patient/family Time For Goal Achievement: 12/29/22 Potential to Achieve Goals: Good    Frequency Min 2X/week     Co-evaluation               AM-PAC PT "6 Clicks" Mobility  Outcome Measure Help needed turning from your back to your side while in a flat bed without using bedrails?: None Help needed moving from lying on your back to sitting on the side of a flat bed without using bedrails?: None Help needed moving to and from a bed to a chair (including a wheelchair)?: A Little Help needed standing up from a chair using your arms (e.g., wheelchair or bedside chair)?: A Little Help needed to walk in hospital room?: A Little Help  needed climbing 3-5 steps with a railing? : A Little 6 Click Score: 20    End of Session Equipment Utilized During Treatment: Gait belt Activity Tolerance: Patient tolerated treatment well Patient left: in bed;with call bell/phone within reach;with bed alarm set;with family/visitor present Nurse Communication: Mobility status;Precautions PT Visit Diagnosis: Other abnormalities of gait and mobility (R26.89);Muscle weakness (generalized) (M62.81)    Time: [id number] PT Time Calculation (min) (ACUTE ONLY): 25 min   Charges:   PT Evaluation $PT Eval Low Complexity: 1 Low PT Treatments $Therapeutic Activity: 8-22 mins       Leitha Bleak, PT 12/15/22, 5:41  PM

## 2022-12-15 NOTE — Plan of Care (Signed)
  Problem: Activity: Goal: Ability to tolerate increased activity will improve Outcome: Progressing   Problem: Clinical Measurements: Goal: Ability to maintain a body temperature in the normal range will improve Outcome: Progressing   Problem: Respiratory: Goal: Ability to maintain adequate ventilation will improve Outcome: Progressing Goal: Ability to maintain a clear airway will improve Outcome: Progressing   Problem: Education: Goal: Knowledge of General Education information will improve Description: Including pain rating scale, medication(s)/side effects and non-pharmacologic comfort measures Outcome: Progressing   Problem: Health Behavior/Discharge Planning: Goal: Ability to manage health-related needs will improve Outcome: Progressing   Problem: Clinical Measurements: Goal: Ability to maintain clinical measurements within normal limits will improve Outcome: Progressing Goal: Will remain free from infection Outcome: Progressing Goal: Diagnostic test results will improve Outcome: Progressing Goal: Respiratory complications will improve Outcome: Progressing Goal: Cardiovascular complication will be avoided Outcome: Progressing   Problem: Activity: Goal: Risk for activity intolerance will decrease Outcome: Progressing   Problem: Nutrition: Goal: Adequate nutrition will be maintained Outcome: Progressing   Problem: Coping: Goal: Level of anxiety will decrease Outcome: Progressing   Problem: Elimination: Goal: Will not experience complications related to bowel motility Outcome: Progressing Goal: Will not experience complications related to urinary retention Outcome: Progressing   Problem: Pain Managment: Goal: General experience of comfort will improve Outcome: Progressing   Problem: Safety: Goal: Ability to remain free from injury will improve Outcome: Progressing   Problem: Skin Integrity: Goal: Risk for impaired skin integrity will decrease Outcome:  Progressing   Problem: Education: Goal: Ability to describe self-care measures that may prevent or decrease complications (Diabetes Survival Skills Education) will improve Outcome: Progressing Goal: Individualized Educational Video(s) Outcome: Progressing   Problem: Coping: Goal: Ability to adjust to condition or change in health will improve Outcome: Progressing   Problem: Fluid Volume: Goal: Ability to maintain a balanced intake and output will improve Outcome: Progressing   Problem: Health Behavior/Discharge Planning: Goal: Ability to identify and utilize available resources and services will improve Outcome: Progressing Goal: Ability to manage health-related needs will improve Outcome: Progressing   Problem: Metabolic: Goal: Ability to maintain appropriate glucose levels will improve Outcome: Progressing   Problem: Nutritional: Goal: Maintenance of adequate nutrition will improve Outcome: Progressing Goal: Progress toward achieving an optimal weight will improve Outcome: Progressing   Problem: Skin Integrity: Goal: Risk for impaired skin integrity will decrease Outcome: Progressing   Problem: Tissue Perfusion: Goal: Adequacy of tissue perfusion will improve Outcome: Progressing   

## 2022-12-15 NOTE — Inpatient Diabetes Management (Signed)
Inpatient Diabetes Program Recommendations  AACE/ADA: New Consensus Statement on Inpatient Glycemic Control  Target Ranges:  Prepandial:   less than 140 mg/dL      Peak postprandial:   less than 180 mg/dL (1-2 hours)      Critically ill patients:  140 - 180 mg/dL    Latest Reference Range & Units 12/15/22 06:16 12/15/22 07:36  Glucose-Capillary 70 - 99 mg/dL 81 126 (H)    Latest Reference Range & Units 12/14/22 16:12  Glucose 70 - 99 mg/dL 131 (H)   Review of Glycemic Control  Diabetes history: DM2 Outpatient Diabetes medications: Lantus 20-30 units QAM, Jardiance 10 mg daily Current orders for Inpatient glycemic control: Semglee 20 units daily, Novolog 0-15 units TID with meals  Inpatient Diabetes Program Recommendations:    Insulin: Please consider decreasing Semglee to 15 units daily.  NOTE: Noted consult for diabetes coordinator for diabetes management. In reviewing chart, noted patient sees Dr. Gabriel Carina (Endocrinologist) for DM control and was last seen on 09/22/22. Per office note on 09/22/22, patient's A1C was 6.3% and patient was adjusting Lantus from 40-60 units based on glucose. Spoke with patient over the phone to inquire about DM medications and control. Patient states that he takes Lantus 20-30 units QAM and Jardiance 10 mg daily for DM control. Patient states that if fasting CBG is less than 120 mg/dl that he takes Lantus 20 units QAM but if over 120 mg/d then he takes Lantus 30 units. Patient reports that he took Lantus 30 units yesterday morning at home. Patient reports that his CBG in the mornings is typically 90-115 mg/dl and in the evening 100-170 mg/dl.  Patient reports that he does not have a big appetite and he typically eats a good breakfast, drinks Ensure for lunch, and eats whatever his wife cooks for supper. Patient states that he does not have hypoglycemia very often but when he does he starts to perspire. Patient states he felt like his sugar was low this morning and  when nurse checked, his glucose was 81 mg/dl so the nurse gave him some juice to drink. Patient states that he feels fine at this time. Discussed current orders for DM control and recommendation to decrease Semglee to 15 units daily. Patient verbalized understanding and has no questions or concerns at this time.   Thanks, Barnie Alderman, RN, MSN, [facility] Coordinator Inpatient Diabetes Program [phone] (Team Pager from 8am to [place])

## 2022-12-16 LAB — GLUCOSE, CAPILLARY
Glucose-Capillary: 119 mg/dL — ABNORMAL HIGH (ref 70–99)
Glucose-Capillary: 129 mg/dL — ABNORMAL HIGH (ref 70–99)
Glucose-Capillary: 190 mg/dL — ABNORMAL HIGH (ref 70–99)
Glucose-Capillary: 131 mg/dL — ABNORMAL HIGH (ref 70–99)
Glucose-Capillary: 126 mg/dL — ABNORMAL HIGH (ref 70–99)

## 2022-12-16 LAB — URINE CULTURE: Culture: NO GROWTH

## 2022-12-16 LAB — CBC
HCT: 26.5 % — ABNORMAL LOW (ref 39.0–52.0)
Hemoglobin: 8.4 g/dL — ABNORMAL LOW (ref 13.0–17.0)
MCH: 32.4 pg (ref 26.0–34.0)
MCHC: 31.7 g/dL (ref 30.0–36.0)
MCV: 102.3 fL — ABNORMAL HIGH (ref 80.0–100.0)
Platelets: 41 10*3/uL — ABNORMAL LOW (ref 150–400)
RBC: 2.59 MIL/uL — ABNORMAL LOW (ref 4.22–5.81)
RDW: 15 % (ref 11.5–15.5)
WBC: 10.4 10*3/uL (ref 4.0–10.5)
nRBC: 0 % (ref 0.0–0.2)

## 2022-12-16 LAB — MAGNESIUM: Magnesium: 1.9 mg/dL (ref 1.7–2.4)

## 2022-12-16 LAB — BASIC METABOLIC PANEL
Anion gap: 7 (ref 5–15)
BUN: 42 mg/dL — ABNORMAL HIGH (ref 8–23)
CO2: 20 mmol/L — ABNORMAL LOW (ref 22–32)
Calcium: 8 mg/dL — ABNORMAL LOW (ref 8.9–10.3)
Chloride: 111 mmol/L (ref 98–111)
Creatinine, Ser: 2.35 mg/dL — ABNORMAL HIGH (ref 0.61–1.24)
GFR, Estimated: 26 mL/min — ABNORMAL LOW (ref >60)
Glucose, Bld: 113 mg/dL — ABNORMAL HIGH (ref 70–99)
Potassium: 4.2 mmol/L (ref 3.5–5.1)
Sodium: 138 mmol/L (ref 135–145)

## 2022-12-16 LAB — PHOSPHORUS: Phosphorus: 3.7 mg/dL (ref 2.5–4.6)

## 2022-12-16 MED ORDER — ADULT MULTIVITAMIN W/MINERALS CH
1.0000 | ORAL_TABLET | Freq: Every day | ORAL | Status: DC
  Administered 2022-12-16 – 2022-12-19 (×4): 1 via ORAL
  Filled 2022-12-16 (×4): qty 1

## 2022-12-16 NOTE — Progress Notes (Signed)
Mobility Specialist - Progress Note    12/16/22 1600  Mobility  Activity Ambulated with assistance in hallway;Stood at bedside;Dangled on edge of bed  Level of Assistance Standby assist, set-up cues, supervision of patient - no hands on  Assistive Device Front wheel walker  Distance Ambulated (ft) 160 ft  Activity Response Tolerated well  Mobility Referral Yes  $Mobility charge 1 Mobility   Pt resting in bed on RA upon entry. Pt STS and ambulates to hallway SBA with AD. Pt returned to bed and left with needs in reach and bed alarm activated. Family present in room.   Loma Sender Mobility Specialist 12/16/22, 4:03 PM

## 2022-12-16 NOTE — Progress Notes (Signed)
Initial Nutrition Assessment  DOCUMENTATION CODES:   Not applicable  INTERVENTION:   -Liberalize diet to carb modified for wider variety of meal selections -MVI with minerals daily -Glucerna Shake po TID, each supplement provides 220 kcal and 10 grams of protein   NUTRITION DIAGNOSIS:   Inadequate oral intake related to decreased appetite as evidenced by per patient/family report.  GOAL:   Patient will meet greater than or equal to 90% of their needs  MONITOR:   PO intake, Supplement acceptance  REASON FOR ASSESSMENT:   Malnutrition Screening Tool    ASSESSMENT:   Pt with history of CKD stage IIIb, history of polycystic kidneys on the right, type 2 diabetes on insulin, hypertension, hyperlipidemia, history of CLL, history anxiety, presents with complaints of decreased urine output.  Pt admitted with ARF superimposed on stage 3 CKD and rt lower lobe pneumonia. Pt with polycystic kidney disease of rt kidney.   Reviewed I/O's: -144 ml x 24 hours and -944 ml since admission  UOP: 1.2 L x 24 hours   Urology has evaluated pt; no need for catheter for lt uretal stent.   Spoke with pt at bedside, who was pleasant and in good spirits today. He reports decreased appetite, often not having the desire to eat. He reports "I feel like I just ate a meal and then it is time for another one". Typical intake at home consists of 3 meals per day (Breakfast: blueberry muffin and juice; Lunch: Glucerna shake; Dinner: bowl of soup). Pt recently started drinking Glucerna shakes a few weeks ago for additional nutrition. Pt still with cravings for chocolate, which he consumes 4 Reese's cups at bedtime. He prides himself on his good blood sugar control and values his visits with his outpatient endocrinologist (Dr. Gabriel Carina).   Noted pt consumed 100% of breakfast (Blueberry muffin, oatmeal, and juice).   Reviewed wt hx; pt has experienced a 13% wt loss over the past 6 months. Per pt, his UBW is around  200#, which he last weighed about 1 year ago. He has noticed a more recent decline in wt over the past 1-3 months. Pt reports that he keeps detailed daily records of his blood pressure, blood sugar, and weight.   Discussed importance of good meal and supplement intake to promote healing. Pt amenable to continue Glucerna supplements here and at home.   Medixations reviewed and include florastor and sodium bicarbonate.   Lab Results  Component Value Date   HGBA1C 6.3 (H) 12/15/2022   PTA DM medications are 20-30 units insulin glargine daily and 10 mg jardiance daily.   Labs reviewed: CNGS: 113-129 (inpatient orders for glycemic control are 0-15 units insulin aspart TID with meals and 15 units insulin glargine-yfgn daily).    NUTRITION - FOCUSED PHYSICAL EXAM:  Flowsheet Row Most Recent Value  Orbital Region No depletion  Upper Arm Region Mild depletion  Thoracic and Lumbar Region No depletion  Buccal Region No depletion  Temple Region No depletion  Clavicle Bone Region No depletion  Clavicle and Acromion Bone Region No depletion  Scapular Bone Region No depletion  Dorsal Hand No depletion  Patellar Region Mild depletion  Anterior Thigh Region Mild depletion  Posterior Calf Region Mild depletion  Edema (RD Assessment) Mild  Hair Reviewed  Eyes Reviewed  Mouth Reviewed  Skin Reviewed  Nails Reviewed       Diet Order:   Diet Order             Diet Carb Modified Fluid  consistency: Thin  Diet effective now                   EDUCATION NEEDS:   Education needs have been addressed  Skin:  Skin Assessment: Reviewed RN Assessment  Last BM:  12/14/22  Height:   Ht Readings from Last 1 Encounters:  12/14/22 6' (1.829 m)    Weight:   Wt Readings from Last 1 Encounters:  12/14/22 84.4 kg    Ideal Body Weight:  80.9 kg  BMI:  Body mass index is 25.24 kg/m.  Estimated Nutritional Needs:   Kcal:  [id number]  Protein:  90-105 grams  Fluid:  > 1.7  L    Loistine Chance, RD, LDN, [place] Registered Dietitian II Certified Diabetes Care and Education Specialist Please refer to [facility] for RD and/or RD on-call/weekend/after hours pager

## 2022-12-16 NOTE — Evaluation (Signed)
Occupational Therapy Evaluation Patient Details Name: Patrick Payne. MRN: [id number] DOB: [birth year] Today's Date: 12/16/2022   History of Present Illness Pt is an 89 y.o. male presenting to hospital 12/14/22 with c/o urinary retention, weakness, and cough.  Pt admitted with acute renal failure superimposed on stage 3b CKD, acute diarrhea, L hydroureter, R LL PNA, thrombocytopenia, polycystic kidney disease R, and AAA (5 cm x 5 cm on CT abdomen).  PMH includes PCOS with one functioning kidney, CKD,  DM, htn, HLD, h/o CLL, h/o anxiety, cervical fusion.   Clinical Impression   Patrick Payne was seen for OT evaluation this date. Prior to hospital admission, pt was MOD I using SPC. Pt lives with spouse. Pt presents to acute OT demonstrating impaired ADL performance and functional mobility 2/2 decreased activity tolerance. Pt currently requires SBA no AD use for toilet t/f and picking items up from ankle height cabinet. Improves to SUPERVISION + RW for functional mobility ~200 ft. Educated on ECS and falls prevention. No skilled acute OT needs identified, will sign off. Upon hospital discharge, recommend no OT follow up.   Recommendations for follow up therapy are one component of a multi-disciplinary discharge planning process, led by the attending physician.  Recommendations may be updated based on patient status, additional functional criteria and insurance authorization.   Follow Up Recommendations  No OT follow up     Assistance Recommended at Discharge Set up Supervision/Assistance  Patient can return home with the following Help with stairs or ramp for entrance    Functional Status Assessment  Patient has had a recent decline in their functional status and demonstrates the ability to make significant improvements in function in a reasonable and predictable amount of time.  Equipment Recommendations  None recommended by OT    Recommendations for Other Services       Precautions /  Restrictions Precautions Precautions: Fall Restrictions Weight Bearing Restrictions: No      Mobility Bed Mobility Overal bed mobility: Independent                  Transfers Overall transfer level: Needs assistance Equipment used: None Transfers: Sit to/from Stand Sit to Stand: Supervision                  Balance Overall balance assessment: Needs assistance Sitting-balance support: No upper extremity supported, Feet supported Sitting balance-Leahy Scale: Good     Standing balance support: No upper extremity supported, During functional activity Standing balance-Leahy Scale: Good                             ADL either performed or assessed with clinical judgement   ADL Overall ADL's : Needs assistance/impaired                                       General ADL Comments: MOD I for LB access seated EOB. SBA no AD use for toilet t/f and picking items up from ankle height cabinet. Improves to SUPERVISION + RW for functional mobility ~200 ft.      Pertinent Vitals/Pain Pain Assessment Pain Assessment: No/denies pain     Hand Dominance     Extremity/Trunk Assessment Upper Extremity Assessment Upper Extremity Assessment: Overall WFL for tasks assessed   Lower Extremity Assessment Lower Extremity Assessment: Generalized weakness       Communication Communication Communication:  No difficulties   Cognition Arousal/Alertness: Awake/alert Behavior During Therapy: WFL for tasks assessed/performed Overall Cognitive Status: Within Functional Limits for tasks assessed                                                  Home Living Family/patient expects to be discharged to:: Private residence Living Arrangements: Spouse/significant other Available Help at Discharge: Family;Available PRN/intermittently Type of Home: House Home Access: Stairs to enter [employer] of Steps: 3 Entrance Stairs-Rails:  Right;Left;Can reach both Home Layout: Two level;Able to live on main level with bedroom/bathroom     Bathroom Shower/Tub: Occupational psychologist: Handicapped height     Home Equipment: Rulo;Shower seat - built in;Grab bars - toilet;Rollator (4 wheels);Cane - single point          Prior Functioning/Environment Prior Level of Function : Independent/Modified Independent             Mobility Comments: Pt has been ambulating with SPC for about the past year but has started using 4ww in last week d/t weakness.  No recent falls.          OT Problem List: Decreased strength;Decreased activity tolerance         OT Goals(Current goals can be found in the care plan section) Acute Rehab OT Goals Patient Stated Goal: to go home OT Goal Formulation: With patient Time For Goal Achievement: 12/30/22 Potential to Achieve Goals: Good   AM-PAC OT "6 Clicks" Daily Activity     Outcome Measure Help from another person eating meals?: None Help from another person taking care of personal grooming?: None Help from another person toileting, which includes using toliet, bedpan, or urinal?: A Little Help from another person bathing (including washing, rinsing, drying)?: A Little Help from another person to put on and taking off regular upper body clothing?: None Help from another person to put on and taking off regular lower body clothing?: None 6 Click Score: 22   End of Session Equipment Utilized During Treatment: Rolling walker (2 wheels)  Activity Tolerance: Patient tolerated treatment well Patient left: in bed;with call bell/phone within reach  OT Visit Diagnosis: Unsteadiness on feet (R26.81);Muscle weakness (generalized) (M62.81)                Time: [id number] OT Time Calculation (min): 13 min Charges:  OT General Charges $OT Visit: 1 Visit OT Evaluation $OT Eval Low Complexity: 1 Low  Patrick Payne, M.S. OTR/L  12/16/22, 10:48 AM  ascom  [phone]

## 2022-12-16 NOTE — TOC Progression Note (Signed)
Transition of Care (TOC) - Progression Note    Patient Details  Name: Patrick Payne. MRN: [id number] Date of Birth: 06-15-1934  Transition of Care [facility]) CM/SW Contact  Gerilyn Pilgrim, LCSW Phone Number: 12/16/2022, 1:14 PM  Clinical Narrative:   CSW spoke with patients daughter [name] in regards to [facility]. [name] states that pt would be interested in this. Daughter states pt is still active with Dr. Doy Hutching. Pt has a rollator at home and daughter is going to order a RW since insurance will not cover a RW since it was ordered in February. [name] states pt does not drive but his wife transports to appts. Pt is still seeing Dr. Doy Hutching for primary care. No additional needs at this time. CSW will work on finding [facility] for patient.          Expected Discharge Plan and Services                                               Social Determinants of Health (SDOH) Interventions SDOH Screenings   Food Insecurity: No Food Insecurity (12/15/2022)  Housing: Low Risk  (12/15/2022)  Transportation Needs: No Transportation Needs (12/15/2022)  Utilities: Not At Risk (12/15/2022)  Tobacco Use: Medium Risk (12/14/2022)    Readmission Risk Interventions     No data to display

## 2022-12-16 NOTE — Progress Notes (Signed)
Physical Therapy Treatment Patient Details Name: Patrick Payne. MRN: [id number] DOB: Dec 22, 1933 Today's Date: 12/16/2022   History of Present Illness Pt is an 89 y.o. male presenting to hospital 12/14/22 with c/o urinary retention, weakness, and cough.  Pt admitted with acute renal failure superimposed on stage 3b CKD, acute diarrhea, L hydroureter, R LL PNA, thrombocytopenia, polycystic kidney disease R, and AAA (5 cm x 5 cm on CT abdomen).  PMH includes PCOS with one functioning kidney, CKD,  DM, htn, HLD, h/o CLL, h/o anxiety, cervical fusion.    PT Comments    Pt resting in bed upon PT arrival; agreeable to therapy.  During session pt modified independent with bed mobility; SBA with transfers using RW; CGA progressing to SBA ambulating 250 feet with RW use; and CGA navigating 4 steps with B railings.  Pt steady with mobility using RW during session.  Will continue to focus on strengthening, balance, and progressive functional mobility during hospitalization.  Educated pt on/encouraged pt to continue walking (with RW) 3x's a day with staff assist: pt verbalizing appropriate understanding.    Recommendations for follow up therapy are one component of a multi-disciplinary discharge planning process, led by the attending physician.  Recommendations may be updated based on patient status, additional functional criteria and insurance authorization.  Follow Up Recommendations  Home health PT     Assistance Recommended at Discharge Intermittent Supervision/Assistance  Patient can return home with the following A little help with walking and/or transfers;A little help with bathing/dressing/bathroom;Assistance with cooking/housework;Assist for transportation;Help with stairs or ramp for entrance   Equipment Recommendations  Rolling walker (2 wheels)    Recommendations for Other Services       Precautions / Restrictions Precautions Precautions: Fall Restrictions Weight Bearing  Restrictions: No     Mobility  Bed Mobility Overal bed mobility: Modified Independent             General bed mobility comments: Semi-supine to/from sitting with HOB elevated (mild increased effort to perform on own).    Transfers Overall transfer level: Needs assistance Equipment used: Rolling walker (2 wheels) Transfers: Sit to/from Stand Sit to Stand: Supervision           General transfer comment: steady safe transfer from bed (mild increased effort to stand on own)    Ambulation/Gait Ambulation/Gait assistance: Min guard, Supervision Gait Distance (Feet): 250 Feet Assistive device: Rolling walker (2 wheels) Gait Pattern/deviations: Step-through pattern Gait velocity: mildly decreased     General Gait Details: steady ambulation with RW use   Stairs Stairs: Yes Stairs assistance: Min guard Stair Management: Two rails, Step to pattern, Forwards Number of Stairs: 4 General stair comments: steady safe stairs navigation   Wheelchair Mobility    Modified Rankin (Stroke Patients Only)       Balance Overall balance assessment: Needs assistance Sitting-balance support: No upper extremity supported, Feet supported Sitting balance-Leahy Scale: Good Sitting balance - Comments: steady sitting reaching within BOS   Standing balance support: Bilateral upper extremity supported, During functional activity, Reliant on assistive device for balance Standing balance-Leahy Scale: Good Standing balance comment: steady ambulating with RW use                            Cognition Arousal/Alertness: Awake/alert Behavior During Therapy: WFL for tasks assessed/performed Overall Cognitive Status: Within Functional Limits for tasks assessed  Exercises      General Comments  Nursing cleared pt for participation in physical therapy.  Pt agreeable to PT session.      Pertinent Vitals/Pain Pain  Assessment Pain Assessment: No/denies pain Vitals (HR and O2 on room air) stable and WFL throughout treatment session.    Home Living                          Prior Function            PT Goals (current goals can now be found in the care plan section) Acute Rehab PT Goals Patient Stated Goal: to improve strength and mobility PT Goal Formulation: With patient/family Time For Goal Achievement: 12/29/22 Potential to Achieve Goals: Good Progress towards PT goals: Progressing toward goals    Frequency    Min 2X/week      PT Plan Current plan remains appropriate    Co-evaluation              AM-PAC PT "6 Clicks" Mobility   Outcome Measure  Help needed turning from your back to your side while in a flat bed without using bedrails?: None Help needed moving from lying on your back to sitting on the side of a flat bed without using bedrails?: None Help needed moving to and from a bed to a chair (including a wheelchair)?: A Little Help needed standing up from a chair using your arms (e.g., wheelchair or bedside chair)?: A Little Help needed to walk in hospital room?: A Little Help needed climbing 3-5 steps with a railing? : A Little 6 Click Score: 20    End of Session Equipment Utilized During Treatment: Gait belt Activity Tolerance: Patient tolerated treatment well Patient left: in bed;with call bell/phone within reach;with bed alarm set;with family/visitor present Nurse Communication: Mobility status;Precautions PT Visit Diagnosis: Other abnormalities of gait and mobility (R26.89);Muscle weakness (generalized) (M62.81)     Time: [id number] PT Time Calculation (min) (ACUTE ONLY): 18 min  Charges:  $Gait Training: 8-22 mins                     Leitha Bleak, PT 12/16/22, 4:27 PM

## 2022-12-16 NOTE — Progress Notes (Signed)
  Progress Note   Patient: Patrick Payne. [id number] DOB: 1934-08-05 DOA: 12/14/2022     1 DOS: the patient was seen and examined on 12/16/2022   Brief hospital course:  Assessment and Plan: * Acute renal failure superimposed on stage 3b chronic kidney disease ([place]) - Kidney function slightly improved  - IV sodium bicarb 75 meq in 1/2 NS @ 75 cc/hr   Acute diarrhea - Improving per pt   Hydroureter on left - Appreciate urology consult - Finasteride 5 mg PO daily   Right lower lobe pneumonia - Azithromycin 500 mg PO daily  - IV ceftriaxone 2 g daily  - Florastor 250 mg PO bid   Thrombocytopenia (HCC) - Chronic, monitor   Polycystic kidney disease - right side - Chronic. Outpt f/u w/ nephrology and urology   HLD (hyperlipidemia) - Lipitor 10 mg PO daily   Essential hypertension - Coreg 12.5 mg PO bid   Type 2 diabetes mellitus with chronic kidney disease, with long-term current use of insulin (HCC) - Novolog tid w meals  - Glargine 15 units sq daily  - Glucerna  tid   Chronic lymphocytic leukemia (HCC) - Stable  Chronic kidney disease, stage 3b (HCC) - baseline SCr 2.2-2.5 - IV fluids as above   Abdominal aortic aneurysm (AAA) (HCC) - Now 5 cm x 5 cm on CT abd. Was 3.9 cm x 3.8 cm in 2018.  DVT prophylaxis: SCDs ordered      Subjective: Pt seen and examined at the bedside. He reports some loose stools today. WBC is improved 13 -->10. He continues on antibx's and IV fluids. Monitoring for improvement in his kidney function.   Physical Exam: Vitals:   12/15/22 1653 12/15/22 1939 12/16/22 0408 12/16/22 0749  BP: (!) 154/71 (!) 135/59 (!) 157/53 (!) 169/67  Pulse: 72 72 62 66  Resp: 18 20 18 20   Temp: 98.9 F (37.2 C) 98 F (36.7 C) 98.2 F (36.8 C) 97.8 F (36.6 C)  TempSrc: Oral Oral Oral   SpO2: 97% 98% 97% 95%  Weight:      Height:       Physical Exam Constitutional:      Appearance: Normal appearance.  HENT:     Head: Normocephalic and  atraumatic.     Mouth/Throat:     Mouth: Mucous membranes are moist.  Cardiovascular:     Rate and Rhythm: Normal rate and regular rhythm.  Pulmonary:     Effort: Pulmonary effort is normal.  Abdominal:     Palpations: Abdomen is soft.  Musculoskeletal:        General: Normal range of motion.     Cervical back: Neck supple.  Skin:    General: Skin is warm.  Neurological:     Mental Status: He is alert. Mental status is at baseline.  Psychiatric:        Mood and Affect: Mood normal.     Data Reviewed:   Disposition: Status is: Inpatient  Planned Discharge Destination: Home    Time spent: 35 minutes  Author: Lucienne Minks , MD 12/16/2022 9:58 AM  For on call review [email or web address].

## 2022-12-17 LAB — BASIC METABOLIC PANEL
Anion gap: 7 (ref 5–15)
BUN: 34 mg/dL — ABNORMAL HIGH (ref 8–23)
CO2: 23 mmol/L (ref 22–32)
Calcium: 7.9 mg/dL — ABNORMAL LOW (ref 8.9–10.3)
Chloride: 110 mmol/L (ref 98–111)
Creatinine, Ser: 2.19 mg/dL — ABNORMAL HIGH (ref 0.61–1.24)
GFR, Estimated: 29 mL/min — ABNORMAL LOW (ref >60)
Glucose, Bld: 95 mg/dL (ref 70–99)
Potassium: 4.1 mmol/L (ref 3.5–5.1)
Sodium: 140 mmol/L (ref 135–145)

## 2022-12-17 LAB — CBC
HCT: 26.1 % — ABNORMAL LOW (ref 39.0–52.0)
Hemoglobin: 8.1 g/dL — ABNORMAL LOW (ref 13.0–17.0)
MCH: 32.1 pg (ref 26.0–34.0)
MCHC: 31 g/dL (ref 30.0–36.0)
MCV: 103.6 fL — ABNORMAL HIGH (ref 80.0–100.0)
Platelets: 51 10*3/uL — ABNORMAL LOW (ref 150–400)
RBC: 2.52 MIL/uL — ABNORMAL LOW (ref 4.22–5.81)
RDW: 15.1 % (ref 11.5–15.5)
WBC: 12.7 10*3/uL — ABNORMAL HIGH (ref 4.0–10.5)
nRBC: 0 % (ref 0.0–0.2)

## 2022-12-17 LAB — GLUCOSE, CAPILLARY
Glucose-Capillary: 94 mg/dL (ref 70–99)
Glucose-Capillary: 130 mg/dL — ABNORMAL HIGH (ref 70–99)
Glucose-Capillary: 161 mg/dL — ABNORMAL HIGH (ref 70–99)
Glucose-Capillary: 113 mg/dL — ABNORMAL HIGH (ref 70–99)

## 2022-12-17 LAB — PHOSPHORUS: Phosphorus: 3.5 mg/dL (ref 2.5–4.6)

## 2022-12-17 LAB — MAGNESIUM: Magnesium: 1.8 mg/dL (ref 1.7–2.4)

## 2022-12-17 LAB — C-REACTIVE PROTEIN: CRP: <0.5 mg/dL (ref <1.0)

## 2022-12-17 MED ORDER — LOPERAMIDE HCL 2 MG PO CAPS
2.0000 mg | ORAL_CAPSULE | Freq: Once | ORAL | Status: AC
  Administered 2022-12-17: 2 mg via ORAL
  Filled 2022-12-17: qty 1

## 2022-12-17 MED ORDER — LOPERAMIDE HCL 2 MG PO CAPS
2.0000 mg | ORAL_CAPSULE | Freq: Once | ORAL | Status: DC
  Filled 2022-12-17: qty 1

## 2022-12-17 MED ORDER — ALUM & MAG HYDROXIDE-SIMETH 200-200-20 MG/5ML PO SUSP
30.0000 mL | Freq: Four times a day (QID) | ORAL | Status: DC | PRN
  Administered 2022-12-17: 30 mL via ORAL
  Filled 2022-12-17: qty 30

## 2022-12-17 NOTE — Progress Notes (Signed)
  Progress Note   Patient: Patrick Payne. [id number]:[id number] DOB: 1934-01-27 DOA: 12/14/2022     2 DOS: the patient was seen and examined on 12/17/2022   Brief hospital course:  Assessment and Plan: * Acute renal failure superimposed on stage 3b chronic kidney disease ([place]) - Kidney function continues to improve  - IV sodium bicarb 75 meq in 1/2 NS @ 75 cc/hr    Acute diarrhea - Improved   Hydroureter on left - Appreciate urology consult - Finasteride 5 mg PO daily    Right lower lobe pneumonia - Azithromycin 500 mg PO daily  - IV ceftriaxone 2 g daily  - Florastor 250 mg PO bid    Thrombocytopenia (HCC) - Improving    Polycystic kidney disease - right side - Chronic. Outpt f/u w/ nephrology and urology    HLD (hyperlipidemia) - Lipitor 10 mg PO daily    Essential hypertension - Coreg 12.5 mg PO bid    Type 2 diabetes mellitus with chronic kidney disease, with long-term current use of insulin (HCC) - Novolog tid w meals  - Glargine 15 units sq daily  - Glucerna  tid    Chronic lymphocytic leukemia (HCC) - Stable   Chronic kidney disease, stage 3b (HCC) - baseline SCr 2.2-2.5 - IV fluids as above    Abdominal aortic aneurysm (AAA) (HCC) - Now 5 cm x 5 cm on CT abd. Was 3.9 cm x 3.8 cm in 2018.   DVT prophylaxis: SCDs ordered       Subjective: Pt seen and examined at the bedside. Kidney function is improving.  He continues on IV fluids. He reports he had a good night sleep last night. Recheck kidney function in the AM.  Physical Exam: Vitals:   12/16/22 1950 12/17/22 0423 12/17/22 0443 12/17/22 0804  BP: (!) 146/66 (!) 149/77  (!) 165/65  Pulse: 71 69  77  Resp: 16 16  17   Temp: 98 F (36.7 C) 98.1 F (36.7 C)  98.1 F (36.7 C)  TempSrc: Oral     SpO2: 96% 95%  97%  Weight:   88.2 kg   Height:       Constitutional:      Appearance: Normal appearance.  HENT:     Head: Normocephalic and atraumatic.     Mouth/Throat:     Mouth: Mucous membranes  are moist.  Cardiovascular:     Rate and Rhythm: Normal rate and regular rhythm.  Pulmonary:     Effort: Pulmonary effort is normal.  Abdominal:     Palpations: Abdomen is soft.  Musculoskeletal:        General: Normal range of motion.     Cervical back: Neck supple.  Skin:    General: Skin is warm.  Neurological:     Mental Status: He is alert. Mental status is at baseline.  Psychiatric:        Mood and Affect: Mood normal.   Data Reviewed:   Disposition: Status is: Inpatient  Planned Discharge Destination: Home    Time spent: 35 minutes  Author: Lucienne Minks , MD 12/17/2022 9:22 AM  For on call review [email or web address].

## 2022-12-17 NOTE — Progress Notes (Signed)
Mobility Specialist - Progress Note     12/17/22 1100  Mobility  Activity Ambulated with assistance in hallway;Stood at bedside;Dangled on edge of bed  Level of Assistance Standby assist, set-up cues, supervision of patient - no hands on  Assistive Device Front wheel walker  Distance Ambulated (ft) 200 ft  Activity Response Tolerated well  Mobility Referral Yes  $Mobility charge 1 Mobility   Pt resting in bed on RA upon entry. Pt STS and ambulates to hallway around NS for 1 1/2 laps. Pt returned to recliner and left with needs in reach. Wife and son present in room.   Loma Sender Mobility Specialist 12/17/22, 1:19 PM

## 2022-12-18 LAB — BASIC METABOLIC PANEL
Anion gap: 7 (ref 5–15)
BUN: 29 mg/dL — ABNORMAL HIGH (ref 8–23)
CO2: 23 mmol/L (ref 22–32)
Calcium: 7.6 mg/dL — ABNORMAL LOW (ref 8.9–10.3)
Chloride: 110 mmol/L (ref 98–111)
Creatinine, Ser: 2.05 mg/dL — ABNORMAL HIGH (ref 0.61–1.24)
GFR, Estimated: 31 mL/min — ABNORMAL LOW (ref >60)
Glucose, Bld: 97 mg/dL (ref 70–99)
Potassium: 4.2 mmol/L (ref 3.5–5.1)
Sodium: 140 mmol/L (ref 135–145)

## 2022-12-18 LAB — CBC
HCT: 26.2 % — ABNORMAL LOW (ref 39.0–52.0)
Hemoglobin: 8.2 g/dL — ABNORMAL LOW (ref 13.0–17.0)
MCH: 32.4 pg (ref 26.0–34.0)
MCHC: 31.3 g/dL (ref 30.0–36.0)
MCV: 103.6 fL — ABNORMAL HIGH (ref 80.0–100.0)
Platelets: 46 10*3/uL — ABNORMAL LOW (ref 150–400)
RBC: 2.53 MIL/uL — ABNORMAL LOW (ref 4.22–5.81)
RDW: 15.1 % (ref 11.5–15.5)
WBC: 14 10*3/uL — ABNORMAL HIGH (ref 4.0–10.5)
nRBC: 0 % (ref 0.0–0.2)

## 2022-12-18 LAB — C-REACTIVE PROTEIN: CRP: <0.5 mg/dL (ref <1.0)

## 2022-12-18 LAB — GLUCOSE, CAPILLARY
Glucose-Capillary: 155 mg/dL — ABNORMAL HIGH (ref 70–99)
Glucose-Capillary: 121 mg/dL — ABNORMAL HIGH (ref 70–99)
Glucose-Capillary: 87 mg/dL (ref 70–99)
Glucose-Capillary: 109 mg/dL — ABNORMAL HIGH (ref 70–99)

## 2022-12-18 LAB — MAGNESIUM: Magnesium: 1.8 mg/dL (ref 1.7–2.4)

## 2022-12-18 LAB — PHOSPHORUS: Phosphorus: 3.2 mg/dL (ref 2.5–4.6)

## 2022-12-18 MED ORDER — LOPERAMIDE HCL 2 MG PO CAPS
2.0000 mg | ORAL_CAPSULE | Freq: Once | ORAL | Status: AC
  Administered 2022-12-18: 2 mg via ORAL
  Filled 2022-12-18: qty 1

## 2022-12-18 NOTE — Progress Notes (Signed)
  Progress Note   Patient: Patrick Payne. [id number] DOB: Nov 17, 1933 DOA: 12/14/2022     3 DOS: the patient was seen and examined on 12/18/2022   Brief hospital course:  Assessment and Plan: * Acute renal failure superimposed on stage 3b chronic kidney disease ([place]) - Kidney function is approaching the pt's baseline  - IV sodium bicarb 75 meq in 1/2 NS @ 75 cc/hr    Acute diarrhea - Improved, did require 1 dose of immodium on 12/17/2022   Hydroureter on left - Appreciate urology consult - Finasteride 5 mg PO daily    Right lower lobe pneumonia - Azithromycin 500 mg PO daily  - IV ceftriaxone 2 g daily  - Florastor 250 mg PO bid    Thrombocytopenia (HCC) - Monitor    Polycystic kidney disease - right side - Chronic. Outpt f/u w/ nephrology and urology    HLD (hyperlipidemia) - Lipitor 10 mg PO daily    Essential hypertension - Coreg 12.5 mg PO bid    Type 2 diabetes mellitus with chronic kidney disease, with long-term current use of insulin (HCC) - Novolog tid w meals  - Glargine 15 units sq daily  - Glucerna  tid    Chronic lymphocytic leukemia (HCC) - Stable   Chronic kidney disease, stage 3b (HCC) - baseline SCr 2.2-2.5 - IV fluids as above    Abdominal aortic aneurysm (AAA) (HCC) - Now 5 cm x 5 cm on CT abd. Was 3.9 cm x 3.8 cm in 2018.   DVT prophylaxis: SCDs ordered       Subjective: Pt seen and examined at the bedside. Kidney function is approaching his baseline. He required 1 dose of immodium yesterday for loose stools. He reports he is still not eating as he usually does. He is hopeful for discharge on Mon 12/19/2022.  Lastly, he has requested home health services upon his discharge from the hospital. Spoke with Gerilyn Pilgrim on 12/18/2022 for arranging this.   Physical Exam: Vitals:   12/17/22 1618 12/17/22 2028 12/18/22 0521 12/18/22 0752  BP: (!) 152/70 (!) 162/79 (!) 166/69 (!) 145/61  Pulse: 66 71 77 75  Resp: 16 16 18 17   Temp:  98.5 F (36.9 C) 98.7 F (37.1 C) 98.4 F (36.9 C) 98.9 F (37.2 C)  TempSrc:  Oral Oral   SpO2: 96% 97% 91% 92%  Weight:      Height:       Constitutional:      Appearance: Normal appearance.  HENT:     Head: Normocephalic and atraumatic.     Mouth/Throat:     Mouth: Mucous membranes are moist.  Cardiovascular:     Rate and Rhythm: Normal rate and regular rhythm.  Pulmonary:     Effort: Pulmonary effort is normal.  Abdominal:     Palpations: Abdomen is soft.  Musculoskeletal:        General: Normal range of motion.     Cervical back: Neck supple.  Skin:    General: Skin is warm.  Neurological:     Mental Status: He is alert. Mental status is at baseline.  Psychiatric:        Mood and Affect: Mood normal.   Data Reviewed:   Disposition: Status is: Inpatient  Planned Discharge Destination: Home    Time spent: 35 minutes  Author: Lucienne Minks , MD 12/18/2022 9:14 AM  For on call review [email or web address].

## 2022-12-18 NOTE — Progress Notes (Signed)
Physical Therapy Treatment Patient Details Name: Patrick Payne. MRN: [id number] DOB: 1934/09/04 Today's Date: 12/18/2022   History of Present Illness Pt is an 89 y.o. male presenting to hospital 12/14/22 with c/o urinary retention, weakness, and cough.  Pt admitted with acute renal failure superimposed on stage 3b CKD, acute diarrhea, L hydroureter, R LL PNA, thrombocytopenia, polycystic kidney disease R, and AAA (5 cm x 5 cm on CT abdomen).  PMH includes PCOS with one functioning kidney, CKD,  DM, htn, HLD, h/o CLL, h/o anxiety, cervical fusion.    PT Comments    Pt resting in bed upon PT arrival; agreeable to physical therapy.  Pt reports walking in hallway with staff assist today.  During session pt SBA with transfers; SBA with ambulation 250 feet with RW use; and CGA navigating 8 steps with B railings.  No c/o pain during session.  Will continue to focus on strengthening, balance, and progressive functional mobility during hospitalization.    Recommendations for follow up therapy are one component of a multi-disciplinary discharge planning process, led by the attending physician.  Recommendations may be updated based on patient status, additional functional criteria and insurance authorization.  Follow Up Recommendations  Home health PT     Assistance Recommended at Discharge Intermittent Supervision/Assistance  Patient can return home with the following A little help with walking and/or transfers;A little help with bathing/dressing/bathroom;Assistance with cooking/housework;Assist for transportation;Help with stairs or ramp for entrance   Equipment Recommendations  Rolling walker (2 wheels)    Recommendations for Other Services       Precautions / Restrictions Precautions Precautions: Fall Restrictions Weight Bearing Restrictions: No     Mobility  Bed Mobility Overal bed mobility: Modified Independent             General bed mobility comments: Semi-supine to  sitting edge of bed    Transfers Overall transfer level: Needs assistance Equipment used: Rolling walker (2 wheels) Transfers: Sit to/from Stand Sit to Stand: Supervision           General transfer comment: steady safe transfer from bed (mild increased effort to stand on own)    Ambulation/Gait Ambulation/Gait assistance: Supervision Gait Distance (Feet): 250 Feet Assistive device: Rolling walker (2 wheels) Gait Pattern/deviations: Step-through pattern Gait velocity: mildly decreased     General Gait Details: steady ambulation with RW use   Stairs Stairs: Yes Stairs assistance: Min guard Stair Management: Two rails, Step to pattern, Forwards Number of Stairs: 8 General stair comments: steady safe stairs navigation   Wheelchair Mobility    Modified Rankin (Stroke Patients Only)       Balance Overall balance assessment: Needs assistance Sitting-balance support: No upper extremity supported, Feet supported Sitting balance-Leahy Scale: Normal Sitting balance - Comments: steady sitting reaching outside BOS   Standing balance support: Bilateral upper extremity supported, During functional activity, Reliant on assistive device for balance Standing balance-Leahy Scale: Good Standing balance comment: steady ambulating with RW use                            Cognition Arousal/Alertness: Awake/alert Behavior During Therapy: WFL for tasks assessed/performed Overall Cognitive Status: Within Functional Limits for tasks assessed                                          Exercises  General Comments  Nursing cleared pt for participation in physical therapy.  Pt agreeable to PT session.  Pt's daughter present during session.      Pertinent Vitals/Pain Pain Assessment Pain Assessment: No/denies pain HR WFL during sessions activities.    Home Living                          Prior Function            PT Goals (current  goals can now be found in the care plan section) Acute Rehab PT Goals Patient Stated Goal: to improve strength and mobility PT Goal Formulation: With patient/family Time For Goal Achievement: 12/29/22 Potential to Achieve Goals: Good Progress towards PT goals: Progressing toward goals    Frequency    Min 2X/week      PT Plan Current plan remains appropriate    Co-evaluation              AM-PAC PT "6 Clicks" Mobility   Outcome Measure  Help needed turning from your back to your side while in a flat bed without using bedrails?: None Help needed moving from lying on your back to sitting on the side of a flat bed without using bedrails?: None Help needed moving to and from a bed to a chair (including a wheelchair)?: A Little Help needed standing up from a chair using your arms (e.g., wheelchair or bedside chair)?: A Little Help needed to walk in hospital room?: A Little Help needed climbing 3-5 steps with a railing? : A Little 6 Click Score: 20    End of Session Equipment Utilized During Treatment: Gait belt Activity Tolerance: Patient tolerated treatment well Patient left: with call bell/phone within reach;with bed alarm set;with family/visitor present (sitting on edge of bed with pt's daughter present) Nurse Communication: Mobility status;Precautions PT Visit Diagnosis: Other abnormalities of gait and mobility (R26.89);Muscle weakness (generalized) (M62.81)     Time: [id number] PT Time Calculation (min) (ACUTE ONLY): 14 min  Charges:  $Gait Training: 8-22 mins                     Leitha Bleak, PT 12/18/22, 4:46 PM

## 2022-12-18 NOTE — Progress Notes (Signed)
Mobility Specialist - Progress Note   12/18/22 1142  Mobility  Activity Ambulated with assistance in room  Level of Assistance Standby assist, set-up cues, supervision of patient - no hands on  Assistive Device Front wheel walker  Distance Ambulated (ft) 10 ft  Activity Response Tolerated well  Mobility Referral Yes  $Mobility charge 1 Mobility   Author responds to alarm. Pt OOB in bathroom on RA upon arrival. Pt ambulates from bathroom to bed SBA with no LOB noted. Pt returns to EOB with needs in reach.   Gretchen Short  Mobility Specialist  12/18/22 11:45 AM

## 2022-12-18 NOTE — Plan of Care (Signed)
  Problem: Activity: Goal: Ability to tolerate increased activity will improve Outcome: Progressing   Problem: Clinical Measurements: Goal: Ability to maintain a body temperature in the normal range will improve Outcome: Progressing   Problem: Respiratory: Goal: Ability to maintain adequate ventilation will improve Outcome: Progressing   Problem: Education: Goal: Knowledge of General Education information will improve Description: Including pain rating scale, medication(s)/side effects and non-pharmacologic comfort measures Outcome: Progressing   Problem: Health Behavior/Discharge Planning: Goal: Ability to manage health-related needs will improve Outcome: Progressing   Problem: Nutrition: Goal: Adequate nutrition will be maintained Outcome: Progressing   Problem: Coping: Goal: Level of anxiety will decrease Outcome: Progressing   Problem: Elimination: Goal: Will not experience complications related to bowel motility Outcome: Progressing   Problem: Pain Managment: Goal: General experience of comfort will improve Outcome: Progressing   Problem: Safety: Goal: Ability to remain free from injury will improve Outcome: Progressing   Problem: Skin Integrity: Goal: Risk for impaired skin integrity will decrease Outcome: Progressing

## 2022-12-19 ENCOUNTER — Telehealth (INDEPENDENT_AMBULATORY_CARE_PROVIDER_SITE_OTHER): Payer: Self-pay

## 2022-12-19 LAB — CBC
HCT: 24.6 % — ABNORMAL LOW (ref 39.0–52.0)
Hemoglobin: 7.7 g/dL — ABNORMAL LOW (ref 13.0–17.0)
MCH: 32.4 pg (ref 26.0–34.0)
MCHC: 31.3 g/dL (ref 30.0–36.0)
MCV: 103.4 fL — ABNORMAL HIGH (ref 80.0–100.0)
Platelets: 39 10*3/uL — ABNORMAL LOW (ref 150–400)
RBC: 2.38 MIL/uL — ABNORMAL LOW (ref 4.22–5.81)
RDW: 15.4 % (ref 11.5–15.5)
WBC: 13.9 10*3/uL — ABNORMAL HIGH (ref 4.0–10.5)
nRBC: 0 % (ref 0.0–0.2)

## 2022-12-19 LAB — BASIC METABOLIC PANEL
Anion gap: 6 (ref 5–15)
BUN: 26 mg/dL — ABNORMAL HIGH (ref 8–23)
CO2: 24 mmol/L (ref 22–32)
Calcium: 7.2 mg/dL — ABNORMAL LOW (ref 8.9–10.3)
Chloride: 106 mmol/L (ref 98–111)
Creatinine, Ser: 2.04 mg/dL — ABNORMAL HIGH (ref 0.61–1.24)
GFR, Estimated: 31 mL/min — ABNORMAL LOW (ref >60)
Glucose, Bld: 80 mg/dL (ref 70–99)
Potassium: 3.7 mmol/L (ref 3.5–5.1)
Sodium: 136 mmol/L (ref 135–145)

## 2022-12-19 LAB — GLUCOSE, CAPILLARY
Glucose-Capillary: 78 mg/dL (ref 70–99)
Glucose-Capillary: 122 mg/dL — ABNORMAL HIGH (ref 70–99)

## 2022-12-19 NOTE — TOC Progression Note (Signed)
Transition of Care (TOC) - Progression Note    Patient Details  Name: Patrick Payne. MRN: [id number] Date of Birth: [birth year]  Transition [facility]) CM/SW Contact  Gerilyn Pilgrim, LCSW Phone Number: 12/19/2022, 11:49 AM  Clinical Narrative:  CSW spoke with Mortimer Fries at [facility] who states they can accept pt for PT/OT HH.          Expected Discharge Plan and Services                                               Social Determinants of Health (SDOH) Interventions SDOH Screenings   Food Insecurity: No Food Insecurity (12/15/2022)  Housing: Low Risk  (12/15/2022)  Transportation Needs: No Transportation Needs (12/15/2022)  Utilities: Not At Risk (12/15/2022)  Tobacco Use: Medium Risk (12/14/2022)    Readmission Risk Interventions     No data to display

## 2022-12-19 NOTE — Discharge Summary (Signed)
Patrick Payne. [id number] DOB: Feb 12, 1934 DOA: 12/14/2022  PCP: Idelle Crouch, MD  Admit date: 12/14/2022 Discharge date: 12/19/2022  Time spent: 35 minutes  Recommendations for Outpatient Follow-up:  Pcp, oncology, and nephrology f/u C[facility] and bmp in 1 week  Vascular surgery f/u for AAA   Discharge Diagnoses:  Principal Problem:   Acute renal failure superimposed on stage 3b chronic kidney disease ([name]) Active Problems:   Abdominal aortic aneurysm (AAA) ([place])   Chronic kidney disease, stage 3b ([name]) - baseline SCr 2.2-2.5   Chronic lymphocytic leukemia ([name])   Type 2 diabetes mellitus with chronic kidney disease, with long-term current use of insulin ([name])   Essential hypertension   HLD (hyperlipidemia)   Polycystic kidney disease - right side   Anemia in chronic kidney disease   Community acquired pneumonia of right lower lobe of lung   Hydroureter on left   Acute diarrhea   Thrombocytopenia ([name])   Discharge Condition: improved  Diet recommendation: heart healthy  Filed Weights   12/14/22 2358 12/17/22 0443  Weight: 84.4 kg 88.2 kg    History of present illness:  From admission h and p 89 year old male history of CKD stage IIIb, history of polycystic kidneys on the right, type 2 diabetes on insulin, hypertension, hyperlipidemia, history of CLL, history anxiety, presents to the ER today with complaints of decreased urine output.  Patient was seen by his primary care doctor on 12/06/2022 due to complaints of cough.  At that time he had complained of at least 5 days of coughing.  Patient states that he had an x-ray performed but no one can seem to find the results of the x-ray.  He was started on doxycycline along with some Zofran.  He states the doxycycline made him extremely nauseous but he had to take Zofran for it.  He has been on doxycycline for the last 7 days.  He has been eating very little.  He has been drinking fluids.  Has noticed over the last 24 hours,  that he is making less urine.  This concerned him and he brought himself to the ER.  He denies any fever or chills.  He developed acute diarrhea today.  He does not have any abdominal pain.   On arrival temp 97.6 heart rate 63 blood pressure 160/71 satting 95% on room air.   White count 12.8, hemoglobin 8.9, platelets of 58,000   Sodium 137, potassium 4.2, bicarb of 21, BUN of 50, creatinine 2.6   UA only showed small amount of protein.   CT urogram showed large polycystic right kidney.  There was some mild left hydroureteronephrosis and some mild periureteral stranding.  There is a 5 x 5 infrarenal abdominal aortic aneurysm previously measured at 3.9 x 3.8 cm in 2018.   Incidentally there is also noted to be a small patchy opacity in the right lower lobe suspicious for pneumonia.  Hospital Course:   Patient presented with AKI on ckd stage 3b ckd with metabolic acidosis. This in setting of reduced po at home and came in reporting he hadn't urinated. CT showing left hydroureter and possible signs of pyelonephritis though urine culture neg and clinically no signs uti. Patient able to spontaneously void here and urology was consulted who advised no intervention. He is now urinating well. Metabolic acidosis resolved with fluids and he is tolerating by mouth, will need check of kidney function and bicarb in one week, may need institution of sodium bicarb supplementation as outpatient if acidosis reappears.  More than anything we discussed the importance of oral intake as it appears his avoidance of fluids precipitated this problem. He was diagnosed with pneumonia as outpatient and had been prescribed doxycycline. Here he was treated with a 5 day course of ceftriaxone. He reports his respiratory symptoms had been mild and at this point are resolved, is breathing comfortably on room air. His hbg did down trend from baseline in the 9s to 7s here, no bleeding, think this is at least partially diultional,  advised outpt f/u with hematology as scheduled in one month, he receives retacrit as an outpatient. CT scan does show enlargement of known AAA, vascular advises close f/u, patient and wife are aware.   Procedures:  None  Consultations: urology  Discharge Exam: Vitals:   12/19/22 0751 12/19/22 0820  BP: (!) 154/69 (!) 176/77  Pulse: 79 (!) 107  Resp: 16   Temp: 98.5 F (36.9 C)   SpO2: 95%     General: NAD Cardiovascular: RRR Respiratory: CTAB Abdomen: soft  Discharge Instructions   Discharge Instructions     Diet - low sodium heart healthy   Complete by: As directed    Increase activity slowly   Complete by: As directed       Allergies as of 12/19/2022       Reactions   Celecoxib Other (See Comments)   Increased Blood Pressure   Chlorpromazine Nausea Only   Prednisone Other (See Comments)   Diabetic   Amoxicillin-pot Clavulanate Rash   Has patient had a PCN reaction causing immediate rash, facial/tongue/throat swelling, SOB or lightheadedness with hypotension: No Has patient had a PCN reaction causing severe rash involving mucus membranes or skin necrosis: No Has patient had a PCN reaction that required hospitalization: No Has patient had a PCN reaction occurring within the last 10 years: Yes If all of the above answers are "NO", then may proceed with Cephalosporin use.   Penicillin V Potassium Rash        Medication List     TAKE these medications    Aflibercept 2 MG/0.05ML Soln Inject 1 Dose into the eye as directed. One injection into left eye every 8-9 weeks   allopurinol 100 MG tablet Commonly known as: ZYLOPRIM Take 100 mg by mouth daily.   atorvastatin 10 MG tablet Commonly known as: LIPITOR Take 10 mg by mouth at bedtime.   BD Pen Needle Nano 2nd Gen 32G X 4 MM Misc Generic drug: Insulin Pen Needle USE AS DIRECTED ONCE A DAY   carvedilol 12.5 MG tablet Commonly known as: COREG Take 12.5 mg by mouth 2 (two) times daily with a meal.    clonazePAM 1 MG tablet Commonly known as: KLONOPIN Take 1 mg by mouth at bedtime.   Contour Next Test test strip Generic drug: glucose blood daily.   cyanocobalamin 500 MCG tablet Commonly known as: VITAMIN B12 Take 1,000 mcg by mouth at bedtime.   empagliflozin 10 MG Tabs tablet Commonly known as: JARDIANCE 10 mg daily.   fexofenadine 180 MG tablet Commonly known as: ALLEGRA Take 180 mg by mouth daily as needed for allergies.   finasteride 5 MG tablet Commonly known as: PROSCAR Take 1 tablet (5 mg total) by mouth daily.   folic acid A999333 MCG tablet Commonly known as: FOLVITE Take 400 mcg by mouth daily.   insulin glargine 100 UNIT/ML injection Commonly known as: LANTUS Inject 20-30 Units into the skin every morning. Takes Lantus 20 units if CBG less than 120 mg/dl or  Lantus 30 units if CBG is over 120 mg/dl   LORazepam 1 MG tablet Commonly known as: ATIVAN Take 1 mg by mouth every 8 (eight) hours as needed for anxiety (takes 1 tablet every morning.  Rarely has to take again during the day).   omega-3 acid ethyl esters 1 g capsule Commonly known as: LOVAZA Take by mouth 2 (two) times daily.   PRESERVISION AREDS 2 PO Take 1 capsule by mouth 2 (two) times daily.   Retacrit 40000 UNIT/ML injection Generic drug: epoetin alfa-epbx Inject 40,000 Units into the skin every 6 (six) weeks. Depending on lab results (if needed)   tamsulosin 0.4 MG Caps capsule Commonly known as: FLOMAX Take 1 capsule (0.4 mg total) by mouth daily after supper.   telmisartan 40 MG tablet Commonly known as: MICARDIS Take 40 mg by mouth daily.   triamcinolone 55 MCG/ACT Aero nasal inhaler Commonly known as: NASACORT Place 2 sprays into the nose 2 (two) times daily as needed (allergies).   Vitamin D-3 125 MCG (5000 UT) Tabs Take 5,000 Units by mouth at bedtime.       Allergies  Allergen Reactions   Celecoxib Other (See Comments)    Increased Blood Pressure   Chlorpromazine Nausea  Only   Prednisone Other (See Comments)    Diabetic   Amoxicillin-Pot Clavulanate Rash    Has patient had a PCN reaction causing immediate rash, facial/tongue/throat swelling, SOB or lightheadedness with hypotension: No Has patient had a PCN reaction causing severe rash involving mucus membranes or skin necrosis: No Has patient had a PCN reaction that required hospitalization: No Has patient had a PCN reaction occurring within the last 10 years: Yes If all of the above answers are "NO", then may proceed with Cephalosporin use.    Penicillin V Potassium Rash    Follow-up Information     Lloyd Huger, MD Follow up.   Specialty: Oncology Why: as scheduled Contact information: [place] [address] [phone]         Idelle Crouch, MD Follow up.   Specialty: Internal Medicine Why: 1-2 weeks Contact information: [address] [place] [address] [phone]         Lavonia Dana, MD Follow up.   Specialty: Nephrology Contact information: [place] [address] [phone]                  The results of significant diagnostics from this hospitalization (including imaging, microbiology, ancillary and laboratory) are listed below for reference.    Significant Diagnostic Studies: CT Renal Stone Study  Result Date: 12/14/2022 CLINICAL DATA:  Abdominal pain EXAM: CT ABDOMEN AND PELVIS WITHOUT CONTRAST TECHNIQUE: Multidetector CT imaging of the abdomen and pelvis was performed following the standard protocol without IV contrast. RADIATION DOSE REDUCTION: This exam was performed according to the departmental dose-optimization program which includes automated exposure control, adjustment of the mA and/or kV according to patient size and/or use of iterative reconstruction technique. COMPARISON:  CT abdomen/pelvis dated 04/09/2017. Multiple renal ultrasounds, most recently  05/23/2022. FINDINGS: Lower chest: Patchy opacity with peribronchovascular nodularity in the right lower lobe, suspicious for pneumonia. Hepatobiliary: Unenhanced liver is unremarkable. Status post cholecystectomy. No intrahepatic or extrahepatic dilatation. Pancreas: Within normal limits. Spleen: Within normal limits. Adrenals/Urinary Tract: Adrenal glands are within normal limits. Left kidney is notable for a left upper pole renal sinus cyst and an 18 mm simple exophytic cyst along the left lower  kidney (series 2/image 41), benign. No follow-up is recommended. Mild left hydroureteronephrosis, new. Mild periureteral stranding (series 2/image 39) raises the possibility of infection. 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 No hydronephrosis. Bladder is underdistended but unremarkable. Stomach/Bowel: Stomach is within normal limits. No evidence of bowel obstruction. Normal appendix (series 2/image 53). Mild left colonic diverticulosis, without evidence of diverticulitis. Vascular/Lymphatic: 5.0 x 5.0 cm infrarenal abdominal aortic aneurysm, previously 3.9 x 3.8 cm in 2018. Atherosclerotic calcifications of the abdominal aorta and branch vessels. No suspicious abdominopelvic lymphadenopathy. Reproductive: Prostate is unremarkable. Other: No abdominopelvic ascites. Small fat containing midline supraumbilical hernia (series 2/image 44). Musculoskeletal: Degenerative changes of the visualized thoracolumbar spine. IMPRESSION: Mild left hydroureteronephrosis, new. Associated mild periureteral stranding raises the possibility of infection. Patchy opacity with peribronchovascular nodularity in the right lower lobe, suspicious for pneumonia. 5.0 x 5.0 cm infrarenal abdominal aortic aneurysm, previously 3.9 x 3.8 cm in 2018. Recommend follow-up every 6 months and vascular consultation.  Reference: J Am Coll Radiol [id number]. Additional stable ancillary findings as above. Electronically Signed   By: Julian Hy M.D.   On: 12/14/2022 19:42    Microbiology: Recent Results (from the past 240 hour(s))  Urine Culture (for pregnant, neutropenic or urologic patients or patients with an indwelling urinary catheter)     Status: None   Collection Time: 12/15/22  8:30 AM   Specimen: Urine, Clean Catch  Result Value Ref Range Status   Specimen Description   Final    URINE, CLEAN CATCH Performed at [facility], [address], [place], [place] [address]    Special Requests   Final    NONE Performed at [facility], [address], [place], [place] [address]    Culture   Final    NO GROWTH Performed at [facility], [place] [address] [place], [place] [address]    Report Status 12/16/2022 FINAL  Final  C Difficile Quick Screen w PCR reflex     Status: None   Collection Time: 12/15/22  9:05 AM   Specimen: STOOL  Result Value Ref Range Status   C Diff antigen NEGATIVE NEGATIVE Final   C Diff toxin NEGATIVE NEGATIVE Final   C Diff interpretation No C. difficile detected.  Final    Comment: Performed at [facility], [place] [address] [place],  [address]  Gastrointestinal Panel by PCR , Stool     Status: None   Collection Time: 12/15/22  9:05 AM   Specimen: Stool  Result Value Ref Range Status   Campylobacter species NOT DETECTED NOT DETECTED Final   Plesimonas shigelloides NOT DETECTED NOT DETECTED Final   Salmonella species NOT DETECTED NOT DETECTED Final   Yersinia enterocolitica NOT DETECTED NOT DETECTED Final   Vibrio species NOT DETECTED NOT DETECTED Final   Vibrio cholerae NOT DETECTED NOT DETECTED Final   Enteroaggregative E coli (EAEC) NOT DETECTED NOT DETECTED Final   Enteropathogenic E coli (EPEC) NOT DETECTED NOT DETECTED Final   Enterotoxigenic E coli (ETEC) NOT DETECTED NOT DETECTED Final   Shiga like toxin  producing E coli (STEC) NOT DETECTED NOT DETECTED Final   Shigella/Enteroinvasive E coli (EIEC) NOT DETECTED NOT DETECTED Final   Cryptosporidium NOT DETECTED NOT DETECTED Final   Cyclospora cayetanensis NOT DETECTED NOT DETECTED Final   Entamoeba histolytica NOT DETECTED NOT DETECTED Final   Giardia lamblia NOT DETECTED NOT DETECTED Final  Adenovirus F40/41 NOT DETECTED NOT DETECTED Final   Astrovirus NOT DETECTED NOT DETECTED Final   Norovirus GI/GII NOT DETECTED NOT DETECTED Final   Rotavirus A NOT DETECTED NOT DETECTED Final   Sapovirus (I, II, IV, and V) NOT DETECTED NOT DETECTED Final    Comment: Performed at [facility], [address] [place], [place] [address]     Labs: Basic Metabolic Panel: Recent Labs  Lab 12/15/22 0346 12/15/22 0348 12/16/22 0559 12/17/22 0417 12/18/22 0532 12/19/22 0410  NA  --  140 138 140 140 136  K  --  4.1 4.2 4.1 4.2 3.7  CL  --  116* 111 110 110 106  CO2  --  17* 20* '23 23 24  '$ GLUCOSE  --  83 113* 95 97 80  BUN  --  50* 42* 34* 29* 26*  CREATININE  --  2.43* 2.35* 2.19* 2.05* 2.04*  CALCIUM  --  8.2* 8.0* 7.9* 7.6* 7.2*  MG  --  1.8 1.9 1.8 1.8  --   PHOS 4.0  --  3.7 3.5 3.2  --    Liver Function Tests: Recent Labs  Lab 12/15/22 0348  AST 15  ALT 11  ALKPHOS 91  BILITOT 0.5  PROT 4.6*  ALBUMIN 2.8*   No results for input(s): "LIPASE", "AMYLASE" in the last 168 hours. No results for input(s): "AMMONIA" in the last 168 hours. CBC: Recent Labs  Lab 12/12/22 1328 12/14/22 1612 12/15/22 0348 12/16/22 0559 12/17/22 0417 12/18/22 0532 12/19/22 0410  WBC 14.7*   < > 13.0* 10.4 12.7* 14.0* 13.9*  NEUTROABS 3.0  --  2.2  --   --   --   --   HGB 9.2*   < > 8.5* 8.4* 8.1* 8.2* 7.7*  HCT 29.5*   < > 26.8* 26.5* 26.1* 26.2* 24.6*  MCV 103.1*   < > 101.5* 102.3* 103.6* 103.6* 103.4*  PLT 57*   < > 57* 41* 51* 46* 39*   < > = values in this interval not displayed.   Cardiac Enzymes: No results for input(s):  "CKTOTAL", "CKMB", "CKMBINDEX", "TROPONINI" in the last 168 hours. BNP: BNP (last 3 results) No results for input(s): "BNP" in the last 8760 hours.  ProBNP (last 3 results) No results for input(s): "PROBNP" in the last 8760 hours.  CBG: Recent Labs  Lab 12/18/22 1144 12/18/22 1545 12/18/22 2107 12/19/22 0742 12/19/22 1131  GLUCAP 121* 87 109* 78 122*       Signed:  Desma Maxim MD.  [facility] 12/19/2022, 2:08 PM

## 2022-12-19 NOTE — Telephone Encounter (Signed)
Wife called to cancel appt. Stating her husband was admitted in the hospital because of his kidneys. Appointment has been canceled

## 2022-12-19 NOTE — Care Management Important Message (Signed)
Important Message  Patient Details  Name: Patrick Payne. MRN: [id number] Date of Birth: Nov 10, 1933   Medicare Important Message Given:  Yes     Dannette Barbara 12/19/2022, 11:47 AM

## 2022-12-19 NOTE — TOC Transition Note (Signed)
Transition of Care [facility]) - CM/SW Discharge Note   Patient Details  Name: Patrick Payne. MRN: [id number] Date of Birth: 06-23-1934  [facility]) CM/SW Contact:  Gerilyn Pilgrim, LCSW Phone Number: 12/19/2022, 2:14 PM   Clinical Narrative:   Pt has orders to discharge home with [facility]. [facility] accepted for PT/OT. Bobby with [facility] notified. CSW signing off.           Patient Goals and CMS Choice      Discharge Placement                         Discharge Plan and Services Additional resources added to the After Visit Summary for                                       Social Determinants of Health (SDOH) Interventions SDOH Screenings   Food Insecurity: No Food Insecurity (12/15/2022)  Housing: Low Risk  (12/15/2022)  Transportation Needs: No Transportation Needs (12/15/2022)  Utilities: Not At Risk (12/15/2022)  Tobacco Use: Medium Risk (12/14/2022)     Readmission Risk Interventions     No data to display

## 2022-12-20 ENCOUNTER — Ambulatory Visit (INDEPENDENT_AMBULATORY_CARE_PROVIDER_SITE_OTHER): Payer: Medicare Other | Admitting: Vascular Surgery

## 2022-12-20 ENCOUNTER — Other Ambulatory Visit (INDEPENDENT_AMBULATORY_CARE_PROVIDER_SITE_OTHER): Payer: Medicare Other

## 2022-12-21 ENCOUNTER — Telehealth (INDEPENDENT_AMBULATORY_CARE_PROVIDER_SITE_OTHER): Payer: Self-pay

## 2022-12-21 NOTE — Telephone Encounter (Signed)
Patrick Payne missed his appt 12/20/22 because he was admitted in the hospital because of his kidneys but the did an AAA ultra sound as well and it has grown slightly from a 4.9 to a 5. He wants a follow up appt to discuss his results to get more details. He said contact him on his home phone  if he doesn't answer call his cell [phone]

## 2022-12-21 NOTE — Telephone Encounter (Signed)
He can come and see Patrick Payne to discuss his CT

## 2023-01-09 ENCOUNTER — Other Ambulatory Visit: Payer: Self-pay

## 2023-01-09 DIAGNOSIS — D631 Anemia in chronic kidney disease: Secondary | ICD-10-CM

## 2023-01-10 ENCOUNTER — Inpatient Hospital Stay: Payer: Medicare Other | Attending: Oncology

## 2023-01-10 ENCOUNTER — Inpatient Hospital Stay (HOSPITAL_BASED_OUTPATIENT_CLINIC_OR_DEPARTMENT_OTHER): Payer: Medicare Other | Admitting: Oncology

## 2023-01-10 ENCOUNTER — Encounter: Payer: Self-pay | Admitting: Oncology

## 2023-01-10 ENCOUNTER — Inpatient Hospital Stay: Payer: Medicare Other

## 2023-01-10 VITALS — BP 137/69 | HR 62 | Temp 98.4°F | Resp 16 | Ht 72.0 in | Wt 200.4 lb

## 2023-01-10 DIAGNOSIS — D631 Anemia in chronic kidney disease: Secondary | ICD-10-CM

## 2023-01-10 DIAGNOSIS — C9110 Chronic lymphocytic leukemia of B-cell type not having achieved remission: Secondary | ICD-10-CM

## 2023-01-10 DIAGNOSIS — N1832 Chronic kidney disease, stage 3b: Secondary | ICD-10-CM | POA: Diagnosis present

## 2023-01-10 DIAGNOSIS — N189 Chronic kidney disease, unspecified: Secondary | ICD-10-CM

## 2023-01-10 LAB — CBC WITH DIFFERENTIAL (CANCER CENTER ONLY)
Abs Immature Granulocytes: 0 10*3/uL (ref 0.00–0.07)
Basophils Absolute: 0 10*3/uL (ref 0.0–0.1)
Basophils Relative: 0 %
Eosinophils Absolute: 0.1 10*3/uL (ref 0.0–0.5)
Eosinophils Relative: 1 %
HCT: 27 % — ABNORMAL LOW (ref 39.0–52.0)
Hemoglobin: 8.4 g/dL — ABNORMAL LOW (ref 13.0–17.0)
Immature Granulocytes: 0 %
Lymphocytes Relative: 78 %
Lymphs Abs: 7.2 10*3/uL — ABNORMAL HIGH (ref 0.7–4.0)
MCH: 32.7 pg (ref 26.0–34.0)
MCHC: 31.1 g/dL (ref 30.0–36.0)
MCV: 105.1 fL — ABNORMAL HIGH (ref 80.0–100.0)
Monocytes Absolute: 1 10*3/uL (ref 0.1–1.0)
Monocytes Relative: 11 %
Neutro Abs: 1 10*3/uL — ABNORMAL LOW (ref 1.7–7.7)
Neutrophils Relative %: 10 %
Platelet Count: 43 10*3/uL — ABNORMAL LOW (ref 150–400)
RBC: 2.57 MIL/uL — ABNORMAL LOW (ref 4.22–5.81)
RDW: 15 % (ref 11.5–15.5)
Smear Review: NORMAL
WBC Count: 9.4 10*3/uL (ref 4.0–10.5)
nRBC: 0 % (ref 0.0–0.2)

## 2023-01-10 MED ORDER — EPOETIN ALFA-EPBX 40000 UNIT/ML IJ SOLN
40000.0000 [IU] | Freq: Once | INTRAMUSCULAR | Status: AC
  Administered 2023-01-10: 40000 [IU] via SUBCUTANEOUS
  Filled 2023-01-10: qty 1

## 2023-01-10 NOTE — Progress Notes (Signed)
[facility]  [phone] [phone]  ID: Patrick Altes Westley Jr. OB: 1933-12-04  MR#: [id number]  [id number]  Patient Care Team: Idelle Crouch, MD as PCP - General (Internal Medicine) Lloyd Huger, MD as Consulting Physician (Oncology)  CHIEF COMPLAINT: CLL, anemia secondary to chronic renal insufficiency  INTERVAL HISTORY: Patient returns to clinic today for repeat laboratory work, further evaluation, and continuation of Retacrit.  He continues to have chronic weakness and fatigue.  He denies any fevers, night sweats, or weight loss. He has noted no new lymphadenopathy.  He has no neurologic complaints today.  He denies any chest pain, shortness of breath, cough, or hemoptysis.  He denies any nausea, vomiting, constipation, or diarrhea. He has no urinary complaints.  Patient offers no further specific complaints today.  REVIEW OF SYSTEMS:   Review of Systems  Constitutional:  Positive for malaise/fatigue. Negative for diaphoresis, fever and weight loss.  Respiratory: Negative.  Negative for cough and shortness of breath.   Cardiovascular: Negative.  Negative for chest pain and leg swelling.  Gastrointestinal: Negative.  Negative for abdominal pain, blood in stool and melena.  Genitourinary: Negative.  Negative for dysuria.  Musculoskeletal: Negative.  Negative for back pain.  Skin: Negative.  Negative for rash.  Neurological:  Positive for weakness. Negative for dizziness, sensory change, focal weakness and headaches.  Psychiatric/Behavioral: Negative.  The patient is not nervous/anxious.     As per HPI. Otherwise, a complete review of systems is negative.  PAST MEDICAL HISTORY: Past Medical History:  Diagnosis Date   Anxiety    CKD (chronic kidney disease), stage III    CLL (chronic lymphocytic leukemia)    Diabetes mellitus without complication    Diverticulitis 04/09/2017   History of kidney stones    HLD (hyperlipidemia)     HTN (hypertension)    PKD (polycystic kidney disease)    Sleep apnea    Uncontrolled type 2 diabetes mellitus with hyperglycemia 06/28/2018    PAST SURGICAL HISTORY: Past Surgical History:  Procedure Laterality Date   CARDIAC CATHETERIZATION  12/1987   CENTRAL LINE INSERTION N/A 04/11/2017   Procedure: Central Line Insertion;  Surgeon: Katha Cabal, MD;  Location: [facility];  Service: Cardiovascular;  Laterality: N/A;   CERVICAL FUSION     CHOLECYSTECTOMY     CYSTOSCOPY W/ RETROGRADES Left 05/12/2017   Procedure: CYSTOSCOPY WITH RETROGRADE PYELOGRAM;  Surgeon: Nickie Retort, MD;  Location: [facility];  Service: Urology;  Laterality: Left;   CYSTOSCOPY W/ URETERAL STENT PLACEMENT Left 05/12/2017   Procedure: CYSTOSCOPY WITH STENT REMOVAL;  Surgeon: Nickie Retort, MD;  Location: [facility];  Service: Urology;  Laterality: Left;   CYSTOSCOPY WITH STENT PLACEMENT Left 04/11/2017   Procedure: CYSTOSCOPY WITH STENT PLACEMENT;  Surgeon: Festus Aloe, MD;  Location: [facility];  Service: Urology;  Laterality: Left;   URETEROSCOPY  05/12/2017   Procedure: URETEROSCOPY;  Surgeon: Nickie Retort, MD;  Location: [facility];  Service: Urology;;    FAMILY HISTORY: Reported history of ovarian and lung cancer. Diabetes, hypertension.     ADVANCED DIRECTIVES:    HEALTH MAINTENANCE: Social History   Tobacco Use   Smoking status: Former    Passive exposure: Past   Smokeless tobacco: Never   Tobacco comments:    quit in Feb of 1997  Substance Use Topics   Alcohol use: No   Drug use: No     Colonoscopy:  PAP:  Bone density:  Lipid panel:  Allergies  Allergen Reactions   Celecoxib Other (See Comments)    Increased Blood Pressure   Chlorpromazine Nausea Only   Prednisone Other (See Comments)    Diabetic   Amoxicillin-Pot Clavulanate Rash    Has patient had a PCN reaction causing immediate rash, facial/tongue/throat swelling, SOB or lightheadedness with  hypotension: No Has patient had a PCN reaction causing severe rash involving mucus membranes or skin necrosis: No Has patient had a PCN reaction that required hospitalization: No Has patient had a PCN reaction occurring within the last 10 years: Yes If all of the above answers are "NO", then may proceed with Cephalosporin use.    Penicillin V Potassium Rash    Current Outpatient Medications  Medication Sig Dispense Refill   Aflibercept 2 MG/0.05ML SOLN Inject 1 Dose into the eye as directed. One injection into left eye every 8-9 weeks     allopurinol (ZYLOPRIM) 100 MG tablet Take 100 mg by mouth daily.     atorvastatin (LIPITOR) 10 MG tablet Take 10 mg by mouth at bedtime.      BD PEN NEEDLE NANO 2ND GEN 32G X 4 MM MISC USE AS DIRECTED ONCE A DAY     carvedilol (COREG) 12.5 MG tablet Take 12.5 mg by mouth 2 (two) times daily with a meal.     Cholecalciferol (VITAMIN D-3) 5000 units TABS Take 5,000 Units by mouth at bedtime.      clonazePAM (KLONOPIN) 1 MG tablet Take 1 mg by mouth at bedtime.      CONTOUR NEXT TEST test strip daily.     cyanocobalamin 500 MCG tablet Take 1,000 mcg by mouth at bedtime.      empagliflozin (JARDIANCE) 10 MG TABS tablet 10 mg daily.     epoetin alfa-epbx (RETACRIT) 16606 UNIT/ML injection Inject 40,000 Units into the skin every 6 (six) weeks. Depending on lab results (if needed)     finasteride (PROSCAR) 5 MG tablet Take 1 tablet (5 mg total) by mouth daily. 90 tablet 3   folic acid (FOLVITE) A999333 MCG tablet Take 400 mcg by mouth daily.     insulin glargine (LANTUS) 100 UNIT/ML injection Inject 20-30 Units into the skin every morning. Takes Lantus 20 units if CBG less than 120 mg/dl or Lantus 30 units if CBG is over 120 mg/dl     LORazepam (ATIVAN) 1 MG tablet Take 1 mg by mouth every 8 (eight) hours as needed for anxiety (takes 1 tablet every morning.  Rarely has to take again during the day).      Multiple Vitamins-Minerals (PRESERVISION AREDS 2 PO) Take 1  capsule by mouth 2 (two) times daily.      omega-3 acid ethyl esters (LOVAZA) 1 g capsule Take by mouth 2 (two) times daily.     tamsulosin (FLOMAX) 0.4 MG CAPS capsule Take 1 capsule (0.4 mg total) by mouth daily after supper. 90 capsule 3   telmisartan (MICARDIS) 40 MG tablet Take 40 mg by mouth daily.     triamcinolone (NASACORT) 55 MCG/ACT AERO nasal inhaler Place 2 sprays into the nose 2 (two) times daily as needed (allergies).      fexofenadine (ALLEGRA) 180 MG tablet Take 180 mg by mouth daily as needed for allergies.  (Patient not taking: Reported on 12/15/2022)     No current facility-administered medications for this visit.   Facility-Administered Medications Ordered in Other Visits  Medication Dose Route Frequency Provider Last Rate Last Admin   epoetin alfa-epbx (RETACRIT) injection 40,000 Units  40,000 Units Subcutaneous Once Lloyd Huger, MD        OBJECTIVE: Vitals:   01/10/23 1309  BP: 137/69  Pulse: 62  Resp: 16  Temp: 98.4 F (36.9 C)  SpO2: 100%      Body mass index is 27.18 kg/m.    ECOG FS:0 - Asymptomatic  General: Well-developed, well-nourished, no acute distress. Eyes: Pink conjunctiva, anicteric sclera. HEENT: Normocephalic, moist mucous membranes. Lungs: No audible wheezing or coughing. Heart: Regular rate and rhythm. Abdomen: Soft, nontender, no obvious distention. Musculoskeletal: No edema, cyanosis, or clubbing. Neuro: Alert, answering all questions appropriately. Cranial nerves grossly intact. Skin: No rashes or petechiae noted. Psych: Normal affect.  LAB RESULTS:  Lab Results  Component Value Date   NA 136 12/19/2022   K 3.7 12/19/2022   CL 106 12/19/2022   CO2 24 12/19/2022   GLUCOSE 80 12/19/2022   BUN 26 (H) 12/19/2022   CREATININE 2.04 (H) 12/19/2022   CALCIUM 7.2 (L) 12/19/2022   PROT 4.6 (L) 12/15/2022   ALBUMIN 2.8 (L) 12/15/2022   AST 15 12/15/2022   ALT 11 12/15/2022   ALKPHOS 91 12/15/2022   BILITOT 0.5 12/15/2022    GFRNONAA 31 (L) 12/19/2022   GFRAA 37 (L) 11/19/2019    Lab Results  Component Value Date   WBC 9.4 01/10/2023   NEUTROABS 1.0 (L) 01/10/2023   HGB 8.4 (L) 01/10/2023   HCT 27.0 (L) 01/10/2023   MCV 105.1 (H) 01/10/2023   PLT 43 (L) 01/10/2023   Lab Results  Component Value Date   IRON 67 12/15/2022   TIBC 251 12/15/2022   IRONPCTSAT 27 12/15/2022   Lab Results  Component Value Date   FERRITIN 31 08/25/2021     STUDIES: CT Renal Stone Study  Result Date: 12/14/2022 CLINICAL DATA:  Abdominal pain EXAM: CT ABDOMEN AND PELVIS WITHOUT CONTRAST TECHNIQUE: Multidetector CT imaging of the abdomen and pelvis was performed following the standard protocol without IV contrast. RADIATION DOSE REDUCTION: This exam was performed according to the departmental dose-optimization program which includes automated exposure control, adjustment of the mA and/or kV according to patient size and/or use of iterative reconstruction technique. COMPARISON:  CT abdomen/pelvis dated 04/09/2017. Multiple renal ultrasounds, most recently 05/23/2022. FINDINGS: Lower chest: Patchy opacity with peribronchovascular nodularity in the right lower lobe, suspicious for pneumonia. Hepatobiliary: Unenhanced liver is unremarkable. Status post cholecystectomy. No intrahepatic or extrahepatic dilatation. Pancreas: Within normal limits. Spleen: Within normal limits. Adrenals/Urinary Tract: Adrenal glands are within normal limits. Left kidney is notable for a left upper pole renal sinus cyst and an 18 mm simple exophytic cyst along the left lower kidney (series 2/image 41), benign. No follow-up is recommended. Mild left hydroureteronephrosis, new. Mild periureteral stranding (series 2/image 39) raises the possibility of infection. 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 No hydronephrosis.  Bladder is underdistended but unremarkable. Stomach/Bowel: Stomach is within normal limits. No evidence of bowel obstruction. Normal appendix (series 2/image 53). Mild left colonic diverticulosis, without evidence of diverticulitis. Vascular/Lymphatic: 5.0 x 5.0 cm infrarenal abdominal aortic aneurysm, previously 3.9 x 3.8 cm in 2018. Atherosclerotic calcifications of the abdominal aorta and branch vessels. No suspicious abdominopelvic lymphadenopathy. Reproductive: Prostate is unremarkable. Other: No abdominopelvic ascites. Small fat containing midline supraumbilical hernia (series 2/image 44). Musculoskeletal: Degenerative changes of the visualized thoracolumbar spine. IMPRESSION: Mild left hydroureteronephrosis, new. Associated mild periureteral stranding raises the possibility of infection. Patchy  opacity with peribronchovascular nodularity in the right lower lobe, suspicious for pneumonia. 5.0 x 5.0 cm infrarenal abdominal aortic aneurysm, previously 3.9 x 3.8 cm in 2018. Recommend follow-up every 6 months and vascular consultation. Reference: J Am Coll Radiol [id number]. Additional stable ancillary findings as above. Electronically Signed   By: Julian Hy M.D.   On: 12/14/2022 19:42    ASSESSMENT: Rai stage I CLL, anemia secondary to chronic renal insufficiency.  PLAN:    Anemia secondary to chronic renal insufficiency: Chronic and unchanged.  Patient's hemoglobin today is 8.4 today.  Previously all of his other laboratory work including iron panel are either negative or within normal limits.  Proceed with 40,000 units Retacrit today.  Return to clinic monthly x 5 for laboratory work and Retacrit if his hemoglobin remains below 10.0.  Patient will then return to clinic in 6 months for further evaluation and continuation of treatment if needed.  CLL: Chronic and unchanged.  Patient's total white blood cell count has ranged between 9.4 and 24.8 since May 2013.  Today's result is 9.4.  No  intervention is needed.  Patient does not require bone marrow biopsy or repeat imaging.  Continue simple observation. Thrombocytopenia: Chronic and unchanged.  Patient's platelet count was as high as 95 in June 2016.  But more recently between has ranged from 37-63 since June 2019.  Today's result is 43.  Continue simple observation as above. Chronic renal insufficiency: Chronic and unchanged.  Continue monitoring and treatment per nephrology. Lymphadenopathy: CT scan results from April 09, 2017 reviewed independently with enlarged pelvic and retroperitoneal lymph nodes. These were also noted on a scan in 2012 appear to be slightly larger. No intervention is needed. No further imaging is necessary unless there is suspicion of progression of disease.  Disposition: Follow-up as above.  Will attempt to coordinate visits with patient's wife.  Patient expressed understanding and was in agreement with this plan. He also understands that He can call clinic at any time with any questions, concerns, or complaints.   Lloyd Huger, MD   01/10/2023 4:24 PM

## 2023-01-13 ENCOUNTER — Encounter (INDEPENDENT_AMBULATORY_CARE_PROVIDER_SITE_OTHER): Payer: Self-pay | Admitting: Vascular Surgery

## 2023-01-13 ENCOUNTER — Ambulatory Visit (INDEPENDENT_AMBULATORY_CARE_PROVIDER_SITE_OTHER): Payer: Medicare Other | Admitting: Vascular Surgery

## 2023-01-13 VITALS — BP 160/73 | HR 70

## 2023-01-13 DIAGNOSIS — I10 Essential (primary) hypertension: Secondary | ICD-10-CM | POA: Diagnosis not present

## 2023-01-13 DIAGNOSIS — I6523 Occlusion and stenosis of bilateral carotid arteries: Secondary | ICD-10-CM | POA: Diagnosis not present

## 2023-01-13 DIAGNOSIS — E1122 Type 2 diabetes mellitus with diabetic chronic kidney disease: Secondary | ICD-10-CM

## 2023-01-13 DIAGNOSIS — I7143 Infrarenal abdominal aortic aneurysm, without rupture: Secondary | ICD-10-CM | POA: Diagnosis not present

## 2023-01-13 DIAGNOSIS — E785 Hyperlipidemia, unspecified: Secondary | ICD-10-CM

## 2023-01-13 DIAGNOSIS — Z794 Long term (current) use of insulin: Secondary | ICD-10-CM

## 2023-01-13 DIAGNOSIS — N1832 Chronic kidney disease, stage 3b: Secondary | ICD-10-CM

## 2023-01-13 NOTE — Patient Instructions (Signed)
Abdominal Aortic Aneurysm Endograft Repair, Care After After abdominal aortic aneurysm endograft repair it is common to have pain or soreness at the incision site. You may also have tiredness (fatigue). Follow these instructions at home: Medicines Take over-the-counter and prescription medicines only as told by your health care provider. If you were prescribed antibiotics, take them as told by your provider. Do not stop using the antibiotic even if you start to feel better. Incision care  Follow instructions from your provider about how to take care of your incisions. Make sure you: Wash your hands with soap and water for at least 20 seconds before and after you change your bandage (dressing). If soap and water are not available, use hand sanitizer. Change your dressing as told by your health care provider. Leave stitches (sutures), skin glue, or tape strips in place. These skin closures may need to stay in place for 2 weeks or longer. If tape strip edges start to loosen and curl up, you may trim the loose edges. Do not remove tape strips completely unless your provider tells you to do that. Check your incision area every day for signs of infection. Check for: Redness, swelling, or more pain. Fluid or blood. Warmth. Pus or a bad smell. Keep the incision area clean and dry. Check your incisions for a lump or swelling. These can be signs of bleeding at your incision site. Activity Rest as told by your provider. Do not sit for a long time without moving. Get up to take short walks every 1-2 hours. This will improve blood flow and breathing. Ask for help if you feel weak or unsteady. You may have to avoid lifting. Ask your provider how much you can safely lift. Do not drive or operate machinery until your provider says that it is safe. Return to your normal activities as told by your provider. Ask your provider what activities are safe for you. Lifestyle  Do not use any products that contain  nicotine or tobacco. These products include cigarettes, chewing tobacco, and vaping devices, such as e-cigarettes. These can delay incision healing after surgery. If you need help quitting, ask your provider. Make any lifestyle changes that your provider suggests. You may need to: Keep your blood pressure under control. Find ways to lower stress. Eat healthy foods that are good for your heart. These include vegetables, fruits, and whole grains that add fiber to your diet. Get regular exercise once your provider tells you it is safe to do so. General instructions Do not take baths, swim, or use a hot tub until your provider approves. Ask your provider if you may take showers. You may only be allowed to take sponge baths. Drink enough fluid to keep your pee (urine) pale yellow. Wear compression stockings as told by your provider. These stockings help to prevent blood clots and reduce swelling in your legs. Keep all follow-up visits. Your provider will need to monitor your healing and make sure the graft is working like it should. Your health care provider may give you more instructions. Make sure you know what you can and cannot do. Contact a health care provider if: You have pain in your abdomen, chest, legs, or back. You have any signs of infection. You have a fever. Get help right away if: You have trouble breathing. You have sudden pain in your legs, or you have trouble moving either of your legs. You feel light-headed, or you faint. These symptoms may be an emergency. Get help right away. Call   911. Do not wait to see if the symptoms will go away. Do not drive yourself to the hospital. This information is not intended to replace advice given to you by your health care provider. Make sure you discuss any questions you have with your health care provider. Document Revised: 05/03/2022 Document Reviewed: 05/03/2022 Elsevier Patient Education  2023 [employer]  

## 2023-01-13 NOTE — Assessment & Plan Note (Addendum)
The patient has had continued growth of his abdominal aortic aneurysm and it now measures 5.0 cm in maximal diameter.  This represents a 9 mm growth over the last 14 months.  5 independently reviewed his CT scan.  Although this was an uninfused scan, he has a previous diffuse scan from several years ago and the aneurysm was smaller.  He appears to have favorable anatomy for endovascular repair.  Given his age and comorbidities, obviously endovascular repair would be highly preferred over open surgical repair.  I discussed the risks and benefits of the procedure.  I discussed the pathophysiology and natural history of aneurysm disease.  I discussed my plan repair generally at 5 cm.  He voices understanding and is agreeable to proceed with endovascular abdominal aortic aneurysm repair.  We generally get a CT angiogram prior to repair, but with his significant chronic kidney disease, will defer CT angiogram particularly with his favorable.  We can use intravascular ultrasound intraoperatively if needed.

## 2023-01-13 NOTE — Progress Notes (Signed)
MRN : [id number]  Patrick Payne Large Montez Hageman. is a 89 y.o. (1934/07/29) male who presents with chief complaint of No chief complaint on file. Marland Kitchen  History of Present Illness: Patient returns today in follow up of his abdominal aortic aneurysm.  He recently had a CT scan of the abdomen pelvis which I independently reviewed.  This was performed for urologic reasons, and did not have contrast.  Nonetheless, he clearly has had growth of his abdominal aortic aneurysm and it now measures 5.0 cm in maximal diameter.  This was 4.6 cm about 6 to 7 months ago and 4.1 cm about 14 months ago.  He does not have any clear aneurysm related symptoms. Specifically, the patient denies new back or abdominal pain, or signs of peripheral embolization.  He does have fairly significant chronic kidney disease which would cause hesitation in getting a CT angiogram.  Current Outpatient Medications  Medication Sig Dispense Refill   Aflibercept 2 MG/0.05ML SOLN Inject 1 Dose into the eye as directed. One injection into left eye every 8-9 weeks     allopurinol (ZYLOPRIM) 100 MG tablet Take 100 mg by mouth daily.     atorvastatin (LIPITOR) 10 MG tablet Take 10 mg by mouth at bedtime.      BD PEN NEEDLE NANO 2ND GEN 32G X 4 MM MISC USE AS DIRECTED ONCE A DAY     carvedilol (COREG) 12.5 MG tablet Take 12.5 mg by mouth 2 (two) times daily with a meal.     Cholecalciferol (VITAMIN D-3) 5000 units TABS Take 5,000 Units by mouth at bedtime.      clonazePAM (KLONOPIN) 1 MG tablet Take 1 mg by mouth at bedtime.      CONTOUR NEXT TEST test strip daily.     cyanocobalamin 500 MCG tablet Take 1,000 mcg by mouth at bedtime.      empagliflozin (JARDIANCE) 10 MG TABS tablet 10 mg daily.     epoetin alfa-epbx (RETACRIT) 40981 UNIT/ML injection Inject 40,000 Units into the skin every 6 (six) weeks. Depending on lab results (if needed)     fexofenadine (ALLEGRA) 180 MG tablet Take 180 mg by mouth daily as needed for allergies.     finasteride  (PROSCAR) 5 MG tablet Take 1 tablet (5 mg total) by mouth daily. 90 tablet 3   folic acid (FOLVITE) 400 MCG tablet Take 400 mcg by mouth daily.     insulin glargine (LANTUS) 100 UNIT/ML injection Inject 20-30 Units into the skin every morning. Takes Lantus 20 units if CBG less than 120 mg/dl or Lantus 30 units if CBG is over 120 mg/dl     LORazepam (ATIVAN) 1 MG tablet Take 1 mg by mouth every 8 (eight) hours as needed for anxiety (takes 1 tablet every morning.  Rarely has to take again during the day).      Multiple Vitamins-Minerals (PRESERVISION AREDS 2 PO) Take 1 capsule by mouth 2 (two) times daily.      omega-3 acid ethyl esters (LOVAZA) 1 g capsule Take by mouth 2 (two) times daily.     tamsulosin (FLOMAX) 0.4 MG CAPS capsule Take 1 capsule (0.4 mg total) by mouth daily after supper. 90 capsule 3   telmisartan (MICARDIS) 40 MG tablet Take 40 mg by mouth daily.     triamcinolone (NASACORT) 55 MCG/ACT AERO nasal inhaler Place 2 sprays into the nose 2 (two) times daily as needed (allergies).      No current facility-administered medications for this visit.  Facility-Administered Medications Ordered in Other Visits  Medication Dose Route Frequency Provider Last Rate Last Admin   epoetin alfa-epbx (RETACRIT) injection 40,000 Units  40,000 Units Subcutaneous Once Jeralyn Ruths, MD        Past Medical History:  Diagnosis Date   Anxiety    CKD (chronic kidney disease), stage III    CLL (chronic lymphocytic leukemia)    Diabetes mellitus without complication    Diverticulitis 04/09/2017   History of kidney stones    HLD (hyperlipidemia)    HTN (hypertension)    PKD (polycystic kidney disease)    Sleep apnea    Uncontrolled type 2 diabetes mellitus with hyperglycemia 06/28/2018    Past Surgical History:  Procedure Laterality Date   CARDIAC CATHETERIZATION  12/1987   CENTRAL LINE INSERTION N/A 04/11/2017   Procedure: Central Line Insertion;  Surgeon: Renford Dills, MD;   Location: [facility];  Service: Cardiovascular;  Laterality: N/A;   CERVICAL FUSION     CHOLECYSTECTOMY     CYSTOSCOPY W/ RETROGRADES Left 05/12/2017   Procedure: CYSTOSCOPY WITH RETROGRADE PYELOGRAM;  Surgeon: Hildred Laser, MD;  Location: [facility];  Service: Urology;  Laterality: Left;   CYSTOSCOPY W/ URETERAL STENT PLACEMENT Left 05/12/2017   Procedure: CYSTOSCOPY WITH STENT REMOVAL;  Surgeon: Hildred Laser, MD;  Location: [facility];  Service: Urology;  Laterality: Left;   CYSTOSCOPY WITH STENT PLACEMENT Left 04/11/2017   Procedure: CYSTOSCOPY WITH STENT PLACEMENT;  Surgeon: Jerilee Field, MD;  Location: [facility];  Service: Urology;  Laterality: Left;   URETEROSCOPY  05/12/2017   Procedure: URETEROSCOPY;  Surgeon: Hildred Laser, MD;  Location: [facility];  Service: Urology;;     Social History   Tobacco Use   Smoking status: Former    Passive exposure: Past   Smokeless tobacco: Never   Tobacco comments:    quit in Feb of 1997  Substance Use Topics   Alcohol use: No   Drug use: No      Family History  Problem Relation Age of Onset   Cancer Mother    Varicose Veins Mother    Cancer Father      Allergies  Allergen Reactions   Celecoxib Other (See Comments)    Increased Blood Pressure   Chlorpromazine Nausea Only   Prednisone Other (See Comments)    Diabetic   Amoxicillin-Pot Clavulanate Rash    Has patient had a PCN reaction causing immediate rash, facial/tongue/throat swelling, SOB or lightheadedness with hypotension: No Has patient had a PCN reaction causing severe rash involving mucus membranes or skin necrosis: No Has patient had a PCN reaction that required hospitalization: No Has patient had a PCN reaction occurring within the last 10 years: Yes If all of the above answers are "NO", then may proceed with Cephalosporin use.    Penicillin V Potassium Rash    REVIEW OF SYSTEMS (Negative unless checked)   Constitutional: [] Weight loss   [] Fever  [] Chills Cardiac: [] Chest pain   [] Chest pressure   [] Palpitations   [] Shortness of breath when laying flat   [] Shortness of breath at rest   [] Shortness of breath with exertion. Vascular:  [] Pain in legs with walking   [] Pain in legs at rest   [] Pain in legs when laying flat   [] Claudication   [] Pain in feet when walking  [] Pain in feet at rest  [] Pain in feet when laying flat   [] History of DVT   [] Phlebitis   [] Swelling in legs   []   Varicose veins   [] Non-healing ulcers Pulmonary:   [] Uses home oxygen   [] Productive cough   [] Hemoptysis   [] Wheeze  [] COPD   [] Asthma Neurologic:  [] Dizziness  [] Blackouts   [] Seizures   [] History of stroke   [] History of TIA  [] Aphasia   [] Temporary blindness   [] Dysphagia   [] Weakness or numbness in arms   [] Weakness or numbness in legs Musculoskeletal:  [x] Arthritis   [] Joint swelling   [x] Joint pain   [] Low back pain Hematologic:  [] Easy bruising  [] Easy bleeding   [] Hypercoagulable state   [] Anemic   Gastrointestinal:  [] Blood in stool   [] Vomiting blood  [] Gastroesophageal reflux/heartburn   [] Abdominal pain Genitourinary:  [x] Chronic kidney disease   [] Difficult urination  [] Frequent urination  [] Burning with urination   [] Hematuria Skin:  [] Rashes   [] Ulcers   [] Wounds Psychological:  [x] History of anxiety   []  History of major depression.  Physical Examination  BP (!) 160/73 (BP Location: Left Arm, Patient Position: Sitting)   Pulse 70  Gen:  WD/WN, NAD Head: [place]/AT, No temporalis wasting. Ear/Nose/Throat: Hearing grossly intact, nares w/o erythema or drainage Eyes: Conjunctiva clear. Sclera non-icteric Neck: Supple.  Trachea midline Pulmonary:  Good air movement, no use of accessory muscles.  Cardiac: RRR, no JVD Vascular:  Vessel Right Left  Radial Palpable Palpable                                   Gastrointestinal: soft, non-tender/non-distended. No guarding/reflex.  Musculoskeletal: M/S 5/5 throughout.  No deformity or  atrophy. Mild LE edema. Neurologic: Sensation grossly intact in extremities.  Symmetrical.  Speech is fluent.  Psychiatric: Judgment intact, Mood & affect appropriate for pt's clinical situation. Dermatologic: No rashes or ulcers noted.  No cellulitis or open wounds.      Labs Recent Results (from the past 2160 hour(s))  CBC with Differential     Status: Abnormal   Collection Time: 11/11/22  1:06 PM  Result Value Ref Range   WBC 13.0 (H) 4.0 - 10.5 K/uL   RBC 2.69 (L) 4.22 - 5.81 MIL/uL   Hemoglobin 8.9 (L) 13.0 - 17.0 g/dL   HCT 16.1 (L) 09.6 - 04.5 %   MCV 102.6 (H) 80.0 - 100.0 fL   MCH 33.1 26.0 - 34.0 pg   MCHC 32.2 30.0 - 36.0 g/dL   RDW 40.9 81.1 - 91.4 %   Platelets 42 (L) 150 - 400 K/uL   nRBC 0.0 0.0 - 0.2 %   Neutrophils Relative % 25 %   Neutro Abs 3.2 1.7 - 7.7 K/uL   Lymphocytes Relative 66 %   Lymphs Abs 8.5 (H) 0.7 - 4.0 K/uL   Monocytes Relative 8 %   Monocytes Absolute 1.1 (H) 0.1 - 1.0 K/uL   Eosinophils Relative 1 %   Eosinophils Absolute 0.2 0.0 - 0.5 K/uL   Basophils Relative 0 %   Basophils Absolute 0.0 0.0 - 0.1 K/uL   WBC Morphology SMUDGE CELLS     Comment: DIFF. CONFIRMED BY SMEAR   RBC Morphology MORPHOLOGY UNREMARKABLE    Smear Review Normal platelet morphology     Comment: PLATELETS APPEAR DECREASED   Immature Granulocytes 0 %   Abs Immature Granulocytes 0.01 0.00 - 0.07 K/uL   Tear Drop Cells PRESENT     Comment: Performed at [facility], [address], [place], [place] [address]  CBC with Differential  Status: Abnormal   Collection Time: 12/12/22  1:28 PM  Result Value Ref Range   WBC 14.7 (H) 4.0 - 10.5 K/uL   RBC 2.86 (L) 4.22 - 5.81 MIL/uL   Hemoglobin 9.2 (L) 13.0 - 17.0 g/dL   HCT 16.1 (L) 09.6 - 04.5 %   MCV 103.1 (H) 80.0 - 100.0 fL   MCH 32.2 26.0 - 34.0 pg   MCHC 31.2 30.0 - 36.0 g/dL   RDW 40.9 81.1 - 91.4 %   Platelets 57 (L) 150 - 400 K/uL   nRBC 0.0 0.0 - 0.2 %   Neutrophils Relative % 20 %   Neutro Abs  3.0 1.7 - 7.7 K/uL   Lymphocytes Relative 70 %   Lymphs Abs 10.3 (H) 0.7 - 4.0 K/uL   Monocytes Relative 9 %   Monocytes Absolute 1.3 (H) 0.1 - 1.0 K/uL   Eosinophils Relative 1 %   Eosinophils Absolute 0.1 0.0 - 0.5 K/uL   Basophils Relative 0 %   Basophils Absolute 0.0 0.0 - 0.1 K/uL   WBC Morphology SMUDGE CELLS     Comment: DIFF. CONFIRMED BY SMEAR   RBC Morphology MORPHOLOGY UNREMARKABLE    Smear Review Normal platelet morphology     Comment: PLATELETS APPEAR DECREASED PLATELET COUNT CONFIRMED BY SMEAR    Immature Granulocytes 0 %   Abs Immature Granulocytes 0.02 0.00 - 0.07 K/uL    Comment: Performed at [facility], [address] [place], [place] [address]  Basic metabolic panel     Status: Abnormal   Collection Time: 12/14/22  4:12 PM  Result Value Ref Range   Sodium 137 135 - 145 mmol/L   Potassium 4.2 3.5 - 5.1 mmol/L   Chloride 109 98 - 111 mmol/L   CO2 21 (L) 22 - 32 mmol/L   Glucose, Bld 131 (H) 70 - 99 mg/dL    Comment: Glucose reference range applies only to samples taken after fasting for at least 8 hours.   BUN 50 (H) 8 - 23 mg/dL   Creatinine, Ser 6.21 (H) 0.61 - 1.24 mg/dL   Calcium 8.4 (L) 8.9 - 10.3 mg/dL   GFR, Estimated 23 (L) >60 mL/min    Comment: (NOTE) Calculated using the CKD-EPI Creatinine Equation (2021)    Anion gap 7 5 - 15    Comment: Performed at [facility], [address] [place], [place] [address]  CBC     Status: Abnormal   Collection Time: 12/14/22  4:12 PM  Result Value Ref Range   WBC 12.8 (H) 4.0 - 10.5 K/uL   RBC 2.75 (L) 4.22 - 5.81 MIL/uL   Hemoglobin 8.9 (L) 13.0 - 17.0 g/dL   HCT 78.4 (L) 69.6 - 29.5 %   MCV 103.3 (H) 80.0 - 100.0 fL   MCH 32.4 26.0 - 34.0 pg   MCHC 31.3 30.0 - 36.0 g/dL   RDW 28.4 13.2 - 44.0 %   Platelets 58 (L) 150 - 400 K/uL    Comment: Immature Platelet Fraction may be clinically indicated, consider ordering this additional test [id number]    nRBC 0.0 0.0 - 0.2 %    Comment:  Performed at [facility], [address] [place], [place] [address]  Procalcitonin     Status: None   Collection Time: 12/14/22  4:12 PM  Result Value Ref Range   Procalcitonin <0.10 ng/mL    Comment:        Interpretation: PCT (Procalcitonin) <= 0.5 ng/mL: Systemic infection (  sepsis) is not likely. Local bacterial infection is possible. (NOTE)       Sepsis PCT Algorithm           Lower Respiratory Tract                                      Infection PCT Algorithm    ----------------------------     ----------------------------         PCT < 0.25 ng/mL                PCT < 0.10 ng/mL          Strongly encourage             Strongly discourage   discontinuation of antibiotics    initiation of antibiotics    ----------------------------     -----------------------------       PCT 0.25 - 0.50 ng/mL            PCT 0.10 - 0.25 ng/mL               OR       >80% decrease in PCT            Discourage initiation of                                            antibiotics      Encourage discontinuation           of antibiotics    ----------------------------     -----------------------------         PCT >= 0.50 ng/mL              PCT 0.26 - 0.50 ng/mL               AND        <80% decrease in PCT             Encourage initiation of                                             antibiotics       Encourage continuation           of antibiotics    ----------------------------     -----------------------------        PCT >= 0.50 ng/mL                  PCT > 0.50 ng/mL               AND         increase in PCT                  Strongly encourage                                      initiation of antibiotics    Strongly encourage escalation           of antibiotics                                     -----------------------------  PCT <= 0.25 ng/mL                                                 OR                                        > 80%  decrease in PCT                                      Discontinue / Do not initiate                                             antibiotics  Performed at [facility], [address] [place], [place] [address]   Urinalysis, Routine w reflex microscopic -Urine, Clean Catch     Status: Abnormal   Collection Time: 12/14/22  6:45 PM  Result Value Ref Range   Color, Urine STRAW (A) YELLOW   APPearance CLEAR (A) CLEAR   Specific Gravity, Urine 1.007 1.005 - 1.030   pH 5.0 5.0 - 8.0   Glucose, UA >=500 (A) NEGATIVE mg/dL   Hgb urine dipstick NEGATIVE NEGATIVE   Bilirubin Urine NEGATIVE NEGATIVE   Ketones, ur NEGATIVE NEGATIVE mg/dL   Protein, ur 604 (A) NEGATIVE mg/dL   Nitrite NEGATIVE NEGATIVE   Leukocytes,Ua NEGATIVE NEGATIVE   RBC / HPF 0-5 0 - 5 RBC/hpf   WBC, UA 0-5 0 - 5 WBC/hpf   Bacteria, UA NONE SEEN NONE SEEN   Squamous Epithelial / HPF NONE SEEN 0 - 5 /HPF    Comment: Performed at [facility], [address] [place], [place] [address]  Hemoglobin A1c     Status: Abnormal   Collection Time: 12/15/22  3:46 AM  Result Value Ref Range   Hgb A1c MFr Bld 6.3 (H) 4.8 - 5.6 %    Comment: (NOTE)         Prediabetes: 5.7 - 6.4         Diabetes: >6.4         Glycemic control for adults with diabetes: <7.0    Mean Plasma Glucose 134 mg/dL    Comment: (NOTE) Performed At: [facility] [address] [place], [place] [id number] Jolene Schimke MD FA:[id number]   Folate     Status: None   Collection Time: 12/15/22  3:46 AM  Result Value Ref Range   Folate 20.6 >5.9 ng/mL    Comment: Performed at [facility], [address] [place], [place] [address]  Iron and TIBC     Status: None   Collection Time: 12/15/22  3:46 AM  Result Value Ref Range   Iron 67 45 - 182 ug/dL   TIBC 528 413 - 244 ug/dL   Saturation Ratios 27 17.9 - 39.5 %   UIBC 184 ug/dL    Comment: Performed at [facility], [address] [place], [place]  [address]  Phosphorus     Status: None   Collection Time: 12/15/22  3:46 AM  Result Value Ref Range   Phosphorus 4.0  2.5 - 4.6 mg/dL    Comment: Performed at [facility], [address] [place], [place] [address]  Comprehensive metabolic panel     Status: Abnormal   Collection Time: 12/15/22  3:48 AM  Result Value Ref Range   Sodium 140 135 - 145 mmol/L   Potassium 4.1 3.5 - 5.1 mmol/L   Chloride 116 (H) 98 - 111 mmol/L   CO2 17 (L) 22 - 32 mmol/L   Glucose, Bld 83 70 - 99 mg/dL    Comment: Glucose reference range applies only to samples taken after fasting for at least 8 hours.   BUN 50 (H) 8 - 23 mg/dL   Creatinine, Ser 1.302.43 (H) 0.61 - 1.24 mg/dL   Calcium 8.2 (L) 8.9 - 10.3 mg/dL   Total Protein 4.6 (L) 6.5 - 8.1 g/dL   Albumin 2.8 (L) 3.5 - 5.0 g/dL   AST 15 15 - 41 U/L   ALT 11 0 - 44 U/L   Alkaline Phosphatase 91 38 - 126 U/L   Total Bilirubin 0.5 0.3 - 1.2 mg/dL   GFR, Estimated 25 (L) >60 mL/min    Comment: (NOTE) Calculated using the CKD-EPI Creatinine Equation (2021)    Anion gap 7 5 - 15    Comment: Performed at [facility], [address] [place], [place] [address]  CBC with Differential/Platelet     Status: Abnormal   Collection Time: 12/15/22  3:48 AM  Result Value Ref Range   WBC 13.0 (H) 4.0 - 10.5 K/uL   RBC 2.64 (L) 4.22 - 5.81 MIL/uL   Hemoglobin 8.5 (L) 13.0 - 17.0 g/dL   HCT 46.926.8 (L) 62.939.0 - 52.852.0 %   MCV 101.5 (H) 80.0 - 100.0 fL   MCH 32.2 26.0 - 34.0 pg   MCHC 31.7 30.0 - 36.0 g/dL   RDW 41.314.9 24.411.5 - 01.015.5 %   Platelets 57 (L) 150 - 400 K/uL    Comment: Immature Platelet Fraction may be clinically indicated, consider ordering this additional test [id number]    nRBC 0.0 0.0 - 0.2 %   Neutrophils Relative % 17 %   Neutro Abs 2.2 1.7 - 7.7 K/uL   Lymphocytes Relative 74 %   Lymphs Abs 9.6 (H) 0.7 - 4.0 K/uL   Monocytes Relative 8 %   Monocytes Absolute 1.0 0.1 - 1.0 K/uL   Eosinophils Relative 1 %   Eosinophils Absolute 0.1 0.0  - 0.5 K/uL   Basophils Relative 0 %   Basophils Absolute 0.0 0.0 - 0.1 K/uL   WBC Morphology MORPHOLOGY UNREMARKABLE    RBC Morphology MIXED RBC MORPHOLOGY    Smear Review Normal platelet morphology    Immature Granulocytes 0 %   Abs Immature Granulocytes 0.02 0.00 - 0.07 K/uL   Smudge Cells PRESENT     Comment: Performed at [facility], [name] [place], [place] [address]  Magnesium     Status: None   Collection Time: 12/15/22  3:48 AM  Result Value Ref Range   Magnesium 1.8 1.7 - 2.4 mg/dL    Comment: Performed at [facility], [address] [place], [place] [address]  Procalcitonin     Status: None   Collection Time: 12/15/22  3:48 AM  Result Value Ref Range   Procalcitonin <0.10 ng/mL    Comment:        Interpretation: PCT (Procalcitonin) <= 0.5 ng/mL: Systemic infection (sepsis) is not likely. Local bacterial infection is possible. (NOTE)  Sepsis PCT Algorithm           Lower Respiratory Tract                                      Infection PCT Algorithm    ----------------------------     ----------------------------         PCT < 0.25 ng/mL                PCT < 0.10 ng/mL          Strongly encourage             Strongly discourage   discontinuation of antibiotics    initiation of antibiotics    ----------------------------     -----------------------------       PCT 0.25 - 0.50 ng/mL            PCT 0.10 - 0.25 ng/mL               OR       >80% decrease in PCT            Discourage initiation of                                            antibiotics      Encourage discontinuation           of antibiotics    ----------------------------     -----------------------------         PCT >= 0.50 ng/mL              PCT 0.26 - 0.50 ng/mL               AND        <80% decrease in PCT             Encourage initiation of                                             antibiotics       Encourage continuation           of antibiotics     ----------------------------     -----------------------------        PCT >= 0.50 ng/mL                  PCT > 0.50 ng/mL               AND         increase in PCT                  Strongly encourage                                      initiation of antibiotics    Strongly encourage escalation           of antibiotics                                     -----------------------------  PCT <= 0.25 ng/mL                                                 OR                                        > 80% decrease in PCT                                      Discontinue / Do not initiate                                             antibiotics  Performed at [facility], [address] [place], [place] [address]   Glucose, capillary     Status: None   Collection Time: 12/15/22  6:16 AM  Result Value Ref Range   Glucose-Capillary 81 70 - 99 mg/dL    Comment: Glucose reference range applies only to samples taken after fasting for at least 8 hours.  Glucose, capillary     Status: Abnormal   Collection Time: 12/15/22  7:36 AM  Result Value Ref Range   Glucose-Capillary 126 (H) 70 - 99 mg/dL    Comment: Glucose reference range applies only to samples taken after fasting for at least 8 hours.  Urine Culture (for pregnant, neutropenic or urologic patients or patients with an indwelling urinary catheter)     Status: None   Collection Time: 12/15/22  8:30 AM   Specimen: Urine, Clean Catch  Result Value Ref Range   Specimen Description      URINE, CLEAN CATCH Performed at [facility], [address], [place], [place] [address]    Special Requests      NONE Performed at [facility], [address], [place], [place] [address]    Culture      NO GROWTH Performed at [facility], [address], [place], [place] [address]    Report Status 12/16/2022 FINAL   VITAMIN D 25 Hydroxy (Vit-D Deficiency, Fractures)      Status: None   Collection Time: 12/15/22  8:47 AM  Result Value Ref Range   Vit D, 25-Hydroxy 69.10 30 - 100 ng/mL    Comment: (NOTE) Vitamin D deficiency has been defined by the Institute of Medicine  and an Endocrine Society practice guideline as a level of serum 25-OH  vitamin D less than 20 ng/mL (1,2). The Endocrine Society went on to  further define vitamin D insufficiency as a level between 21 and 29  ng/mL (2).  1. IOM (Institute of Medicine). 2010. Dietary reference intakes for  calcium and D. [place]: [employer]. 2. Holick MF, Binkley National, Bischoff-Ferrari HA, et al. Evaluation,  treatment, and prevention of vitamin D deficiency: an Endocrine  Society clinical practice guideline, JCEM. 2011 Jul; 96(7): 1911-30.  Performed at [facility], [address] [place], [place] [address]   C Difficile Quick Screen w PCR reflex     Status: None   Collection Time: 12/15/22  9:05 AM   Specimen: STOOL  Result Value Ref Range   C Diff antigen NEGATIVE NEGATIVE   C Diff toxin NEGATIVE NEGATIVE   C Diff interpretation No C. difficile detected.     Comment: Performed at [facility], [address] [place], [place] [address]  Gastrointestinal Panel by PCR , Stool     Status: None   Collection Time: 12/15/22  9:05 AM   Specimen: Stool  Result Value Ref Range   Campylobacter species NOT DETECTED NOT DETECTED   Plesimonas shigelloides NOT DETECTED NOT DETECTED   Salmonella species NOT DETECTED NOT DETECTED   Yersinia enterocolitica NOT DETECTED NOT DETECTED   Vibrio species NOT DETECTED NOT DETECTED   Vibrio cholerae NOT DETECTED NOT DETECTED   Enteroaggregative E coli (EAEC) NOT DETECTED NOT DETECTED   Enteropathogenic E coli (EPEC) NOT DETECTED NOT DETECTED   Enterotoxigenic E coli (ETEC) NOT DETECTED NOT DETECTED   Shiga like toxin producing E coli (STEC) NOT DETECTED NOT DETECTED   Shigella/Enteroinvasive E coli (EIEC) NOT DETECTED NOT  DETECTED   Cryptosporidium NOT DETECTED NOT DETECTED   Cyclospora cayetanensis NOT DETECTED NOT DETECTED   Entamoeba histolytica NOT DETECTED NOT DETECTED   Giardia lamblia NOT DETECTED NOT DETECTED   Adenovirus F40/41 NOT DETECTED NOT DETECTED   Astrovirus NOT DETECTED NOT DETECTED   Norovirus GI/GII NOT DETECTED NOT DETECTED   Rotavirus A NOT DETECTED NOT DETECTED   Sapovirus (I, II, IV, and V) NOT DETECTED NOT DETECTED    Comment: Performed at [facility] [facility], [address] [place], [place] [address]  Glucose, capillary     Status: Abnormal   Collection Time: 12/15/22 12:20 PM  Result Value Ref Range   Glucose-Capillary 103 (H) 70 - 99 mg/dL    Comment: Glucose reference range applies only to samples taken after fasting for at least 8 hours.  Glucose, capillary     Status: Abnormal   Collection Time: 12/15/22  4:54 PM  Result Value Ref Range   Glucose-Capillary 124 (H) 70 - 99 mg/dL    Comment: Glucose reference range applies only to samples taken after fasting for at least 8 hours.  Glucose, capillary     Status: Abnormal   Collection Time: 12/15/22  9:15 PM  Result Value Ref Range   Glucose-Capillary 113 (H) 70 - 99 mg/dL    Comment: Glucose reference range applies only to samples taken after fasting for at least 8 hours.  Glucose, capillary     Status: Abnormal   Collection Time: 12/16/22  1:18 AM  Result Value Ref Range   Glucose-Capillary 119 (H) 70 - 99 mg/dL    Comment: Glucose reference range applies only to samples taken after fasting for at least 8 hours.  Magnesium     Status: None   Collection Time: 12/16/22  5:59 AM  Result Value Ref Range   Magnesium 1.9 1.7 - 2.4 mg/dL    Comment: Performed at [facility], [address] [place], [place] [address]  Phosphorus     Status: None   Collection Time: 12/16/22  5:59 AM  Result Value Ref Range   Phosphorus 3.7 2.5 - 4.6 mg/dL    Comment: Performed at [facility], [address]  [place], [place] [address]  Basic metabolic panel     Status: Abnormal   Collection Time: 12/16/22  5:59 AM  Result Value Ref Range   Sodium 138 135 - 145 mmol/L   Potassium 4.2 3.5 -  5.1 mmol/L   Chloride 111 98 - 111 mmol/L   CO2 20 (L) 22 - 32 mmol/L   Glucose, Bld 113 (H) 70 - 99 mg/dL    Comment: Glucose reference range applies only to samples taken after fasting for at least 8 hours.   BUN 42 (H) 8 - 23 mg/dL   Creatinine, Ser 4.092.35 (H) 0.61 - 1.24 mg/dL   Calcium 8.0 (L) 8.9 - 10.3 mg/dL   GFR, Estimated 26 (L) >60 mL/min    Comment: (NOTE) Calculated using the CKD-EPI Creatinine Equation (2021)    Anion gap 7 5 - 15    Comment: Performed at [facility], [address] [place], [place] [address]  CBC     Status: Abnormal   Collection Time: 12/16/22  5:59 AM  Result Value Ref Range   WBC 10.4 4.0 - 10.5 K/uL   RBC 2.59 (L) 4.22 - 5.81 MIL/uL   Hemoglobin 8.4 (L) 13.0 - 17.0 g/dL   HCT 47.826.5 (L) 29.539.0 - 62.152.0 %   MCV 102.3 (H) 80.0 - 100.0 fL   MCH 32.4 26.0 - 34.0 pg   MCHC 31.7 30.0 - 36.0 g/dL   RDW 30.815.0 65.711.5 - 84.615.5 %   Platelets 41 (L) 150 - 400 K/uL    Comment: Immature Platelet Fraction may be clinically indicated, consider ordering this additional test [id number]    nRBC 0.0 0.0 - 0.2 %    Comment: Performed at [facility], [address] [place], [place] [address]  Glucose, capillary     Status: Abnormal   Collection Time: 12/16/22  7:51 AM  Result Value Ref Range   Glucose-Capillary 129 (H) 70 - 99 mg/dL    Comment: Glucose reference range applies only to samples taken after fasting for at least 8 hours.   Comment 1 Notify RN   Glucose, capillary     Status: Abnormal   Collection Time: 12/16/22 11:41 AM  Result Value Ref Range   Glucose-Capillary 190 (H) 70 - 99 mg/dL    Comment: Glucose reference range applies only to samples taken after fasting for at least 8 hours.   Comment 1 Notify RN   Glucose, capillary     Status: Abnormal    Collection Time: 12/16/22  4:12 PM  Result Value Ref Range   Glucose-Capillary 131 (H) 70 - 99 mg/dL    Comment: Glucose reference range applies only to samples taken after fasting for at least 8 hours.   Comment 1 Notify RN   Glucose, capillary     Status: Abnormal   Collection Time: 12/16/22  7:52 PM  Result Value Ref Range   Glucose-Capillary 126 (H) 70 - 99 mg/dL    Comment: Glucose reference range applies only to samples taken after fasting for at least 8 hours.  Magnesium     Status: None   Collection Time: 12/17/22  4:17 AM  Result Value Ref Range   Magnesium 1.8 1.7 - 2.4 mg/dL    Comment: Performed at [facility], [address] [place], [place] [address]  Phosphorus     Status: None   Collection Time: 12/17/22  4:17 AM  Result Value Ref Range   Phosphorus 3.5 2.5 - 4.6 mg/dL    Comment: Performed at [facility], [address] [place], [place] [address]  Basic metabolic panel     Status: Abnormal   Collection Time: 12/17/22  4:17 AM  Result Value Ref Range   Sodium 140 135 -  145 mmol/L   Potassium 4.1 3.5 - 5.1 mmol/L   Chloride 110 98 - 111 mmol/L   CO2 23 22 - 32 mmol/L   Glucose, Bld 95 70 - 99 mg/dL    Comment: Glucose reference range applies only to samples taken after fasting for at least 8 hours.   BUN 34 (H) 8 - 23 mg/dL   Creatinine, Ser 6.96 (H) 0.61 - 1.24 mg/dL   Calcium 7.9 (L) 8.9 - 10.3 mg/dL   GFR, Estimated 29 (L) >60 mL/min    Comment: (NOTE) Calculated using the CKD-EPI Creatinine Equation (2021)    Anion gap 7 5 - 15    Comment: Performed at [facility], [address] [place], [place] [address]  CBC     Status: Abnormal   Collection Time: 12/17/22  4:17 AM  Result Value Ref Range   WBC 12.7 (H) 4.0 - 10.5 K/uL   RBC 2.52 (L) 4.22 - 5.81 MIL/uL   Hemoglobin 8.1 (L) 13.0 - 17.0 g/dL   HCT 41.3 (L) 24.4 - 01.0 %   MCV 103.6 (H) 80.0 - 100.0 fL   MCH 32.1 26.0 - 34.0 pg   MCHC 31.0 30.0 - 36.0 g/dL   RDW  27.2 53.6 - 64.4 %   Platelets 51 (L) 150 - 400 K/uL    Comment: Immature Platelet Fraction may be clinically indicated, consider ordering this additional test [id number]    nRBC 0.0 0.0 - 0.2 %    Comment: Performed at [facility], [address] [place], [place] [address]  C-reactive protein     Status: None   Collection Time: 12/17/22  4:17 AM  Result Value Ref Range   CRP <0.5 <1.0 mg/dL    Comment: Performed at [facility], [address] [place], [place] [address]  Glucose, capillary     Status: None   Collection Time: 12/17/22  7:58 AM  Result Value Ref Range   Glucose-Capillary 94 70 - 99 mg/dL    Comment: Glucose reference range applies only to samples taken after fasting for at least 8 hours.  Glucose, capillary     Status: Abnormal   Collection Time: 12/17/22 12:08 PM  Result Value Ref Range   Glucose-Capillary 130 (H) 70 - 99 mg/dL    Comment: Glucose reference range applies only to samples taken after fasting for at least 8 hours.  Glucose, capillary     Status: Abnormal   Collection Time: 12/17/22  4:21 PM  Result Value Ref Range   Glucose-Capillary 161 (H) 70 - 99 mg/dL    Comment: Glucose reference range applies only to samples taken after fasting for at least 8 hours.  Glucose, capillary     Status: Abnormal   Collection Time: 12/17/22  8:31 PM  Result Value Ref Range   Glucose-Capillary 113 (H) 70 - 99 mg/dL    Comment: Glucose reference range applies only to samples taken after fasting for at least 8 hours.  Magnesium     Status: None   Collection Time: 12/18/22  5:32 AM  Result Value Ref Range   Magnesium 1.8 1.7 - 2.4 mg/dL    Comment: Performed at [facility], [address] [place], [place] [address]  Phosphorus     Status: None   Collection Time: 12/18/22  5:32 AM  Result Value Ref Range   Phosphorus 3.2 2.5 - 4.6 mg/dL    Comment: Performed at [facility], [address] [place],  [place] [address]  Basic  metabolic panel     Status: Abnormal   Collection Time: 12/18/22  5:32 AM  Result Value Ref Range   Sodium 140 135 - 145 mmol/L   Potassium 4.2 3.5 - 5.1 mmol/L   Chloride 110 98 - 111 mmol/L   CO2 23 22 - 32 mmol/L   Glucose, Bld 97 70 - 99 mg/dL    Comment: Glucose reference range applies only to samples taken after fasting for at least 8 hours.   BUN 29 (H) 8 - 23 mg/dL   Creatinine, Ser 6.04 (H) 0.61 - 1.24 mg/dL   Calcium 7.6 (L) 8.9 - 10.3 mg/dL   GFR, Estimated 31 (L) >60 mL/min    Comment: (NOTE) Calculated using the CKD-EPI Creatinine Equation (2021)    Anion gap 7 5 - 15    Comment: Performed at [facility], [address] [place], [place] [address]  CBC     Status: Abnormal   Collection Time: 12/18/22  5:32 AM  Result Value Ref Range   WBC 14.0 (H) 4.0 - 10.5 K/uL   RBC 2.53 (L) 4.22 - 5.81 MIL/uL   Hemoglobin 8.2 (L) 13.0 - 17.0 g/dL   HCT 11.9 (L) 14.7 - 82.9 %   MCV 103.6 (H) 80.0 - 100.0 fL   MCH 32.4 26.0 - 34.0 pg   MCHC 31.3 30.0 - 36.0 g/dL   RDW 56.2 13.0 - 86.5 %   Platelets 46 (L) 150 - 400 K/uL    Comment: Immature Platelet Fraction may be clinically indicated, consider ordering this additional test [id number]    nRBC 0.0 0.0 - 0.2 %    Comment: Performed at [facility], [address] [place], [place] [address]  C-reactive protein     Status: None   Collection Time: 12/18/22  5:32 AM  Result Value Ref Range   CRP <0.5 <1.0 mg/dL    Comment: Performed at [facility], [address] [place], [place] [address]  Glucose, capillary     Status: Abnormal   Collection Time: 12/18/22  9:54 AM  Result Value Ref Range   Glucose-Capillary 155 (H) 70 - 99 mg/dL    Comment: Glucose reference range applies only to samples taken after fasting for at least 8 hours.  Glucose, capillary     Status: Abnormal   Collection Time: 12/18/22 11:44 AM  Result Value Ref Range   Glucose-Capillary 121 (H) 70 - 99 mg/dL    Comment: Glucose  reference range applies only to samples taken after fasting for at least 8 hours.  Glucose, capillary     Status: None   Collection Time: 12/18/22  3:45 PM  Result Value Ref Range   Glucose-Capillary 87 70 - 99 mg/dL    Comment: Glucose reference range applies only to samples taken after fasting for at least 8 hours.  Glucose, capillary     Status: Abnormal   Collection Time: 12/18/22  9:07 PM  Result Value Ref Range   Glucose-Capillary 109 (H) 70 - 99 mg/dL    Comment: Glucose reference range applies only to samples taken after fasting for at least 8 hours.  Basic metabolic panel     Status: Abnormal   Collection Time: 12/19/22  4:10 AM  Result Value Ref Range   Sodium 136 135 - 145 mmol/L   Potassium 3.7 3.5 - 5.1 mmol/L   Chloride 106 98 - 111 mmol/L   CO2 24 22 - 32 mmol/L  Glucose, Bld 80 70 - 99 mg/dL    Comment: Glucose reference range applies only to samples taken after fasting for at least 8 hours.   BUN 26 (H) 8 - 23 mg/dL   Creatinine, Ser 7.74 (H) 0.61 - 1.24 mg/dL   Calcium 7.2 (L) 8.9 - 10.3 mg/dL   GFR, Estimated 31 (L) >60 mL/min    Comment: (NOTE) Calculated using the CKD-EPI Creatinine Equation (2021)    Anion gap 6 5 - 15    Comment: Performed at [facility], [address] [place], [place] [address]  CBC     Status: Abnormal   Collection Time: 12/19/22  4:10 AM  Result Value Ref Range   WBC 13.9 (H) 4.0 - 10.5 K/uL   RBC 2.38 (L) 4.22 - 5.81 MIL/uL   Hemoglobin 7.7 (L) 13.0 - 17.0 g/dL   HCT 53.2 (L) 02.3 - 34.3 %   MCV 103.4 (H) 80.0 - 100.0 fL   MCH 32.4 26.0 - 34.0 pg   MCHC 31.3 30.0 - 36.0 g/dL   RDW 56.8 61.6 - 83.7 %   Platelets 39 (L) 150 - 400 K/uL    Comment: Immature Platelet Fraction may be clinically indicated, consider ordering this additional test [id number]    nRBC 0.0 0.0 - 0.2 %    Comment: Performed at [facility], [address] [place], [place] [address]  Glucose, capillary     Status: None   Collection  Time: 12/19/22  7:42 AM  Result Value Ref Range   Glucose-Capillary 78 70 - 99 mg/dL    Comment: Glucose reference range applies only to samples taken after fasting for at least 8 hours.  Glucose, capillary     Status: Abnormal   Collection Time: 12/19/22 11:31 AM  Result Value Ref Range   Glucose-Capillary 122 (H) 70 - 99 mg/dL    Comment: Glucose reference range applies only to samples taken after fasting for at least 8 hours.  CBC with Differential (Cancer Center Only)     Status: Abnormal   Collection Time: 01/10/23 12:40 PM  Result Value Ref Range   WBC Count 9.4 4.0 - 10.5 K/uL   RBC 2.57 (L) 4.22 - 5.81 MIL/uL   Hemoglobin 8.4 (L) 13.0 - 17.0 g/dL   HCT 22.3 (L) 36.1 - 22.4 %   MCV 105.1 (H) 80.0 - 100.0 fL   MCH 32.7 26.0 - 34.0 pg   MCHC 31.1 30.0 - 36.0 g/dL   RDW 49.7 53.0 - 05.1 %   Platelet Count 43 (L) 150 - 400 K/uL   nRBC 0.0 0.0 - 0.2 %   Neutrophils Relative % 10 %   Neutro Abs 1.0 (L) 1.7 - 7.7 K/uL   Lymphocytes Relative 78 %   Lymphs Abs 7.2 (H) 0.7 - 4.0 K/uL   Monocytes Relative 11 %   Monocytes Absolute 1.0 0.1 - 1.0 K/uL   Eosinophils Relative 1 %   Eosinophils Absolute 0.1 0.0 - 0.5 K/uL   Basophils Relative 0 %   Basophils Absolute 0.0 0.0 - 0.1 K/uL   WBC Morphology ABSOLUTE LYMPHOCYTOSIS     Comment: DIFF CONFIRMED BY MANUAL.   RBC Morphology UNREMARKABLE    Smear Review Normal platelet morphology     Comment: PLATELETS APPEAR DECREASED   Immature Granulocytes 0 %   Abs Immature Granulocytes 0.00 0.00 - 0.07 K/uL    Comment: Performed at [facility], [address] [place], [place] [address]  Radiology CT Renal Stone Study  Result Date: 12/14/2022 CLINICAL DATA:  Abdominal pain EXAM: CT ABDOMEN AND PELVIS WITHOUT CONTRAST TECHNIQUE: Multidetector CT imaging of the abdomen and pelvis was performed following the standard protocol without IV contrast. RADIATION DOSE REDUCTION: This exam was performed according to the departmental  dose-optimization program which includes automated exposure control, adjustment of the mA and/or kV according to patient size and/or use of iterative reconstruction technique. COMPARISON:  CT abdomen/pelvis dated 04/09/2017. Multiple renal ultrasounds, most recently 05/23/2022. FINDINGS: Lower chest: Patchy opacity with peribronchovascular nodularity in the right lower lobe, suspicious for pneumonia. Hepatobiliary: Unenhanced liver is unremarkable. Status post cholecystectomy. No intrahepatic or extrahepatic dilatation. Pancreas: Within normal limits. Spleen: Within normal limits. Adrenals/Urinary Tract: Adrenal glands are within normal limits. Left kidney is notable for a left upper pole renal sinus cyst and an 18 mm simple exophytic cyst along the left lower kidney (series 2/image 41), benign. No follow-up is recommended. Mild left hydroureteronephrosis, new. Mild periureteral stranding (series 2/image 39) raises the possibility of infection. 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 No hydronephrosis. Bladder is underdistended but unremarkable. Stomach/Bowel: Stomach is within normal limits. No evidence of bowel obstruction. Normal appendix (series 2/image 53). Mild left colonic diverticulosis, without evidence of diverticulitis. Vascular/Lymphatic: 5.0 x 5.0 cm infrarenal abdominal aortic aneurysm, previously 3.9 x 3.8 cm in 2018. Atherosclerotic calcifications of the abdominal aorta and branch vessels. No suspicious abdominopelvic lymphadenopathy. Reproductive: Prostate is unremarkable. Other: No abdominopelvic ascites. Small fat containing midline supraumbilical hernia (series 2/image 44). Musculoskeletal: Degenerative changes of the visualized thoracolumbar spine. IMPRESSION: Mild left hydroureteronephrosis, new. Associated mild periureteral stranding raises the  possibility of infection. Patchy opacity with peribronchovascular nodularity in the right lower lobe, suspicious for pneumonia. 5.0 x 5.0 cm infrarenal abdominal aortic aneurysm, previously 3.9 x 3.8 cm in 2018. Recommend follow-up every 6 months and vascular consultation. Reference: J Am Coll Radiol 2013;10:789-794. Additional stable ancillary findings as above. Electronically Signed   By: Charline Bills M.D.   On: 12/14/2022 19:42    Assessment/Plan Diabetes mellitus (HCC) blood glucose control important in reducing the progression of atherosclerotic disease. Also, involved in wound healing. On appropriate medications.     BP (high blood pressure) blood pressure control important in reducing the progression of atherosclerotic disease and aneurysmal disease. On appropriate oral medications.   Chronic kidney disease (CKD), stage III (moderate) Hydrate and limit contrast with repair.   HLD (hyperlipidemia) lipid control important in reducing the progression of atherosclerotic disease. Continue statin therapy  Carotid stenosis Carotid duplex today last fall stable 1 to 39% ICA stenosis bilaterally without significant progression from previous studies.  No increased risk of stroke.  Recheck in 1 year.  Abdominal aortic aneurysm (AAA) (HCC) The patient has had continued growth of his abdominal aortic aneurysm and it now measures 5.0 cm in maximal diameter.  This represents a 9 mm growth over the last 14 months.  5 independently reviewed his CT scan.  Although this was an uninfused scan, he has a previous diffuse scan from several years ago and the aneurysm was smaller.  He appears to have favorable anatomy for endovascular repair.  Given his age and comorbidities, obviously endovascular repair would be highly preferred over open surgical repair.  I discussed the risks and benefits of the procedure.  I discussed the pathophysiology and natural history of aneurysm disease.  I discussed my plan  repair generally at 5 cm.  He voices understanding and is agreeable to proceed with endovascular abdominal aortic aneurysm repair.  We generally get a CT angiogram prior to repair, but with his significant chronic kidney disease, will defer CT angiogram particularly with his favorable.  We can use intravascular ultrasound intraoperatively if needed.    Festus Barren, MD  01/13/2023 2:28 PM    This note was created with Dragon medical transcription system.  Any errors from dictation are purely unintentional

## 2023-01-19 DIAGNOSIS — Z01810 Encounter for preprocedural cardiovascular examination: Secondary | ICD-10-CM | POA: Insufficient documentation

## 2023-01-23 ENCOUNTER — Encounter: Payer: Self-pay | Admitting: Oncology

## 2023-02-09 ENCOUNTER — Telehealth: Payer: Self-pay | Admitting: *Deleted

## 2023-02-09 ENCOUNTER — Inpatient Hospital Stay: Payer: Medicare Other

## 2023-02-09 ENCOUNTER — Inpatient Hospital Stay: Payer: Medicare Other | Attending: Oncology

## 2023-02-09 VITALS — BP 142/67

## 2023-02-09 DIAGNOSIS — C9110 Chronic lymphocytic leukemia of B-cell type not having achieved remission: Secondary | ICD-10-CM

## 2023-02-09 DIAGNOSIS — N189 Chronic kidney disease, unspecified: Secondary | ICD-10-CM

## 2023-02-09 DIAGNOSIS — N1832 Chronic kidney disease, stage 3b: Secondary | ICD-10-CM | POA: Diagnosis present

## 2023-02-09 DIAGNOSIS — D631 Anemia in chronic kidney disease: Secondary | ICD-10-CM | POA: Insufficient documentation

## 2023-02-09 DIAGNOSIS — D696 Thrombocytopenia, unspecified: Secondary | ICD-10-CM

## 2023-02-09 LAB — CBC WITH DIFFERENTIAL/PLATELET
Abs Immature Granulocytes: 0 10*3/uL (ref 0.00–0.07)
Basophils Absolute: 0 10*3/uL (ref 0.0–0.1)
Basophils Relative: 0 %
Eosinophils Absolute: 0.1 10*3/uL (ref 0.0–0.5)
Eosinophils Relative: 2 %
HCT: 25.7 % — ABNORMAL LOW (ref 39.0–52.0)
Hemoglobin: 8.2 g/dL — ABNORMAL LOW (ref 13.0–17.0)
Immature Granulocytes: 0 %
Lymphocytes Relative: 80 %
Lymphs Abs: 4.9 10*3/uL — ABNORMAL HIGH (ref 0.7–4.0)
MCH: 33.3 pg (ref 26.0–34.0)
MCHC: 31.9 g/dL (ref 30.0–36.0)
MCV: 104.5 fL — ABNORMAL HIGH (ref 80.0–100.0)
Monocytes Absolute: 0.3 10*3/uL (ref 0.1–1.0)
Monocytes Relative: 4 %
Neutro Abs: 0.9 10*3/uL — ABNORMAL LOW (ref 1.7–7.7)
Neutrophils Relative %: 14 %
Platelets: 29 10*3/uL — ABNORMAL LOW (ref 150–400)
RBC: 2.46 MIL/uL — ABNORMAL LOW (ref 4.22–5.81)
RDW: 14.7 % (ref 11.5–15.5)
Smear Review: NORMAL
WBC Morphology: ABNORMAL
WBC: 6.2 10*3/uL (ref 4.0–10.5)
nRBC: 0 % (ref 0.0–0.2)

## 2023-02-09 MED ORDER — EPOETIN ALFA-EPBX 40000 UNIT/ML IJ SOLN
40000.0000 [IU] | Freq: Once | INTRAMUSCULAR | Status: AC
  Administered 2023-02-09: 40000 [IU] via SUBCUTANEOUS
  Filled 2023-02-09: qty 1

## 2023-02-09 NOTE — Telephone Encounter (Signed)
Patient came for lab/retacrit today.  Plt count 29. Patient denies any bleeding. Discussed bleeding precautions with patient. Pt does not have any f/u until 1 month to recheck labs. I personally reached out and discussed this concern with Dr. Orlie Dakin as the plt count has continued to drop. Per Dr. Orlie Dakin- verbal order given to recheck pt's blood counts-cbc in 2 weeks. I spoke with patient about the plan, he mentioned that he is tentatively planning to have aortic aneurysm repair. He asked me the ideal perimeters for surgery for plt count. I will speak to Dr. Orlie Dakin about this concern as pt is cncerned about low plt count delaying his surgery.  I informed pt and his wife that Dr. Wyn Quaker and pt's cardiologist would also need to weigh into this. I am happy to route pt's labs today to Dr. Wyn Quaker. Pt thanked me and will wait to hear back from Dr. Wyn Quaker on plt count.  Apt given on 5/16 to recheck lab only-cbc  I have given a patient hand off to [place], [place]

## 2023-02-10 ENCOUNTER — Encounter: Payer: Self-pay | Admitting: *Deleted

## 2023-02-15 ENCOUNTER — Encounter (INDEPENDENT_AMBULATORY_CARE_PROVIDER_SITE_OTHER): Payer: Self-pay | Admitting: Vascular Surgery

## 2023-02-20 ENCOUNTER — Telehealth (INDEPENDENT_AMBULATORY_CARE_PROVIDER_SITE_OTHER): Payer: Self-pay

## 2023-02-20 NOTE — Telephone Encounter (Signed)
Spoke with the patient and his spouse on speaker to schedule him for a AAA repair with Dr. Wyn Quaker for 03/01/23. Patient had a lot of questions regarding having platelets  being given and other questions in general. Patient and spouse wanted to come back in to speak with Dr. Wyn Quaker before being scheduled.

## 2023-02-23 ENCOUNTER — Inpatient Hospital Stay: Payer: Medicare Other

## 2023-02-23 DIAGNOSIS — C9110 Chronic lymphocytic leukemia of B-cell type not having achieved remission: Secondary | ICD-10-CM

## 2023-02-23 DIAGNOSIS — N1832 Chronic kidney disease, stage 3b: Secondary | ICD-10-CM | POA: Diagnosis not present

## 2023-02-23 DIAGNOSIS — D631 Anemia in chronic kidney disease: Secondary | ICD-10-CM

## 2023-02-23 DIAGNOSIS — D696 Thrombocytopenia, unspecified: Secondary | ICD-10-CM

## 2023-02-23 LAB — CBC WITH DIFFERENTIAL (CANCER CENTER ONLY)
Abs Immature Granulocytes: 0.01 10*3/uL (ref 0.00–0.07)
Basophils Absolute: 0 10*3/uL (ref 0.0–0.1)
Basophils Relative: 0 %
Eosinophils Absolute: 0.1 10*3/uL (ref 0.0–0.5)
Eosinophils Relative: 1 %
HCT: 26.3 % — ABNORMAL LOW (ref 39.0–52.0)
Hemoglobin: 8.2 g/dL — ABNORMAL LOW (ref 13.0–17.0)
Immature Granulocytes: 0 %
Lymphocytes Relative: 78 %
Lymphs Abs: 6 10*3/uL — ABNORMAL HIGH (ref 0.7–4.0)
MCH: 33.1 pg (ref 26.0–34.0)
MCHC: 31.2 g/dL (ref 30.0–36.0)
MCV: 106 fL — ABNORMAL HIGH (ref 80.0–100.0)
Monocytes Absolute: 0.7 10*3/uL (ref 0.1–1.0)
Monocytes Relative: 9 %
Neutro Abs: 0.9 10*3/uL — ABNORMAL LOW (ref 1.7–7.7)
Neutrophils Relative %: 12 %
Platelet Count: 32 10*3/uL — ABNORMAL LOW (ref 150–400)
RBC: 2.48 MIL/uL — ABNORMAL LOW (ref 4.22–5.81)
RDW: 14.9 % (ref 11.5–15.5)
WBC Count: 7.8 10*3/uL (ref 4.0–10.5)
WBC Morphology: ABNORMAL
nRBC: 0 % (ref 0.0–0.2)

## 2023-02-28 ENCOUNTER — Ambulatory Visit (INDEPENDENT_AMBULATORY_CARE_PROVIDER_SITE_OTHER): Payer: Medicare Other | Admitting: Vascular Surgery

## 2023-02-28 ENCOUNTER — Encounter (INDEPENDENT_AMBULATORY_CARE_PROVIDER_SITE_OTHER): Payer: Self-pay | Admitting: Vascular Surgery

## 2023-02-28 VITALS — BP 136/67 | HR 60 | Resp 16 | Wt 193.6 lb

## 2023-02-28 DIAGNOSIS — I7143 Infrarenal abdominal aortic aneurysm, without rupture: Secondary | ICD-10-CM | POA: Diagnosis not present

## 2023-02-28 DIAGNOSIS — N1832 Chronic kidney disease, stage 3b: Secondary | ICD-10-CM

## 2023-02-28 DIAGNOSIS — C9110 Chronic lymphocytic leukemia of B-cell type not having achieved remission: Secondary | ICD-10-CM

## 2023-02-28 DIAGNOSIS — I10 Essential (primary) hypertension: Secondary | ICD-10-CM | POA: Diagnosis not present

## 2023-02-28 DIAGNOSIS — I6523 Occlusion and stenosis of bilateral carotid arteries: Secondary | ICD-10-CM

## 2023-02-28 DIAGNOSIS — E1122 Type 2 diabetes mellitus with diabetic chronic kidney disease: Secondary | ICD-10-CM | POA: Diagnosis not present

## 2023-02-28 DIAGNOSIS — E785 Hyperlipidemia, unspecified: Secondary | ICD-10-CM

## 2023-02-28 DIAGNOSIS — Z794 Long term (current) use of insulin: Secondary | ICD-10-CM

## 2023-02-28 NOTE — Progress Notes (Signed)
MRN : [id number]  Patrick Payne. is a 89 y.o. (06/14/1934) male who presents with chief complaint of  Chief Complaint  Patient presents with   Follow-up    Discuss surgerey  .  History of Present Illness: Patient returns today in follow up of his abdominal aortic aneurysm to discuss potential upcoming repair and many concerns that he has.  He had his recent preoperative cardiac evaluation and his heart actually checked out fairly well.  He does have significant chronic kidney disease stage IV and is very concerned about that.  He also has thrombocytopenia which has been concerning and was noted on his preoperative studies.  He is now seeing Dr. Orlie Dakin for hematology about that.  His aneurysm measured 5.0 cm on the CT scan last month.  This was a relatively large growth from approximately 4.3 cm on previous check.  He is very hesitant about surgery and would like to discuss this further  Current Outpatient Medications  Medication Sig Dispense Refill   Aflibercept 2 MG/0.05ML SOLN Inject 1 Dose into the eye as directed. One injection into left eye every 8-9 weeks     allopurinol (ZYLOPRIM) 100 MG tablet Take 100 mg by mouth daily.     atorvastatin (LIPITOR) 10 MG tablet Take 10 mg by mouth at bedtime.      BD PEN NEEDLE NANO 2ND GEN 32G X 4 MM MISC USE AS DIRECTED ONCE A DAY     carvedilol (COREG) 12.5 MG tablet Take 12.5 mg by mouth 2 (two) times daily with a meal.     Cholecalciferol (VITAMIN D-3) 5000 units TABS Take 5,000 Units by mouth at bedtime.      clonazePAM (KLONOPIN) 1 MG tablet Take 1 mg by mouth at bedtime.      CONTOUR NEXT TEST test strip daily.     cyanocobalamin 500 MCG tablet Take 1,000 mcg by mouth at bedtime.      empagliflozin (JARDIANCE) 10 MG TABS tablet 10 mg daily.     epoetin alfa-epbx (RETACRIT) 40981 UNIT/ML injection Inject 40,000 Units into the skin every 6 (six) weeks. Depending on lab results (if needed)     fexofenadine (ALLEGRA) 180 MG tablet  Take 180 mg by mouth daily as needed for allergies.     finasteride (PROSCAR) 5 MG tablet Take 1 tablet (5 mg total) by mouth daily. 90 tablet 3   folic acid (FOLVITE) 400 MCG tablet Take 400 mcg by mouth daily.     insulin glargine (LANTUS) 100 UNIT/ML injection Inject 20-30 Units into the skin every morning. Takes Lantus 20 units if CBG less than 120 mg/dl or Lantus 30 units if CBG is over 120 mg/dl     LORazepam (ATIVAN) 1 MG tablet Take 1 mg by mouth every 8 (eight) hours as needed for anxiety (takes 1 tablet every morning.  Rarely has to take again during the day).      Multiple Vitamins-Minerals (PRESERVISION AREDS 2 PO) Take 1 capsule by mouth 2 (two) times daily.      omega-3 acid ethyl esters (LOVAZA) 1 g capsule Take by mouth 2 (two) times daily.     sertraline (ZOLOFT) 50 MG tablet Take 50 mg by mouth daily.     tamsulosin (FLOMAX) 0.4 MG CAPS capsule Take 1 capsule (0.4 mg total) by mouth daily after supper. 90 capsule 3   telmisartan (MICARDIS) 40 MG tablet Take 40 mg by mouth daily.     triamcinolone (NASACORT) 55 MCG/ACT AERO  nasal inhaler Place 2 sprays into the nose 2 (two) times daily as needed (allergies).      No current facility-administered medications for this visit.   Facility-Administered Medications Ordered in Other Visits  Medication Dose Route Frequency Provider Last Rate Last Admin   epoetin alfa-epbx (RETACRIT) injection 40,000 Units  40,000 Units Subcutaneous Once Jeralyn Ruths, MD        Past Medical History:  Diagnosis Date   Anxiety    CKD (chronic kidney disease), stage III (HCC)    CLL (chronic lymphocytic leukemia) (HCC)    Diabetes mellitus without complication (HCC)    Diverticulitis 04/09/2017   History of kidney stones    HLD (hyperlipidemia)    HTN (hypertension)    PKD (polycystic kidney disease)    Sleep apnea    Uncontrolled type 2 diabetes mellitus with hyperglycemia (HCC) 06/28/2018    Past Surgical History:  Procedure Laterality  Date   CARDIAC CATHETERIZATION  12/1987   CENTRAL LINE INSERTION N/A 04/11/2017   Procedure: Central Line Insertion;  Surgeon: Renford Dills, MD;  Location: [facility];  Service: Cardiovascular;  Laterality: N/A;   CERVICAL FUSION     CHOLECYSTECTOMY     CYSTOSCOPY W/ RETROGRADES Left 05/12/2017   Procedure: CYSTOSCOPY WITH RETROGRADE PYELOGRAM;  Surgeon: Hildred Laser, MD;  Location: [facility];  Service: Urology;  Laterality: Left;   CYSTOSCOPY W/ URETERAL STENT PLACEMENT Left 05/12/2017   Procedure: CYSTOSCOPY WITH STENT REMOVAL;  Surgeon: Hildred Laser, MD;  Location: [facility];  Service: Urology;  Laterality: Left;   CYSTOSCOPY WITH STENT PLACEMENT Left 04/11/2017   Procedure: CYSTOSCOPY WITH STENT PLACEMENT;  Surgeon: Jerilee Field, MD;  Location: [facility];  Service: Urology;  Laterality: Left;   URETEROSCOPY  05/12/2017   Procedure: URETEROSCOPY;  Surgeon: Hildred Laser, MD;  Location: [facility];  Service: Urology;;     Social History   Tobacco Use   Smoking status: Former    Passive exposure: Past   Smokeless tobacco: Never   Tobacco comments:    quit in Feb of 1997  Substance Use Topics   Alcohol use: No   Drug use: No       Family History  Problem Relation Age of Onset   Cancer Mother    Varicose Veins Mother    Cancer Father      Allergies  Allergen Reactions   Celecoxib Other (See Comments)    Increased Blood Pressure   Chlorpromazine Nausea Only   Prednisone Other (See Comments)    Diabetic   Amoxicillin-Pot Clavulanate Rash    Has patient had a PCN reaction causing immediate rash, facial/tongue/throat swelling, SOB or lightheadedness with hypotension: No Has patient had a PCN reaction causing severe rash involving mucus membranes or skin necrosis: No Has patient had a PCN reaction that required hospitalization: No Has patient had a PCN reaction occurring within the last 10 years: Yes If all of the above answers are "NO", then  may proceed with Cephalosporin use.    Penicillin V Potassium Rash    REVIEW OF SYSTEMS (Negative unless checked)   Constitutional: [] Weight loss  [] Fever  [] Chills Cardiac: [] Chest pain   [] Chest pressure   [] Palpitations   [] Shortness of breath when laying flat   [] Shortness of breath at rest   [] Shortness of breath with exertion. Vascular:  [] Pain in legs with walking   [] Pain in legs at rest   [] Pain in legs when laying flat   [] Claudication   []   Pain in feet when walking  [] Pain in feet at rest  [] Pain in feet when laying flat   [] History of DVT   [] Phlebitis   [] Swelling in legs   [] Varicose veins   [] Non-healing ulcers Pulmonary:   [] Uses home oxygen   [] Productive cough   [] Hemoptysis   [] Wheeze  [] COPD   [] Asthma Neurologic:  [] Dizziness  [] Blackouts   [] Seizures   [] History of stroke   [] History of TIA  [] Aphasia   [] Temporary blindness   [] Dysphagia   [] Weakness or numbness in arms   [] Weakness or numbness in legs Musculoskeletal:  [x] Arthritis   [] Joint swelling   [x] Joint pain   [] Low back pain Hematologic:  [] Easy bruising  [] Easy bleeding   [] Hypercoagulable state   [x] Anemic   Gastrointestinal:  [] Blood in stool   [] Vomiting blood  [x] Gastroesophageal reflux/heartburn   [] Abdominal pain Genitourinary:  [x] Chronic kidney disease   [] Difficult urination  [] Frequent urination  [] Burning with urination   [] Hematuria Skin:  [] Rashes   [] Ulcers   [] Wounds Psychological:  [x] History of anxiety   []  History of major depression.  Physical Examination  BP 136/67 (BP Location: Left Arm)   Pulse 60   Resp 16   Wt 193 lb 9.6 oz (87.8 kg)   BMI 26.26 kg/m  Gen:  WD/WN, NAD Head: [place]/AT, No temporalis wasting. Ear/Nose/Throat: Hearing grossly intact, nares w/o erythema or drainage Eyes: Conjunctiva clear. Sclera non-icteric Neck: Supple.  Trachea midline Pulmonary:  Good air movement, no use of accessory muscles.  Cardiac: RRR, no JVD Vascular:  Vessel Right Left  Radial Palpable  Palpable                   Musculoskeletal: M/S 5/5 throughout.  No deformity or atrophy. No edema. Neurologic: Sensation grossly intact in extremities.  Symmetrical.  Speech is fluent.  Psychiatric: Judgment intact, Mood & affect appropriate for pt's clinical situation. Dermatologic: No rashes or ulcers noted.  No cellulitis or open wounds.      Labs Recent Results (from the past 2160 hour(s))  CBC with Differential     Status: Abnormal   Collection Time: 12/12/22  1:28 PM  Result Value Ref Range   WBC 14.7 (H) 4.0 - 10.5 K/uL   RBC 2.86 (L) 4.22 - 5.81 MIL/uL   Hemoglobin 9.2 (L) 13.0 - 17.0 g/dL   HCT 16.1 (L) 09.6 - 04.5 %   MCV 103.1 (H) 80.0 - 100.0 fL   MCH 32.2 26.0 - 34.0 pg   MCHC 31.2 30.0 - 36.0 g/dL   RDW 40.9 81.1 - 91.4 %   Platelets 57 (L) 150 - 400 K/uL   nRBC 0.0 0.0 - 0.2 %   Neutrophils Relative % 20 %   Neutro Abs 3.0 1.7 - 7.7 K/uL   Lymphocytes Relative 70 %   Lymphs Abs 10.3 (H) 0.7 - 4.0 K/uL   Monocytes Relative 9 %   Monocytes Absolute 1.3 (H) 0.1 - 1.0 K/uL   Eosinophils Relative 1 %   Eosinophils Absolute 0.1 0.0 - 0.5 K/uL   Basophils Relative 0 %   Basophils Absolute 0.0 0.0 - 0.1 K/uL   WBC Morphology SMUDGE CELLS     Comment: DIFF. CONFIRMED BY SMEAR   RBC Morphology MORPHOLOGY UNREMARKABLE    Smear Review Normal platelet morphology     Comment: PLATELETS APPEAR DECREASED PLATELET COUNT CONFIRMED BY SMEAR    Immature Granulocytes 0 %   Abs Immature Granulocytes 0.02 0.00 - 0.07 K/uL  Comment: Performed at [facility], [address] [place], [place] [address]  Basic metabolic panel     Status: Abnormal   Collection Time: 12/14/22  4:12 PM  Result Value Ref Range   Sodium 137 135 - 145 mmol/L   Potassium 4.2 3.5 - 5.1 mmol/L   Chloride 109 98 - 111 mmol/L   CO2 21 (L) 22 - 32 mmol/L   Glucose, Bld 131 (H) 70 - 99 mg/dL    Comment: Glucose reference range applies only to samples taken after fasting for at least 8  hours.   BUN 50 (H) 8 - 23 mg/dL   Creatinine, Ser 6.04 (H) 0.61 - 1.24 mg/dL   Calcium 8.4 (L) 8.9 - 10.3 mg/dL   GFR, Estimated 23 (L) >60 mL/min    Comment: (NOTE) Calculated using the CKD-EPI Creatinine Equation (2021)    Anion gap 7 5 - 15    Comment: Performed at [facility], [address] [place], [place] [address]  CBC     Status: Abnormal   Collection Time: 12/14/22  4:12 PM  Result Value Ref Range   WBC 12.8 (H) 4.0 - 10.5 K/uL   RBC 2.75 (L) 4.22 - 5.81 MIL/uL   Hemoglobin 8.9 (L) 13.0 - 17.0 g/dL   HCT 11.9 (L) 14.7 - 82.9 %   MCV 103.3 (H) 80.0 - 100.0 fL   MCH 32.4 26.0 - 34.0 pg   MCHC 31.3 30.0 - 36.0 g/dL   RDW 56.2 13.0 - 86.5 %   Platelets 58 (L) 150 - 400 K/uL    Comment: Immature Platelet Fraction may be clinically indicated, consider ordering this additional test [id number]    nRBC 0.0 0.0 - 0.2 %    Comment: Performed at [facility], [address] [place], [place] [address]  Procalcitonin     Status: None   Collection Time: 12/14/22  4:12 PM  Result Value Ref Range   Procalcitonin <0.10 ng/mL    Comment:        Interpretation: PCT (Procalcitonin) <= 0.5 ng/mL: Systemic infection (sepsis) is not likely. Local bacterial infection is possible. (NOTE)       Sepsis PCT Algorithm           Lower Respiratory Tract                                      Infection PCT Algorithm    ----------------------------     ----------------------------         PCT < 0.25 ng/mL                PCT < 0.10 ng/mL          Strongly encourage             Strongly discourage   discontinuation of antibiotics    initiation of antibiotics    ----------------------------     -----------------------------       PCT 0.25 - 0.50 ng/mL            PCT 0.10 - 0.25 ng/mL               OR       >80% decrease in PCT            Discourage initiation of  antibiotics      Encourage discontinuation           of  antibiotics    ----------------------------     -----------------------------         PCT >= 0.50 ng/mL              PCT 0.26 - 0.50 ng/mL               AND        <80% decrease in PCT             Encourage initiation of                                             antibiotics       Encourage continuation           of antibiotics    ----------------------------     -----------------------------        PCT >= 0.50 ng/mL                  PCT > 0.50 ng/mL               AND         increase in PCT                  Strongly encourage                                      initiation of antibiotics    Strongly encourage escalation           of antibiotics                                     -----------------------------                                           PCT <= 0.25 ng/mL                                                 OR                                        > 80% decrease in PCT                                      Discontinue / Do not initiate                                             antibiotics  Performed at [facility], [address] [place], [place] [address]   Urinalysis, Routine w reflex microscopic -Urine, Clean Catch  Status: Abnormal   Collection Time: 12/14/22  6:45 PM  Result Value Ref Range   Color, Urine STRAW (A) YELLOW   APPearance CLEAR (A) CLEAR   Specific Gravity, Urine 1.007 1.005 - 1.030   pH 5.0 5.0 - 8.0   Glucose, UA >=500 (A) NEGATIVE mg/dL   Hgb urine dipstick NEGATIVE NEGATIVE   Bilirubin Urine NEGATIVE NEGATIVE   Ketones, ur NEGATIVE NEGATIVE mg/dL   Protein, ur 161 (A) NEGATIVE mg/dL   Nitrite NEGATIVE NEGATIVE   Leukocytes,Ua NEGATIVE NEGATIVE   RBC / HPF 0-5 0 - 5 RBC/hpf   WBC, UA 0-5 0 - 5 WBC/hpf   Bacteria, UA NONE SEEN NONE SEEN   Squamous Epithelial / HPF NONE SEEN 0 - 5 /HPF    Comment: Performed at [facility], [address] [place], [place] [address]  Hemoglobin A1c     Status: Abnormal   Collection  Time: 12/15/22  3:46 AM  Result Value Ref Range   Hgb A1c MFr Bld 6.3 (H) 4.8 - 5.6 %    Comment: (NOTE)         Prediabetes: 5.7 - 6.4         Diabetes: >6.4         Glycemic control for adults with diabetes: <7.0    Mean Plasma Glucose 134 mg/dL    Comment: (NOTE) Performed At: [facility] [address] [place], [place] [id number] Jolene Schimke MD YN:[id number]   Folate     Status: None   Collection Time: 12/15/22  3:46 AM  Result Value Ref Range   Folate 20.6 >5.9 ng/mL    Comment: Performed at [facility], [address] [place], [place] [address]  Iron and TIBC     Status: None   Collection Time: 12/15/22  3:46 AM  Result Value Ref Range   Iron 67 45 - 182 ug/dL   TIBC 696 295 - 284 ug/dL   Saturation Ratios 27 17.9 - 39.5 %   UIBC 184 ug/dL    Comment: Performed at [facility], [address] [place], [place] [address]  Phosphorus     Status: None   Collection Time: 12/15/22  3:46 AM  Result Value Ref Range   Phosphorus 4.0 2.5 - 4.6 mg/dL    Comment: Performed at [facility], [address] [place], [place] [address]  Comprehensive metabolic panel     Status: Abnormal   Collection Time: 12/15/22  3:48 AM  Result Value Ref Range   Sodium 140 135 - 145 mmol/L   Potassium 4.1 3.5 - 5.1 mmol/L   Chloride 116 (H) 98 - 111 mmol/L   CO2 17 (L) 22 - 32 mmol/L   Glucose, Bld 83 70 - 99 mg/dL    Comment: Glucose reference range applies only to samples taken after fasting for at least 8 hours.   BUN 50 (H) 8 - 23 mg/dL   Creatinine, Ser 2.53 (H) 0.61 - 1.24 mg/dL   Calcium 8.2 (L) 8.9 - 10.3 mg/dL   Total Protein 4.6 (L) 6.5 - 8.1 g/dL   Albumin 2.8 (L) 3.5 - 5.0 g/dL   AST 15 15 - 41 U/L   ALT 11 0 - 44 U/L   Alkaline Phosphatase 91 38 - 126 U/L   Total Bilirubin 0.5 0.3 - 1.2 mg/dL   GFR, Estimated 25 (L) >60 mL/min    Comment: (NOTE) Calculated using the CKD-EPI Creatinine Equation (2021)    Anion gap 7  5 - 15     Comment: Performed at [facility], [address] [place], [place] [address]  CBC with Differential/Platelet     Status: Abnormal   Collection Time: 12/15/22  3:48 AM  Result Value Ref Range   WBC 13.0 (H) 4.0 - 10.5 K/uL   RBC 2.64 (L) 4.22 - 5.81 MIL/uL   Hemoglobin 8.5 (L) 13.0 - 17.0 g/dL   HCT 60.4 (L) 54.0 - 98.1 %   MCV 101.5 (H) 80.0 - 100.0 fL   MCH 32.2 26.0 - 34.0 pg   MCHC 31.7 30.0 - 36.0 g/dL   RDW 19.1 47.8 - 29.5 %   Platelets 57 (L) 150 - 400 K/uL    Comment: Immature Platelet Fraction may be clinically indicated, consider ordering this additional test [id number]    nRBC 0.0 0.0 - 0.2 %   Neutrophils Relative % 17 %   Neutro Abs 2.2 1.7 - 7.7 K/uL   Lymphocytes Relative 74 %   Lymphs Abs 9.6 (H) 0.7 - 4.0 K/uL   Monocytes Relative 8 %   Monocytes Absolute 1.0 0.1 - 1.0 K/uL   Eosinophils Relative 1 %   Eosinophils Absolute 0.1 0.0 - 0.5 K/uL   Basophils Relative 0 %   Basophils Absolute 0.0 0.0 - 0.1 K/uL   WBC Morphology MORPHOLOGY UNREMARKABLE    RBC Morphology MIXED RBC MORPHOLOGY    Smear Review Normal platelet morphology    Immature Granulocytes 0 %   Abs Immature Granulocytes 0.02 0.00 - 0.07 K/uL   Smudge Cells PRESENT     Comment: Performed at [facility], [address] [place], [place] [address]  Magnesium     Status: None   Collection Time: 12/15/22  3:48 AM  Result Value Ref Range   Magnesium 1.8 1.7 - 2.4 mg/dL    Comment: Performed at [facility], [address] [place], [place] [address]  Procalcitonin     Status: None   Collection Time: 12/15/22  3:48 AM  Result Value Ref Range   Procalcitonin <0.10 ng/mL    Comment:        Interpretation: PCT (Procalcitonin) <= 0.5 ng/mL: Systemic infection (sepsis) is not likely. Local bacterial infection is possible. (NOTE)       Sepsis PCT Algorithm           Lower Respiratory Tract                                      Infection PCT Algorithm     ----------------------------     ----------------------------         PCT < 0.25 ng/mL                PCT < 0.10 ng/mL          Strongly encourage             Strongly discourage   discontinuation of antibiotics    initiation of antibiotics    ----------------------------     -----------------------------       PCT 0.25 - 0.50 ng/mL            PCT 0.10 - 0.25 ng/mL               OR       >80% decrease in PCT            Discourage initiation of  antibiotics      Encourage discontinuation           of antibiotics    ----------------------------     -----------------------------         PCT >= 0.50 ng/mL              PCT 0.26 - 0.50 ng/mL               AND        <80% decrease in PCT             Encourage initiation of                                             antibiotics       Encourage continuation           of antibiotics    ----------------------------     -----------------------------        PCT >= 0.50 ng/mL                  PCT > 0.50 ng/mL               AND         increase in PCT                  Strongly encourage                                      initiation of antibiotics    Strongly encourage escalation           of antibiotics                                     -----------------------------                                           PCT <= 0.25 ng/mL                                                 OR                                        > 80% decrease in PCT                                      Discontinue / Do not initiate                                             antibiotics  Performed at [facility], [address] [place], [place] [address]   Glucose, capillary     Status: None   Collection Time: 12/15/22  6:16 AM  Result Value Ref Range   Glucose-Capillary 81 70 - 99 mg/dL    Comment: Glucose reference range applies only to samples taken after fasting for at least 8 hours.  Glucose, capillary      Status: Abnormal   Collection Time: 12/15/22  7:36 AM  Result Value Ref Range   Glucose-Capillary 126 (H) 70 - 99 mg/dL    Comment: Glucose reference range applies only to samples taken after fasting for at least 8 hours.  Urine Culture (for pregnant, neutropenic or urologic patients or patients with an indwelling urinary catheter)     Status: None   Collection Time: 12/15/22  8:30 AM   Specimen: Urine, Clean Catch  Result Value Ref Range   Specimen Description      URINE, CLEAN CATCH Performed at [facility], [address], [place], [place] [address]    Special Requests      NONE Performed at [facility], [address] [place], [place] [address]    Culture      NO GROWTH Performed at [facility], [address] [place], [place] [address]    Report Status 12/16/2022 FINAL   VITAMIN D 25 Hydroxy (Vit-D Deficiency, Fractures)     Status: None   Collection Time: 12/15/22  8:47 AM  Result Value Ref Range   Vit D, 25-Hydroxy 69.10 30 - 100 ng/mL    Comment: (NOTE) Vitamin D deficiency has been defined by the Institute of Medicine  and an Endocrine Society practice guideline as a level of serum 25-OH  vitamin D less than 20 ng/mL (1,2). The Endocrine Society went on to  further define vitamin D insufficiency as a level between 21 and 29  ng/mL (2).  1. IOM (Institute of Medicine). 2010. Dietary reference intakes for  calcium and D. [place]: [employer]. 2. Holick MF, Binkley Deuel, Bischoff-Ferrari HA, et al. Evaluation,  treatment, and prevention of vitamin D deficiency: an Endocrine  Society clinical practice guideline, JCEM. 2011 Jul; 96(7): 1911-30.  Performed at [facility], [address] [place], [place] [address]   C Difficile Quick Screen w PCR reflex     Status: None   Collection Time: 12/15/22  9:05 AM   Specimen: STOOL  Result Value Ref Range   C Diff antigen NEGATIVE NEGATIVE   C Diff toxin NEGATIVE  NEGATIVE   C Diff interpretation No C. difficile detected.     Comment: Performed at [facility], [address] [place], [place] [address]  Gastrointestinal Panel by PCR , Stool     Status: None   Collection Time: 12/15/22  9:05 AM   Specimen: Stool  Result Value Ref Range   Campylobacter species NOT DETECTED NOT DETECTED   Plesimonas shigelloides NOT DETECTED NOT DETECTED   Salmonella species NOT DETECTED NOT DETECTED   Yersinia enterocolitica NOT DETECTED NOT DETECTED   Vibrio species NOT DETECTED NOT DETECTED   Vibrio cholerae NOT DETECTED NOT DETECTED   Enteroaggregative E coli (EAEC) NOT DETECTED NOT DETECTED   Enteropathogenic E coli (EPEC) NOT DETECTED NOT DETECTED   Enterotoxigenic E coli (ETEC) NOT DETECTED NOT DETECTED   Shiga like toxin producing E coli (STEC) NOT DETECTED NOT DETECTED   Shigella/Enteroinvasive E coli (EIEC) NOT DETECTED NOT DETECTED   Cryptosporidium NOT DETECTED NOT DETECTED   Cyclospora cayetanensis NOT DETECTED NOT DETECTED   Entamoeba histolytica NOT DETECTED NOT DETECTED   Giardia lamblia NOT DETECTED NOT  DETECTED   Adenovirus F40/41 NOT DETECTED NOT DETECTED   Astrovirus NOT DETECTED NOT DETECTED   Norovirus GI/GII NOT DETECTED NOT DETECTED   Rotavirus A NOT DETECTED NOT DETECTED   Sapovirus (I, II, IV, and V) NOT DETECTED NOT DETECTED    Comment: Performed at [facility], [address] [place], [place] [address]  Glucose, capillary     Status: Abnormal   Collection Time: 12/15/22 12:20 PM  Result Value Ref Range   Glucose-Capillary 103 (H) 70 - 99 mg/dL    Comment: Glucose reference range applies only to samples taken after fasting for at least 8 hours.  Glucose, capillary     Status: Abnormal   Collection Time: 12/15/22  4:54 PM  Result Value Ref Range   Glucose-Capillary 124 (H) 70 - 99 mg/dL    Comment: Glucose reference range applies only to samples taken after fasting for at least 8 hours.  Glucose, capillary      Status: Abnormal   Collection Time: 12/15/22  9:15 PM  Result Value Ref Range   Glucose-Capillary 113 (H) 70 - 99 mg/dL    Comment: Glucose reference range applies only to samples taken after fasting for at least 8 hours.  Glucose, capillary     Status: Abnormal   Collection Time: 12/16/22  1:18 AM  Result Value Ref Range   Glucose-Capillary 119 (H) 70 - 99 mg/dL    Comment: Glucose reference range applies only to samples taken after fasting for at least 8 hours.  Magnesium     Status: None   Collection Time: 12/16/22  5:59 AM  Result Value Ref Range   Magnesium 1.9 1.7 - 2.4 mg/dL    Comment: Performed at [facility], [address] [place], [place] [address]  Phosphorus     Status: None   Collection Time: 12/16/22  5:59 AM  Result Value Ref Range   Phosphorus 3.7 2.5 - 4.6 mg/dL    Comment: Performed at [facility], [address] [place], [place] [address]  Basic metabolic panel     Status: Abnormal   Collection Time: 12/16/22  5:59 AM  Result Value Ref Range   Sodium 138 135 - 145 mmol/L   Potassium 4.2 3.5 - 5.1 mmol/L   Chloride 111 98 - 111 mmol/L   CO2 20 (L) 22 - 32 mmol/L   Glucose, Bld 113 (H) 70 - 99 mg/dL    Comment: Glucose reference range applies only to samples taken after fasting for at least 8 hours.   BUN 42 (H) 8 - 23 mg/dL   Creatinine, Ser 0.86 (H) 0.61 - 1.24 mg/dL   Calcium 8.0 (L) 8.9 - 10.3 mg/dL   GFR, Estimated 26 (L) >60 mL/min    Comment: (NOTE) Calculated using the CKD-EPI Creatinine Equation (2021)    Anion gap 7 5 - 15    Comment: Performed at [facility], [address] [place], [place] [address]  CBC     Status: Abnormal   Collection Time: 12/16/22  5:59 AM  Result Value Ref Range   WBC 10.4 4.0 - 10.5 K/uL   RBC 2.59 (L) 4.22 - 5.81 MIL/uL   Hemoglobin 8.4 (L) 13.0 - 17.0 g/dL   HCT 96.2 (L) 95.2 - 84.1 %   MCV 102.3 (H) 80.0 - 100.0 fL   MCH 32.4 26.0 - 34.0 pg   MCHC 31.7 30.0 - 36.0 g/dL   RDW  32.4 40.1 - 02.7 %  Platelets 41 (L) 150 - 400 K/uL    Comment: Immature Platelet Fraction may be clinically indicated, consider ordering this additional test [id number]    nRBC 0.0 0.0 - 0.2 %    Comment: Performed at [facility], [address] [place], [place] [address]  Glucose, capillary     Status: Abnormal   Collection Time: 12/16/22  7:51 AM  Result Value Ref Range   Glucose-Capillary 129 (H) 70 - 99 mg/dL    Comment: Glucose reference range applies only to samples taken after fasting for at least 8 hours.   Comment 1 Notify RN   Glucose, capillary     Status: Abnormal   Collection Time: 12/16/22 11:41 AM  Result Value Ref Range   Glucose-Capillary 190 (H) 70 - 99 mg/dL    Comment: Glucose reference range applies only to samples taken after fasting for at least 8 hours.   Comment 1 Notify RN   Glucose, capillary     Status: Abnormal   Collection Time: 12/16/22  4:12 PM  Result Value Ref Range   Glucose-Capillary 131 (H) 70 - 99 mg/dL    Comment: Glucose reference range applies only to samples taken after fasting for at least 8 hours.   Comment 1 Notify RN   Glucose, capillary     Status: Abnormal   Collection Time: 12/16/22  7:52 PM  Result Value Ref Range   Glucose-Capillary 126 (H) 70 - 99 mg/dL    Comment: Glucose reference range applies only to samples taken after fasting for at least 8 hours.  Magnesium     Status: None   Collection Time: 12/17/22  4:17 AM  Result Value Ref Range   Magnesium 1.8 1.7 - 2.4 mg/dL    Comment: Performed at [facility], [address] [place], [place] [address]  Phosphorus     Status: None   Collection Time: 12/17/22  4:17 AM  Result Value Ref Range   Phosphorus 3.5 2.5 - 4.6 mg/dL    Comment: Performed at [facility], [address] [place], [place] [address]  Basic metabolic panel     Status: Abnormal   Collection Time: 12/17/22  4:17 AM  Result Value Ref Range   Sodium 140 135 - 145 mmol/L    Potassium 4.1 3.5 - 5.1 mmol/L   Chloride 110 98 - 111 mmol/L   CO2 23 22 - 32 mmol/L   Glucose, Bld 95 70 - 99 mg/dL    Comment: Glucose reference range applies only to samples taken after fasting for at least 8 hours.   BUN 34 (H) 8 - 23 mg/dL   Creatinine, Ser 2.95 (H) 0.61 - 1.24 mg/dL   Calcium 7.9 (L) 8.9 - 10.3 mg/dL   GFR, Estimated 29 (L) >60 mL/min    Comment: (NOTE) Calculated using the CKD-EPI Creatinine Equation (2021)    Anion gap 7 5 - 15    Comment: Performed at [facility], [address] [place], [place] [address]  CBC     Status: Abnormal   Collection Time: 12/17/22  4:17 AM  Result Value Ref Range   WBC 12.7 (H) 4.0 - 10.5 K/uL   RBC 2.52 (L) 4.22 - 5.81 MIL/uL   Hemoglobin 8.1 (L) 13.0 - 17.0 g/dL   HCT 24.4 (L) 01.0 - 27.2 %   MCV 103.6 (H) 80.0 - 100.0 fL   MCH 32.1 26.0 - 34.0 pg   MCHC 31.0 30.0 - 36.0 g/dL  RDW 15.1 11.5 - 15.5 %   Platelets 51 (L) 150 - 400 K/uL    Comment: Immature Platelet Fraction may be clinically indicated, consider ordering this additional test [id number]    nRBC 0.0 0.0 - 0.2 %    Comment: Performed at [facility], [address] [place], [place] [address]  C-reactive protein     Status: None   Collection Time: 12/17/22  4:17 AM  Result Value Ref Range   CRP <0.5 <1.0 mg/dL    Comment: Performed at [facility], [address], [place], [place] [address]  Glucose, capillary     Status: None   Collection Time: 12/17/22  7:58 AM  Result Value Ref Range   Glucose-Capillary 94 70 - 99 mg/dL    Comment: Glucose reference range applies only to samples taken after fasting for at least 8 hours.  Glucose, capillary     Status: Abnormal   Collection Time: 12/17/22 12:08 PM  Result Value Ref Range   Glucose-Capillary 130 (H) 70 - 99 mg/dL    Comment: Glucose reference range applies only to samples taken after fasting for at least 8 hours.  Glucose, capillary     Status: Abnormal   Collection Time:  12/17/22  4:21 PM  Result Value Ref Range   Glucose-Capillary 161 (H) 70 - 99 mg/dL    Comment: Glucose reference range applies only to samples taken after fasting for at least 8 hours.  Glucose, capillary     Status: Abnormal   Collection Time: 12/17/22  8:31 PM  Result Value Ref Range   Glucose-Capillary 113 (H) 70 - 99 mg/dL    Comment: Glucose reference range applies only to samples taken after fasting for at least 8 hours.  Magnesium     Status: None   Collection Time: 12/18/22  5:32 AM  Result Value Ref Range   Magnesium 1.8 1.7 - 2.4 mg/dL    Comment: Performed at [facility], [address] [place], [place] [address]  Phosphorus     Status: None   Collection Time: 12/18/22  5:32 AM  Result Value Ref Range   Phosphorus 3.2 2.5 - 4.6 mg/dL    Comment: Performed at [facility], [address] [place], [place] [address]  Basic metabolic panel     Status: Abnormal   Collection Time: 12/18/22  5:32 AM  Result Value Ref Range   Sodium 140 135 - 145 mmol/L   Potassium 4.2 3.5 - 5.1 mmol/L   Chloride 110 98 - 111 mmol/L   CO2 23 22 - 32 mmol/L   Glucose, Bld 97 70 - 99 mg/dL    Comment: Glucose reference range applies only to samples taken after fasting for at least 8 hours.   BUN 29 (H) 8 - 23 mg/dL   Creatinine, Ser 8.84 (H) 0.61 - 1.24 mg/dL   Calcium 7.6 (L) 8.9 - 10.3 mg/dL   GFR, Estimated 31 (L) >60 mL/min    Comment: (NOTE) Calculated using the CKD-EPI Creatinine Equation (2021)    Anion gap 7 5 - 15    Comment: Performed at [facility], [address] [place], [place] [address]  CBC     Status: Abnormal   Collection Time: 12/18/22  5:32 AM  Result Value Ref Range   WBC 14.0 (H) 4.0 - 10.5 K/uL   RBC 2.53 (L) 4.22 - 5.81 MIL/uL   Hemoglobin 8.2 (L) 13.0 - 17.0 g/dL   HCT 30.1 (L)  39.0 - 52.0 %   MCV 103.6 (H) 80.0 - 100.0 fL   MCH 32.4 26.0 - 34.0 pg   MCHC 31.3 30.0 - 36.0 g/dL   RDW 09.8 11.9 - 14.7 %   Platelets 46 (L) 150  - 400 K/uL    Comment: Immature Platelet Fraction may be clinically indicated, consider ordering this additional test [id number]    nRBC 0.0 0.0 - 0.2 %    Comment: Performed at [facility] [place], [address] [place], [place] [address]  C-reactive protein     Status: None   Collection Time: 12/18/22  5:32 AM  Result Value Ref Range   CRP <0.5 <1.0 mg/dL    Comment: Performed at [facility], [address], [place], [place] [address]  Glucose, capillary     Status: Abnormal   Collection Time: 12/18/22  9:54 AM  Result Value Ref Range   Glucose-Capillary 155 (H) 70 - 99 mg/dL    Comment: Glucose reference range applies only to samples taken after fasting for at least 8 hours.  Glucose, capillary     Status: Abnormal   Collection Time: 12/18/22 11:44 AM  Result Value Ref Range   Glucose-Capillary 121 (H) 70 - 99 mg/dL    Comment: Glucose reference range applies only to samples taken after fasting for at least 8 hours.  Glucose, capillary     Status: None   Collection Time: 12/18/22  3:45 PM  Result Value Ref Range   Glucose-Capillary 87 70 - 99 mg/dL    Comment: Glucose reference range applies only to samples taken after fasting for at least 8 hours.  Glucose, capillary     Status: Abnormal   Collection Time: 12/18/22  9:07 PM  Result Value Ref Range   Glucose-Capillary 109 (H) 70 - 99 mg/dL    Comment: Glucose reference range applies only to samples taken after fasting for at least 8 hours.  Basic metabolic panel     Status: Abnormal   Collection Time: 12/19/22  4:10 AM  Result Value Ref Range   Sodium 136 135 - 145 mmol/L   Potassium 3.7 3.5 - 5.1 mmol/L   Chloride 106 98 - 111 mmol/L   CO2 24 22 - 32 mmol/L   Glucose, Bld 80 70 - 99 mg/dL    Comment: Glucose reference range applies only to samples taken after fasting for at least 8 hours.   BUN 26 (H) 8 - 23 mg/dL   Creatinine, Ser 2.95 (H) 0.61 - 1.24 mg/dL   Calcium 7.2 (L) 8.9 - 10.3 mg/dL   GFR,  Estimated 31 (L) >60 mL/min    Comment: (NOTE) Calculated using the CKD-EPI Creatinine Equation (2021)    Anion gap 6 5 - 15    Comment: Performed at [facility], [address] [place], [place] [address]  CBC     Status: Abnormal   Collection Time: 12/19/22  4:10 AM  Result Value Ref Range   WBC 13.9 (H) 4.0 - 10.5 K/uL   RBC 2.38 (L) 4.22 - 5.81 MIL/uL   Hemoglobin 7.7 (L) 13.0 - 17.0 g/dL   HCT 24.4 (L) 01.0 - 27.2 %   MCV 103.4 (H) 80.0 - 100.0 fL   MCH 32.4 26.0 - 34.0 pg   MCHC 31.3 30.0 - 36.0 g/dL   RDW 53.6 64.4 - 03.4 %   Platelets 39 (L) 150 - 400 K/uL    Comment: Immature Platelet Fraction may be clinically indicated, consider  ordering this additional test [id number]    nRBC 0.0 0.0 - 0.2 %    Comment: Performed at [facility], [address] [place], [place] [address]  Glucose, capillary     Status: None   Collection Time: 12/19/22  7:42 AM  Result Value Ref Range   Glucose-Capillary 78 70 - 99 mg/dL    Comment: Glucose reference range applies only to samples taken after fasting for at least 8 hours.  Glucose, capillary     Status: Abnormal   Collection Time: 12/19/22 11:31 AM  Result Value Ref Range   Glucose-Capillary 122 (H) 70 - 99 mg/dL    Comment: Glucose reference range applies only to samples taken after fasting for at least 8 hours.  CBC with Differential (Cancer Center Only)     Status: Abnormal   Collection Time: 01/10/23 12:40 PM  Result Value Ref Range   WBC Count 9.4 4.0 - 10.5 K/uL   RBC 2.57 (L) 4.22 - 5.81 MIL/uL   Hemoglobin 8.4 (L) 13.0 - 17.0 g/dL   HCT 13.0 (L) 86.5 - 78.4 %   MCV 105.1 (H) 80.0 - 100.0 fL   MCH 32.7 26.0 - 34.0 pg   MCHC 31.1 30.0 - 36.0 g/dL   RDW 69.6 29.5 - 28.4 %   Platelet Count 43 (L) 150 - 400 K/uL   nRBC 0.0 0.0 - 0.2 %   Neutrophils Relative % 10 %   Neutro Abs 1.0 (L) 1.7 - 7.7 K/uL   Lymphocytes Relative 78 %   Lymphs Abs 7.2 (H) 0.7 - 4.0 K/uL   Monocytes Relative 11 %    Monocytes Absolute 1.0 0.1 - 1.0 K/uL   Eosinophils Relative 1 %   Eosinophils Absolute 0.1 0.0 - 0.5 K/uL   Basophils Relative 0 %   Basophils Absolute 0.0 0.0 - 0.1 K/uL   WBC Morphology ABSOLUTE LYMPHOCYTOSIS     Comment: DIFF CONFIRMED BY MANUAL.   RBC Morphology UNREMARKABLE    Smear Review Normal platelet morphology     Comment: PLATELETS APPEAR DECREASED   Immature Granulocytes 0 %   Abs Immature Granulocytes 0.00 0.00 - 0.07 K/uL    Comment: Performed at [facility], [address] [place], [place] [address]  CBC with Differential     Status: Abnormal   Collection Time: 02/09/23  1:10 PM  Result Value Ref Range   WBC 6.2 4.0 - 10.5 K/uL   RBC 2.46 (L) 4.22 - 5.81 MIL/uL   Hemoglobin 8.2 (L) 13.0 - 17.0 g/dL   HCT 01.0 (L) 27.2 - 53.6 %   MCV 104.5 (H) 80.0 - 100.0 fL   MCH 33.3 26.0 - 34.0 pg   MCHC 31.9 30.0 - 36.0 g/dL   RDW 64.4 03.4 - 74.2 %   Platelets 29 (L) 150 - 400 K/uL    Comment: Immature Platelet Fraction may be clinically indicated, consider ordering this additional test [id number]    nRBC 0.0 0.0 - 0.2 %   Neutrophils Relative % 14 %   Neutro Abs 0.9 (L) 1.7 - 7.7 K/uL   Lymphocytes Relative 80 %   Lymphs Abs 4.9 (H) 0.7 - 4.0 K/uL   Monocytes Relative 4 %   Monocytes Absolute 0.3 0.1 - 1.0 K/uL   Eosinophils Relative 2 %   Eosinophils Absolute 0.1 0.0 - 0.5 K/uL   Basophils Relative 0 %   Basophils Absolute 0.0 0.0 - 0.1 K/uL   WBC Morphology Abnormal lymphocytes present  RBC Morphology See Note     Comment: SOME POIKILOCYTOSIS NOTED   Smear Review Normal platelet morphology     Comment: PLATELETS APPEAR DECREASED   Immature Granulocytes 0 %   Abs Immature Granulocytes 0.00 0.00 - 0.07 K/uL    Comment: Performed at [facility], [address] [place], [place] [address]  CBC with Differential (Cancer Center Only)     Status: Abnormal   Collection Time: 02/23/23  1:11 PM  Result Value Ref Range   WBC Count 7.8 4.0 - 10.5 K/uL    RBC 2.48 (L) 4.22 - 5.81 MIL/uL   Hemoglobin 8.2 (L) 13.0 - 17.0 g/dL   HCT 60.4 (L) 54.0 - 98.1 %   MCV 106.0 (H) 80.0 - 100.0 fL   MCH 33.1 26.0 - 34.0 pg   MCHC 31.2 30.0 - 36.0 g/dL   RDW 19.1 47.8 - 29.5 %   Platelet Count 32 (L) 150 - 400 K/uL    Comment: Immature Platelet Fraction may be clinically indicated, consider ordering this additional test [id number]    nRBC 0.0 0.0 - 0.2 %   Neutrophils Relative % 12 %   Neutro Abs 0.9 (L) 1.7 - 7.7 K/uL   Lymphocytes Relative 78 %   Lymphs Abs 6.0 (H) 0.7 - 4.0 K/uL   Monocytes Relative 9 %   Monocytes Absolute 0.7 0.1 - 1.0 K/uL   Eosinophils Relative 1 %   Eosinophils Absolute 0.1 0.0 - 0.5 K/uL   Basophils Relative 0 %   Basophils Absolute 0.0 0.0 - 0.1 K/uL   WBC Morphology Abnormal lymphocytes present    Immature Granulocytes 0 %   Abs Immature Granulocytes 0.01 0.00 - 0.07 K/uL   Schistocytes PRESENT    Tear Drop Cells PRESENT    Ovalocytes PRESENT     Comment: Performed at [facility], [address], [place], [place] [address]    Radiology No results found.  Assessment/Plan Diabetes mellitus (HCC) blood glucose control important in reducing the progression of atherosclerotic disease. Also, involved in wound healing. On appropriate medications.     BP (high blood pressure) blood pressure control important in reducing the progression of atherosclerotic disease and aneurysmal disease. On appropriate oral medications.   Chronic kidney disease (CKD), stage III (moderate) Hydrate and limit contrast when and if we repair.   HLD (hyperlipidemia) lipid control important in reducing the progression of atherosclerotic disease. Continue statin therapy   Carotid stenosis Carotid duplex today last fall stable 1 to 39% ICA stenosis bilaterally without significant progression from previous studies.  No increased risk of stroke.  Recheck in 1 year.  Abdominal aortic aneurysm (AAA) (HCC) I had a long discussion  with he and his wife today.  Been following this aneurysm for many years now.  He is size of 5.0 cm is just at the threshold for prophylactic repair so he wants to hold off on this I think that is reasonable, but the thing that concerns me a little bit is the relatively rapid growth.  It is had about 9 mm of growth in the past 15 months and about 6 mm growth in the past 6 to 7 months.  If he continues to grow relatively rapidly like this, I think repair will need to be done.  We can do a short interval follow-up of about 3 months with a CT scan.  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  If he remains stable I am okay watching this for a little while longer but if he has growth we may have to consider repair despite his extensive medical comorbidities.  His chronic kidney disease is probably the situation that scares me the most in terms of renal dysfunction with the procedure.  We can transfuse platelets to overcome his thrombocytopenia and have blood available for transfusion if significant blood loss is encountered.  His heart seem to be reasonably stable.  I will plan to see him back in 3 months with a CT scan of the abdomen pelvis.    Festus Barren, MD  02/28/2023 4:29 PM    This note was created with Dragon medical transcription system.  Any errors from dictation are purely unintentional

## 2023-02-28 NOTE — Assessment & Plan Note (Signed)
I had a long discussion with he and his wife today.  Been following this aneurysm for many years now.  He is size of 5.0 cm is just at the threshold for prophylactic repair so he wants to hold off on this I think that is reasonable, but the thing that concerns me a little bit is the relatively rapid growth.  It is had about 9 mm of growth in the past 15 months and about 6 mm growth in the past 6 to 7 months.  If he continues to grow relatively rapidly like this, I think repair will need to be done.  We can do a short interval follow-up of about 3 months with a CT scan.  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  If he remains stable I am okay watching this for a little while longer but if he has growth we may have to consider repair despite his extensive medical comorbidities.  His chronic kidney disease is probably the situation that scares me the most in terms of renal dysfunction with the procedure.  We can transfuse platelets to overcome his thrombocytopenia and have blood available for transfusion if significant blood loss is encountered.  His heart seem to be reasonably stable.  I will plan to see him back in 3 months with a CT scan of the abdomen pelvis.

## 2023-03-10 ENCOUNTER — Other Ambulatory Visit: Payer: Self-pay

## 2023-03-13 ENCOUNTER — Inpatient Hospital Stay: Payer: Medicare Other | Attending: Oncology

## 2023-03-13 ENCOUNTER — Inpatient Hospital Stay: Payer: Medicare Other

## 2023-03-13 VITALS — BP 130/70

## 2023-03-13 DIAGNOSIS — D631 Anemia in chronic kidney disease: Secondary | ICD-10-CM | POA: Diagnosis present

## 2023-03-13 DIAGNOSIS — C9110 Chronic lymphocytic leukemia of B-cell type not having achieved remission: Secondary | ICD-10-CM | POA: Insufficient documentation

## 2023-03-13 DIAGNOSIS — N1832 Chronic kidney disease, stage 3b: Secondary | ICD-10-CM | POA: Insufficient documentation

## 2023-03-13 LAB — CBC WITH DIFFERENTIAL/PLATELET
Abs Immature Granulocytes: 0 10*3/uL (ref 0.00–0.07)
Band Neutrophils: 0 %
Basophils Absolute: 0 10*3/uL (ref 0.0–0.1)
Basophils Relative: 0 %
Blasts: 0 %
Eosinophils Absolute: 0.1 10*3/uL (ref 0.0–0.5)
Eosinophils Relative: 1 %
HCT: 26.8 % — ABNORMAL LOW (ref 39.0–52.0)
Hemoglobin: 8.5 g/dL — ABNORMAL LOW (ref 13.0–17.0)
Lymphocytes Relative: 81 %
Lymphs Abs: 7.1 10*3/uL — ABNORMAL HIGH (ref 0.7–4.0)
MCH: 33.2 pg (ref 26.0–34.0)
MCHC: 31.7 g/dL (ref 30.0–36.0)
MCV: 104.7 fL — ABNORMAL HIGH (ref 80.0–100.0)
Metamyelocytes Relative: 0 %
Monocytes Absolute: 0.5 10*3/uL (ref 0.1–1.0)
Monocytes Relative: 6 %
Myelocytes: 0 %
Neutro Abs: 1.1 10*3/uL — ABNORMAL LOW (ref 1.7–7.7)
Neutrophils Relative %: 12 %
Other: 0 %
Platelets: 34 10*3/uL — ABNORMAL LOW (ref 150–400)
Promyelocytes Relative: 0 %
RBC: 2.56 MIL/uL — ABNORMAL LOW (ref 4.22–5.81)
RDW: 14.5 % (ref 11.5–15.5)
WBC: 8.8 10*3/uL (ref 4.0–10.5)
nRBC: 0 % (ref 0.0–0.2)
nRBC: 0 /100 WBC

## 2023-03-13 MED ORDER — EPOETIN ALFA-EPBX 40000 UNIT/ML IJ SOLN
40000.0000 [IU] | Freq: Once | INTRAMUSCULAR | Status: AC
  Administered 2023-03-13: 40000 [IU] via SUBCUTANEOUS
  Filled 2023-03-13: qty 1

## 2023-03-21 DIAGNOSIS — H353211 Exudative age-related macular degeneration, right eye, with active choroidal neovascularization: Secondary | ICD-10-CM | POA: Insufficient documentation

## 2023-04-10 ENCOUNTER — Other Ambulatory Visit: Payer: Self-pay

## 2023-04-10 DIAGNOSIS — D631 Anemia in chronic kidney disease: Secondary | ICD-10-CM

## 2023-04-10 DIAGNOSIS — D696 Thrombocytopenia, unspecified: Secondary | ICD-10-CM

## 2023-04-11 ENCOUNTER — Ambulatory Visit: Payer: Medicare Other

## 2023-04-11 ENCOUNTER — Inpatient Hospital Stay: Payer: Medicare Other | Attending: Oncology

## 2023-04-11 ENCOUNTER — Inpatient Hospital Stay (HOSPITAL_BASED_OUTPATIENT_CLINIC_OR_DEPARTMENT_OTHER): Payer: Medicare Other | Admitting: Oncology

## 2023-04-11 ENCOUNTER — Inpatient Hospital Stay: Payer: Medicare Other

## 2023-04-11 ENCOUNTER — Encounter: Payer: Self-pay | Admitting: Oncology

## 2023-04-11 ENCOUNTER — Other Ambulatory Visit: Payer: Medicare Other

## 2023-04-11 VITALS — BP 129/66 | HR 55 | Temp 96.9°F | Resp 16 | Ht 72.0 in | Wt 186.5 lb

## 2023-04-11 DIAGNOSIS — Z87891 Personal history of nicotine dependence: Secondary | ICD-10-CM | POA: Insufficient documentation

## 2023-04-11 DIAGNOSIS — D696 Thrombocytopenia, unspecified: Secondary | ICD-10-CM | POA: Insufficient documentation

## 2023-04-11 DIAGNOSIS — D631 Anemia in chronic kidney disease: Secondary | ICD-10-CM | POA: Insufficient documentation

## 2023-04-11 DIAGNOSIS — Z7984 Long term (current) use of oral hypoglycemic drugs: Secondary | ICD-10-CM | POA: Diagnosis not present

## 2023-04-11 DIAGNOSIS — E1122 Type 2 diabetes mellitus with diabetic chronic kidney disease: Secondary | ICD-10-CM | POA: Diagnosis not present

## 2023-04-11 DIAGNOSIS — Z79899 Other long term (current) drug therapy: Secondary | ICD-10-CM | POA: Insufficient documentation

## 2023-04-11 DIAGNOSIS — N1832 Chronic kidney disease, stage 3b: Secondary | ICD-10-CM | POA: Insufficient documentation

## 2023-04-11 DIAGNOSIS — Z794 Long term (current) use of insulin: Secondary | ICD-10-CM | POA: Diagnosis not present

## 2023-04-11 DIAGNOSIS — C9110 Chronic lymphocytic leukemia of B-cell type not having achieved remission: Secondary | ICD-10-CM | POA: Diagnosis present

## 2023-04-11 LAB — CBC WITH DIFFERENTIAL (CANCER CENTER ONLY)
Abs Immature Granulocytes: 0.01 10*3/uL (ref 0.00–0.07)
Basophils Absolute: 0 10*3/uL (ref 0.0–0.1)
Basophils Relative: 0 %
Eosinophils Absolute: 0.1 10*3/uL (ref 0.0–0.5)
Eosinophils Relative: 1 %
HCT: 26.5 % — ABNORMAL LOW (ref 39.0–52.0)
Hemoglobin: 8.3 g/dL — ABNORMAL LOW (ref 13.0–17.0)
Immature Granulocytes: 0 %
Lymphocytes Relative: 68 %
Lymphs Abs: 4.5 10*3/uL — ABNORMAL HIGH (ref 0.7–4.0)
MCH: 33.1 pg (ref 26.0–34.0)
MCHC: 31.3 g/dL (ref 30.0–36.0)
MCV: 105.6 fL — ABNORMAL HIGH (ref 80.0–100.0)
Monocytes Absolute: 0.5 10*3/uL (ref 0.1–1.0)
Monocytes Relative: 8 %
Neutro Abs: 1.5 10*3/uL — ABNORMAL LOW (ref 1.7–7.7)
Neutrophils Relative %: 23 %
Platelet Count: 32 10*3/uL — ABNORMAL LOW (ref 150–400)
RBC: 2.51 MIL/uL — ABNORMAL LOW (ref 4.22–5.81)
RDW: 14.1 % (ref 11.5–15.5)
Smear Review: NORMAL
WBC Count: 6.7 10*3/uL (ref 4.0–10.5)
nRBC: 0 % (ref 0.0–0.2)

## 2023-04-11 MED ORDER — EPOETIN ALFA-EPBX 40000 UNIT/ML IJ SOLN
40000.0000 [IU] | Freq: Once | INTRAMUSCULAR | Status: AC
  Administered 2023-04-11: 40000 [IU] via SUBCUTANEOUS
  Filled 2023-04-11: qty 1

## 2023-04-11 NOTE — Progress Notes (Signed)
[facility]  [phone] [phone]  ID: Patrick Catholic Clearman Jr. OB: Oct 04, 1934  MR#: [id number]  AOZ#:[id number]  Patient Care Team: Marguarite Arbour, MD as PCP - General (Internal Medicine) Jeralyn Ruths, MD as Consulting Physician (Oncology)  CHIEF COMPLAINT: CLL, anemia secondary to chronic renal insufficiency, thrombocytopenia.  INTERVAL HISTORY: Patient returns to clinic today as an add-on for discussion of treatment of his thrombocytopenia prior to aortic aneurysm repair.  He continues to have chronic weakness and fatigue, but otherwise feels well. He denies any fevers, night sweats, or weight loss. He has noted no new lymphadenopathy.  He has no neurologic complaints today.  He denies any chest pain, shortness of breath, cough, or hemoptysis.  He denies any nausea, vomiting, constipation, or diarrhea. He has no urinary complaints.  Patient offers no further specific complaints today.  REVIEW OF SYSTEMS:   Review of Systems  Constitutional:  Positive for malaise/fatigue. Negative for diaphoresis, fever and weight loss.  Respiratory: Negative.  Negative for cough and shortness of breath.   Cardiovascular: Negative.  Negative for chest pain and leg swelling.  Gastrointestinal: Negative.  Negative for abdominal pain, blood in stool and melena.  Genitourinary: Negative.  Negative for dysuria.  Musculoskeletal: Negative.  Negative for back pain.  Skin: Negative.  Negative for rash.  Neurological:  Positive for weakness. Negative for dizziness, sensory change, focal weakness and headaches.  Psychiatric/Behavioral: Negative.  The patient is not nervous/anxious.     As per HPI. Otherwise, a complete review of systems is negative.  PAST MEDICAL HISTORY: Past Medical History:  Diagnosis Date   Anxiety    CKD (chronic kidney disease), stage III (HCC)    CLL (chronic lymphocytic leukemia) (HCC)    Diabetes mellitus without complication (HCC)     Diverticulitis 04/09/2017   History of kidney stones    HLD (hyperlipidemia)    HTN (hypertension)    PKD (polycystic kidney disease)    Sleep apnea    Uncontrolled type 2 diabetes mellitus with hyperglycemia (HCC) 06/28/2018    PAST SURGICAL HISTORY: Past Surgical History:  Procedure Laterality Date   CARDIAC CATHETERIZATION  12/1987   CENTRAL LINE INSERTION N/A 04/11/2017   Procedure: Central Line Insertion;  Surgeon: Renford Dills, MD;  Location: [facility];  Service: Cardiovascular;  Laterality: N/A;   CERVICAL FUSION     CHOLECYSTECTOMY     CYSTOSCOPY W/ RETROGRADES Left 05/12/2017   Procedure: CYSTOSCOPY WITH RETROGRADE PYELOGRAM;  Surgeon: Hildred Laser, MD;  Location: [facility];  Service: Urology;  Laterality: Left;   CYSTOSCOPY W/ URETERAL STENT PLACEMENT Left 05/12/2017   Procedure: CYSTOSCOPY WITH STENT REMOVAL;  Surgeon: Hildred Laser, MD;  Location: [facility];  Service: Urology;  Laterality: Left;   CYSTOSCOPY WITH STENT PLACEMENT Left 04/11/2017   Procedure: CYSTOSCOPY WITH STENT PLACEMENT;  Surgeon: Jerilee Field, MD;  Location: [facility];  Service: Urology;  Laterality: Left;   URETEROSCOPY  05/12/2017   Procedure: URETEROSCOPY;  Surgeon: Hildred Laser, MD;  Location: [facility];  Service: Urology;;    FAMILY HISTORY: Reported history of ovarian and lung cancer. Diabetes, hypertension.     ADVANCED DIRECTIVES:    HEALTH MAINTENANCE: Social History   Tobacco Use   Smoking status: Former    Passive exposure: Past   Smokeless tobacco: Never   Tobacco comments:    quit in Feb of 1997  Substance Use Topics   Alcohol use: No   Drug use: No  Colonoscopy:  PAP:  Bone density:  Lipid panel:  Allergies  Allergen Reactions   Celecoxib Other (See Comments)    Increased Blood Pressure   Chlorpromazine Nausea Only   Iodinated Contrast Media Other (See Comments)    Stage 3 kidney disease, told IV dye would be bad for him    Prednisone Other (See Comments)    Diabetic   Amoxicillin-Pot Clavulanate Rash    Has patient had a PCN reaction causing immediate rash, facial/tongue/throat swelling, SOB or lightheadedness with hypotension: No Has patient had a PCN reaction causing severe rash involving mucus membranes or skin necrosis: No Has patient had a PCN reaction that required hospitalization: No Has patient had a PCN reaction occurring within the last 10 years: Yes If all of the above answers are "NO", then may proceed with Cephalosporin use.    Penicillin V Potassium Rash    Current Outpatient Medications  Medication Sig Dispense Refill   Aflibercept 2 MG/0.05ML SOLN Inject 1 Dose into the eye as directed. One injection into left eye every 8-9 weeks     allopurinol (ZYLOPRIM) 100 MG tablet Take 100 mg by mouth daily.     atorvastatin (LIPITOR) 10 MG tablet Take 10 mg by mouth at bedtime.      BD PEN NEEDLE NANO 2ND GEN 32G X 4 MM MISC USE AS DIRECTED ONCE A DAY     carvedilol (COREG) 12.5 MG tablet Take 12.5 mg by mouth 2 (two) times daily with a meal.     Cholecalciferol (VITAMIN D-3) 5000 units TABS Take 5,000 Units by mouth at bedtime.      clonazePAM (KLONOPIN) 1 MG tablet Take 1 mg by mouth at bedtime.      CONTOUR NEXT TEST test strip daily.     cyanocobalamin 500 MCG tablet Take 1,000 mcg by mouth at bedtime.      empagliflozin (JARDIANCE) 10 MG TABS tablet 10 mg daily.     epoetin alfa-epbx (RETACRIT) 16109 UNIT/ML injection Inject 40,000 Units into the skin every 6 (six) weeks. Depending on lab results (if needed)     fexofenadine (ALLEGRA) 180 MG tablet Take 180 mg by mouth daily as needed for allergies.     finasteride (PROSCAR) 5 MG tablet Take 1 tablet (5 mg total) by mouth daily. 90 tablet 3   folic acid (FOLVITE) 400 MCG tablet Take 400 mcg by mouth daily.     insulin glargine (LANTUS) 100 UNIT/ML injection Inject 20-30 Units into the skin every morning. Takes Lantus 20 units if CBG less than 120  mg/dl or Lantus 30 units if CBG is over 120 mg/dl     LORazepam (ATIVAN) 1 MG tablet Take 1 mg by mouth every 8 (eight) hours as needed for anxiety (takes 1 tablet every morning.  Rarely has to take again during the day).      Multiple Vitamins-Minerals (PRESERVISION AREDS 2 PO) Take 1 capsule by mouth 2 (two) times daily.      omega-3 acid ethyl esters (LOVAZA) 1 g capsule Take by mouth 2 (two) times daily.     prednisoLONE acetate (PRED FORTE) 1 % ophthalmic suspension Place 1 drop into the right eye 4 (four) times daily.     sertraline (ZOLOFT) 50 MG tablet Take 50 mg by mouth daily.     tamsulosin (FLOMAX) 0.4 MG CAPS capsule Take 1 capsule (0.4 mg total) by mouth daily after supper. 90 capsule 3   telmisartan (MICARDIS) 40 MG tablet Take 40 mg by mouth  daily.     triamcinolone (NASACORT) 55 MCG/ACT AERO nasal inhaler Place 2 sprays into the nose 2 (two) times daily as needed (allergies).      No current facility-administered medications for this visit.   Facility-Administered Medications Ordered in Other Visits  Medication Dose Route Frequency Provider Last Rate Last Admin   epoetin alfa-epbx (RETACRIT) injection 40,000 Units  40,000 Units Subcutaneous Once Jeralyn Ruths, MD        OBJECTIVE: Vitals:   04/11/23 1009  BP: 129/66  Pulse: (!) 55  Resp: 16  Temp: (!) 96.9 F (36.1 C)  SpO2: 100%      Body mass index is 25.29 kg/m.    ECOG FS:0 - Asymptomatic  General: Well-developed, well-nourished, no acute distress. Eyes: Pink conjunctiva, anicteric sclera. HEENT: Normocephalic, moist mucous membranes. Lungs: No audible wheezing or coughing. Heart: Regular rate and rhythm. Abdomen: Soft, nontender, no obvious distention. Musculoskeletal: No edema, cyanosis, or clubbing. Neuro: Alert, answering all questions appropriately. Cranial nerves grossly intact. Skin: No rashes or petechiae noted. Psych: Normal affect.  LAB RESULTS:  Lab Results  Component Value Date   NA  136 12/19/2022   K 3.7 12/19/2022   CL 106 12/19/2022   CO2 24 12/19/2022   GLUCOSE 80 12/19/2022   BUN 26 (H) 12/19/2022   CREATININE 2.04 (H) 12/19/2022   CALCIUM 7.2 (L) 12/19/2022   PROT 4.6 (L) 12/15/2022   ALBUMIN 2.8 (L) 12/15/2022   AST 15 12/15/2022   ALT 11 12/15/2022   ALKPHOS 91 12/15/2022   BILITOT 0.5 12/15/2022   GFRNONAA 31 (L) 12/19/2022   GFRAA 37 (L) 11/19/2019    Lab Results  Component Value Date   WBC 6.7 04/11/2023   NEUTROABS 1.5 (L) 04/11/2023   HGB 8.3 (L) 04/11/2023   HCT 26.5 (L) 04/11/2023   MCV 105.6 (H) 04/11/2023   PLT 32 (L) 04/11/2023   Lab Results  Component Value Date   IRON 67 12/15/2022   TIBC 251 12/15/2022   IRONPCTSAT 27 12/15/2022   Lab Results  Component Value Date   FERRITIN 31 08/25/2021     STUDIES: No results found.  ASSESSMENT: Rai stage I CLL, anemia secondary to chronic renal insufficiency.  PLAN:    Anemia secondary to chronic renal insufficiency: Chronic and unchanged.  Patient's hemoglobin is 8.3 today.  Previously all of his other laboratory work including iron panel are either negative or within normal limits.  Proceed with Retacrit today.  Patient has been instructed to follow-up as previously scheduled. CLL: Chronic and unchanged.  Patient's total white blood cell count has ranged between 9.4 and 24.8 since May 2013.  Today's result was 6.7.  No intervention is needed.  Patient does not require bone marrow biopsy or repeat imaging.  Continue simple observation. Thrombocytopenia: Patient's platelet count remains decreased at 32 today.  His count was as high as 95 in June 2016.  But more recently between has ranged from 32-63 since June 2019.  Hesitant to use steroids given his underlying diabetes and is unclear if Rituxan will be effective.  Have recommended platelet transfusion the day before and the day of his aortic aneurysm repair.  Lymphadenopathy: CT scan results from April 09, 2017 reviewed independently with  enlarged pelvic and retroperitoneal lymph nodes. These were also noted on a scan in 2012 appear to be slightly larger. No intervention is needed. No further imaging is necessary unless there is suspicion of progression of disease.  Disposition: Follow-up as above.  Will  attempt to coordinate visits with patient's wife.  I spent a total of 30 minutes reviewing chart data, face-to-face evaluation with the patient, counseling and coordination of care as detailed above.  Patient expressed understanding and was in agreement with this plan. He also understands that He can call clinic at any time with any questions, concerns, or complaints.   Jeralyn Ruths, MD   04/11/2023 5:27 PM

## 2023-04-11 NOTE — Progress Notes (Signed)
Has an aortic aneurism that is 5 cm and Dr. Wyn Quaker wants to do surgery. Worried about platelets. Has stage 3 kidney failure and needs contrast dye for surgery which is another concern.

## 2023-05-11 ENCOUNTER — Other Ambulatory Visit: Payer: Self-pay | Admitting: *Deleted

## 2023-05-11 DIAGNOSIS — D696 Thrombocytopenia, unspecified: Secondary | ICD-10-CM

## 2023-05-11 DIAGNOSIS — D631 Anemia in chronic kidney disease: Secondary | ICD-10-CM

## 2023-05-12 ENCOUNTER — Inpatient Hospital Stay: Payer: Medicare Other

## 2023-05-12 ENCOUNTER — Inpatient Hospital Stay: Payer: Medicare Other | Attending: Oncology

## 2023-05-12 VITALS — BP 129/69 | HR 53

## 2023-05-12 DIAGNOSIS — C9110 Chronic lymphocytic leukemia of B-cell type not having achieved remission: Secondary | ICD-10-CM | POA: Diagnosis present

## 2023-05-12 DIAGNOSIS — N1832 Chronic kidney disease, stage 3b: Secondary | ICD-10-CM | POA: Insufficient documentation

## 2023-05-12 DIAGNOSIS — D631 Anemia in chronic kidney disease: Secondary | ICD-10-CM

## 2023-05-12 LAB — CBC WITH DIFFERENTIAL/PLATELET
Abs Immature Granulocytes: 0.01 10*3/uL (ref 0.00–0.07)
Basophils Absolute: 0 10*3/uL (ref 0.0–0.1)
Basophils Relative: 0 %
Eosinophils Absolute: 0.1 10*3/uL (ref 0.0–0.5)
Eosinophils Relative: 1 %
HCT: 25.5 % — ABNORMAL LOW (ref 39.0–52.0)
Hemoglobin: 8 g/dL — ABNORMAL LOW (ref 13.0–17.0)
Immature Granulocytes: 0 %
Lymphocytes Relative: 69 %
Lymphs Abs: 7.7 10*3/uL — ABNORMAL HIGH (ref 0.7–4.0)
MCH: 33.5 pg (ref 26.0–34.0)
MCHC: 31.4 g/dL (ref 30.0–36.0)
MCV: 106.7 fL — ABNORMAL HIGH (ref 80.0–100.0)
Monocytes Absolute: 0.9 10*3/uL (ref 0.1–1.0)
Monocytes Relative: 8 %
Neutro Abs: 2.4 10*3/uL (ref 1.7–7.7)
Neutrophils Relative %: 22 %
Platelets: 37 10*3/uL — ABNORMAL LOW (ref 150–400)
RBC: 2.39 MIL/uL — ABNORMAL LOW (ref 4.22–5.81)
RDW: 15.1 % (ref 11.5–15.5)
Smear Review: DECREASED
WBC: 11.1 10*3/uL — ABNORMAL HIGH (ref 4.0–10.5)
nRBC: 0 % (ref 0.0–0.2)

## 2023-05-12 MED ORDER — EPOETIN ALFA-EPBX 40000 UNIT/ML IJ SOLN
40000.0000 [IU] | Freq: Once | INTRAMUSCULAR | Status: AC
  Administered 2023-05-12: 40000 [IU] via SUBCUTANEOUS
  Filled 2023-05-12: qty 1

## 2023-05-22 ENCOUNTER — Ambulatory Visit
Admission: RE | Admit: 2023-05-22 | Discharge: 2023-05-22 | Disposition: A | Discharge status: Home / Self Care | Payer: Medicare Other | Source: Ambulatory Visit | Attending: Vascular Surgery | Admitting: Vascular Surgery

## 2023-05-22 DIAGNOSIS — I7143 Infrarenal abdominal aortic aneurysm, without rupture: Secondary | ICD-10-CM | POA: Diagnosis not present

## 2023-05-30 ENCOUNTER — Ambulatory Visit (INDEPENDENT_AMBULATORY_CARE_PROVIDER_SITE_OTHER): Payer: Medicare Other

## 2023-05-30 ENCOUNTER — Ambulatory Visit (INDEPENDENT_AMBULATORY_CARE_PROVIDER_SITE_OTHER): Payer: Medicare Other | Admitting: Vascular Surgery

## 2023-05-30 ENCOUNTER — Encounter (INDEPENDENT_AMBULATORY_CARE_PROVIDER_SITE_OTHER): Payer: Self-pay | Admitting: Vascular Surgery

## 2023-05-30 ENCOUNTER — Other Ambulatory Visit (INDEPENDENT_AMBULATORY_CARE_PROVIDER_SITE_OTHER): Payer: Medicare Other

## 2023-05-30 VITALS — BP 162/78 | HR 52 | Resp 18 | Ht 66.0 in | Wt 183.0 lb

## 2023-05-30 DIAGNOSIS — I6523 Occlusion and stenosis of bilateral carotid arteries: Secondary | ICD-10-CM | POA: Diagnosis not present

## 2023-05-30 DIAGNOSIS — N1832 Chronic kidney disease, stage 3b: Secondary | ICD-10-CM

## 2023-05-30 DIAGNOSIS — E785 Hyperlipidemia, unspecified: Secondary | ICD-10-CM

## 2023-05-30 DIAGNOSIS — I7143 Infrarenal abdominal aortic aneurysm, without rupture: Secondary | ICD-10-CM | POA: Diagnosis not present

## 2023-05-30 DIAGNOSIS — I10 Essential (primary) hypertension: Secondary | ICD-10-CM | POA: Diagnosis not present

## 2023-05-30 DIAGNOSIS — E1142 Type 2 diabetes mellitus with diabetic polyneuropathy: Secondary | ICD-10-CM | POA: Diagnosis not present

## 2023-05-30 NOTE — Assessment & Plan Note (Signed)
The patient had an uninfused CT scan performed earlier this month that I have independently reviewed.  It was done without infusion due to his significant chronic kidney disease.  His abdominal aortic aneurysm measures approximately 5.3 cm in maximal diameter.  I have reviewed the images with the patient and his wife today. I had a long discussion with the 2 of them as well as with her daughter on the phone.  He is still hesitant to proceed with abdominal aortic aneurysm repair at this time due to his multiple issues most notably his renal insufficiency and thrombocytopenia.  I can certainly understand this.  I did discuss that he has shown continued growth and even grown 2 to 3 mm in the last 3 to 4 months.  We will continue 6-month interval follow-up with a CT scan.  I have told him I think we should essentially set a line in the sand (potentially at 5.5 cm) at which point I think we should proceed with aneurysm repair. They are in agreement and will discuss the size that they would want repair as a family at home.

## 2023-05-30 NOTE — Progress Notes (Signed)
MRN : [id number]  Patrick Payne. is a 89 y.o. (Nov 08, 1933) male who presents with chief complaint of  Chief Complaint  Patient presents with   Follow-up    f/u in 1 year with carotid - + CT results  .  History of Present Illness: Patient returns today in follow up of multiple vascular issues.  He is doing reasonably well today.  He has a litany of medical issues as listed below that we have discussed extensively in the past.  He has not had any focal neurologic symptoms. Specifically, the patient denies amaurosis fugax, speech or swallowing difficulties, or arm or leg weakness or numbness.  Carotid duplex today reveals stable 1 to 39% ICA stenosis bilaterally.  Current Outpatient Medications  Medication Sig Dispense Refill   Aflibercept 2 MG/0.05ML SOLN Inject 1 Dose into the eye as directed. One injection into left eye every 8-9 weeks     allopurinol (ZYLOPRIM) 100 MG tablet Take 100 mg by mouth daily.     atorvastatin (LIPITOR) 10 MG tablet Take 10 mg by mouth at bedtime.      BD PEN NEEDLE NANO 2ND GEN 32G X 4 MM MISC USE AS DIRECTED ONCE A DAY     carvedilol (COREG) 12.5 MG tablet Take 12.5 mg by mouth 2 (two) times daily with a meal.     Cholecalciferol (VITAMIN D-3) 5000 units TABS Take 5,000 Units by mouth at bedtime.      clonazePAM (KLONOPIN) 1 MG tablet Take 1 mg by mouth at bedtime.      CONTOUR NEXT TEST test strip daily.     cyanocobalamin 500 MCG tablet Take 1,000 mcg by mouth at bedtime.      empagliflozin (JARDIANCE) 10 MG TABS tablet 10 mg daily.     epoetin alfa-epbx (RETACRIT) 30865 UNIT/ML injection Inject 40,000 Units into the skin every 6 (six) weeks. Depending on lab results (if needed)     fexofenadine (ALLEGRA) 180 MG tablet Take 180 mg by mouth daily as needed for allergies.     finasteride (PROSCAR) 5 MG tablet Take 1 tablet (5 mg total) by mouth daily. 90 tablet 3   folic acid (FOLVITE) 400 MCG tablet Take 400 mcg by mouth daily.     insulin glargine  (LANTUS) 100 UNIT/ML injection Inject 20-30 Units into the skin every morning. Takes Lantus 20 units if CBG less than 120 mg/dl or Lantus 30 units if CBG is over 120 mg/dl     LORazepam (ATIVAN) 1 MG tablet Take 0.5 mg by mouth every 8 (eight) hours as needed for anxiety (takes 1 tablet every morning.  Rarely has to take again during the day).     Multiple Vitamins-Minerals (PRESERVISION AREDS 2 PO) Take 1 capsule by mouth 2 (two) times daily.      omega-3 acid ethyl esters (LOVAZA) 1 g capsule Take by mouth 2 (two) times daily.     prednisoLONE acetate (PRED FORTE) 1 % ophthalmic suspension Place 1 drop into the right eye 4 (four) times daily.     sertraline (ZOLOFT) 50 MG tablet Take 100 mg by mouth daily.     tamsulosin (FLOMAX) 0.4 MG CAPS capsule Take 1 capsule (0.4 mg total) by mouth daily after supper. 90 capsule 3   telmisartan (MICARDIS) 40 MG tablet Take 40 mg by mouth daily.     triamcinolone (NASACORT) 55 MCG/ACT AERO nasal inhaler Place 2 sprays into the nose 2 (two) times daily as needed (allergies).  No current facility-administered medications for this visit.   Facility-Administered Medications Ordered in Other Visits  Medication Dose Route Frequency Provider Last Rate Last Admin   epoetin alfa-epbx (RETACRIT) injection 40,000 Units  40,000 Units Subcutaneous Once Jeralyn Ruths, MD        Past Medical History:  Diagnosis Date   Anxiety    CKD (chronic kidney disease), stage III (HCC)    CLL (chronic lymphocytic leukemia) (HCC)    Diabetes mellitus without complication (HCC)    Diverticulitis 04/09/2017   History of kidney stones    HLD (hyperlipidemia)    HTN (hypertension)    PKD (polycystic kidney disease)    Sleep apnea    Uncontrolled type 2 diabetes mellitus with hyperglycemia (HCC) 06/28/2018    Past Surgical History:  Procedure Laterality Date   CARDIAC CATHETERIZATION  12/1987   CENTRAL LINE INSERTION N/A 04/11/2017   Procedure: Central Line  Insertion;  Surgeon: Renford Dills, MD;  Location: [facility];  Service: Cardiovascular;  Laterality: N/A;   CERVICAL FUSION     CHOLECYSTECTOMY     CYSTOSCOPY W/ RETROGRADES Left 05/12/2017   Procedure: CYSTOSCOPY WITH RETROGRADE PYELOGRAM;  Surgeon: Hildred Laser, MD;  Location: [facility];  Service: Urology;  Laterality: Left;   CYSTOSCOPY W/ URETERAL STENT PLACEMENT Left 05/12/2017   Procedure: CYSTOSCOPY WITH STENT REMOVAL;  Surgeon: Hildred Laser, MD;  Location: [facility];  Service: Urology;  Laterality: Left;   CYSTOSCOPY WITH STENT PLACEMENT Left 04/11/2017   Procedure: CYSTOSCOPY WITH STENT PLACEMENT;  Surgeon: Jerilee Field, MD;  Location: [facility];  Service: Urology;  Laterality: Left;   URETEROSCOPY  05/12/2017   Procedure: URETEROSCOPY;  Surgeon: Hildred Laser, MD;  Location: [facility];  Service: Urology;;     Social History   Tobacco Use   Smoking status: Former    Passive exposure: Past   Smokeless tobacco: Never   Tobacco comments:    quit in Feb of 1997  Substance Use Topics   Alcohol use: No   Drug use: No      Family History  Problem Relation Age of Onset   Cancer Mother    Varicose Veins Mother    Cancer Father      Allergies  Allergen Reactions   Celecoxib Other (See Comments)    Increased Blood Pressure   Chlorpromazine Nausea Only   Iodinated Contrast Media Other (See Comments)    Stage 3 kidney disease, told IV dye would be bad for him   Prednisone Other (See Comments)    Diabetic   Amoxicillin-Pot Clavulanate Rash    Has patient had a PCN reaction causing immediate rash, facial/tongue/throat swelling, SOB or lightheadedness with hypotension: No Has patient had a PCN reaction causing severe rash involving mucus membranes or skin necrosis: No Has patient had a PCN reaction that required hospitalization: No Has patient had a PCN reaction occurring within the last 10 years: Yes If all of the above answers are "NO", then  may proceed with Cephalosporin use.    Penicillin V Potassium Rash     REVIEW OF SYSTEMS (Negative unless checked)   Constitutional: [] Weight loss  [] Fever  [] Chills Cardiac: [] Chest pain   [] Chest pressure   [] Palpitations   [] Shortness of breath when laying flat   [] Shortness of breath at rest   [] Shortness of breath with exertion. Vascular:  [] Pain in legs with walking   [] Pain in legs at rest   [] Pain in legs when laying flat   []   Claudication   [] Pain in feet when walking  [] Pain in feet at rest  [] Pain in feet when laying flat   [] History of DVT   [] Phlebitis   [] Swelling in legs   [] Varicose veins   [] Non-healing ulcers Pulmonary:   [] Uses home oxygen   [] Productive cough   [] Hemoptysis   [] Wheeze  [] COPD   [] Asthma Neurologic:  [] Dizziness  [] Blackouts   [] Seizures   [] History of stroke   [] History of TIA  [] Aphasia   [] Temporary blindness   [] Dysphagia   [] Weakness or numbness in arms   [] Weakness or numbness in legs Musculoskeletal:  [x] Arthritis   [] Joint swelling   [x] Joint pain   [] Low back pain Hematologic:  [] Easy bruising  [] Easy bleeding   [] Hypercoagulable state   [x] Anemic   Gastrointestinal:  [] Blood in stool   [] Vomiting blood  [x] Gastroesophageal reflux/heartburn   [] Abdominal pain Genitourinary:  [x] Chronic kidney disease   [] Difficult urination  [] Frequent urination  [] Burning with urination   [] Hematuria Skin:  [] Rashes   [] Ulcers   [] Wounds Psychological:  [x] History of anxiety   []  History of major depression.  Physical Examination  BP (!) 162/78 (BP Location: Left Arm)   Pulse (!) 52   Resp 18   Ht 5\' 6"  (1.676 m)   Wt 183 lb (83 kg)   BMI 29.54 kg/m  Gen:  WD/WN, NAD Head: [place]/AT, No temporalis wasting. Ear/Nose/Throat: Hearing grossly intact, nares w/o erythema or drainage Eyes: Conjunctiva clear. Sclera non-icteric Neck: Supple.  Trachea midline Pulmonary:  Good air movement, no use of accessory muscles.  Cardiac: RRR, no JVD Vascular:  Vessel Right  Left  Radial Palpable Palpable                                   Gastrointestinal: soft, non-tender/non-distended. No guarding/reflex.  Musculoskeletal: M/S 5/5 throughout.  No deformity or atrophy. Trace LE edema. Neurologic: Sensation grossly intact in extremities.  Symmetrical.  Speech is fluent.  Psychiatric: Judgment intact, Mood & affect appropriate for pt's clinical situation. Dermatologic: No rashes or ulcers noted.  No cellulitis or open wounds.      Labs Recent Results (from the past 2160 hour(s))  CBC with Differential     Status: Abnormal   Collection Time: 03/13/23  1:25 PM  Result Value Ref Range   WBC 8.8 4.0 - 10.5 K/uL   RBC 2.56 (L) 4.22 - 5.81 MIL/uL   Hemoglobin 8.5 (L) 13.0 - 17.0 g/dL   HCT 86.5 (L) 78.4 - 69.6 %   MCV 104.7 (H) 80.0 - 100.0 fL   MCH 33.2 26.0 - 34.0 pg   MCHC 31.7 30.0 - 36.0 g/dL   RDW 29.5 28.4 - 13.2 %   Platelets 34 (L) 150 - 400 K/uL    Comment: Immature Platelet Fraction may be clinically indicated, consider ordering this additional test [id number]    nRBC 0.0 0.0 - 0.2 %   Neutrophils Relative % 12 %   Lymphocytes Relative 81 %   Monocytes Relative 6 %   Eosinophils Relative 1 %   Basophils Relative 0 %   Band Neutrophils 0 %   Metamyelocytes Relative 0 %   Myelocytes 0 %   Promyelocytes Relative 0 %   Blasts 0 %   nRBC 0 0 /100 WBC   Other 0 %   Neutro Abs 1.1 (L) 1.7 - 7.7 K/uL   Lymphs Abs 7.1 (H) 0.7 -  4.0 K/uL   Monocytes Absolute 0.5 0.1 - 1.0 K/uL   Eosinophils Absolute 0.1 0.0 - 0.5 K/uL   Basophils Absolute 0.0 0.0 - 0.1 K/uL   Abs Immature Granulocytes 0.00 0.00 - 0.07 K/uL   RBC Morphology MORPHOLOGY UNREMARKABLE    Smear Review DIFF CONFIRMED BY SMEAR     Comment: Performed at [facility], [address] [place], [place] [address]  CBC with Differential (Cancer Center Only)     Status: Abnormal   Collection Time: 04/11/23 10:02 AM  Result Value Ref Range   WBC Count 6.7 4.0 - 10.5 K/uL    RBC 2.51 (L) 4.22 - 5.81 MIL/uL   Hemoglobin 8.3 (L) 13.0 - 17.0 g/dL   HCT 60.4 (L) 54.0 - 98.1 %   MCV 105.6 (H) 80.0 - 100.0 fL   MCH 33.1 26.0 - 34.0 pg   MCHC 31.3 30.0 - 36.0 g/dL   RDW 19.1 47.8 - 29.5 %   Platelet Count 32 (L) 150 - 400 K/uL    Comment: Immature Platelet Fraction may be clinically indicated, consider ordering this additional test [id number]    nRBC 0.0 0.0 - 0.2 %   Neutrophils Relative % 23 %   Neutro Abs 1.5 (L) 1.7 - 7.7 K/uL   Lymphocytes Relative 68 %   Lymphs Abs 4.5 (H) 0.7 - 4.0 K/uL   Monocytes Relative 8 %   Monocytes Absolute 0.5 0.1 - 1.0 K/uL   Eosinophils Relative 1 %   Eosinophils Absolute 0.1 0.0 - 0.5 K/uL   Basophils Relative 0 %   Basophils Absolute 0.0 0.0 - 0.1 K/uL   WBC Morphology SMUDGE CELLS     Comment: CONSISTANT WITH KNOWN CLL.   RBC Morphology UNREMARKABLE    Smear Review Normal platelet morphology     Comment: PLATELETS APPEAR DECREASED   Immature Granulocytes 0 %   Abs Immature Granulocytes 0.01 0.00 - 0.07 K/uL    Comment: Performed at [facility], [address] [place], [place] [address]  CBC with Differential/Platelet     Status: Abnormal   Collection Time: 05/12/23 12:52 PM  Result Value Ref Range   WBC 11.1 (H) 4.0 - 10.5 K/uL   RBC 2.39 (L) 4.22 - 5.81 MIL/uL   Hemoglobin 8.0 (L) 13.0 - 17.0 g/dL   HCT 62.9 (L) 52.8 - 41.3 %   MCV 106.7 (H) 80.0 - 100.0 fL   MCH 33.5 26.0 - 34.0 pg   MCHC 31.4 30.0 - 36.0 g/dL   RDW 24.4 01.0 - 27.2 %   Platelets 37 (L) 150 - 400 K/uL    Comment: Immature Platelet Fraction may be clinically indicated, consider ordering this additional test [id number]    nRBC 0.0 0.0 - 0.2 %   Neutrophils Relative % 22 %   Neutro Abs 2.4 1.7 - 7.7 K/uL   Lymphocytes Relative 69 %   Lymphs Abs 7.7 (H) 0.7 - 4.0 K/uL   Monocytes Relative 8 %   Monocytes Absolute 0.9 0.1 - 1.0 K/uL   Eosinophils Relative 1 %   Eosinophils Absolute 0.1 0.0 - 0.5 K/uL   Basophils Relative 0 %    Basophils Absolute 0.0 0.0 - 0.1 K/uL   WBC Morphology SMUDGE CELLS    Smear Review PLATELETS APPEAR DECREASED    Immature Granulocytes 0 %   Abs Immature Granulocytes 0.01 0.00 - 0.07 K/uL   Tear Drop Cells PRESENT     Comment: Performed at [facility], [address] [place], [place] [address]    Radiology VAS US CAROTID  Result Date: 05/30/2023 Carotid Arterial Duplex Study Patient Name:  Corie Pour [facility].  Date of Exam:   05/30/2023 Medical Rec #: [id number]              Accession #:    [id number] Date of Birth: 10-04-1934             Patient Gender: M Patient Age:   72 years Exam Location:  [facility] Procedure:      VAS US CAROTID Referring Phys: Festus Barren --------------------------------------------------------------------------------  Indications:  Carotid artery disease. Risk Factors: Hypertension. Performing Technologist: Salvadore Farber RVT  Examination Guidelines: A complete evaluation includes B-mode imaging, spectral Doppler, color Doppler, and power Doppler as needed of all accessible portions of each vessel. Bilateral testing is considered an integral part of a complete examination. Limited examinations for reoccurring indications may be performed as noted.  Right Carotid Findings: +----------+--------+--------+--------+-------------------+--------+           PSV cm/sEDV cm/sStenosisPlaque Description Comments +----------+--------+--------+--------+-------------------+--------+ CCA Prox  68      13                                          +----------+--------+--------+--------+-------------------+--------+ CCA Mid   67      13                                          +----------+--------+--------+--------+-------------------+--------+ CCA Distal63      13                                          +----------+--------+--------+--------+-------------------+--------+ ICA Prox  60      18      1-39%   calcific and smooth          +----------+--------+--------+--------+-------------------+--------+ ICA Mid   68      19                                          +----------+--------+--------+--------+-------------------+--------+ ICA Distal59      15                                          +----------+--------+--------+--------+-------------------+--------+ ECA       63      11                                          +----------+--------+--------+--------+-------------------+--------+ +----------+--------+-------+----------------+-------------------+           PSV cm/sEDV cmsDescribe        Arm Pressure (mmHG) +----------+--------+-------+----------------+-------------------+ [id number]             Multiphasic, WNL                    +----------+--------+-------+----------------+-------------------+ +---------+--------+--+--------+--+---------+ VertebralPSV cm/s55EDV cm/s16Antegrade +---------+--------+--+--------+--+---------+  Left Carotid Findings: +----------+--------+--------+--------+-------------------+--------+  PSV cm/sEDV cm/sStenosisPlaque Description Comments +----------+--------+--------+--------+-------------------+--------+ CCA Prox  79      18                                          +----------+--------+--------+--------+-------------------+--------+ CCA Mid   83      12                                          +----------+--------+--------+--------+-------------------+--------+ CCA Distal92      17                                          +----------+--------+--------+--------+-------------------+--------+ ICA Prox  71      20      1-39%   calcific and smooth         +----------+--------+--------+--------+-------------------+--------+ ICA Mid   76      22                                          +----------+--------+--------+--------+-------------------+--------+ ICA Distal87      24                                           +----------+--------+--------+--------+-------------------+--------+ ECA       48      1                                           +----------+--------+--------+--------+-------------------+--------+ +----------+--------+--------+----------------+-------------------+           PSV cm/sEDV cm/sDescribe        Arm Pressure (mmHG) +----------+--------+--------+----------------+-------------------+ [id number]             Multiphasic, WNL                    +----------+--------+--------+----------------+-------------------+ +---------+--------+--+--------+--+---------+ VertebralPSV cm/s49EDV cm/s14Antegrade +---------+--------+--+--------+--+---------+   Summary: Right Carotid: Velocities in the right ICA are consistent with a 1-39% stenosis.                Non-hemodynamically significant plaque <50% noted in the CCA. The                ECA appears <50% stenosed. Left Carotid: Velocities in the left ICA are consistent with a 1-39% stenosis.               Non-hemodynamically significant plaque <50% noted in the CCA. The               ECA appears <50% stenosed. Vertebrals:  Bilateral vertebral arteries demonstrate antegrade flow. Subclavians: Normal flow hemodynamics were seen in bilateral subclavian              arteries. *See table(s) above for measurements and observations.  Electronically signed by Festus Barren MD on 05/30/2023 at 3:51:17 PM.    Final    CT ABDOMEN PELVIS WO CONTRAST  Result Date: 05/28/2023 CLINICAL DATA:  AAA  follow up. EXAM: CT ABDOMEN AND PELVIS WITHOUT CONTRAST TECHNIQUE: Multidetector CT imaging of the abdomen and pelvis was performed following the standard protocol without IV contrast. RADIATION DOSE REDUCTION: This exam was performed according to the departmental dose-optimization program which includes automated exposure control, adjustment of the mA and/or kV according to patient size and/or use of iterative reconstruction technique. COMPARISON:  12/14/2022 and  04/09/2017. FINDINGS: Lower chest: Trace pericardial effusion or thickening. No pleural effusion. Atheromatous calcification of the coronary arteries and aorta. Hepatobiliary: No focal liver abnormality is seen. Status post cholecystectomy. No biliary dilatation. Pancreas: Unremarkable. No pancreatic ductal dilatation or surrounding inflammatory changes. Spleen: Enlarged, 17 cm. Adrenals/Urinary Tract: Massively enlarged diffusely cystic right kidney up to 22 cm consistent with cystic dysplastic changes. No similar findings are seen of the left kidney where there was only a couple of small 2-3 cm cysts identified. Stomach/Bowel: Normal appendix. Diverticulosis descending and sigmoid colon. No bowel dilatation to suggest obstruction. No pericolonic mesenteric inflammatory changes. Vascular/Lymphatic: Mid to distal abdominal aortic aneurysm again noted with outer diameter now measuring 5.3 x 5.0 cm which represents no significant change compared to the prior study from March 2024. Six-month follow up CT recommended as well as Vascular management. Diffuse atheromatous calcifications of the aorta. No suspicious adenopathy. Reproductive: Prostate is unremarkable. Other: No abdominal wall hernia or abnormality. No abdominopelvic ascites. Musculoskeletal: There are thoracolumbosacral degenerative changes. IMPRESSION: 1. No significant change in 5.3 cm mid to distal abdominal aortic aneurysm. Management by Vascular and six-month follow up recommended. 2. Massively enlarged dysplastic cystic right kidney. 3. Splenomegaly. 4. Diverticulosis. Electronically Signed   By: Layla Maw M.D.   On: 05/28/2023 10:58    Assessment/Plan Diabetes mellitus (HCC) blood glucose control important in reducing the progression of atherosclerotic disease. Also, involved in wound healing. On appropriate medications.     BP (high blood pressure) blood pressure control important in reducing the progression of atherosclerotic disease  and aneurysmal disease. On appropriate oral medications.   Chronic kidney disease (CKD), stage III (moderate) Hydrate and limit contrast when and if we repair.   HLD (hyperlipidemia) lipid control important in reducing the progression of atherosclerotic disease. Continue statin therapy   Carotid stenosis Carotid duplex today shows stable 1 to 39% ICA stenosis bilaterally without significant progression from previous studies.  No increased risk of stroke.  Recheck in 1 year.  Abdominal aortic aneurysm (AAA) (HCC) The patient had an uninfused CT scan performed earlier this month that I have independently reviewed.  It was done without infusion due to his significant chronic kidney disease.  His abdominal aortic aneurysm measures approximately 5.3 cm in maximal diameter.  I have reviewed the images with the patient and his wife today. I had a long discussion with the 2 of them as well as with her daughter on the phone.  He is still hesitant to proceed with abdominal aortic aneurysm repair at this time due to his multiple issues most notably his renal insufficiency and thrombocytopenia.  I can certainly understand this.  I did discuss that he has shown continued growth and even grown 2 to 3 mm in the last 3 to 4 months.  We will continue 36-month interval follow-up with a CT scan.  I have told him I think we should essentially set a line in the sand (potentially at 5.5 cm) at which point I think we should proceed with aneurysm repair. They are in agreement and will discuss the size that they would want repair as  a family at home.     Festus Barren, MD  05/30/2023 5:49 PM    This note was created with Dragon medical transcription system.  Any errors from dictation are purely unintentional

## 2023-06-09 ENCOUNTER — Other Ambulatory Visit: Payer: Self-pay | Admitting: *Deleted

## 2023-06-09 DIAGNOSIS — D696 Thrombocytopenia, unspecified: Secondary | ICD-10-CM

## 2023-06-13 ENCOUNTER — Inpatient Hospital Stay: Payer: Medicare Other

## 2023-06-15 ENCOUNTER — Inpatient Hospital Stay: Payer: Medicare Other

## 2023-06-22 ENCOUNTER — Inpatient Hospital Stay: Payer: Medicare Other

## 2023-06-22 ENCOUNTER — Inpatient Hospital Stay: Payer: Medicare Other | Attending: Oncology

## 2023-06-22 ENCOUNTER — Other Ambulatory Visit: Payer: Self-pay | Admitting: *Deleted

## 2023-06-22 ENCOUNTER — Other Ambulatory Visit: Payer: Self-pay | Admitting: Oncology

## 2023-06-22 ENCOUNTER — Ambulatory Visit: Payer: Medicare Other

## 2023-06-22 VITALS — BP 138/52 | HR 58

## 2023-06-22 DIAGNOSIS — D631 Anemia in chronic kidney disease: Secondary | ICD-10-CM | POA: Diagnosis present

## 2023-06-22 DIAGNOSIS — C9110 Chronic lymphocytic leukemia of B-cell type not having achieved remission: Secondary | ICD-10-CM | POA: Diagnosis present

## 2023-06-22 DIAGNOSIS — N1832 Chronic kidney disease, stage 3b: Secondary | ICD-10-CM | POA: Insufficient documentation

## 2023-06-22 DIAGNOSIS — D696 Thrombocytopenia, unspecified: Secondary | ICD-10-CM

## 2023-06-22 LAB — CBC WITH DIFFERENTIAL/PLATELET
Abs Immature Granulocytes: 0.02 10*3/uL (ref 0.00–0.07)
Basophils Absolute: 0 10*3/uL (ref 0.0–0.1)
Basophils Relative: 0 %
Eosinophils Absolute: 0.1 10*3/uL (ref 0.0–0.5)
Eosinophils Relative: 1 %
HCT: 23 % — ABNORMAL LOW (ref 39.0–52.0)
Hemoglobin: 7 g/dL — ABNORMAL LOW (ref 13.0–17.0)
Immature Granulocytes: 0 %
Lymphocytes Relative: 72 %
Lymphs Abs: 7 10*3/uL — ABNORMAL HIGH (ref 0.7–4.0)
MCH: 32.9 pg (ref 26.0–34.0)
MCHC: 30.4 g/dL (ref 30.0–36.0)
MCV: 108 fL — ABNORMAL HIGH (ref 80.0–100.0)
Monocytes Absolute: 0.2 10*3/uL (ref 0.1–1.0)
Monocytes Relative: 2 %
Neutro Abs: 2.5 10*3/uL (ref 1.7–7.7)
Neutrophils Relative %: 25 %
Platelets: 36 10*3/uL — ABNORMAL LOW (ref 150–400)
RBC: 2.13 MIL/uL — ABNORMAL LOW (ref 4.22–5.81)
RDW: 15.5 % (ref 11.5–15.5)
WBC: 9.8 10*3/uL (ref 4.0–10.5)
nRBC: 0 % (ref 0.0–0.2)

## 2023-06-22 LAB — PREPARE RBC (CROSSMATCH)

## 2023-06-22 LAB — ABO/RH: ABO/RH(D): A POS

## 2023-06-22 MED ORDER — EPOETIN ALFA-EPBX 40000 UNIT/ML IJ SOLN
40000.0000 [IU] | Freq: Once | INTRAMUSCULAR | Status: AC
  Administered 2023-06-22: 40000 [IU] via SUBCUTANEOUS
  Filled 2023-06-22: qty 1

## 2023-06-23 ENCOUNTER — Other Ambulatory Visit: Payer: Self-pay | Admitting: *Deleted

## 2023-06-23 ENCOUNTER — Inpatient Hospital Stay: Payer: Medicare Other

## 2023-06-23 DIAGNOSIS — D631 Anemia in chronic kidney disease: Secondary | ICD-10-CM

## 2023-06-23 DIAGNOSIS — N1832 Chronic kidney disease, stage 3b: Secondary | ICD-10-CM | POA: Diagnosis not present

## 2023-06-23 DIAGNOSIS — D696 Thrombocytopenia, unspecified: Secondary | ICD-10-CM

## 2023-06-23 MED ORDER — DIPHENHYDRAMINE HCL 50 MG/ML IJ SOLN
25.0000 mg | Freq: Once | INTRAMUSCULAR | Status: AC
  Administered 2023-06-23: 25 mg via INTRAVENOUS
  Filled 2023-06-23: qty 1

## 2023-06-23 MED ORDER — SODIUM CHLORIDE 0.9% IV SOLUTION
250.0000 mL | Freq: Once | INTRAVENOUS | Status: AC
  Administered 2023-06-23: 250 mL via INTRAVENOUS
  Filled 2023-06-23: qty 250

## 2023-06-23 MED ORDER — ACETAMINOPHEN 325 MG PO TABS
650.0000 mg | ORAL_TABLET | Freq: Once | ORAL | Status: AC
  Administered 2023-06-23: 650 mg via ORAL
  Filled 2023-06-23: qty 2

## 2023-06-23 NOTE — Patient Instructions (Signed)

## 2023-06-24 LAB — BPAM RBC
Blood Product Expiration Date: 202410042359
ISSUE DATE / TIME: 202409131005
Unit Type and Rh: 6200

## 2023-06-24 LAB — TYPE AND SCREEN
ABO/RH(D): A POS
Antibody Screen: NEGATIVE
Unit division: 0

## 2023-06-27 ENCOUNTER — Encounter (INDEPENDENT_AMBULATORY_CARE_PROVIDER_SITE_OTHER): Payer: Medicare Other

## 2023-06-27 ENCOUNTER — Ambulatory Visit (INDEPENDENT_AMBULATORY_CARE_PROVIDER_SITE_OTHER): Payer: Medicare Other | Admitting: Vascular Surgery

## 2023-07-05 ENCOUNTER — Inpatient Hospital Stay: Payer: Medicare Other

## 2023-07-05 ENCOUNTER — Encounter: Payer: Self-pay | Admitting: Oncology

## 2023-07-05 ENCOUNTER — Inpatient Hospital Stay (HOSPITAL_BASED_OUTPATIENT_CLINIC_OR_DEPARTMENT_OTHER): Payer: Medicare Other | Admitting: Oncology

## 2023-07-05 ENCOUNTER — Other Ambulatory Visit: Payer: Self-pay | Admitting: *Deleted

## 2023-07-05 ENCOUNTER — Inpatient Hospital Stay: Payer: Medicare Other | Admitting: Oncology

## 2023-07-05 VITALS — BP 147/65 | HR 51 | Temp 97.3°F | Resp 16 | Ht 66.0 in | Wt 176.0 lb

## 2023-07-05 DIAGNOSIS — N1832 Chronic kidney disease, stage 3b: Secondary | ICD-10-CM | POA: Diagnosis not present

## 2023-07-05 DIAGNOSIS — D631 Anemia in chronic kidney disease: Secondary | ICD-10-CM

## 2023-07-05 DIAGNOSIS — D696 Thrombocytopenia, unspecified: Secondary | ICD-10-CM | POA: Diagnosis not present

## 2023-07-05 DIAGNOSIS — N189 Chronic kidney disease, unspecified: Secondary | ICD-10-CM

## 2023-07-05 LAB — CBC WITH DIFFERENTIAL/PLATELET
Abs Immature Granulocytes: 0.02 10*3/uL (ref 0.00–0.07)
Basophils Absolute: 0 10*3/uL (ref 0.0–0.1)
Basophils Relative: 0 %
Eosinophils Absolute: 0.1 10*3/uL (ref 0.0–0.5)
Eosinophils Relative: 1 %
HCT: 26.8 % — ABNORMAL LOW (ref 39.0–52.0)
Hemoglobin: 8.2 g/dL — ABNORMAL LOW (ref 13.0–17.0)
Immature Granulocytes: 0 %
Lymphocytes Relative: 69 %
Lymphs Abs: 6.7 10*3/uL — ABNORMAL HIGH (ref 0.7–4.0)
MCH: 32.8 pg (ref 26.0–34.0)
MCHC: 30.6 g/dL (ref 30.0–36.0)
MCV: 107.2 fL — ABNORMAL HIGH (ref 80.0–100.0)
Monocytes Absolute: 0.8 10*3/uL (ref 0.1–1.0)
Monocytes Relative: 8 %
Neutro Abs: 2.1 10*3/uL (ref 1.7–7.7)
Neutrophils Relative %: 22 %
Platelets: 26 10*3/uL — ABNORMAL LOW (ref 150–400)
RBC: 2.5 MIL/uL — ABNORMAL LOW (ref 4.22–5.81)
RDW: 16.2 % — ABNORMAL HIGH (ref 11.5–15.5)
Smear Review: NORMAL
WBC: 9.7 10*3/uL (ref 4.0–10.5)
nRBC: 0 % (ref 0.0–0.2)

## 2023-07-05 LAB — SAMPLE TO BLOOD BANK

## 2023-07-05 LAB — PREPARE RBC (CROSSMATCH)

## 2023-07-05 NOTE — Progress Notes (Signed)
Has a possible dental procedure to have tooth removed today at 11:15. Wants to make sure platelets and hgb are good. Had blood coming out of his mouth this morning.

## 2023-07-05 NOTE — Progress Notes (Signed)
[facility]  Telephone:([phone] Fax:([phone]  ID: Patrick Catholic Egger Jr. OB: 12-Jun-1934  MR#: [id number]  QIO#:[id number]  Patient Care Team: Marguarite Arbour, MD as PCP - General (Internal Medicine) Jeralyn Ruths, MD as Consulting Physician (Oncology)  CHIEF COMPLAINT: CLL, anemia secondary to chronic renal insufficiency, thrombocytopenia.  INTERVAL HISTORY: Patient returns to clinic today for repeat laboratory work, further evaluation, and consideration of additional blood.  Patient reports he is going to require a tooth pulled in the near future and has an appointment with his oral surgeon today.  He continues to have chronic weakness and fatigue, but states this has improved since receiving transfusion several weeks ago.  He otherwise feels well.  He denies any fevers, night sweats, or weight loss. He has noted no new lymphadenopathy.  He has no neurologic complaints today.  He denies any chest pain, shortness of breath, cough, or hemoptysis.  He denies any nausea, vomiting, constipation, or diarrhea. He has no urinary complaints.  Patient offers no further specific complaints today.  REVIEW OF SYSTEMS:   Review of Systems  Constitutional:  Positive for malaise/fatigue. Negative for diaphoresis, fever and weight loss.  Respiratory: Negative.  Negative for cough and shortness of breath.   Cardiovascular: Negative.  Negative for chest pain and leg swelling.  Gastrointestinal: Negative.  Negative for abdominal pain, blood in stool and melena.  Genitourinary: Negative.  Negative for dysuria.  Musculoskeletal: Negative.  Negative for back pain.  Skin: Negative.  Negative for rash.  Neurological:  Positive for weakness. Negative for dizziness, sensory change, focal weakness and headaches.  Psychiatric/Behavioral: Negative.  The patient is not nervous/anxious.     As per HPI. Otherwise, a complete review of systems is negative.  PAST MEDICAL  HISTORY: Past Medical History:  Diagnosis Date   Anxiety    CKD (chronic kidney disease), stage III (HCC)    CLL (chronic lymphocytic leukemia) (HCC)    Diabetes mellitus without complication (HCC)    Diverticulitis 04/09/2017   History of kidney stones    HLD (hyperlipidemia)    HTN (hypertension)    PKD (polycystic kidney disease)    Sleep apnea    Uncontrolled type 2 diabetes mellitus with hyperglycemia (HCC) 06/28/2018    PAST SURGICAL HISTORY: Past Surgical History:  Procedure Laterality Date   CARDIAC CATHETERIZATION  12/1987   CENTRAL LINE INSERTION N/A 04/11/2017   Procedure: Central Line Insertion;  Surgeon: Renford Dills, MD;  Location: [facility];  Service: Cardiovascular;  Laterality: N/A;   CERVICAL FUSION     CHOLECYSTECTOMY     CYSTOSCOPY W/ RETROGRADES Left 05/12/2017   Procedure: CYSTOSCOPY WITH RETROGRADE PYELOGRAM;  Surgeon: Hildred Laser, MD;  Location: [facility];  Service: Urology;  Laterality: Left;   CYSTOSCOPY W/ URETERAL STENT PLACEMENT Left 05/12/2017   Procedure: CYSTOSCOPY WITH STENT REMOVAL;  Surgeon: Hildred Laser, MD;  Location: [facility];  Service: Urology;  Laterality: Left;   CYSTOSCOPY WITH STENT PLACEMENT Left 04/11/2017   Procedure: CYSTOSCOPY WITH STENT PLACEMENT;  Surgeon: Jerilee Field, MD;  Location: [facility];  Service: Urology;  Laterality: Left;   URETEROSCOPY  05/12/2017   Procedure: URETEROSCOPY;  Surgeon: Hildred Laser, MD;  Location: [facility];  Service: Urology;;    FAMILY HISTORY: Reported history of ovarian and lung cancer. Diabetes, hypertension.     ADVANCED DIRECTIVES:    HEALTH MAINTENANCE: Social History   Tobacco Use   Smoking status: Former    Passive exposure:  Past   Smokeless tobacco: Never   Tobacco comments:    quit in Feb of 1997  Substance Use Topics   Alcohol use: No   Drug use: No     Colonoscopy:  PAP:  Bone density:  Lipid panel:  Allergies  Allergen Reactions    Celecoxib Other (See Comments)    Increased Blood Pressure   Chlorpromazine Nausea Only   Iodinated Contrast Media Other (See Comments)    Stage 3 kidney disease, told IV dye would be bad for him   Prednisone Other (See Comments)    Diabetic   Amoxicillin-Pot Clavulanate Rash    Has patient had a PCN reaction causing immediate rash, facial/tongue/throat swelling, SOB or lightheadedness with hypotension: No Has patient had a PCN reaction causing severe rash involving mucus membranes or skin necrosis: No Has patient had a PCN reaction that required hospitalization: No Has patient had a PCN reaction occurring within the last 10 years: Yes If all of the above answers are "NO", then may proceed with Cephalosporin use.    Penicillin V Potassium Rash    Current Outpatient Medications  Medication Sig Dispense Refill   Aflibercept 2 MG/0.05ML SOLN Inject 1 Dose into the eye as directed. One injection into left eye every 8-9 weeks     allopurinol (ZYLOPRIM) 100 MG tablet Take 100 mg by mouth daily.     atorvastatin (LIPITOR) 10 MG tablet Take 10 mg by mouth at bedtime.      BD PEN NEEDLE NANO 2ND GEN 32G X 4 MM MISC USE AS DIRECTED ONCE A DAY     carvedilol (COREG) 12.5 MG tablet Take 12.5 mg by mouth 2 (two) times daily with a meal.     Cholecalciferol (VITAMIN D-3) 5000 units TABS Take 5,000 Units by mouth at bedtime.      clonazePAM (KLONOPIN) 1 MG tablet Take 1 mg by mouth at bedtime.      CONTOUR NEXT TEST test strip daily.     cyanocobalamin 500 MCG tablet Take 1,000 mcg by mouth at bedtime.      empagliflozin (JARDIANCE) 10 MG TABS tablet 10 mg daily.     epoetin alfa-epbx (RETACRIT) 47829 UNIT/ML injection Inject 40,000 Units into the skin every 6 (six) weeks. Depending on lab results (if needed)     fexofenadine (ALLEGRA) 180 MG tablet Take 180 mg by mouth daily as needed for allergies.     finasteride (PROSCAR) 5 MG tablet Take 1 tablet (5 mg total) by mouth daily. 90 tablet 3   folic  acid (FOLVITE) 400 MCG tablet Take 400 mcg by mouth daily.     insulin glargine (LANTUS) 100 UNIT/ML injection Inject 20-30 Units into the skin every morning. Takes Lantus 20 units if CBG less than 120 mg/dl or Lantus 30 units if CBG is over 120 mg/dl     LORazepam (ATIVAN) 1 MG tablet Take 0.5 mg by mouth every 8 (eight) hours as needed for anxiety (takes 1 tablet every morning.  Rarely has to take again during the day).     Multiple Vitamins-Minerals (PRESERVISION AREDS 2 PO) Take 1 capsule by mouth 2 (two) times daily.      omega-3 acid ethyl esters (LOVAZA) 1 g capsule Take by mouth 2 (two) times daily.     prednisoLONE acetate (PRED FORTE) 1 % ophthalmic suspension Place 1 drop into the right eye 4 (four) times daily.     sertraline (ZOLOFT) 50 MG tablet Take 100 mg by mouth daily.  tamsulosin (FLOMAX) 0.4 MG CAPS capsule Take 1 capsule (0.4 mg total) by mouth daily after supper. 90 capsule 3   telmisartan (MICARDIS) 40 MG tablet Take 40 mg by mouth daily.     triamcinolone (NASACORT) 55 MCG/ACT AERO nasal inhaler Place 2 sprays into the nose 2 (two) times daily as needed (allergies).      No current facility-administered medications for this visit.   Facility-Administered Medications Ordered in Other Visits  Medication Dose Route Frequency Provider Last Rate Last Admin   epoetin alfa-epbx (RETACRIT) injection 40,000 Units  40,000 Units Subcutaneous Once Jeralyn Ruths, MD        OBJECTIVE: Vitals:   07/05/23 0933  BP: (!) 147/65  Pulse: (!) 51  Resp: 16  Temp: (!) 97.3 F (36.3 C)  SpO2: 100%      Body mass index is 28.41 kg/m.    ECOG FS:0 - Asymptomatic  General: Well-developed, well-nourished, no acute distress. Eyes: Pink conjunctiva, anicteric sclera. HEENT: Normocephalic, moist mucous membranes. Lungs: No audible wheezing or coughing. Heart: Regular rate and rhythm. Abdomen: Soft, nontender, no obvious distention. Musculoskeletal: No edema, cyanosis, or  clubbing. Neuro: Alert, answering all questions appropriately. Cranial nerves grossly intact. Skin: No rashes or petechiae noted. Psych: Normal affect.  LAB RESULTS:  Lab Results  Component Value Date   NA 136 12/19/2022   K 3.7 12/19/2022   CL 106 12/19/2022   CO2 24 12/19/2022   GLUCOSE 80 12/19/2022   BUN 26 (H) 12/19/2022   CREATININE 2.04 (H) 12/19/2022   CALCIUM 7.2 (L) 12/19/2022   PROT 4.6 (L) 12/15/2022   ALBUMIN 2.8 (L) 12/15/2022   AST 15 12/15/2022   ALT 11 12/15/2022   ALKPHOS 91 12/15/2022   BILITOT 0.5 12/15/2022   GFRNONAA 31 (L) 12/19/2022   GFRAA 37 (L) 11/19/2019    Lab Results  Component Value Date   WBC 9.7 07/05/2023   NEUTROABS 2.1 07/05/2023   HGB 8.2 (L) 07/05/2023   HCT 26.8 (L) 07/05/2023   MCV 107.2 (H) 07/05/2023   PLT 26 (L) 07/05/2023   Lab Results  Component Value Date   IRON 67 12/15/2022   TIBC 251 12/15/2022   IRONPCTSAT 27 12/15/2022   Lab Results  Component Value Date   FERRITIN 31 08/25/2021     STUDIES: No results found.  ASSESSMENT: Rai stage I CLL, anemia secondary to chronic renal insufficiency.  PLAN:    Anemia secondary to chronic renal insufficiency: Patient's hemoglobin improved to 8.2 with transfusion several weeks ago.  He continues to be symptomatic, therefore return to clinic tomorrow for 1 additional unit of packed red blood cells.  Previously all of his other laboratory work including iron panel are either negative or within normal limits.  Return to clinic in 2 weeks as previously scheduled for repeat laboratory work, further evaluation, and consideration of Retacrit.   CLL: Chronic and unchanged.  Patient's total white blood cell count has ranged between 9.4 and 24.8 since May 2013.  Today's result is 9.7.  No intervention is needed.  Patient does not require bone marrow biopsy or repeat imaging.  Continue simple observation. Thrombocytopenia: Patient's platelet count is slightly below his baseline at 26  today.  His count was as high as 95 in June 2016.  But more recently between has ranged from 32-63 since June 2019.  Hesitant to use steroids given his underlying diabetes and is unclear if Rituxan will be effective.  Have recommended platelet transfusion the day before and  the day of his aortic aneurysm repair.  Patient will also require transfusion prior to his dental procedure. Lymphadenopathy: CT scan results from April 09, 2017 reviewed independently with enlarged pelvic and retroperitoneal lymph nodes. These were also noted on a scan in 2012 appear to be slightly larger. No intervention is needed. No further imaging is necessary unless there is suspicion of progression of disease.  Aortic aneurysm: Appreciate vascular surgery input.  Patient elected to hold off any procedures until his aneurysm reaches 5.5 cm. Dental surgery: Patient will call clinic later this afternoon as to when his procedure is going to be scheduled at which point he can be scheduled for platelet transfusion.  Patient expressed understanding and was in agreement with this plan. He also understands that He can call clinic at any time with any questions, concerns, or complaints.   Jeralyn Ruths, MD   07/05/2023 12:11 PM

## 2023-07-06 ENCOUNTER — Other Ambulatory Visit: Payer: Self-pay | Admitting: Oncology

## 2023-07-06 ENCOUNTER — Inpatient Hospital Stay: Payer: Medicare Other

## 2023-07-06 ENCOUNTER — Other Ambulatory Visit: Payer: Self-pay | Admitting: *Deleted

## 2023-07-06 DIAGNOSIS — N1832 Chronic kidney disease, stage 3b: Secondary | ICD-10-CM | POA: Diagnosis not present

## 2023-07-06 DIAGNOSIS — D696 Thrombocytopenia, unspecified: Secondary | ICD-10-CM

## 2023-07-06 DIAGNOSIS — N189 Chronic kidney disease, unspecified: Secondary | ICD-10-CM

## 2023-07-06 LAB — TYPE AND SCREEN
ABO/RH(D): A POS
Antibody Screen: NEGATIVE
Unit division: 0

## 2023-07-06 LAB — BPAM PLATELET PHERESIS
Blood Product Expiration Date: 202409282359
Unit Type and Rh: 5100

## 2023-07-06 LAB — PREPARE PLATELET PHERESIS: Unit division: 0

## 2023-07-06 LAB — BPAM RBC
Blood Product Expiration Date: 202410252359
Unit Type and Rh: 6200
Unit Type and Rh: 6200

## 2023-07-06 MED ORDER — SODIUM CHLORIDE 0.9% IV SOLUTION
250.0000 mL | Freq: Once | INTRAVENOUS | Status: AC
  Administered 2023-07-06: 250 mL via INTRAVENOUS
  Filled 2023-07-06: qty 250

## 2023-07-06 MED ORDER — DIPHENHYDRAMINE HCL 50 MG/ML IJ SOLN
25.0000 mg | Freq: Once | INTRAMUSCULAR | Status: AC
  Administered 2023-07-06: 25 mg via INTRAVENOUS
  Filled 2023-07-06: qty 1

## 2023-07-06 MED ORDER — ACETAMINOPHEN 325 MG PO TABS
650.0000 mg | ORAL_TABLET | Freq: Once | ORAL | Status: AC
  Administered 2023-07-06: 650 mg via ORAL
  Filled 2023-07-06: qty 2

## 2023-07-06 MED ORDER — DIPHENHYDRAMINE HCL 50 MG/ML IJ SOLN: 25.0000 mg | Freq: Once | INTRAMUSCULAR | Status: DC

## 2023-07-06 MED ORDER — ACETAMINOPHEN 325 MG PO TABS: 650.0000 mg | ORAL_TABLET | Freq: Once | ORAL | Status: DC

## 2023-07-07 ENCOUNTER — Other Ambulatory Visit: Payer: Self-pay | Admitting: *Deleted

## 2023-07-07 ENCOUNTER — Encounter: Payer: Self-pay | Admitting: Oncology

## 2023-07-07 DIAGNOSIS — D696 Thrombocytopenia, unspecified: Secondary | ICD-10-CM

## 2023-07-10 ENCOUNTER — Inpatient Hospital Stay: Payer: Medicare Other

## 2023-07-10 ENCOUNTER — Ambulatory Visit: Payer: Medicare Other

## 2023-07-10 ENCOUNTER — Other Ambulatory Visit: Payer: Medicare Other

## 2023-07-10 DIAGNOSIS — N1832 Chronic kidney disease, stage 3b: Secondary | ICD-10-CM | POA: Diagnosis not present

## 2023-07-10 DIAGNOSIS — D696 Thrombocytopenia, unspecified: Secondary | ICD-10-CM

## 2023-07-10 LAB — CBC WITH DIFFERENTIAL/PLATELET
Abs Immature Granulocytes: 0.02 10*3/uL (ref 0.00–0.07)
Basophils Absolute: 0 10*3/uL (ref 0.0–0.1)
Basophils Relative: 0 %
Eosinophils Absolute: 0.1 10*3/uL (ref 0.0–0.5)
Eosinophils Relative: 1 %
HCT: 25.7 % — ABNORMAL LOW (ref 39.0–52.0)
Hemoglobin: 8.1 g/dL — ABNORMAL LOW (ref 13.0–17.0)
Immature Granulocytes: 0 %
Lymphocytes Relative: 70 %
Lymphs Abs: 5.7 10*3/uL — ABNORMAL HIGH (ref 0.7–4.0)
MCH: 32.5 pg (ref 26.0–34.0)
MCHC: 31.5 g/dL (ref 30.0–36.0)
MCV: 103.2 fL — ABNORMAL HIGH (ref 80.0–100.0)
Monocytes Absolute: 0.2 10*3/uL (ref 0.1–1.0)
Monocytes Relative: 3 %
Neutro Abs: 2 10*3/uL (ref 1.7–7.7)
Neutrophils Relative %: 26 %
Platelets: 40 10*3/uL — ABNORMAL LOW (ref 150–400)
RBC: 2.49 MIL/uL — ABNORMAL LOW (ref 4.22–5.81)
RDW: 17.5 % — ABNORMAL HIGH (ref 11.5–15.5)
Smear Review: NORMAL
WBC: 8.1 10*3/uL (ref 4.0–10.5)
nRBC: 0 % (ref 0.0–0.2)

## 2023-07-10 MED ORDER — ACETAMINOPHEN 325 MG PO TABS
650.0000 mg | ORAL_TABLET | Freq: Once | ORAL | Status: AC
  Administered 2023-07-10: 650 mg via ORAL
  Filled 2023-07-10: qty 2

## 2023-07-10 MED ORDER — DIPHENHYDRAMINE HCL 25 MG PO CAPS
25.0000 mg | ORAL_CAPSULE | Freq: Once | ORAL | Status: AC
  Administered 2023-07-10: 25 mg via ORAL
  Filled 2023-07-10: qty 1

## 2023-07-10 MED ORDER — SODIUM CHLORIDE 0.9% IV SOLUTION
250.0000 mL | Freq: Once | INTRAVENOUS | Status: AC
  Administered 2023-07-10: 250 mL via INTRAVENOUS
  Filled 2023-07-10: qty 250

## 2023-07-10 NOTE — Patient Instructions (Signed)

## 2023-07-11 LAB — PREPARE PLATELET PHERESIS
Unit division: 0
Unit division: 0

## 2023-07-11 LAB — BPAM PLATELET PHERESIS
Blood Product Expiration Date: 202409282359
Blood Product Expiration Date: 202409302359
Blood Product Expiration Date: 202410032359
ISSUE DATE / TIME: 202409300846
ISSUE DATE / TIME: 202409300959
Unit Type and Rh: 5100
Unit Type and Rh: 6200

## 2023-07-13 ENCOUNTER — Ambulatory Visit: Payer: Medicare Other | Admitting: Urology

## 2023-07-17 ENCOUNTER — Other Ambulatory Visit: Payer: Self-pay | Admitting: *Deleted

## 2023-07-17 DIAGNOSIS — D696 Thrombocytopenia, unspecified: Secondary | ICD-10-CM

## 2023-07-17 DIAGNOSIS — D631 Anemia in chronic kidney disease: Secondary | ICD-10-CM

## 2023-07-18 ENCOUNTER — Inpatient Hospital Stay (HOSPITAL_BASED_OUTPATIENT_CLINIC_OR_DEPARTMENT_OTHER): Payer: Medicare Other | Admitting: Oncology

## 2023-07-18 ENCOUNTER — Inpatient Hospital Stay: Payer: Medicare Other

## 2023-07-18 ENCOUNTER — Encounter: Payer: Self-pay | Admitting: Oncology

## 2023-07-18 ENCOUNTER — Inpatient Hospital Stay: Payer: Medicare Other | Attending: Oncology

## 2023-07-18 VITALS — BP 141/70 | HR 58 | Temp 97.7°F | Resp 16 | Ht 66.0 in | Wt 173.7 lb

## 2023-07-18 DIAGNOSIS — D631 Anemia in chronic kidney disease: Secondary | ICD-10-CM | POA: Diagnosis present

## 2023-07-18 DIAGNOSIS — Z23 Encounter for immunization: Secondary | ICD-10-CM | POA: Diagnosis not present

## 2023-07-18 DIAGNOSIS — N189 Chronic kidney disease, unspecified: Secondary | ICD-10-CM | POA: Diagnosis not present

## 2023-07-18 DIAGNOSIS — C9110 Chronic lymphocytic leukemia of B-cell type not having achieved remission: Secondary | ICD-10-CM | POA: Insufficient documentation

## 2023-07-18 DIAGNOSIS — D696 Thrombocytopenia, unspecified: Secondary | ICD-10-CM

## 2023-07-18 DIAGNOSIS — N1832 Chronic kidney disease, stage 3b: Secondary | ICD-10-CM | POA: Insufficient documentation

## 2023-07-18 LAB — CBC WITH DIFFERENTIAL/PLATELET
Abs Immature Granulocytes: 0.04 10*3/uL (ref 0.00–0.07)
Basophils Absolute: 0 10*3/uL (ref 0.0–0.1)
Basophils Relative: 0 %
Eosinophils Absolute: 0.1 10*3/uL (ref 0.0–0.5)
Eosinophils Relative: 1 %
HCT: 28.1 % — ABNORMAL LOW (ref 39.0–52.0)
Hemoglobin: 8.8 g/dL — ABNORMAL LOW (ref 13.0–17.0)
Immature Granulocytes: 0 %
Lymphocytes Relative: 75 %
Lymphs Abs: 11.3 10*3/uL — ABNORMAL HIGH (ref 0.7–4.0)
MCH: 32 pg (ref 26.0–34.0)
MCHC: 31.3 g/dL (ref 30.0–36.0)
MCV: 102.2 fL — ABNORMAL HIGH (ref 80.0–100.0)
Monocytes Absolute: 0.3 10*3/uL (ref 0.1–1.0)
Monocytes Relative: 2 %
Neutro Abs: 3.2 10*3/uL (ref 1.7–7.7)
Neutrophils Relative %: 22 %
Platelets: 38 10*3/uL — ABNORMAL LOW (ref 150–400)
RBC: 2.75 MIL/uL — ABNORMAL LOW (ref 4.22–5.81)
RDW: 17 % — ABNORMAL HIGH (ref 11.5–15.5)
Smear Review: NORMAL
WBC: 15 10*3/uL — ABNORMAL HIGH (ref 4.0–10.5)
nRBC: 0 % (ref 0.0–0.2)

## 2023-07-18 LAB — SAMPLE TO BLOOD BANK

## 2023-07-18 MED ORDER — EPOETIN ALFA-EPBX 40000 UNIT/ML IJ SOLN
40000.0000 [IU] | Freq: Once | INTRAMUSCULAR | Status: AC
  Administered 2023-07-18: 40000 [IU] via SUBCUTANEOUS
  Filled 2023-07-18: qty 1

## 2023-07-18 MED ORDER — INFLUENZA VAC A&B SURF ANT ADJ 0.5 ML IM SUSY
0.5000 mL | PREFILLED_SYRINGE | Freq: Once | INTRAMUSCULAR | Status: AC
  Administered 2023-07-18: 0.5 mL via INTRAMUSCULAR

## 2023-07-18 MED ORDER — INFLUENZA VAC A&B SURF ANT ADJ 0.5 ML IM SUSY: 0.5000 mL | PREFILLED_SYRINGE | Freq: Once | INTRAMUSCULAR | Status: DC

## 2023-07-18 NOTE — Progress Notes (Signed)
[facility]  Telephone:([phone] [phone]  ID: Patrick Catholic Orellana Jr. OB: Nov 01, 1933  MR#: [id number]  XLK#:[id number]  Patient Care Team: Marguarite Arbour, MD as PCP - General (Internal Medicine) Jeralyn Ruths, MD as Consulting Physician (Oncology)  CHIEF COMPLAINT: CLL, anemia secondary to chronic renal insufficiency, thrombocytopenia.  INTERVAL HISTORY: Patient returns to clinic today for repeat laboratory work, further evaluation, and continuation of Retacrit.  He underwent his dental surgery recently and tolerated the procedure well without excessive bleeding.  He currently feels well and is nearly back to his baseline.  He continues to have chronic weakness and fatigue, but this is improving.  He denies any fevers, night sweats, or weight loss. He has noted no new lymphadenopathy.  He has no neurologic complaints today.  He denies any chest pain, shortness of breath, cough, or hemoptysis.  He denies any nausea, vomiting, constipation, or diarrhea. He has no urinary complaints.  Patient offers no further specific complaints today.  REVIEW OF SYSTEMS:   Review of Systems  Constitutional:  Positive for malaise/fatigue. Negative for diaphoresis, fever and weight loss.  Respiratory: Negative.  Negative for cough and shortness of breath.   Cardiovascular: Negative.  Negative for chest pain and leg swelling.  Gastrointestinal: Negative.  Negative for abdominal pain, blood in stool and melena.  Genitourinary: Negative.  Negative for dysuria.  Musculoskeletal: Negative.  Negative for back pain.  Skin: Negative.  Negative for rash.  Neurological:  Positive for weakness. Negative for dizziness, sensory change, focal weakness and headaches.  Psychiatric/Behavioral: Negative.  The patient is not nervous/anxious.     As per HPI. Otherwise, a complete review of systems is negative.  PAST MEDICAL HISTORY: Past Medical History:  Diagnosis Date   Anxiety     CKD (chronic kidney disease), stage III (HCC)    CLL (chronic lymphocytic leukemia) (HCC)    Diabetes mellitus without complication (HCC)    Diverticulitis 04/09/2017   History of kidney stones    HLD (hyperlipidemia)    HTN (hypertension)    PKD (polycystic kidney disease)    Sleep apnea    Uncontrolled type 2 diabetes mellitus with hyperglycemia (HCC) 06/28/2018    PAST SURGICAL HISTORY: Past Surgical History:  Procedure Laterality Date   CARDIAC CATHETERIZATION  12/1987   CENTRAL LINE INSERTION N/A 04/11/2017   Procedure: Central Line Insertion;  Surgeon: Renford Dills, MD;  Location: [facility];  Service: Cardiovascular;  Laterality: N/A;   CERVICAL FUSION     CHOLECYSTECTOMY     CYSTOSCOPY W/ RETROGRADES Left 05/12/2017   Procedure: CYSTOSCOPY WITH RETROGRADE PYELOGRAM;  Surgeon: Hildred Laser, MD;  Location: [facility];  Service: Urology;  Laterality: Left;   CYSTOSCOPY W/ URETERAL STENT PLACEMENT Left 05/12/2017   Procedure: CYSTOSCOPY WITH STENT REMOVAL;  Surgeon: Hildred Laser, MD;  Location: [facility];  Service: Urology;  Laterality: Left;   CYSTOSCOPY WITH STENT PLACEMENT Left 04/11/2017   Procedure: CYSTOSCOPY WITH STENT PLACEMENT;  Surgeon: Jerilee Field, MD;  Location: [facility];  Service: Urology;  Laterality: Left;   URETEROSCOPY  05/12/2017   Procedure: URETEROSCOPY;  Surgeon: Hildred Laser, MD;  Location: [facility];  Service: Urology;;    FAMILY HISTORY: Reported history of ovarian and lung cancer. Diabetes, hypertension.     ADVANCED DIRECTIVES:    HEALTH MAINTENANCE: Social History   Tobacco Use   Smoking status: Former    Passive exposure: Past   Smokeless tobacco: Never   Tobacco comments:  quit in Feb of 1997  Substance Use Topics   Alcohol use: No   Drug use: No     Colonoscopy:  PAP:  Bone density:  Lipid panel:  Allergies  Allergen Reactions   Celecoxib Other (See Comments)    Increased Blood Pressure    Chlorpromazine Nausea Only   Iodinated Contrast Media Other (See Comments)    Stage 3 kidney disease, told IV dye would be bad for him   Prednisone Other (See Comments)    Diabetic   Amoxicillin-Pot Clavulanate Rash    Has patient had a PCN reaction causing immediate rash, facial/tongue/throat swelling, SOB or lightheadedness with hypotension: No Has patient had a PCN reaction causing severe rash involving mucus membranes or skin necrosis: No Has patient had a PCN reaction that required hospitalization: No Has patient had a PCN reaction occurring within the last 10 years: Yes If all of the above answers are "NO", then may proceed with Cephalosporin use.    Penicillin V Potassium Rash    Current Outpatient Medications  Medication Sig Dispense Refill   Aflibercept 2 MG/0.05ML SOLN Inject 1 Dose into the eye as directed. One injection into left eye every 8-9 weeks     allopurinol (ZYLOPRIM) 100 MG tablet Take 100 mg by mouth daily.     atorvastatin (LIPITOR) 10 MG tablet Take 10 mg by mouth at bedtime.      BD PEN NEEDLE NANO 2ND GEN 32G X 4 MM MISC USE AS DIRECTED ONCE A DAY     carvedilol (COREG) 12.5 MG tablet Take 12.5 mg by mouth 2 (two) times daily with a meal.     Cholecalciferol (VITAMIN D-3) 5000 units TABS Take 5,000 Units by mouth at bedtime.      clonazePAM (KLONOPIN) 1 MG tablet Take 1 mg by mouth at bedtime.      CONTOUR NEXT TEST test strip daily.     cyanocobalamin 500 MCG tablet Take 1,000 mcg by mouth at bedtime.      empagliflozin (JARDIANCE) 10 MG TABS tablet 10 mg daily.     epoetin alfa-epbx (RETACRIT) 72536 UNIT/ML injection Inject 40,000 Units into the skin every 6 (six) weeks. Depending on lab results (if needed)     fexofenadine (ALLEGRA) 180 MG tablet Take 180 mg by mouth daily as needed for allergies.     finasteride (PROSCAR) 5 MG tablet Take 1 tablet (5 mg total) by mouth daily. 90 tablet 3   folic acid (FOLVITE) 400 MCG tablet Take 400 mcg by mouth daily.      insulin glargine (LANTUS) 100 UNIT/ML injection Inject 20-30 Units into the skin every morning. Takes Lantus 20 units if CBG less than 120 mg/dl or Lantus 30 units if CBG is over 120 mg/dl     Multiple Vitamins-Minerals (PRESERVISION AREDS 2 PO) Take 1 capsule by mouth 2 (two) times daily.      omega-3 acid ethyl esters (LOVAZA) 1 g capsule Take by mouth 2 (two) times daily.     prednisoLONE acetate (PRED FORTE) 1 % ophthalmic suspension Place 1 drop into the right eye 4 (four) times daily.     sertraline (ZOLOFT) 50 MG tablet Take 100 mg by mouth daily.     tamsulosin (FLOMAX) 0.4 MG CAPS capsule Take 1 capsule (0.4 mg total) by mouth daily after supper. 90 capsule 3   telmisartan (MICARDIS) 40 MG tablet Take 40 mg by mouth daily.     triamcinolone (NASACORT) 55 MCG/ACT AERO nasal inhaler Place 2  sprays into the nose 2 (two) times daily as needed (allergies).      Current Facility-Administered Medications  Medication Dose Route Frequency Provider Last Rate Last Admin   influenza vaccine adjuvanted (FLUAD) injection 0.5 mL  0.5 mL Intramuscular Once Jeralyn Ruths, MD       Facility-Administered Medications Ordered in Other Visits  Medication Dose Route Frequency Provider Last Rate Last Admin   epoetin alfa-epbx (RETACRIT) injection 40,000 Units  40,000 Units Subcutaneous Once Jeralyn Ruths, MD       epoetin alfa-epbx (RETACRIT) injection 40,000 Units  40,000 Units Subcutaneous Once Jeralyn Ruths, MD        OBJECTIVE: Vitals:   07/18/23 1415  BP: (!) 141/70  Pulse: (!) 58  Resp: 16  Temp: 97.7 F (36.5 C)  SpO2: 100%      Body mass index is 28.04 kg/m.    ECOG FS:0 - Asymptomatic  General: Well-developed, well-nourished, no acute distress. Eyes: Pink conjunctiva, anicteric sclera. HEENT: Normocephalic, moist mucous membranes. Lungs: No audible wheezing or coughing. Heart: Regular rate and rhythm. Abdomen: Soft, nontender, no obvious  distention. Musculoskeletal: No edema, cyanosis, or clubbing. Neuro: Alert, answering all questions appropriately. Cranial nerves grossly intact. Skin: No rashes or petechiae noted. Psych: Normal affect.  LAB RESULTS:  Lab Results  Component Value Date   NA 136 12/19/2022   K 3.7 12/19/2022   CL 106 12/19/2022   CO2 24 12/19/2022   GLUCOSE 80 12/19/2022   BUN 26 (H) 12/19/2022   CREATININE 2.04 (H) 12/19/2022   CALCIUM 7.2 (L) 12/19/2022   PROT 4.6 (L) 12/15/2022   ALBUMIN 2.8 (L) 12/15/2022   AST 15 12/15/2022   ALT 11 12/15/2022   ALKPHOS 91 12/15/2022   BILITOT 0.5 12/15/2022   GFRNONAA 31 (L) 12/19/2022   GFRAA 37 (L) 11/19/2019    Lab Results  Component Value Date   WBC 15.0 (H) 07/18/2023   NEUTROABS 3.2 07/18/2023   HGB 8.8 (L) 07/18/2023   HCT 28.1 (L) 07/18/2023   MCV 102.2 (H) 07/18/2023   PLT 38 (L) 07/18/2023   Lab Results  Component Value Date   IRON 67 12/15/2022   TIBC 251 12/15/2022   IRONPCTSAT 27 12/15/2022   Lab Results  Component Value Date   FERRITIN 31 08/25/2021     STUDIES: No results found.  ASSESSMENT: Rai stage I CLL, anemia secondary to chronic renal insufficiency.  PLAN:    Anemia secondary to chronic renal insufficiency: Patient's hemoglobin has improved to 8.8.  Proceed with 40,000 units Retacrit today.  Patient return to clinic in 1 and 2 months for laboratory work and Retacrit only if his hemoglobin remains below 10.0.  He will then return to clinic in 3 months with repeat laboratory work, further evaluation, and continuation of treatment if needed.  CLL: Chronic and unchanged.  Patient's total white blood cell count has ranged between 9.4 and 24.8 since May 2013.  Today's result was 15.0.  No intervention is needed.  Patient does not require bone marrow biopsy or repeat imaging.  Continue simple observation. Thrombocytopenia: Patient required multiple transfusions of platelets prior to his oral surgery.  He did not have any  excessive bleeding during his surgery.  Today's platelet count has improved to 38 which is approximately his baseline. His count was as high as 95 in June 2016.  But more recently between has ranged from 32-63 since June 2019.  Hesitant to use steroids given his underlying diabetes and is unclear  if Rituxan will be effective.  He does not require transfusion today.  Return to clinic as above.   Lymphadenopathy: CT scan results from April 09, 2017 reviewed independently with enlarged pelvic and retroperitoneal lymph nodes. These were also noted on a scan in 2012 appear to be slightly larger. No intervention is needed. No further imaging is necessary unless there is suspicion of progression of disease.  Aortic aneurysm: Appreciate vascular surgery input.  Patient elected to hold off any procedures until his aneurysm reaches 5.5 cm. Dental surgery: Successful and resolved.  I spent a total of 30 minutes reviewing chart data, face-to-face evaluation with the patient, counseling and coordination of care as detailed above.  Patient expressed understanding and was in agreement with this plan. He also understands that He can call clinic at any time with any questions, concerns, or complaints.   Jeralyn Ruths, MD   07/18/2023 3:04 PM

## 2023-08-07 ENCOUNTER — Other Ambulatory Visit: Payer: Self-pay | Admitting: Urology

## 2023-08-07 DIAGNOSIS — N401 Enlarged prostate with lower urinary tract symptoms: Secondary | ICD-10-CM

## 2023-08-08 ENCOUNTER — Other Ambulatory Visit: Payer: Self-pay | Admitting: Urology

## 2023-08-08 DIAGNOSIS — N401 Enlarged prostate with lower urinary tract symptoms: Secondary | ICD-10-CM

## 2023-08-17 ENCOUNTER — Ambulatory Visit: Payer: Medicare Other | Admitting: Urology

## 2023-08-17 ENCOUNTER — Other Ambulatory Visit: Payer: Self-pay | Admitting: *Deleted

## 2023-08-17 VITALS — BP 159/75 | HR 60 | Ht 66.0 in | Wt 176.0 lb

## 2023-08-17 DIAGNOSIS — N401 Enlarged prostate with lower urinary tract symptoms: Secondary | ICD-10-CM

## 2023-08-17 DIAGNOSIS — Q600 Renal agenesis, unilateral: Secondary | ICD-10-CM

## 2023-08-17 DIAGNOSIS — D631 Anemia in chronic kidney disease: Secondary | ICD-10-CM

## 2023-08-17 DIAGNOSIS — D696 Thrombocytopenia, unspecified: Secondary | ICD-10-CM

## 2023-08-17 DIAGNOSIS — C9110 Chronic lymphocytic leukemia of B-cell type not having achieved remission: Secondary | ICD-10-CM

## 2023-08-17 LAB — BLADDER SCAN AMB NON-IMAGING: PVR: 0 WU

## 2023-08-17 MED ORDER — FINASTERIDE 5 MG PO TABS: 5.0000 mg | ORAL_TABLET | Freq: Every day | ORAL | 3 refills | Status: DC

## 2023-08-17 NOTE — Progress Notes (Signed)
     08/17/2023 11:34 AM   Patrick Catholic Rodin Jr. 1934-01-12 [id number]  Reason for visit: History of microscopic hematuria, polycystic right kidney disease, solitary kidney, BPH, CKD   HPI: 89 year old male with interesting urologic history.He had acute onset of renal failure in July 2018 and was found to have left hydronephrosis and a functionally solitary left kidney, with severe polycystic disease of the right kidney.  He underwent ureteral stent placement which improved his renal function, and ultimately underwent negative ureteroscopy, stent was removed, and hydronephrosis resolved.  He had a cystoscopy with an outside urologist a few days prior to developing the original hydronephrosis, and he feels he developed an infection from this causing the hydronephrosis originally.  A follow-up ultrasound 06/09/2017 showed resolution of his hydronephrosis after stent removal.   He has a history of intermittent right-sided flank pain and gross hematuria suspected to be from rupture of right renal cyst in the setting of his polycystic right kidney.  He has deferred further cystoscopy in the past with his history of complications from cystoscopy with Dr. Sheppard Penton.   Repeat renal ultrasounds have been reassuring.  I personally viewed and interpreted the most recent CT abdomen and pelvis without contrast from August 2024 that shows stable dysplastic cystic right kidney, no left hydronephrosis, normal-appearing decompressed bladder.  Renal function has been stable, most recent creatinine from October 2024 2.01, EGFR 32.  Urinalysis benign.   He also was hospitalized in March 2024 for pneumonia, found to have mild left hydronephrosis on ultrasound that resolved spontaneously, was emptying well at that time.  He has BPH and has previously been on maximal medical therapy with Flomax and finasteride.  He denies any change in urinary symptoms and continues to void well.  He denies any incontinence.  Has nocturia  0-1 time overnight.  RTC 1 year PVR Continue finasteride and Flomax, refilled  Sondra Come, MD  [facility] [address] [place], [place] [address] [phone]

## 2023-08-18 ENCOUNTER — Inpatient Hospital Stay: Payer: Medicare Other

## 2023-08-18 ENCOUNTER — Other Ambulatory Visit: Payer: Self-pay | Admitting: *Deleted

## 2023-08-18 ENCOUNTER — Other Ambulatory Visit: Payer: Self-pay | Admitting: Oncology

## 2023-08-18 ENCOUNTER — Other Ambulatory Visit: Payer: Self-pay

## 2023-08-18 ENCOUNTER — Inpatient Hospital Stay: Payer: Medicare Other | Attending: Oncology

## 2023-08-18 VITALS — BP 134/60

## 2023-08-18 DIAGNOSIS — D631 Anemia in chronic kidney disease: Secondary | ICD-10-CM

## 2023-08-18 DIAGNOSIS — D696 Thrombocytopenia, unspecified: Secondary | ICD-10-CM

## 2023-08-18 DIAGNOSIS — N1832 Chronic kidney disease, stage 3b: Secondary | ICD-10-CM | POA: Insufficient documentation

## 2023-08-18 DIAGNOSIS — C9110 Chronic lymphocytic leukemia of B-cell type not having achieved remission: Secondary | ICD-10-CM | POA: Insufficient documentation

## 2023-08-18 LAB — PREPARE RBC (CROSSMATCH)

## 2023-08-18 LAB — CBC WITH DIFFERENTIAL/PLATELET
Abs Immature Granulocytes: 0.01 10*3/uL (ref 0.00–0.07)
Basophils Absolute: 0 10*3/uL (ref 0.0–0.1)
Basophils Relative: 1 %
Eosinophils Absolute: 0.1 10*3/uL (ref 0.0–0.5)
Eosinophils Relative: 2 %
HCT: 22.6 % — ABNORMAL LOW (ref 39.0–52.0)
Hemoglobin: 7.1 g/dL — ABNORMAL LOW (ref 13.0–17.0)
Immature Granulocytes: 0 %
Lymphocytes Relative: 57 %
Lymphs Abs: 3.6 10*3/uL (ref 0.7–4.0)
MCH: 32.9 pg (ref 26.0–34.0)
MCHC: 31.4 g/dL (ref 30.0–36.0)
MCV: 104.6 fL — ABNORMAL HIGH (ref 80.0–100.0)
Monocytes Absolute: 0.7 10*3/uL (ref 0.1–1.0)
Monocytes Relative: 11 %
Neutro Abs: 1.9 10*3/uL (ref 1.7–7.7)
Neutrophils Relative %: 29 %
Platelets: 26 10*3/uL — ABNORMAL LOW (ref 150–400)
RBC: 2.16 MIL/uL — ABNORMAL LOW (ref 4.22–5.81)
RDW: 16.2 % — ABNORMAL HIGH (ref 11.5–15.5)
WBC: 6.3 10*3/uL (ref 4.0–10.5)
nRBC: 0 % (ref 0.0–0.2)

## 2023-08-18 LAB — CMP (CANCER CENTER ONLY)
ALT: 15 U/L (ref 0–44)
AST: 14 U/L — ABNORMAL LOW (ref 15–41)
Albumin: 3 g/dL — ABNORMAL LOW (ref 3.5–5.0)
Alkaline Phosphatase: 127 U/L — ABNORMAL HIGH (ref 38–126)
Anion gap: 4 — ABNORMAL LOW (ref 5–15)
BUN: 36 mg/dL — ABNORMAL HIGH (ref 8–23)
CO2: 21 mmol/L — ABNORMAL LOW (ref 22–32)
Calcium: 7.7 mg/dL — ABNORMAL LOW (ref 8.9–10.3)
Chloride: 111 mmol/L (ref 98–111)
Creatinine: 2.24 mg/dL — ABNORMAL HIGH (ref 0.61–1.24)
GFR, Estimated: 28 mL/min — ABNORMAL LOW (ref >60)
Glucose, Bld: 143 mg/dL — ABNORMAL HIGH (ref 70–99)
Potassium: 3.9 mmol/L (ref 3.5–5.1)
Sodium: 136 mmol/L (ref 135–145)
Total Bilirubin: 0.7 mg/dL (ref <1.2)
Total Protein: 4.9 g/dL — ABNORMAL LOW (ref 6.5–8.1)

## 2023-08-18 LAB — SAMPLE TO BLOOD BANK

## 2023-08-18 MED ORDER — EPOETIN ALFA-EPBX 40000 UNIT/ML IJ SOLN
40000.0000 [IU] | Freq: Once | INTRAMUSCULAR | Status: AC
Start: 2023-08-18 — End: 2023-08-18
  Administered 2023-08-18: 40000 [IU] via SUBCUTANEOUS
  Filled 2023-08-18: qty 1

## 2023-08-21 ENCOUNTER — Inpatient Hospital Stay: Payer: Medicare Other

## 2023-08-21 DIAGNOSIS — D631 Anemia in chronic kidney disease: Secondary | ICD-10-CM

## 2023-08-21 DIAGNOSIS — N1832 Chronic kidney disease, stage 3b: Secondary | ICD-10-CM | POA: Diagnosis not present

## 2023-08-21 MED ORDER — SODIUM CHLORIDE 0.9% IV SOLUTION
250.0000 mL | INTRAVENOUS | Status: DC
  Administered 2023-08-21: 250 mL via INTRAVENOUS
  Filled 2023-08-21: qty 250

## 2023-08-21 MED ORDER — DIPHENHYDRAMINE HCL 50 MG/ML IJ SOLN
25.0000 mg | Freq: Once | INTRAMUSCULAR | Status: AC
  Administered 2023-08-21: 25 mg via INTRAVENOUS
  Filled 2023-08-21: qty 1

## 2023-08-21 MED ORDER — ACETAMINOPHEN 325 MG PO TABS
650.0000 mg | ORAL_TABLET | Freq: Once | ORAL | Status: AC
  Administered 2023-08-21: 650 mg via ORAL
  Filled 2023-08-21: qty 2

## 2023-08-22 LAB — BPAM RBC
Blood Product Expiration Date: 202411292359
ISSUE DATE / TIME: 202411111315
Unit Type and Rh: 6200

## 2023-08-22 LAB — TYPE AND SCREEN
ABO/RH(D): A POS
Antibody Screen: NEGATIVE
Unit division: 0

## 2023-08-25 ENCOUNTER — Ambulatory Visit
Admission: RE | Admit: 2023-08-25 | Discharge: 2023-08-25 | Disposition: A | Discharge status: Home / Self Care | Payer: Medicare Other | Source: Ambulatory Visit | Attending: Vascular Surgery | Admitting: Vascular Surgery

## 2023-08-25 ENCOUNTER — Encounter: Payer: Self-pay | Admitting: Oncology

## 2023-08-25 ENCOUNTER — Other Ambulatory Visit: Payer: Self-pay

## 2023-08-25 DIAGNOSIS — I7143 Infrarenal abdominal aortic aneurysm, without rupture: Secondary | ICD-10-CM | POA: Diagnosis present

## 2023-08-25 MED ORDER — RSVPREF3 VAC RECOMB ADJUVANTED 120 MCG/0.5ML IM SUSR
0.5000 mL | Freq: Once | INTRAMUSCULAR | 0 refills | Status: DC
  Filled 2023-08-25 (×2): qty 0.5, 1d supply, fill #0

## 2023-09-01 ENCOUNTER — Ambulatory Visit (INDEPENDENT_AMBULATORY_CARE_PROVIDER_SITE_OTHER): Payer: Medicare Other | Admitting: Vascular Surgery

## 2023-09-05 ENCOUNTER — Ambulatory Visit (INDEPENDENT_AMBULATORY_CARE_PROVIDER_SITE_OTHER): Payer: Medicare Other | Admitting: Vascular Surgery

## 2023-09-05 ENCOUNTER — Encounter (INDEPENDENT_AMBULATORY_CARE_PROVIDER_SITE_OTHER): Payer: Self-pay | Admitting: Vascular Surgery

## 2023-09-05 VITALS — BP 131/63 | HR 65 | Resp 16

## 2023-09-05 DIAGNOSIS — I7143 Infrarenal abdominal aortic aneurysm, without rupture: Secondary | ICD-10-CM

## 2023-09-05 NOTE — Progress Notes (Signed)
Unable to see the patient today in clinic.  I had to leave for procedure.  I did review the patient's CT scan.  There is a stable 5.3 cm infrarenal abdominal aortic aneurysm and the patient has elected to forego repair until we reach at least 5.5 cm.  Follow-up in 3 months with CT scan of the abdomen.

## 2023-09-18 ENCOUNTER — Other Ambulatory Visit: Payer: Self-pay | Admitting: *Deleted

## 2023-09-18 ENCOUNTER — Inpatient Hospital Stay: Payer: Medicare Other

## 2023-09-18 ENCOUNTER — Other Ambulatory Visit: Payer: Self-pay | Admitting: Oncology

## 2023-09-18 ENCOUNTER — Inpatient Hospital Stay: Payer: Medicare Other | Attending: Oncology

## 2023-09-18 VITALS — BP 146/59

## 2023-09-18 DIAGNOSIS — D631 Anemia in chronic kidney disease: Secondary | ICD-10-CM | POA: Insufficient documentation

## 2023-09-18 DIAGNOSIS — N1832 Chronic kidney disease, stage 3b: Secondary | ICD-10-CM | POA: Insufficient documentation

## 2023-09-18 DIAGNOSIS — D696 Thrombocytopenia, unspecified: Secondary | ICD-10-CM

## 2023-09-18 DIAGNOSIS — C9110 Chronic lymphocytic leukemia of B-cell type not having achieved remission: Secondary | ICD-10-CM | POA: Diagnosis present

## 2023-09-18 LAB — CBC WITH DIFFERENTIAL (CANCER CENTER ONLY)
Abs Immature Granulocytes: 0.02 10*3/uL (ref 0.00–0.07)
Basophils Absolute: 0 10*3/uL (ref 0.0–0.1)
Basophils Relative: 0 %
Eosinophils Absolute: 0.2 10*3/uL (ref 0.0–0.5)
Eosinophils Relative: 2 %
HCT: 23.9 % — ABNORMAL LOW (ref 39.0–52.0)
Hemoglobin: 7.5 g/dL — ABNORMAL LOW (ref 13.0–17.0)
Immature Granulocytes: 0 %
Lymphocytes Relative: 66 %
Lymphs Abs: 6.6 10*3/uL — ABNORMAL HIGH (ref 0.7–4.0)
MCH: 31.5 pg (ref 26.0–34.0)
MCHC: 31.4 g/dL (ref 30.0–36.0)
MCV: 100.4 fL — ABNORMAL HIGH (ref 80.0–100.0)
Monocytes Absolute: 0.2 10*3/uL (ref 0.1–1.0)
Monocytes Relative: 2 %
Neutro Abs: 3 10*3/uL (ref 1.7–7.7)
Neutrophils Relative %: 30 %
Platelet Count: 40 10*3/uL — ABNORMAL LOW (ref 150–400)
RBC: 2.38 MIL/uL — ABNORMAL LOW (ref 4.22–5.81)
RDW: 15.6 % — ABNORMAL HIGH (ref 11.5–15.5)
WBC Count: 10 10*3/uL (ref 4.0–10.5)
nRBC: 0 % (ref 0.0–0.2)

## 2023-09-18 LAB — CMP (CANCER CENTER ONLY)
ALT: 13 U/L (ref 0–44)
AST: 15 U/L (ref 15–41)
Albumin: 2.9 g/dL — ABNORMAL LOW (ref 3.5–5.0)
Alkaline Phosphatase: 127 U/L — ABNORMAL HIGH (ref 38–126)
Anion gap: 10 (ref 5–15)
BUN: 37 mg/dL — ABNORMAL HIGH (ref 8–23)
CO2: 21 mmol/L — ABNORMAL LOW (ref 22–32)
Calcium: 8.4 mg/dL — ABNORMAL LOW (ref 8.9–10.3)
Chloride: 107 mmol/L (ref 98–111)
Creatinine: 2.33 mg/dL — ABNORMAL HIGH (ref 0.61–1.24)
GFR, Estimated: 26 mL/min — ABNORMAL LOW (ref >60)
Glucose, Bld: 98 mg/dL (ref 70–99)
Potassium: 3.5 mmol/L (ref 3.5–5.1)
Sodium: 138 mmol/L (ref 135–145)
Total Bilirubin: 0.7 mg/dL (ref <1.2)
Total Protein: 5 g/dL — ABNORMAL LOW (ref 6.5–8.1)

## 2023-09-18 LAB — PREPARE RBC (CROSSMATCH)

## 2023-09-18 MED ORDER — EPOETIN ALFA-EPBX 40000 UNIT/ML IJ SOLN
40000.0000 [IU] | Freq: Once | INTRAMUSCULAR | Status: AC
  Administered 2023-09-18: 40000 [IU] via SUBCUTANEOUS
  Filled 2023-09-18: qty 1

## 2023-09-19 ENCOUNTER — Inpatient Hospital Stay: Payer: Medicare Other

## 2023-09-19 DIAGNOSIS — N1832 Chronic kidney disease, stage 3b: Secondary | ICD-10-CM | POA: Diagnosis not present

## 2023-09-19 DIAGNOSIS — D631 Anemia in chronic kidney disease: Secondary | ICD-10-CM

## 2023-09-19 MED ORDER — SODIUM CHLORIDE 0.9% IV SOLUTION
250.0000 mL | INTRAVENOUS | Status: DC
  Administered 2023-09-19: 100 mL via INTRAVENOUS
  Filled 2023-09-19: qty 250

## 2023-09-19 MED ORDER — DIPHENHYDRAMINE HCL 50 MG/ML IJ SOLN
25.0000 mg | Freq: Once | INTRAMUSCULAR | Status: AC
  Administered 2023-09-19: 25 mg via INTRAVENOUS
  Filled 2023-09-19: qty 1

## 2023-09-19 MED ORDER — ACETAMINOPHEN 325 MG PO TABS
650.0000 mg | ORAL_TABLET | Freq: Once | ORAL | Status: AC
  Administered 2023-09-19: 650 mg via ORAL
  Filled 2023-09-19: qty 2

## 2023-09-20 LAB — BPAM RBC
Blood Product Expiration Date: 202412202359
ISSUE DATE / TIME: 202412100947
Unit Type and Rh: 202412202359
Unit Type and Rh: 6200

## 2023-09-20 LAB — TYPE AND SCREEN
ABO/RH(D): A POS
Antibody Screen: NEGATIVE
Unit division: 0

## 2023-10-19 ENCOUNTER — Inpatient Hospital Stay (HOSPITAL_BASED_OUTPATIENT_CLINIC_OR_DEPARTMENT_OTHER): Payer: Medicare Other | Admitting: Oncology

## 2023-10-19 ENCOUNTER — Inpatient Hospital Stay: Payer: Medicare Other

## 2023-10-19 ENCOUNTER — Inpatient Hospital Stay: Payer: Medicare Other | Attending: Oncology

## 2023-10-19 ENCOUNTER — Encounter: Payer: Self-pay | Admitting: Oncology

## 2023-10-19 VITALS — BP 158/72 | HR 58 | Temp 98.2°F | Resp 16 | Ht 66.0 in | Wt 174.5 lb

## 2023-10-19 DIAGNOSIS — N1832 Chronic kidney disease, stage 3b: Secondary | ICD-10-CM | POA: Insufficient documentation

## 2023-10-19 DIAGNOSIS — C9110 Chronic lymphocytic leukemia of B-cell type not having achieved remission: Secondary | ICD-10-CM | POA: Insufficient documentation

## 2023-10-19 DIAGNOSIS — N189 Chronic kidney disease, unspecified: Secondary | ICD-10-CM

## 2023-10-19 DIAGNOSIS — D631 Anemia in chronic kidney disease: Secondary | ICD-10-CM

## 2023-10-19 DIAGNOSIS — D696 Thrombocytopenia, unspecified: Secondary | ICD-10-CM

## 2023-10-19 LAB — SAMPLE TO BLOOD BANK

## 2023-10-19 LAB — CMP (CANCER CENTER ONLY)
ALT: 14 U/L (ref 0–44)
AST: 15 U/L (ref 15–41)
Albumin: 3.2 g/dL — ABNORMAL LOW (ref 3.5–5.0)
Alkaline Phosphatase: 109 U/L (ref 38–126)
Anion gap: 8 (ref 5–15)
BUN: 38 mg/dL — ABNORMAL HIGH (ref 8–23)
CO2: 23 mmol/L (ref 22–32)
Calcium: 8.3 mg/dL — ABNORMAL LOW (ref 8.9–10.3)
Chloride: 109 mmol/L (ref 98–111)
Creatinine: 2.56 mg/dL — ABNORMAL HIGH (ref 0.61–1.24)
GFR, Estimated: 24 mL/min — ABNORMAL LOW (ref >60)
Glucose, Bld: 139 mg/dL — ABNORMAL HIGH (ref 70–99)
Potassium: 3.8 mmol/L (ref 3.5–5.1)
Sodium: 140 mmol/L (ref 135–145)
Total Bilirubin: 0.6 mg/dL (ref 0.0–1.2)
Total Protein: 5.1 g/dL — ABNORMAL LOW (ref 6.5–8.1)

## 2023-10-19 LAB — CBC WITH DIFFERENTIAL (CANCER CENTER ONLY)
Abs Immature Granulocytes: 0.01 10*3/uL (ref 0.00–0.07)
Basophils Absolute: 0 10*3/uL (ref 0.0–0.1)
Basophils Relative: 0 %
Eosinophils Absolute: 0.2 10*3/uL (ref 0.0–0.5)
Eosinophils Relative: 2 %
HCT: 26.4 % — ABNORMAL LOW (ref 39.0–52.0)
Hemoglobin: 8.3 g/dL — ABNORMAL LOW (ref 13.0–17.0)
Immature Granulocytes: 0 %
Lymphocytes Relative: 57 %
Lymphs Abs: 4.7 10*3/uL — ABNORMAL HIGH (ref 0.7–4.0)
MCH: 31.8 pg (ref 26.0–34.0)
MCHC: 31.4 g/dL (ref 30.0–36.0)
MCV: 101.1 fL — ABNORMAL HIGH (ref 80.0–100.0)
Monocytes Absolute: 0.7 10*3/uL (ref 0.1–1.0)
Monocytes Relative: 8 %
Neutro Abs: 2.8 10*3/uL (ref 1.7–7.7)
Neutrophils Relative %: 33 %
Platelet Count: 29 10*3/uL — ABNORMAL LOW (ref 150–400)
RBC: 2.61 MIL/uL — ABNORMAL LOW (ref 4.22–5.81)
RDW: 16.2 % — ABNORMAL HIGH (ref 11.5–15.5)
WBC Count: 8.4 10*3/uL (ref 4.0–10.5)
nRBC: 0 % (ref 0.0–0.2)

## 2023-10-19 MED ORDER — EPOETIN ALFA-EPBX 40000 UNIT/ML IJ SOLN
40000.0000 [IU] | Freq: Once | INTRAMUSCULAR | Status: AC
  Administered 2023-10-19: 40000 [IU] via SUBCUTANEOUS
  Filled 2023-10-19: qty 1

## 2023-10-19 NOTE — Progress Notes (Signed)
 Patient has concerns about lack of energy and poor vision.

## 2023-10-19 NOTE — Progress Notes (Signed)
 [facility]  [phone] [phone]  ID: Patrick Payne Dusenbery Jr. OB: 09/14/1934  MR#: [id number]  RDW#:[id number]  Patient Care Team: Patrick Reyes BIRCH, MD as PCP - General (Internal Medicine) Patrick Evalene PARAS, MD as Consulting Physician (Oncology)  CHIEF COMPLAINT: CLL, anemia secondary to chronic renal insufficiency, thrombocytopenia.  INTERVAL HISTORY: Patient returns to clinic today for repeat laboratory work, further evaluation, and continuation of Retacrit .  He continues to have chronic fatigue, but otherwise feels well.  His eyesight continues to get worse. He denies any fevers, night sweats, or weight loss. He has noted no new lymphadenopathy.  He has no neurologic complaints today.  He denies any chest pain, shortness of breath, cough, or hemoptysis.  He denies any nausea, vomiting, constipation, or diarrhea. He has no urinary complaints.  Patient offers no further specific complaints today.  REVIEW OF SYSTEMS:   Review of Systems  Constitutional:  Positive for malaise/fatigue. Negative for diaphoresis, fever and weight loss.  Respiratory: Negative.  Negative for cough and shortness of breath.   Cardiovascular: Negative.  Negative for chest pain and leg swelling.  Gastrointestinal: Negative.  Negative for abdominal pain, blood in stool and melena.  Genitourinary: Negative.  Negative for dysuria.  Musculoskeletal: Negative.  Negative for back pain.  Skin: Negative.  Negative for rash.  Neurological:  Positive for weakness. Negative for dizziness, sensory change, focal weakness and headaches.  Psychiatric/Behavioral: Negative.  The patient is not nervous/anxious.     As per HPI. Otherwise, a complete review of systems is negative.  PAST MEDICAL HISTORY: Past Medical History:  Diagnosis Date   Anxiety    CKD (chronic kidney disease), stage III (HCC)    CLL (chronic lymphocytic leukemia) (HCC)    Diabetes mellitus without complication  (HCC)    Diverticulitis 04/09/2017   History of kidney stones    HLD (hyperlipidemia)    HTN (hypertension)    PKD (polycystic kidney disease)    Sleep apnea    Uncontrolled type 2 diabetes mellitus with hyperglycemia (HCC) 06/28/2018    PAST SURGICAL HISTORY: Past Surgical History:  Procedure Laterality Date   CARDIAC CATHETERIZATION  12/1987   CENTRAL LINE INSERTION N/A 04/11/2017   Procedure: Central Line Insertion;  Surgeon: Jama Cordella MATSU, MD;  Location: [facility];  Service: Cardiovascular;  Laterality: N/A;   CERVICAL FUSION     CHOLECYSTECTOMY     CYSTOSCOPY W/ RETROGRADES Left 05/12/2017   Procedure: CYSTOSCOPY WITH RETROGRADE PYELOGRAM;  Surgeon: Chauncey Redell Agent, MD;  Location: [facility];  Service: Urology;  Laterality: Left;   CYSTOSCOPY W/ URETERAL STENT PLACEMENT Left 05/12/2017   Procedure: CYSTOSCOPY WITH STENT REMOVAL;  Surgeon: Chauncey Redell Agent, MD;  Location: [facility];  Service: Urology;  Laterality: Left;   CYSTOSCOPY WITH STENT PLACEMENT Left 04/11/2017   Procedure: CYSTOSCOPY WITH STENT PLACEMENT;  Surgeon: Nieves Cough, MD;  Location: [facility];  Service: Urology;  Laterality: Left;   URETEROSCOPY  05/12/2017   Procedure: URETEROSCOPY;  Surgeon: Chauncey Redell Agent, MD;  Location: [facility];  Service: Urology;;    FAMILY HISTORY: Reported history of ovarian and lung cancer. Diabetes, hypertension.     ADVANCED DIRECTIVES:    HEALTH MAINTENANCE: Social History   Tobacco Use   Smoking status: Former    Passive exposure: Past   Smokeless tobacco: Never   Tobacco comments:    quit in Feb of 1997  Substance Use Topics   Alcohol use: No   Drug use: No  Colonoscopy:  PAP:  Bone density:  Lipid panel:  Allergies  Allergen Reactions   Celecoxib Other (See Comments)    Increased Blood Pressure   Chlorpromazine Nausea Only   Iodinated Contrast Media Other (See Comments)    Stage 3 kidney disease, told IV dye would be bad for him    Prednisone Other (See Comments)    Diabetic   Amoxicillin-Pot Clavulanate Rash    Has patient had a PCN reaction causing immediate rash, facial/tongue/throat swelling, SOB or lightheadedness with hypotension: No Has patient had a PCN reaction causing severe rash involving mucus membranes or skin necrosis: No Has patient had a PCN reaction that required hospitalization: No Has patient had a PCN reaction occurring within the last 10 years: Yes If all of the above answers are NO, then may proceed with Cephalosporin use.    Penicillin V Potassium Rash    Current Outpatient Medications  Medication Sig Dispense Refill   Aflibercept 2 MG/0.05ML SOLN Inject 1 Dose into the eye as directed. One injection into left eye every 8-9 weeks     allopurinol (ZYLOPRIM) 100 MG tablet Take 100 mg by mouth daily.     atorvastatin  (LIPITOR) 10 MG tablet Take 10 mg by mouth at bedtime.      BD PEN NEEDLE NANO 2ND GEN 32G X 4 MM MISC USE AS DIRECTED ONCE A DAY     carvedilol  (COREG ) 12.5 MG tablet Take 12.5 mg by mouth 2 (two) times daily with a meal.     Cholecalciferol (VITAMIN D -3) 5000 units TABS Take 5,000 Units by mouth at bedtime.      clonazePAM  (KLONOPIN ) 1 MG tablet Take 1 mg by mouth at bedtime.      CONTOUR NEXT TEST test strip daily.     cyanocobalamin 500 MCG tablet Take 1,000 mcg by mouth at bedtime.      empagliflozin (JARDIANCE) 10 MG TABS tablet 10 mg daily.     epoetin  alfa-epbx (RETACRIT ) 40000 UNIT/ML injection Inject 40,000 Units into the skin every 6 (six) weeks. Depending on lab results (if needed)     fexofenadine (ALLEGRA) 180 MG tablet Take 180 mg by mouth daily as needed for allergies.     finasteride  (PROSCAR ) 5 MG tablet Take 1 tablet (5 mg total) by mouth daily. 90 tablet 3   folic acid  (FOLVITE ) 400 MCG tablet Take 400 mcg by mouth daily.     insulin  glargine (LANTUS ) 100 UNIT/ML injection Inject 20-30 Units into the skin every morning. Takes Lantus  20 units if CBG less than 120  mg/dl or Lantus  30 units if CBG is over 120 mg/dl     Multiple Vitamins-Minerals (PRESERVISION AREDS 2 PO) Take 1 capsule by mouth 2 (two) times daily.      omega-3 acid ethyl esters (LOVAZA) 1 g capsule Take by mouth 2 (two) times daily.     prednisoLONE acetate (PRED FORTE) 1 % ophthalmic suspension Place 1 drop into the right eye 4 (four) times daily.     sertraline (ZOLOFT) 50 MG tablet Take 100 mg by mouth daily.     tamsulosin  (FLOMAX ) 0.4 MG CAPS capsule TAKE 1 CAPSULE(0.4 MG) BY MOUTH DAILY AFTER SUPPER 90 capsule 3   telmisartan (MICARDIS) 40 MG tablet Take 40 mg by mouth daily.     triamcinolone (NASACORT) 55 MCG/ACT AERO nasal inhaler Place 2 sprays into the nose 2 (two) times daily as needed (allergies).      No current facility-administered medications for this visit.  Facility-Administered Medications Ordered in Other Visits  Medication Dose Route Frequency Provider Last Rate Last Admin   epoetin  alfa-epbx (RETACRIT ) injection 40,000 Units  40,000 Units Subcutaneous Once Patrick Weightman J, MD       epoetin  alfa-epbx (RETACRIT ) injection 40,000 Units  40,000 Units Subcutaneous Once Patrick Evalene PARAS, MD        OBJECTIVE: Vitals:   10/19/23 1445  BP: (!) 158/72  Pulse: (!) 58  Resp: 16  Temp: 98.2 F (36.8 C)  SpO2: 100%      Body mass index is 28.17 kg/m.    ECOG FS:1 - Symptomatic but completely ambulatory  General: Well-developed, well-nourished, no acute distress. Eyes: Pink conjunctiva, anicteric sclera. HEENT: Normocephalic, moist mucous membranes. Lungs: No audible wheezing or coughing. Heart: Regular rate and rhythm. Abdomen: Soft, nontender, no obvious distention. Musculoskeletal: No edema, cyanosis, or clubbing. Neuro: Alert, answering all questions appropriately. Cranial nerves grossly intact. Skin: No rashes or petechiae noted. Psych: Normal affect.  LAB RESULTS:  Lab Results  Component Value Date   NA 140 10/19/2023   K 3.8 10/19/2023   CL  109 10/19/2023   CO2 23 10/19/2023   GLUCOSE 139 (H) 10/19/2023   BUN 38 (H) 10/19/2023   CREATININE 2.56 (H) 10/19/2023   CALCIUM  8.3 (L) 10/19/2023   PROT 5.1 (L) 10/19/2023   ALBUMIN 3.2 (L) 10/19/2023   AST 15 10/19/2023   ALT 14 10/19/2023   ALKPHOS 109 10/19/2023   BILITOT 0.6 10/19/2023   GFRNONAA 24 (L) 10/19/2023   GFRAA 37 (L) 11/19/2019    Lab Results  Component Value Date   WBC 8.4 10/19/2023   NEUTROABS 2.8 10/19/2023   HGB 8.3 (L) 10/19/2023   HCT 26.4 (L) 10/19/2023   MCV 101.1 (H) 10/19/2023   PLT 29 (L) 10/19/2023   Lab Results  Component Value Date   IRON 67 12/15/2022   TIBC 251 12/15/2022   IRONPCTSAT 27 12/15/2022   Lab Results  Component Value Date   FERRITIN 31 08/25/2021     STUDIES: No results found.  ASSESSMENT: Rai stage I CLL, anemia secondary to chronic renal insufficiency.  PLAN:    Anemia secondary to chronic renal insufficiency: Chronic and unchanged.  Patient's hemoglobin is 8.3 today. Patient return to clinic in 1 and 2 months for laboratory work and Retacrit  only if his hemoglobin remains below 10.0.  He will then return to clinic in 3 months with repeat laboratory work, further evaluation, and continuation of treatment if needed.  We also discussed the possibility of increasing the frequency of Retacrit , but patient declined at this time. CLL: Chronic and unchanged.  Patient's total white blood cell count has ranged between 8.4 and 24.8 since May 2013.  Today's result is 8.4.  No intervention is needed.  Patient does not require bone marrow biopsy or repeat imaging.  Continue simple observation. Thrombocytopenia: Patient required multiple transfusions of platelets prior to his oral surgery.  He did not have any excessive bleeding during his surgery.  Platelet count today is 29 which is slightly decreased from his baseline.  His count was as high as 95 in June 2016.  But more recently between has ranged from 32-63 since June 2019.   Hesitant to use steroids given his underlying diabetes and is unclear if Rituxan will be effective.  He does not require transfusion today.  Return to clinic as scheduled.   Lymphadenopathy: CT scan results from April 09, 2017 reviewed independently with enlarged pelvic and retroperitoneal lymph nodes.  These were also noted on a scan in 2012 appear to be slightly larger. No intervention is needed. No further imaging is necessary unless there is suspicion of progression of disease.  Aortic aneurysm: Appreciate vascular surgery input.  Patient elected to hold off any procedures until his aneurysm reaches 5.5 cm. Dental surgery: Successful and resolved.  I spent a total of 30 minutes reviewing chart data, face-to-face evaluation with the patient, counseling and coordination of care as detailed above.   Patient expressed understanding and was in agreement with this plan. He also understands that He can call clinic at any time with any questions, concerns, or complaints.   Evalene JINNY Reusing, MD   10/19/2023 3:07 PM

## 2023-11-20 ENCOUNTER — Inpatient Hospital Stay: Payer: Medicare Other

## 2023-11-20 ENCOUNTER — Inpatient Hospital Stay: Payer: Medicare Other | Attending: Oncology

## 2023-11-20 VITALS — BP 145/55

## 2023-11-20 DIAGNOSIS — D631 Anemia in chronic kidney disease: Secondary | ICD-10-CM

## 2023-11-20 DIAGNOSIS — C9110 Chronic lymphocytic leukemia of B-cell type not having achieved remission: Secondary | ICD-10-CM | POA: Diagnosis present

## 2023-11-20 DIAGNOSIS — N1831 Chronic kidney disease, stage 3a: Secondary | ICD-10-CM | POA: Insufficient documentation

## 2023-11-20 DIAGNOSIS — D696 Thrombocytopenia, unspecified: Secondary | ICD-10-CM

## 2023-11-20 DIAGNOSIS — N1832 Chronic kidney disease, stage 3b: Secondary | ICD-10-CM | POA: Diagnosis present

## 2023-11-20 LAB — CBC WITH DIFFERENTIAL (CANCER CENTER ONLY)
Abs Immature Granulocytes: 0.02 10*3/uL (ref 0.00–0.07)
Basophils Absolute: 0 10*3/uL (ref 0.0–0.1)
Basophils Relative: 0 %
Eosinophils Absolute: 0.1 10*3/uL (ref 0.0–0.5)
Eosinophils Relative: 2 %
HCT: 24.6 % — ABNORMAL LOW (ref 39.0–52.0)
Hemoglobin: 7.6 g/dL — ABNORMAL LOW (ref 13.0–17.0)
Immature Granulocytes: 0 %
Lymphocytes Relative: 56 %
Lymphs Abs: 4.5 10*3/uL — ABNORMAL HIGH (ref 0.7–4.0)
MCH: 32.2 pg (ref 26.0–34.0)
MCHC: 30.9 g/dL (ref 30.0–36.0)
MCV: 104.2 fL — ABNORMAL HIGH (ref 80.0–100.0)
Monocytes Absolute: 0.7 10*3/uL (ref 0.1–1.0)
Monocytes Relative: 8 %
Neutro Abs: 2.8 10*3/uL (ref 1.7–7.7)
Neutrophils Relative %: 34 %
Platelet Count: 26 10*3/uL — ABNORMAL LOW (ref 150–400)
RBC: 2.36 MIL/uL — ABNORMAL LOW (ref 4.22–5.81)
RDW: 16.1 % — ABNORMAL HIGH (ref 11.5–15.5)
Smear Review: NORMAL
WBC Count: 8.1 10*3/uL (ref 4.0–10.5)
nRBC: 0 % (ref 0.0–0.2)

## 2023-11-20 LAB — CMP (CANCER CENTER ONLY)
ALT: 18 U/L (ref 0–44)
AST: 15 U/L (ref 15–41)
Albumin: 3 g/dL — ABNORMAL LOW (ref 3.5–5.0)
Alkaline Phosphatase: 122 U/L (ref 38–126)
Anion gap: 6 (ref 5–15)
BUN: 45 mg/dL — ABNORMAL HIGH (ref 8–23)
CO2: 23 mmol/L (ref 22–32)
Calcium: 7.8 mg/dL — ABNORMAL LOW (ref 8.9–10.3)
Chloride: 107 mmol/L (ref 98–111)
Creatinine: 2.49 mg/dL — ABNORMAL HIGH (ref 0.61–1.24)
GFR, Estimated: 24 mL/min — ABNORMAL LOW (ref >60)
Glucose, Bld: 161 mg/dL — ABNORMAL HIGH (ref 70–99)
Potassium: 4.3 mmol/L (ref 3.5–5.1)
Sodium: 136 mmol/L (ref 135–145)
Total Bilirubin: 0.8 mg/dL (ref 0.0–1.2)
Total Protein: 4.8 g/dL — ABNORMAL LOW (ref 6.5–8.1)

## 2023-11-20 LAB — SAMPLE TO BLOOD BANK

## 2023-11-20 MED ORDER — EPOETIN ALFA-EPBX 40000 UNIT/ML IJ SOLN
40000.0000 [IU] | Freq: Once | INTRAMUSCULAR | Status: AC
  Administered 2023-11-20: 40000 [IU] via SUBCUTANEOUS
  Filled 2023-11-20: qty 1

## 2023-12-06 ENCOUNTER — Ambulatory Visit
Admission: RE | Admit: 2023-12-06 | Discharge: 2023-12-06 | Disposition: A | Discharge status: Home / Self Care | Payer: Medicare Other | Source: Ambulatory Visit | Attending: Vascular Surgery | Admitting: Vascular Surgery

## 2023-12-06 DIAGNOSIS — I7143 Infrarenal abdominal aortic aneurysm, without rupture: Secondary | ICD-10-CM | POA: Diagnosis present

## 2023-12-11 ENCOUNTER — Ambulatory Visit (INDEPENDENT_AMBULATORY_CARE_PROVIDER_SITE_OTHER): Payer: Medicare Other | Admitting: Vascular Surgery

## 2023-12-11 ENCOUNTER — Telehealth: Payer: Self-pay

## 2023-12-11 NOTE — Telephone Encounter (Signed)
 Please advise.  Patient's wife is concerned with increasing fatigue and excessive sleepiness for past 1.5 week.  Noticed some bright red blood during bowel movement this morning.  Does have history of hemorrhoids.

## 2023-12-12 ENCOUNTER — Encounter (INDEPENDENT_AMBULATORY_CARE_PROVIDER_SITE_OTHER): Payer: Self-pay | Admitting: Vascular Surgery

## 2023-12-12 ENCOUNTER — Ambulatory Visit (INDEPENDENT_AMBULATORY_CARE_PROVIDER_SITE_OTHER): Payer: Medicare Other | Admitting: Vascular Surgery

## 2023-12-12 VITALS — BP 138/74 | HR 58 | Resp 18 | Ht 67.0 in | Wt 174.0 lb

## 2023-12-12 DIAGNOSIS — Z794 Long term (current) use of insulin: Secondary | ICD-10-CM

## 2023-12-12 DIAGNOSIS — I10 Essential (primary) hypertension: Secondary | ICD-10-CM | POA: Diagnosis not present

## 2023-12-12 DIAGNOSIS — E1122 Type 2 diabetes mellitus with diabetic chronic kidney disease: Secondary | ICD-10-CM

## 2023-12-12 DIAGNOSIS — N1832 Chronic kidney disease, stage 3b: Secondary | ICD-10-CM

## 2023-12-12 DIAGNOSIS — E785 Hyperlipidemia, unspecified: Secondary | ICD-10-CM

## 2023-12-12 DIAGNOSIS — I6523 Occlusion and stenosis of bilateral carotid arteries: Secondary | ICD-10-CM

## 2023-12-12 DIAGNOSIS — I7143 Infrarenal abdominal aortic aneurysm, without rupture: Secondary | ICD-10-CM

## 2023-12-12 NOTE — Assessment & Plan Note (Signed)
 Mild disease without obvious focal symptoms.  Should be checked again in the fall.

## 2023-12-12 NOTE — Assessment & Plan Note (Signed)
 Patient underwent a CT scan of the abdomen and pelvis a week ago at this point.  This was an uninfused scan because of his renal dysfunction.  This has somehow not yet been read by radiology.  Nonetheless, I have independently reviewed the CT scan and he clearly has a stable 5.3 cm infrarenal abdominal aortic aneurysm without progression from previous studies.  We have previously made the clinical decision to defer surgery until he reached at least 5.5 cm due to his litany of other medical issues.  I think that is very reasonable and we will continue this plan.  Due to the size of the aneurysm, we will going to continue to follow this on 3 to 34-month intervals and we have had a harder time seeing it with duplex for a variety of reasons and so uninfused CT scans will be the way we follow this.

## 2023-12-12 NOTE — Progress Notes (Signed)
 MRN : [id number]  Patrick Payne. is a 89 y.o. (15-Mar-1934) male who presents with chief complaint of  Chief Complaint  Patient presents with   Follow-up    CT results  .  History of Present Illness: Patient returns today in follow up of his abdominal aortic aneurysm.  He has a litany of other ongoing medical issues which are problematic including his anemia from his hematologic issues.  His chronic kidney disease is reasonably stable but severe.  He has been quite sleepy with his anemia.  He does not have any aneurysm related symptoms. Specifically, the patient denies new back or abdominal pain, or signs of peripheral embolization.  Patient underwent a CT scan of the abdomen and pelvis a week ago at this point.  This was an uninfused scan because of his renal dysfunction.  This has somehow not yet been read by radiology.  Nonetheless, I have independently reviewed the CT scan and he clearly has a stable 5.3 cm infrarenal abdominal aortic aneurysm without progression from previous studies.  We have previously made the clinical decision to defer surgery until he reached at least 5.5 cm due to his litany of other medical issues.  Current Outpatient Medications  Medication Sig Dispense Refill   Aflibercept 2 MG/0.05ML SOLN Inject 1 Dose into the eye as directed. One injection into left eye every 8-9 weeks     allopurinol (ZYLOPRIM) 100 MG tablet Take 100 mg by mouth daily.     atorvastatin (LIPITOR) 10 MG tablet Take 10 mg by mouth at bedtime.      BD PEN NEEDLE NANO 2ND GEN 32G X 4 MM MISC USE AS DIRECTED ONCE A DAY     carvedilol (COREG) 12.5 MG tablet Take 12.5 mg by mouth 2 (two) times daily with a meal.     Cholecalciferol (VITAMIN D-3) 5000 units TABS Take 5,000 Units by mouth at bedtime.      clonazePAM (KLONOPIN) 1 MG tablet Take 1 mg by mouth at bedtime.      CONTOUR NEXT TEST test strip daily.     cyanocobalamin 500 MCG tablet Take 1,000 mcg by mouth at bedtime.       empagliflozin (JARDIANCE) 10 MG TABS tablet 10 mg daily.     epoetin alfa-epbx (RETACRIT) 40981 UNIT/ML injection Inject 40,000 Units into the skin every 6 (six) weeks. Depending on lab results (if needed)     fexofenadine (ALLEGRA) 180 MG tablet Take 180 mg by mouth daily as needed for allergies.     finasteride (PROSCAR) 5 MG tablet Take 1 tablet (5 mg total) by mouth daily. 90 tablet 3   folic acid (FOLVITE) 400 MCG tablet Take 400 mcg by mouth daily.     insulin glargine (LANTUS) 100 UNIT/ML injection Inject 20-30 Units into the skin every morning. Takes Lantus 20 units if CBG less than 120 mg/dl or Lantus 30 units if CBG is over 120 mg/dl     Multiple Vitamins-Minerals (PRESERVISION AREDS 2 PO) Take 1 capsule by mouth 2 (two) times daily.      omega-3 acid ethyl esters (LOVAZA) 1 g capsule Take by mouth 2 (two) times daily.     prednisoLONE acetate (PRED FORTE) 1 % ophthalmic suspension Place 1 drop into the right eye 4 (four) times daily.     sertraline (ZOLOFT) 50 MG tablet Take 100 mg by mouth daily.     tamsulosin (FLOMAX) 0.4 MG CAPS capsule TAKE 1 CAPSULE(0.4 MG) BY MOUTH DAILY AFTER  SUPPER 90 capsule 3   telmisartan (MICARDIS) 40 MG tablet Take 40 mg by mouth daily.     torsemide (DEMADEX) 10 MG tablet Take by mouth.     triamcinolone (NASACORT) 55 MCG/ACT AERO nasal inhaler Place 2 sprays into the nose 2 (two) times daily as needed (allergies).      No current facility-administered medications for this visit.   Facility-Administered Medications Ordered in Other Visits  Medication Dose Route Frequency Provider Last Rate Last Admin   epoetin alfa-epbx (RETACRIT) injection 40,000 Units  40,000 Units Subcutaneous Once Jeralyn Ruths, MD        Past Medical History:  Diagnosis Date   Anxiety    CKD (chronic kidney disease), stage III (HCC)    CLL (chronic lymphocytic leukemia) (HCC)    Diabetes mellitus without complication (HCC)    Diverticulitis 04/09/2017   History of  kidney stones    HLD (hyperlipidemia)    HTN (hypertension)    PKD (polycystic kidney disease)    Sleep apnea    Uncontrolled type 2 diabetes mellitus with hyperglycemia (HCC) 06/28/2018    Past Surgical History:  Procedure Laterality Date   CARDIAC CATHETERIZATION  12/1987   CENTRAL LINE INSERTION N/A 04/11/2017   Procedure: Central Line Insertion;  Surgeon: Renford Dills, MD;  Location: [facility];  Service: Cardiovascular;  Laterality: N/A;   CERVICAL FUSION     CHOLECYSTECTOMY     CYSTOSCOPY W/ RETROGRADES Left 05/12/2017   Procedure: CYSTOSCOPY WITH RETROGRADE PYELOGRAM;  Surgeon: Hildred Laser, MD;  Location: [facility];  Service: Urology;  Laterality: Left;   CYSTOSCOPY W/ URETERAL STENT PLACEMENT Left 05/12/2017   Procedure: CYSTOSCOPY WITH STENT REMOVAL;  Surgeon: Hildred Laser, MD;  Location: [facility];  Service: Urology;  Laterality: Left;   CYSTOSCOPY WITH STENT PLACEMENT Left 04/11/2017   Procedure: CYSTOSCOPY WITH STENT PLACEMENT;  Surgeon: Jerilee Field, MD;  Location: [facility];  Service: Urology;  Laterality: Left;   URETEROSCOPY  05/12/2017   Procedure: URETEROSCOPY;  Surgeon: Hildred Laser, MD;  Location: [facility];  Service: Urology;;     Social History   Tobacco Use   Smoking status: Former    Passive exposure: Past   Smokeless tobacco: Never   Tobacco comments:    quit in Feb of 1997  Substance Use Topics   Alcohol use: No   Drug use: No      Family History  Problem Relation Age of Onset   Cancer Mother    Varicose Veins Mother    Cancer Father      Allergies  Allergen Reactions   Celecoxib Other (See Comments)    Increased Blood Pressure   Chlorpromazine Nausea Only   Iodinated Contrast Media Other (See Comments)    Stage 3 kidney disease, told IV dye would be bad for him   Prednisone Other (See Comments)    Diabetic   Amoxicillin-Pot Clavulanate Rash    Has patient had a PCN reaction causing immediate rash,  facial/tongue/throat swelling, SOB or lightheadedness with hypotension: No Has patient had a PCN reaction causing severe rash involving mucus membranes or skin necrosis: No Has patient had a PCN reaction that required hospitalization: No Has patient had a PCN reaction occurring within the last 10 years: Yes If all of the above answers are "NO", then may proceed with Cephalosporin use.    Penicillin V Potassium Rash     REVIEW OF SYSTEMS (Negative unless checked)   Constitutional: [] Weight loss  []   Fever  [] Chills Cardiac: [] Chest pain   [] Chest pressure   [] Palpitations   [] Shortness of breath when laying flat   [] Shortness of breath at rest   [] Shortness of breath with exertion. Vascular:  [] Pain in legs with walking   [] Pain in legs at rest   [] Pain in legs when laying flat   [] Claudication   [] Pain in feet when walking  [] Pain in feet at rest  [] Pain in feet when laying flat   [] History of DVT   [] Phlebitis   [] Swelling in legs   [] Varicose veins   [] Non-healing ulcers Pulmonary:   [] Uses home oxygen   [] Productive cough   [] Hemoptysis   [] Wheeze  [] COPD   [] Asthma Neurologic:  [] Dizziness  [] Blackouts   [] Seizures   [] History of stroke   [] History of TIA  [] Aphasia   [] Temporary blindness   [] Dysphagia   [] Weakness or numbness in arms   [] Weakness or numbness in legs Musculoskeletal:  [x] Arthritis   [] Joint swelling   [x] Joint pain   [] Low back pain Hematologic:  [] Easy bruising  [] Easy bleeding   [] Hypercoagulable state   [x] Anemic   Gastrointestinal:  [] Blood in stool   [] Vomiting blood  [x] Gastroesophageal reflux/heartburn   [] Abdominal pain Genitourinary:  [x] Chronic kidney disease   [] Difficult urination  [] Frequent urination  [] Burning with urination   [] Hematuria Skin:  [] Rashes   [] Ulcers   [] Wounds Psychological:  [x] History of anxiety   []  History of major depression.  Physical Examination  BP 138/74   Pulse (!) 58   Resp 18   Ht 5\' 7"  (1.702 m)   Wt 174 lb (78.9 kg)   BMI  27.25 kg/m  Gen:  WD/WN, NAD Head: [place]/AT, No temporalis wasting. Ear/Nose/Throat: Hearing grossly intact, nares w/o erythema or drainage Eyes: Conjunctiva clear. Sclera non-icteric Neck: Supple.  Trachea midline Pulmonary:  Good air movement, no use of accessory muscles.  Cardiac: bradycardia Vascular:  Vessel Right Left  Radial Palpable Palpable                                   Gastrointestinal: soft, non-tender/non-distended. No guarding/reflex. Aorta not easily palpable  Musculoskeletal: M/S 5/5 throughout.  No deformity or atrophy. Mild LE edema. Neurologic: Sensation grossly intact in extremities.  Symmetrical.  Speech is fluent.  Psychiatric: Judgment intact, Mood & affect appropriate for pt's clinical situation. Dermatologic: Multiple scabs and superficial lesions are on his scalp.      Labs Recent Results (from the past 2160 hours)  CMP (Cancer Center only)     Status: Abnormal   Collection Time: 09/18/23  1:44 PM  Result Value Ref Range   Sodium 138 135 - 145 mmol/L   Potassium 3.5 3.5 - 5.1 mmol/L   Chloride 107 98 - 111 mmol/L   CO2 21 (L) 22 - 32 mmol/L   Glucose, Bld 98 70 - 99 mg/dL    Comment: Glucose reference range applies only to samples taken after fasting for at least 8 hours.   BUN 37 (H) 8 - 23 mg/dL   Creatinine 1.61 (H) 0.96 - 1.24 mg/dL   Calcium 8.4 (L) 8.9 - 10.3 mg/dL   Total Protein 5.0 (L) 6.5 - 8.1 g/dL   Albumin 2.9 (L) 3.5 - 5.0 g/dL   AST 15 15 - 41 U/L   ALT 13 0 - 44 U/L   Alkaline Phosphatase 127 (H) 38 - 126 U/L   Total  Bilirubin 0.7 <1.2 mg/dL   GFR, Estimated 26 (L) >60 mL/min    Comment: (NOTE) Calculated using the CKD-EPI Creatinine Equation (2021)    Anion gap 10 5 - 15    Comment: Performed at [facility], [address] [place], [place] [address]  CBC with Differential (Cancer Center Only)     Status: Abnormal   Collection Time: 09/18/23  1:44 PM  Result Value Ref Range   WBC Count 10.0 4.0 - 10.5 K/uL    RBC 2.38 (L) 4.22 - 5.81 MIL/uL   Hemoglobin 7.5 (L) 13.0 - 17.0 g/dL   HCT 75.6 (L) 43.3 - 29.5 %   MCV 100.4 (H) 80.0 - 100.0 fL   MCH 31.5 26.0 - 34.0 pg   MCHC 31.4 30.0 - 36.0 g/dL   RDW 18.8 (H) 41.6 - 60.6 %   Platelet Count 40 (L) 150 - 400 K/uL    Comment: Immature Platelet Fraction may be clinically indicated, consider ordering this additional test [id number]    nRBC 0.0 0.0 - 0.2 %   Neutrophils Relative % 30 %   Neutro Abs 3.0 1.7 - 7.7 K/uL   Lymphocytes Relative 66 %   Lymphs Abs 6.6 (H) 0.7 - 4.0 K/uL   Monocytes Relative 2 %   Monocytes Absolute 0.2 0.1 - 1.0 K/uL   Eosinophils Relative 2 %   Eosinophils Absolute 0.2 0.0 - 0.5 K/uL   Basophils Relative 0 %   Basophils Absolute 0.0 0.0 - 0.1 K/uL   Immature Granulocytes 0 %   Abs Immature Granulocytes 0.02 0.00 - 0.07 K/uL    Comment: Performed at [facility], [address] [place], [place] [address]  Type and screen     Status: None   Collection Time: 09/18/23  1:44 PM  Result Value Ref Range   ABO/RH(D) A POS    Antibody Screen NEG    Sample Expiration 09/21/2023,2359    Unit Number [id number]    Blood Component Type RBC, LR IRR    Unit division 00    Status of Unit ISSUED,FINAL    Transfusion Status OK TO TRANSFUSE    Crossmatch Result      Compatible Performed at [facility], [address] [place], [place] [address]   BPAM RBC     Status: None   Collection Time: 09/18/23  1:44 PM  Result Value Ref Range   ISSUE DATE / TIME [id number]    Blood Product Unit Number [id number]    PRODUCT CODE [id number]    Unit Type and Rh 6200    Blood Product Expiration Date [id number]   Prepare RBC (crossmatch)     Status: None   Collection Time: 09/18/23  4:30 PM  Result Value Ref Range   Order Confirmation      ORDER PROCESSED BY BLOOD BANK Performed at [facility], [address] [place], [place] [address]   CBC with Differential (Cancer Center Only)     Status:  Abnormal   Collection Time: 10/19/23  2:21 PM  Result Value Ref Range   WBC Count 8.4 4.0 - 10.5 K/uL   RBC 2.61 (L) 4.22 - 5.81 MIL/uL   Hemoglobin 8.3 (L) 13.0 - 17.0 g/dL   HCT 01.7 (L) 51.0 - 25.8 %   MCV 101.1 (H) 80.0 - 100.0 fL   MCH 31.8 26.0 - 34.0 pg   MCHC 31.4 30.0 - 36.0 g/dL   RDW 52.7 (H) 78.2 - 42.3 %  Platelet Count 29 (L) 150 - 400 K/uL    Comment: Immature Platelet Fraction may be clinically indicated, consider ordering this additional test [id number]    nRBC 0.0 0.0 - 0.2 %   Neutrophils Relative % 33 %   Neutro Abs 2.8 1.7 - 7.7 K/uL   Lymphocytes Relative 57 %   Lymphs Abs 4.7 (H) 0.7 - 4.0 K/uL   Monocytes Relative 8 %   Monocytes Absolute 0.7 0.1 - 1.0 K/uL   Eosinophils Relative 2 %   Eosinophils Absolute 0.2 0.0 - 0.5 K/uL   Basophils Relative 0 %   Basophils Absolute 0.0 0.0 - 0.1 K/uL   Immature Granulocytes 0 %   Abs Immature Granulocytes 0.01 0.00 - 0.07 K/uL    Comment: Performed at [facility], [address] [place], [place] [address]  Hold Tube- Blood Bank     Status: None   Collection Time: 10/19/23  2:21 PM  Result Value Ref Range   Blood Bank Specimen SAMPLE AVAILABLE FOR TESTING    Sample Expiration      10/22/2023,2359 Performed at [facility], [address] [place], [place] [address]   CMP (Cancer Center only)     Status: Abnormal   Collection Time: 10/19/23  2:21 PM  Result Value Ref Range   Sodium 140 135 - 145 mmol/L   Potassium 3.8 3.5 - 5.1 mmol/L   Chloride 109 98 - 111 mmol/L   CO2 23 22 - 32 mmol/L   Glucose, Bld 139 (H) 70 - 99 mg/dL    Comment: Glucose reference range applies only to samples taken after fasting for at least 8 hours.   BUN 38 (H) 8 - 23 mg/dL   Creatinine 7.82 (H) 9.56 - 1.24 mg/dL   Calcium 8.3 (L) 8.9 - 10.3 mg/dL   Total Protein 5.1 (L) 6.5 - 8.1 g/dL   Albumin 3.2 (L) 3.5 - 5.0 g/dL   AST 15 15 - 41 U/L   ALT 14 0 - 44 U/L   Alkaline Phosphatase 109 38 - 126 U/L   Total  Bilirubin 0.6 0.0 - 1.2 mg/dL   GFR, Estimated 24 (L) >60 mL/min    Comment: (NOTE) Calculated using the CKD-EPI Creatinine Equation (2021)    Anion gap 8 5 - 15    Comment: Performed at [facility], [address] [place], [place] [address]  CBC with Differential (Cancer Center Only)     Status: Abnormal   Collection Time: 11/20/23  1:58 PM  Result Value Ref Range   WBC Count 8.1 4.0 - 10.5 K/uL   RBC 2.36 (L) 4.22 - 5.81 MIL/uL   Hemoglobin 7.6 (L) 13.0 - 17.0 g/dL   HCT 65.7 (L) 84.6 - 96.2 %   MCV 104.2 (H) 80.0 - 100.0 fL   MCH 32.2 26.0 - 34.0 pg   MCHC 30.9 30.0 - 36.0 g/dL   RDW 95.2 (H) 84.1 - 32.4 %   Platelet Count 26 (L) 150 - 400 K/uL    Comment: Immature Platelet Fraction may be clinically indicated, consider ordering this additional test [id number]    nRBC 0.0 0.0 - 0.2 %   Neutrophils Relative % 34 %   Neutro Abs 2.8 1.7 - 7.7 K/uL   Lymphocytes Relative 56 %   Lymphs Abs 4.5 (H) 0.7 - 4.0 K/uL   Monocytes Relative 8 %   Monocytes Absolute 0.7 0.1 - 1.0 K/uL   Eosinophils Relative 2 %   Eosinophils Absolute  0.1 0.0 - 0.5 K/uL   Basophils Relative 0 %   Basophils Absolute 0.0 0.0 - 0.1 K/uL   WBC Morphology SMUDGE CELLS    RBC Morphology UNREMARKABLE    Smear Review Normal platelet morphology     Comment: PLATELETS APPEAR DECREASED   Immature Granulocytes 0 %   Abs Immature Granulocytes 0.02 0.00 - 0.07 K/uL    Comment: Performed at [facility], [address] [place], [place] [address]  Hold Tube- Blood Bank     Status: None   Collection Time: 11/20/23  1:58 PM  Result Value Ref Range   Blood Bank Specimen SAMPLE AVAILABLE FOR TESTING    Sample Expiration      11/23/2023,2359 Performed at [facility], [address] [place], [place] [address]   CMP (Cancer Center only)     Status: Abnormal   Collection Time: 11/20/23  1:58 PM  Result Value Ref Range   Sodium 136 135 - 145 mmol/L   Potassium 4.3 3.5 - 5.1 mmol/L    Chloride 107 98 - 111 mmol/L   CO2 23 22 - 32 mmol/L   Glucose, Bld 161 (H) 70 - 99 mg/dL    Comment: Glucose reference range applies only to samples taken after fasting for at least 8 hours.   BUN 45 (H) 8 - 23 mg/dL   Creatinine 0.98 (H) 1.19 - 1.24 mg/dL   Calcium 7.8 (L) 8.9 - 10.3 mg/dL   Total Protein 4.8 (L) 6.5 - 8.1 g/dL   Albumin 3.0 (L) 3.5 - 5.0 g/dL   AST 15 15 - 41 U/L   ALT 18 0 - 44 U/L   Alkaline Phosphatase 122 38 - 126 U/L   Total Bilirubin 0.8 0.0 - 1.2 mg/dL   GFR, Estimated 24 (L) >60 mL/min    Comment: (NOTE) Calculated using the CKD-EPI Creatinine Equation (2021)    Anion gap 6 5 - 15    Comment: Performed at [facility], [address] [place], [place] [address]    Radiology No results found.  Assessment/Plan  Carotid stenosis Mild disease without obvious focal symptoms.  Should be checked again in the fall.  Abdominal aortic aneurysm (AAA) [facility]) Patient underwent a CT scan of the abdomen and pelvis a week ago at this point.  This was an uninfused scan because of his renal dysfunction.  This has somehow not yet been read by radiology.  Nonetheless, I have independently reviewed the CT scan and he clearly has a stable 5.3 cm infrarenal abdominal aortic aneurysm without progression from previous studies.  We have previously made the clinical decision to defer surgery until he reached at least 5.5 cm due to his litany of other medical issues.  I think that is very reasonable and we will continue this plan.  Due to the size of the aneurysm, we will going to continue to follow this on 3 to 46-month intervals and we have had a harder time seeing it with duplex for a variety of reasons and so uninfused CT scans will be the way we follow this.  Diabetes mellitus (HCC) blood glucose control important in reducing the progression of atherosclerotic disease. Also, involved in wound healing. On appropriate medications.     BP (high blood pressure) blood  pressure control important in reducing the progression of atherosclerotic disease and aneurysmal disease. On appropriate oral medications.   Chronic kidney disease (CKD), stage III (moderate) Hydrate and limit contrast when and if we repair.  HLD (hyperlipidemia) lipid control important in reducing the progression of atherosclerotic disease. Continue statin therapy  Festus Barren, MD  12/12/2023 2:07 PM    This note was created with Dragon medical transcription system.  Any errors from dictation are purely unintentional

## 2023-12-13 ENCOUNTER — Inpatient Hospital Stay (HOSPITAL_BASED_OUTPATIENT_CLINIC_OR_DEPARTMENT_OTHER): Admitting: Oncology

## 2023-12-13 ENCOUNTER — Inpatient Hospital Stay

## 2023-12-13 ENCOUNTER — Ambulatory Visit

## 2023-12-13 ENCOUNTER — Encounter: Payer: Self-pay | Admitting: Oncology

## 2023-12-13 ENCOUNTER — Inpatient Hospital Stay: Attending: Oncology

## 2023-12-13 VITALS — BP 150/53 | HR 58 | Temp 96.7°F | Resp 16 | Ht 67.0 in | Wt 187.0 lb

## 2023-12-13 DIAGNOSIS — D696 Thrombocytopenia, unspecified: Secondary | ICD-10-CM | POA: Insufficient documentation

## 2023-12-13 DIAGNOSIS — Z87891 Personal history of nicotine dependence: Secondary | ICD-10-CM | POA: Insufficient documentation

## 2023-12-13 DIAGNOSIS — N1832 Chronic kidney disease, stage 3b: Secondary | ICD-10-CM | POA: Insufficient documentation

## 2023-12-13 DIAGNOSIS — Z7984 Long term (current) use of oral hypoglycemic drugs: Secondary | ICD-10-CM | POA: Diagnosis not present

## 2023-12-13 DIAGNOSIS — C9110 Chronic lymphocytic leukemia of B-cell type not having achieved remission: Secondary | ICD-10-CM | POA: Diagnosis not present

## 2023-12-13 DIAGNOSIS — D631 Anemia in chronic kidney disease: Secondary | ICD-10-CM | POA: Insufficient documentation

## 2023-12-13 DIAGNOSIS — Z794 Long term (current) use of insulin: Secondary | ICD-10-CM | POA: Insufficient documentation

## 2023-12-13 DIAGNOSIS — E1122 Type 2 diabetes mellitus with diabetic chronic kidney disease: Secondary | ICD-10-CM | POA: Diagnosis not present

## 2023-12-13 DIAGNOSIS — Z79899 Other long term (current) drug therapy: Secondary | ICD-10-CM | POA: Diagnosis not present

## 2023-12-13 DIAGNOSIS — N189 Chronic kidney disease, unspecified: Secondary | ICD-10-CM | POA: Diagnosis not present

## 2023-12-13 DIAGNOSIS — I129 Hypertensive chronic kidney disease with stage 1 through stage 4 chronic kidney disease, or unspecified chronic kidney disease: Secondary | ICD-10-CM | POA: Diagnosis present

## 2023-12-13 LAB — CBC WITH DIFFERENTIAL (CANCER CENTER ONLY)
Abs Immature Granulocytes: 0.01 10*3/uL (ref 0.00–0.07)
Basophils Absolute: 0 10*3/uL (ref 0.0–0.1)
Basophils Relative: 0 %
Eosinophils Absolute: 0.1 10*3/uL (ref 0.0–0.5)
Eosinophils Relative: 1 %
HCT: 20.3 % — ABNORMAL LOW (ref 39.0–52.0)
Hemoglobin: 6.3 g/dL — CL (ref 13.0–17.0)
Immature Granulocytes: 0 %
Lymphocytes Relative: 62 %
Lymphs Abs: 4.1 10*3/uL — ABNORMAL HIGH (ref 0.7–4.0)
MCH: 33.5 pg (ref 26.0–34.0)
MCHC: 31 g/dL (ref 30.0–36.0)
MCV: 108 fL — ABNORMAL HIGH (ref 80.0–100.0)
Monocytes Absolute: 0.6 10*3/uL (ref 0.1–1.0)
Monocytes Relative: 9 %
Neutro Abs: 1.9 10*3/uL (ref 1.7–7.7)
Neutrophils Relative %: 28 %
Platelet Count: 24 10*3/uL — ABNORMAL LOW (ref 150–400)
RBC: 1.88 MIL/uL — ABNORMAL LOW (ref 4.22–5.81)
RDW: 16.2 % — ABNORMAL HIGH (ref 11.5–15.5)
Smear Review: NORMAL
WBC Count: 6.6 10*3/uL (ref 4.0–10.5)
nRBC: 0 % (ref 0.0–0.2)

## 2023-12-13 LAB — CMP (CANCER CENTER ONLY)
ALT: 18 U/L (ref 0–44)
AST: 15 U/L (ref 15–41)
Albumin: 2.8 g/dL — ABNORMAL LOW (ref 3.5–5.0)
Alkaline Phosphatase: 127 U/L — ABNORMAL HIGH (ref 38–126)
Anion gap: 6 (ref 5–15)
BUN: 63 mg/dL — ABNORMAL HIGH (ref 8–23)
CO2: 17 mmol/L — ABNORMAL LOW (ref 22–32)
Calcium: 7.8 mg/dL — ABNORMAL LOW (ref 8.9–10.3)
Chloride: 111 mmol/L (ref 98–111)
Creatinine: 3.13 mg/dL — ABNORMAL HIGH (ref 0.61–1.24)
GFR, Estimated: 19 mL/min — ABNORMAL LOW (ref >60)
Glucose, Bld: 163 mg/dL — ABNORMAL HIGH (ref 70–99)
Potassium: 4.6 mmol/L (ref 3.5–5.1)
Sodium: 134 mmol/L — ABNORMAL LOW (ref 135–145)
Total Bilirubin: 0.7 mg/dL (ref 0.0–1.2)
Total Protein: 4.6 g/dL — ABNORMAL LOW (ref 6.5–8.1)

## 2023-12-13 LAB — PREPARE RBC (CROSSMATCH)

## 2023-12-13 MED ORDER — EPOETIN ALFA-EPBX 40000 UNIT/ML IJ SOLN
40000.0000 [IU] | Freq: Once | INTRAMUSCULAR | Status: DC
Start: 2023-12-13 — End: 2023-12-13
  Filled 2023-12-13: qty 1

## 2023-12-13 NOTE — Progress Notes (Signed)
 [facility]  [phone] [phone]  ID: Patrick Catholic Schenk Jr. OB: [birth year]  MR#: [id number]  AOZ#:[id number]  Patient Care Team: Marguarite Arbour, MD as PCP - General (Internal Medicine) Jeralyn Ruths, MD as Consulting Physician (Oncology)  CHIEF COMPLAINT: CLL, anemia secondary to chronic renal insufficiency, thrombocytopenia.  INTERVAL HISTORY: Patient returns to clinic today as an add-on with complaints of increasing fatigue.  He has new bruising on his foot and forearm, but otherwise feels well.  He denies any fevers, night sweats, or weight loss. He has noted no new lymphadenopathy.  He has no neurologic complaints today.  He denies any chest pain, shortness of breath, cough, or hemoptysis.  He denies any nausea, vomiting, constipation, or diarrhea. He has no urinary complaints.  Patient offers no further specific complaints today.    REVIEW OF SYSTEMS:   Review of Systems  Constitutional:  Positive for malaise/fatigue. Negative for diaphoresis, fever and weight loss.  Respiratory: Negative.  Negative for cough and shortness of breath.   Cardiovascular: Negative.  Negative for chest pain and leg swelling.  Gastrointestinal: Negative.  Negative for abdominal pain, blood in stool and melena.  Genitourinary: Negative.  Negative for dysuria.  Musculoskeletal: Negative.  Negative for back pain.  Skin: Negative.  Negative for rash.  Neurological:  Positive for weakness. Negative for dizziness, sensory change, focal weakness and headaches.  Endo/Heme/Allergies:  Bruises/bleeds easily.  Psychiatric/Behavioral: Negative.  The patient is not nervous/anxious.     As per HPI. Otherwise, a complete review of systems is negative.  PAST MEDICAL HISTORY: Past Medical History:  Diagnosis Date   Anxiety    CKD (chronic kidney disease), stage III (HCC)    CLL (chronic lymphocytic leukemia) (HCC)    Diabetes mellitus without complication (HCC)     Diverticulitis 04/09/2017   History of kidney stones    HLD (hyperlipidemia)    HTN (hypertension)    PKD (polycystic kidney disease)    Sleep apnea    Uncontrolled type 2 diabetes mellitus with hyperglycemia (HCC) 06/28/2018    PAST SURGICAL HISTORY: Past Surgical History:  Procedure Laterality Date   CARDIAC CATHETERIZATION  12/1987   CENTRAL LINE INSERTION N/A 04/11/2017   Procedure: Central Line Insertion;  Surgeon: Renford Dills, MD;  Location: [facility];  Service: Cardiovascular;  Laterality: N/A;   CERVICAL FUSION     CHOLECYSTECTOMY     CYSTOSCOPY W/ RETROGRADES Left 05/12/2017   Procedure: CYSTOSCOPY WITH RETROGRADE PYELOGRAM;  Surgeon: Hildred Laser, MD;  Location: [facility];  Service: Urology;  Laterality: Left;   CYSTOSCOPY W/ URETERAL STENT PLACEMENT Left 05/12/2017   Procedure: CYSTOSCOPY WITH STENT REMOVAL;  Surgeon: Hildred Laser, MD;  Location: [facility];  Service: Urology;  Laterality: Left;   CYSTOSCOPY WITH STENT PLACEMENT Left 04/11/2017   Procedure: CYSTOSCOPY WITH STENT PLACEMENT;  Surgeon: Jerilee Field, MD;  Location: [facility];  Service: Urology;  Laterality: Left;   URETEROSCOPY  05/12/2017   Procedure: URETEROSCOPY;  Surgeon: Hildred Laser, MD;  Location: [facility];  Service: Urology;;    FAMILY HISTORY: Reported history of ovarian and lung cancer. Diabetes, hypertension.     ADVANCED DIRECTIVES:    HEALTH MAINTENANCE: Social History   Tobacco Use   Smoking status: Former    Passive exposure: Past   Smokeless tobacco: Never   Tobacco comments:    quit in Feb of 1997  Substance Use Topics   Alcohol use: No   Drug use:  No     Colonoscopy:  PAP:  Bone density:  Lipid panel:  Allergies  Allergen Reactions   Celecoxib Other (See Comments)    Increased Blood Pressure   Chlorpromazine Nausea Only   Iodinated Contrast Media Other (See Comments)    Stage 3 kidney disease, told IV dye would be bad for him    Prednisone Other (See Comments)    Diabetic   Amoxicillin-Pot Clavulanate Rash    Has patient had a PCN reaction causing immediate rash, facial/tongue/throat swelling, SOB or lightheadedness with hypotension: No Has patient had a PCN reaction causing severe rash involving mucus membranes or skin necrosis: No Has patient had a PCN reaction that required hospitalization: No Has patient had a PCN reaction occurring within the last 10 years: Yes If all of the above answers are "NO", then may proceed with Cephalosporin use.    Penicillin V Potassium Rash    Current Outpatient Medications  Medication Sig Dispense Refill   Aflibercept 2 MG/0.05ML SOLN Inject 1 Dose into the eye as directed. One injection into left eye every 8-9 weeks     allopurinol (ZYLOPRIM) 100 MG tablet Take 100 mg by mouth daily.     atorvastatin (LIPITOR) 10 MG tablet Take 10 mg by mouth at bedtime.      BD PEN NEEDLE NANO 2ND GEN 32G X 4 MM MISC USE AS DIRECTED ONCE A DAY     carvedilol (COREG) 12.5 MG tablet Take 12.5 mg by mouth 2 (two) times daily with a meal.     Cholecalciferol (VITAMIN D-3) 5000 units TABS Take 5,000 Units by mouth at bedtime.      clonazePAM (KLONOPIN) 1 MG tablet Take 1 mg by mouth at bedtime.      CONTOUR NEXT TEST test strip daily.     cyanocobalamin 500 MCG tablet Take 1,000 mcg by mouth at bedtime.      empagliflozin (JARDIANCE) 10 MG TABS tablet 10 mg daily.     epoetin alfa-epbx (RETACRIT) 16109 UNIT/ML injection Inject 40,000 Units into the skin every 6 (six) weeks. Depending on lab results (if needed)     fexofenadine (ALLEGRA) 180 MG tablet Take 180 mg by mouth daily as needed for allergies.     finasteride (PROSCAR) 5 MG tablet Take 1 tablet (5 mg total) by mouth daily. 90 tablet 3   folic acid (FOLVITE) 400 MCG tablet Take 400 mcg by mouth daily.     insulin glargine (LANTUS) 100 UNIT/ML injection Inject 20-30 Units into the skin every morning. Takes Lantus 20 units if CBG less than 120  mg/dl or Lantus 30 units if CBG is over 120 mg/dl     Multiple Vitamins-Minerals (PRESERVISION AREDS 2 PO) Take 1 capsule by mouth 2 (two) times daily.      omega-3 acid ethyl esters (LOVAZA) 1 g capsule Take by mouth 2 (two) times daily.     prednisoLONE acetate (PRED FORTE) 1 % ophthalmic suspension Place 1 drop into the right eye 4 (four) times daily.     sertraline (ZOLOFT) 50 MG tablet Take 100 mg by mouth daily.     tamsulosin (FLOMAX) 0.4 MG CAPS capsule TAKE 1 CAPSULE(0.4 MG) BY MOUTH DAILY AFTER SUPPER 90 capsule 3   telmisartan (MICARDIS) 40 MG tablet Take 40 mg by mouth daily.     torsemide (DEMADEX) 10 MG tablet Take by mouth.     triamcinolone (NASACORT) 55 MCG/ACT AERO nasal inhaler Place 2 sprays into the nose 2 (two) times daily  as needed (allergies).      No current facility-administered medications for this visit.   Facility-Administered Medications Ordered in Other Visits  Medication Dose Route Frequency Provider Last Rate Last Admin   epoetin alfa-epbx (RETACRIT) injection 40,000 Units  40,000 Units Subcutaneous Once Jeralyn Ruths, MD       epoetin alfa-epbx (RETACRIT) injection 40,000 Units  40,000 Units Subcutaneous Once Jeralyn Ruths, MD        OBJECTIVE: Vitals:   12/13/23 1432  BP: (!) 150/53  Pulse: (!) 58  Resp: 16  Temp: (!) 96.7 F (35.9 C)  SpO2: 100%      Body mass index is 29.29 kg/m.    ECOG FS:1 - Symptomatic but completely ambulatory  General: Well-developed, well-nourished, no acute distress. Eyes: Pink conjunctiva, anicteric sclera. HEENT: Normocephalic, moist mucous membranes. Lungs: No audible wheezing or coughing. Heart: Regular rate and rhythm. Abdomen: Soft, nontender, no obvious distention. Musculoskeletal: No edema, cyanosis, or clubbing. Neuro: Alert, answering all questions appropriately. Cranial nerves grossly intact. Skin: No rashes or petechiae noted. Psych: Normal affect.  LAB RESULTS:  Lab Results  Component  Value Date   NA 134 (L) 12/13/2023   K 4.6 12/13/2023   CL 111 12/13/2023   CO2 17 (L) 12/13/2023   GLUCOSE 163 (H) 12/13/2023   BUN 63 (H) 12/13/2023   CREATININE 3.13 (H) 12/13/2023   CALCIUM 7.8 (L) 12/13/2023   PROT 4.6 (L) 12/13/2023   ALBUMIN 2.8 (L) 12/13/2023   AST 15 12/13/2023   ALT 18 12/13/2023   ALKPHOS 127 (H) 12/13/2023   BILITOT 0.7 12/13/2023   GFRNONAA 19 (L) 12/13/2023   GFRAA 37 (L) 11/19/2019    Lab Results  Component Value Date   WBC 6.6 12/13/2023   NEUTROABS 1.9 12/13/2023   HGB 6.3 (LL) 12/13/2023   HCT 20.3 (L) 12/13/2023   MCV 108.0 (H) 12/13/2023   PLT 24 (L) 12/13/2023   Lab Results  Component Value Date   IRON 67 12/15/2022   TIBC 251 12/15/2022   IRONPCTSAT 27 12/15/2022   Lab Results  Component Value Date   FERRITIN 31 08/25/2021     STUDIES: No results found.  ASSESSMENT: Rai stage I CLL, anemia secondary to chronic renal insufficiency.  PLAN:    Anemia secondary to chronic renal insufficiency: Patient's hemoglobin is significantly worse today at 6.3.  He denies any blood loss.  Proceed with Retacrit today and return to clinic tomorrow for 1 unit packed red blood cells.  If patient's hemoglobin remains persistently decreased, will consider bone marrow biopsy for further evaluation.  Return to clinic in 2 weeks with repeat laboratory work, further evaluation, and consideration of additional blood if needed.   CLL: Chronic and unchanged.  Patient's total white blood cell count has ranged between 6.6 and 24.8 since May 2013.  6.6.  No intervention is needed. Continue simple observation. Thrombocytopenia: Patient required multiple transfusions of platelets prior to his oral surgery.  He did not have any excessive bleeding during his surgery.  Patient platelet count remains decreased to 24.  His count was as high as 95 in June 2016.  But more recently between has ranged from 32-63 since June 2019.  Hesitant to use steroids given his  underlying diabetes and is unclear if Rituxan will be effective.  He does not require transfusion today.  Return to clinic as scheduled.   Lymphadenopathy: CT scan results from April 09, 2017 reviewed independently with enlarged pelvic and retroperitoneal lymph nodes. These  were also noted on a scan in 2012 appear to be slightly larger. No intervention is needed. No further imaging is necessary unless there is suspicion of progression of disease.  Aortic aneurysm: Appreciate vascular surgery input.  Patient elected to hold off any procedures until his aneurysm reaches 5.5 cm. Dental surgery: Successful and resolved.  I spent a total of 30 minutes reviewing chart data, face-to-face evaluation with the patient, counseling and coordination of care as detailed above.   Patient expressed understanding and was in agreement with this plan. He also understands that He can call clinic at any time with any questions, concerns, or complaints.   Jeralyn Ruths, MD   12/13/2023 4:08 PM

## 2023-12-13 NOTE — Progress Notes (Signed)
 Has some bruising on right ankle that he would like checked out.

## 2023-12-14 ENCOUNTER — Inpatient Hospital Stay

## 2023-12-14 DIAGNOSIS — D631 Anemia in chronic kidney disease: Secondary | ICD-10-CM

## 2023-12-14 DIAGNOSIS — I129 Hypertensive chronic kidney disease with stage 1 through stage 4 chronic kidney disease, or unspecified chronic kidney disease: Secondary | ICD-10-CM | POA: Diagnosis not present

## 2023-12-14 MED ORDER — ACETAMINOPHEN 325 MG PO TABS
650.0000 mg | ORAL_TABLET | Freq: Once | ORAL | Status: AC
  Administered 2023-12-14: 650 mg via ORAL
  Filled 2023-12-14: qty 2

## 2023-12-14 MED ORDER — DIPHENHYDRAMINE HCL 50 MG/ML IJ SOLN
25.0000 mg | Freq: Once | INTRAMUSCULAR | Status: AC
Start: 2023-12-14 — End: 2023-12-14
  Administered 2023-12-14: 25 mg via INTRAVENOUS
  Filled 2023-12-14: qty 1

## 2023-12-14 NOTE — Patient Instructions (Addendum)

## 2023-12-15 LAB — TYPE AND SCREEN
ABO/RH(D): A POS
Antibody Screen: NEGATIVE
Unit division: 0

## 2023-12-15 LAB — BPAM RBC
Blood Product Expiration Date: 202503112359
ISSUE DATE / TIME: 202503060926
Unit Type and Rh: 9500

## 2023-12-18 ENCOUNTER — Other Ambulatory Visit: Payer: Medicare Other

## 2023-12-18 ENCOUNTER — Ambulatory Visit: Payer: Medicare Other

## 2023-12-22 DIAGNOSIS — M25571 Pain in right ankle and joints of right foot: Secondary | ICD-10-CM | POA: Insufficient documentation

## 2023-12-26 ENCOUNTER — Other Ambulatory Visit: Payer: Self-pay | Admitting: *Deleted

## 2023-12-26 DIAGNOSIS — C9110 Chronic lymphocytic leukemia of B-cell type not having achieved remission: Secondary | ICD-10-CM

## 2023-12-26 DIAGNOSIS — D631 Anemia in chronic kidney disease: Secondary | ICD-10-CM

## 2023-12-26 DIAGNOSIS — D696 Thrombocytopenia, unspecified: Secondary | ICD-10-CM

## 2023-12-27 ENCOUNTER — Other Ambulatory Visit: Payer: Self-pay

## 2023-12-27 ENCOUNTER — Encounter: Payer: Self-pay | Admitting: Oncology

## 2023-12-27 ENCOUNTER — Telehealth: Payer: Self-pay

## 2023-12-27 ENCOUNTER — Inpatient Hospital Stay

## 2023-12-27 ENCOUNTER — Inpatient Hospital Stay (HOSPITAL_BASED_OUTPATIENT_CLINIC_OR_DEPARTMENT_OTHER): Admitting: Oncology

## 2023-12-27 VITALS — BP 140/50 | HR 70 | Temp 97.1°F | Resp 16 | Wt 187.0 lb

## 2023-12-27 DIAGNOSIS — D696 Thrombocytopenia, unspecified: Secondary | ICD-10-CM

## 2023-12-27 DIAGNOSIS — D631 Anemia in chronic kidney disease: Secondary | ICD-10-CM

## 2023-12-27 DIAGNOSIS — N189 Chronic kidney disease, unspecified: Secondary | ICD-10-CM | POA: Diagnosis not present

## 2023-12-27 DIAGNOSIS — C9110 Chronic lymphocytic leukemia of B-cell type not having achieved remission: Secondary | ICD-10-CM

## 2023-12-27 DIAGNOSIS — I129 Hypertensive chronic kidney disease with stage 1 through stage 4 chronic kidney disease, or unspecified chronic kidney disease: Secondary | ICD-10-CM | POA: Diagnosis not present

## 2023-12-27 LAB — CMP (CANCER CENTER ONLY)
ALT: 15 U/L (ref 0–44)
AST: 13 U/L — ABNORMAL LOW (ref 15–41)
Albumin: 2.8 g/dL — ABNORMAL LOW (ref 3.5–5.0)
Alkaline Phosphatase: 115 U/L (ref 38–126)
Anion gap: 4 — ABNORMAL LOW (ref 5–15)
BUN: 51 mg/dL — ABNORMAL HIGH (ref 8–23)
CO2: 19 mmol/L — ABNORMAL LOW (ref 22–32)
Calcium: 7.3 mg/dL — ABNORMAL LOW (ref 8.9–10.3)
Chloride: 112 mmol/L — ABNORMAL HIGH (ref 98–111)
Creatinine: 2.98 mg/dL — ABNORMAL HIGH (ref 0.61–1.24)
GFR, Estimated: 20 mL/min — ABNORMAL LOW (ref >60)
Glucose, Bld: 177 mg/dL — ABNORMAL HIGH (ref 70–99)
Potassium: 4.3 mmol/L (ref 3.5–5.1)
Sodium: 135 mmol/L (ref 135–145)
Total Bilirubin: 0.6 mg/dL (ref 0.0–1.2)
Total Protein: 4.6 g/dL — ABNORMAL LOW (ref 6.5–8.1)

## 2023-12-27 LAB — PREPARE RBC (CROSSMATCH)

## 2023-12-27 LAB — CBC WITH DIFFERENTIAL/PLATELET
Abs Immature Granulocytes: 0.01 10*3/uL (ref 0.00–0.07)
Basophils Absolute: 0 10*3/uL (ref 0.0–0.1)
Basophils Relative: 0 %
Eosinophils Absolute: 0.1 10*3/uL (ref 0.0–0.5)
Eosinophils Relative: 2 %
HCT: 22.8 % — ABNORMAL LOW (ref 39.0–52.0)
Hemoglobin: 6.9 g/dL — CL (ref 13.0–17.0)
Immature Granulocytes: 0 %
Lymphocytes Relative: 63 %
Lymphs Abs: 3.5 10*3/uL (ref 0.7–4.0)
MCH: 32.9 pg (ref 26.0–34.0)
MCHC: 30.3 g/dL (ref 30.0–36.0)
MCV: 108.6 fL — ABNORMAL HIGH (ref 80.0–100.0)
Monocytes Absolute: 0.3 10*3/uL (ref 0.1–1.0)
Monocytes Relative: 4 %
Neutro Abs: 1.8 10*3/uL (ref 1.7–7.7)
Neutrophils Relative %: 31 %
Platelets: 21 10*3/uL — ABNORMAL LOW (ref 150–400)
RBC: 2.1 MIL/uL — ABNORMAL LOW (ref 4.22–5.81)
RDW: 18 % — ABNORMAL HIGH (ref 11.5–15.5)
WBC: 5.7 10*3/uL (ref 4.0–10.5)
nRBC: 0 % (ref 0.0–0.2)

## 2023-12-27 NOTE — Progress Notes (Signed)
 Starting to feel more fatigued recently, his appetite is getting better. He just wants to see how his labs look today. No new questions for the doctor today.

## 2023-12-27 NOTE — Progress Notes (Signed)
 [facility]  [phone] [phone]  ID: Patrick Catholic Loughmiller Jr. OB: [birth year]  MR#: [id number]  HCW#:[id number]  Patient Care Team: Marguarite Arbour, MD as PCP - General (Internal Medicine) Jeralyn Ruths, MD as Consulting Physician (Oncology)  CHIEF COMPLAINT: CLL, anemia secondary to chronic renal insufficiency, thrombocytopenia.  INTERVAL HISTORY: Patient returns to clinic today for repeat laboratory work, further evaluation, consideration of additional blood.  He feels mildly improved since receiving 1 unit of blood 2 weeks ago, but still has significant weakness and fatigue.  He continues to have easy bruising as well.  He denies any fevers, night sweats, or weight loss. He has noted no new lymphadenopathy.  He has no neurologic complaints today.  He denies any chest pain, shortness of breath, cough, or hemoptysis.  He denies any nausea, vomiting, constipation, or diarrhea. He has no urinary complaints.  Patient offers no further specific complaints today.  REVIEW OF SYSTEMS:   Review of Systems  Constitutional:  Positive for malaise/fatigue. Negative for diaphoresis, fever and weight loss.  Respiratory: Negative.  Negative for cough and shortness of breath.   Cardiovascular: Negative.  Negative for chest pain and leg swelling.  Gastrointestinal: Negative.  Negative for abdominal pain, blood in stool and melena.  Genitourinary: Negative.  Negative for dysuria.  Musculoskeletal: Negative.  Negative for back pain.  Skin: Negative.  Negative for rash.  Neurological:  Positive for weakness. Negative for dizziness, sensory change, focal weakness and headaches.  Endo/Heme/Allergies:  Bruises/bleeds easily.  Psychiatric/Behavioral: Negative.  The patient is not nervous/anxious.     As per HPI. Otherwise, a complete review of systems is negative.  PAST MEDICAL HISTORY: Past Medical History:  Diagnosis Date   Anxiety    CKD (chronic kidney  disease), stage III (HCC)    CLL (chronic lymphocytic leukemia) (HCC)    Diabetes mellitus without complication (HCC)    Diverticulitis 04/09/2017   History of kidney stones    HLD (hyperlipidemia)    HTN (hypertension)    PKD (polycystic kidney disease)    Sleep apnea    Uncontrolled type 2 diabetes mellitus with hyperglycemia (HCC) 06/28/2018    PAST SURGICAL HISTORY: Past Surgical History:  Procedure Laterality Date   CARDIAC CATHETERIZATION  12/1987   CENTRAL LINE INSERTION N/A 04/11/2017   Procedure: Central Line Insertion;  Surgeon: Renford Dills, MD;  Location: [facility];  Service: Cardiovascular;  Laterality: N/A;   CERVICAL FUSION     CHOLECYSTECTOMY     CYSTOSCOPY W/ RETROGRADES Left 05/12/2017   Procedure: CYSTOSCOPY WITH RETROGRADE PYELOGRAM;  Surgeon: Hildred Laser, MD;  Location: [facility];  Service: Urology;  Laterality: Left;   CYSTOSCOPY W/ URETERAL STENT PLACEMENT Left 05/12/2017   Procedure: CYSTOSCOPY WITH STENT REMOVAL;  Surgeon: Hildred Laser, MD;  Location: [facility];  Service: Urology;  Laterality: Left;   CYSTOSCOPY WITH STENT PLACEMENT Left 04/11/2017   Procedure: CYSTOSCOPY WITH STENT PLACEMENT;  Surgeon: Jerilee Field, MD;  Location: [facility];  Service: Urology;  Laterality: Left;   URETEROSCOPY  05/12/2017   Procedure: URETEROSCOPY;  Surgeon: Hildred Laser, MD;  Location: [facility];  Service: Urology;;    FAMILY HISTORY: Reported history of ovarian and lung cancer. Diabetes, hypertension.     ADVANCED DIRECTIVES:    HEALTH MAINTENANCE: Social History   Tobacco Use   Smoking status: Former    Passive exposure: Past   Smokeless tobacco: Never   Tobacco comments:    quit in  Feb of 1997  Substance Use Topics   Alcohol use: No   Drug use: No     Colonoscopy:  PAP:  Bone density:  Lipid panel:  Allergies  Allergen Reactions   Celecoxib Other (See Comments)    Increased Blood Pressure   Chlorpromazine Nausea  Only   Iodinated Contrast Media Other (See Comments)    Stage 3 kidney disease, told IV dye would be bad for him   Prednisone Other (See Comments)    Diabetic   Amoxicillin-Pot Clavulanate Rash    Has patient had a PCN reaction causing immediate rash, facial/tongue/throat swelling, SOB or lightheadedness with hypotension: No Has patient had a PCN reaction causing severe rash involving mucus membranes or skin necrosis: No Has patient had a PCN reaction that required hospitalization: No Has patient had a PCN reaction occurring within the last 10 years: Yes If all of the above answers are "NO", then may proceed with Cephalosporin use.    Penicillin V Potassium Rash    Current Outpatient Medications  Medication Sig Dispense Refill   Aflibercept 2 MG/0.05ML SOLN Inject 1 Dose into the eye as directed. One injection into left eye every 8-9 weeks     allopurinol (ZYLOPRIM) 100 MG tablet Take 100 mg by mouth daily.     atorvastatin (LIPITOR) 10 MG tablet Take 10 mg by mouth at bedtime.      BD PEN NEEDLE NANO 2ND GEN 32G X 4 MM MISC USE AS DIRECTED ONCE A DAY     carvedilol (COREG) 12.5 MG tablet Take 12.5 mg by mouth 2 (two) times daily with a meal.     Cholecalciferol (VITAMIN D-3) 5000 units TABS Take 5,000 Units by mouth at bedtime.      clonazePAM (KLONOPIN) 1 MG tablet Take 1 mg by mouth at bedtime.      CONTOUR NEXT TEST test strip daily.     cyanocobalamin 500 MCG tablet Take 1,000 mcg by mouth at bedtime.      empagliflozin (JARDIANCE) 10 MG TABS tablet 10 mg daily.     epoetin alfa-epbx (RETACRIT) 45409 UNIT/ML injection Inject 40,000 Units into the skin every 6 (six) weeks. Depending on lab results (if needed)     fexofenadine (ALLEGRA) 180 MG tablet Take 180 mg by mouth daily as needed for allergies.     finasteride (PROSCAR) 5 MG tablet Take 1 tablet (5 mg total) by mouth daily. 90 tablet 3   folic acid (FOLVITE) 400 MCG tablet Take 400 mcg by mouth daily.     insulin glargine  (LANTUS) 100 UNIT/ML injection Inject 20-30 Units into the skin every morning. Takes Lantus 20 units if CBG less than 120 mg/dl or Lantus 30 units if CBG is over 120 mg/dl     Multiple Vitamins-Minerals (PRESERVISION AREDS 2 PO) Take 1 capsule by mouth 2 (two) times daily.      omega-3 acid ethyl esters (LOVAZA) 1 g capsule Take by mouth 2 (two) times daily.     prednisoLONE acetate (PRED FORTE) 1 % ophthalmic suspension Place 1 drop into the right eye 4 (four) times daily.     sertraline (ZOLOFT) 50 MG tablet Take 100 mg by mouth daily.     tamsulosin (FLOMAX) 0.4 MG CAPS capsule TAKE 1 CAPSULE(0.4 MG) BY MOUTH DAILY AFTER SUPPER 90 capsule 3   telmisartan (MICARDIS) 40 MG tablet Take 40 mg by mouth daily.     torsemide (DEMADEX) 10 MG tablet Take by mouth.     triamcinolone (  NASACORT) 55 MCG/ACT AERO nasal inhaler Place 2 sprays into the nose 2 (two) times daily as needed (allergies).      No current facility-administered medications for this visit.   Facility-Administered Medications Ordered in Other Visits  Medication Dose Route Frequency Provider Last Rate Last Admin   epoetin alfa-epbx (RETACRIT) injection 40,000 Units  40,000 Units Subcutaneous Once Jeralyn Ruths, MD        OBJECTIVE: Vitals:   12/27/23 1352  BP: (!) 140/50  Pulse: 70  Resp: 16  Temp: (!) 97.1 F (36.2 C)  SpO2: 100%       Body mass index is 29.29 kg/m.    ECOG FS:1 - Symptomatic but completely ambulatory  General: Well-developed, well-nourished, no acute distress. Eyes: Pink conjunctiva, anicteric sclera. HEENT: Normocephalic, moist mucous membranes. Lungs: No audible wheezing or coughing. Heart: Regular rate and rhythm. Abdomen: Soft, nontender, no obvious distention. Musculoskeletal: No edema, cyanosis, or clubbing. Neuro: Alert, answering all questions appropriately. Cranial nerves grossly intact. Skin: No rashes or petechiae noted.  Multiple ecchymosis noted on upper extremity. Psych: Normal  affect.  LAB RESULTS:  Lab Results  Component Value Date   NA 135 12/27/2023   K 4.3 12/27/2023   CL 112 (H) 12/27/2023   CO2 19 (L) 12/27/2023   GLUCOSE 177 (H) 12/27/2023   BUN 51 (H) 12/27/2023   CREATININE 2.98 (H) 12/27/2023   CALCIUM 7.3 (L) 12/27/2023   PROT 4.6 (L) 12/27/2023   ALBUMIN 2.8 (L) 12/27/2023   AST 13 (L) 12/27/2023   ALT 15 12/27/2023   ALKPHOS 115 12/27/2023   BILITOT 0.6 12/27/2023   GFRNONAA 20 (L) 12/27/2023   GFRAA 37 (L) 11/19/2019    Lab Results  Component Value Date   WBC 5.7 12/27/2023   NEUTROABS 1.8 12/27/2023   HGB 6.9 (LL) 12/27/2023   HCT 22.8 (L) 12/27/2023   MCV 108.6 (H) 12/27/2023   PLT 21 (L) 12/27/2023   Lab Results  Component Value Date   IRON 67 12/15/2022   TIBC 251 12/15/2022   IRONPCTSAT 27 12/15/2022   Lab Results  Component Value Date   FERRITIN 31 08/25/2021     STUDIES: CT ABDOMEN PELVIS WO CONTRAST Result Date: 12/26/2023 CLINICAL DATA:  Abdominal aortic aneurism follow-up EXAM: CT ABDOMEN AND PELVIS WITHOUT CONTRAST TECHNIQUE: Multidetector CT imaging of the abdomen and pelvis was performed following the standard protocol without IV contrast. RADIATION DOSE REDUCTION: This exam was performed according to the departmental dose-optimization program which includes automated exposure control, adjustment of the mA and/or kV according to patient size and/or use of iterative reconstruction technique. COMPARISON:  CT abdomen and pelvis August 25, 2023 FINDINGS: Lower chest: No acute abnormality. Hepatobiliary: There is mild increased density of the liver. There is no focal hepatic parenchymal lesions. There is a small amount of ascites now seen surrounding the liver compared with prior examination. The spleen appears slightly enlarged at 15 cm similar to prior examination spleen also measures 15 cm in longitudinal diameter and 12 in transverse diameter. Findings could reflect of mild hepatocellular disease such as cirrhosis  with mild splenomegaly and post fall mild portal hypertension minimal ascites. Unremarkable without masses. The left adrenal gland is normal. The right adrenal gland is normal. The left kidney is normal. With the cortical cyst in the lateral cortical region of the lower pole distribution measuring 1.8 cm, unchanged since prior examination. The right kidney is unchanged since prior examination. Replaced by numerous cysts the kidney measures again up to  25 cm in length on sagittal images Comparison with prior examination again is noted the infrarenal abdominal aortic aneurysms, fusiform in appearance, measuring 5.1 cm in maximum AP diameter and 4.9 cm in maximum transverse diameter with a length of about 7 cm including just above the aortic bifurcation. No retroperitoneal masses or adenopathy There is diffuse diverticulosis of the sigmoid and descending colon without evidence of diverticulitis. Small amount of fluid in the cul-de-sac of the pelvis correlate with the previously described ascites. No retroperitoneal masses or adenopathy. There is diffuse stranding of the anterior abdominal wall subcutaneous tissues similar to prior examination from 2024 with no change in the small supraumbilical epigastric right paramidline hernia defect containing fat only. Degenerative disc disease L4-5 and L5-S1. IMPRESSION: *Stable infrarenal abdominal aortic aneurysm measuring 5.1 cm in maximum AP diameter and 4.9 cm in maximum transverse diameter with a length of about 7 cm including just above the aortic bifurcation. *Mild increased density of the liver with mild splenomegaly and mild ascites. Findings could reflect mild hepatocellular disease such as cirrhosis with mild splenomegaly and mild portal hypertension. *Diverticulosis of the sigmoid and descending colon without evidence of diverticulitis. *Stable diffuse stranding of the anterior abdominal wall subcutaneous tissues with no change in the small supraumbilical epigastric  right paramidline hernia defect containing fat only. *Stable left renal cyst. *Stable right renal cyst. *Stable degenerative disc disease L4-5 and L5-S1. *Follow-up CT should be low-dose protocol and included thin (1 mm) images. Electronically Signed   By: Shaaron Adler M.D.   On: 12/26/2023 19:49    ASSESSMENT: Rai stage I CLL, anemia secondary to chronic renal insufficiency.  PLAN:    Anemia secondary to chronic renal insufficiency: Despite 1 unit of packed red blood cells approximately 2 weeks ago, patient's hemoglobin only improved to 6.9.  He continues to deny any blood loss.  Return to clinic tomorrow for 1 unit of packed red blood cells.  Patient would then return to clinic in 1 week with repeat laboratory work and further evaluation.  If patient's hemoglobin remains persistently decreased, will consider bone marrow biopsy for further evaluation. CLL: Chronic and unchanged.  Patient's total white blood count is within normal limits today.  No intervention is needed. Continue simple observation. Thrombocytopenia: Patient's platelet count has trended down slightly to 21.  Previously, patient required multiple transfusions of platelets prior to his oral surgery.  He did not have any excessive bleeding during his surgery.  His count was as high as 95 in June 2016.  But more recently between has ranged from 32-63 since June 2019.  Hesitant to use steroids given his underlying diabetes and is unclear if Rituxan will be effective.  He does not require transfusion today.  Return to clinic as scheduled.  Consider bone marrow biopsy as above. Lymphadenopathy: CT scan result from December 26, 2023 reviewed independently and report as above with no obvious lymphadenopathy. Aortic aneurysm: Appreciate vascular surgery input.  Patient elected to hold off any procedures until his aneurysm reaches 5.5 cm. Dental surgery: Successful and resolved.   Patient expressed understanding and was in agreement with this  plan. He also understands that He can call clinic at any time with any questions, concerns, or complaints.   Jeralyn Ruths, MD   12/27/2023 2:50 PM

## 2023-12-27 NOTE — Telephone Encounter (Signed)
 Recevied call with Critical lab result from [facility] lab   Hemoglobin 6.9.   Dr. Orlie Dakin notified.

## 2023-12-28 ENCOUNTER — Inpatient Hospital Stay

## 2023-12-28 DIAGNOSIS — I129 Hypertensive chronic kidney disease with stage 1 through stage 4 chronic kidney disease, or unspecified chronic kidney disease: Secondary | ICD-10-CM | POA: Diagnosis not present

## 2023-12-28 DIAGNOSIS — D631 Anemia in chronic kidney disease: Secondary | ICD-10-CM

## 2023-12-28 MED ORDER — SODIUM CHLORIDE 0.9% IV SOLUTION
250.0000 mL | INTRAVENOUS | Status: DC
  Administered 2023-12-28: 100 mL via INTRAVENOUS
  Filled 2023-12-28: qty 250

## 2023-12-28 MED ORDER — ACETAMINOPHEN 325 MG PO TABS
650.0000 mg | ORAL_TABLET | Freq: Once | ORAL | Status: AC
  Administered 2023-12-28: 650 mg via ORAL
  Filled 2023-12-28: qty 2

## 2023-12-28 MED ORDER — DIPHENHYDRAMINE HCL 50 MG/ML IJ SOLN
25.0000 mg | Freq: Once | INTRAMUSCULAR | Status: AC
Start: 2023-12-28 — End: 2023-12-28
  Administered 2023-12-28: 25 mg via INTRAVENOUS
  Filled 2023-12-28: qty 1

## 2023-12-29 LAB — TYPE AND SCREEN
ABO/RH(D): A POS
Antibody Screen: NEGATIVE
Unit division: 0

## 2023-12-29 LAB — BPAM RBC
Blood Product Expiration Date: 202503302359
ISSUE DATE / TIME: 202503200932
Unit Type and Rh: 202503302359
Unit Type and Rh: 600

## 2024-01-04 ENCOUNTER — Inpatient Hospital Stay

## 2024-01-04 ENCOUNTER — Other Ambulatory Visit: Payer: Self-pay | Admitting: *Deleted

## 2024-01-04 ENCOUNTER — Inpatient Hospital Stay (HOSPITAL_BASED_OUTPATIENT_CLINIC_OR_DEPARTMENT_OTHER): Admitting: Oncology

## 2024-01-04 ENCOUNTER — Encounter: Payer: Self-pay | Admitting: Oncology

## 2024-01-04 VITALS — BP 141/57 | HR 56 | Temp 98.6°F | Resp 16 | Ht 67.0 in | Wt 188.0 lb

## 2024-01-04 DIAGNOSIS — C9110 Chronic lymphocytic leukemia of B-cell type not having achieved remission: Secondary | ICD-10-CM

## 2024-01-04 DIAGNOSIS — D631 Anemia in chronic kidney disease: Secondary | ICD-10-CM

## 2024-01-04 DIAGNOSIS — I129 Hypertensive chronic kidney disease with stage 1 through stage 4 chronic kidney disease, or unspecified chronic kidney disease: Secondary | ICD-10-CM | POA: Diagnosis not present

## 2024-01-04 LAB — CMP (CANCER CENTER ONLY)
ALT: 18 U/L (ref 0–44)
AST: 19 U/L (ref 15–41)
Albumin: 2.9 g/dL — ABNORMAL LOW (ref 3.5–5.0)
Alkaline Phosphatase: 138 U/L — ABNORMAL HIGH (ref 38–126)
Anion gap: 10 (ref 5–15)
BUN: 65 mg/dL — ABNORMAL HIGH (ref 8–23)
CO2: 19 mmol/L — ABNORMAL LOW (ref 22–32)
Calcium: 7.8 mg/dL — ABNORMAL LOW (ref 8.9–10.3)
Chloride: 107 mmol/L (ref 98–111)
Creatinine: 3.19 mg/dL — ABNORMAL HIGH (ref 0.61–1.24)
GFR, Estimated: 18 mL/min — ABNORMAL LOW (ref >60)
Glucose, Bld: 145 mg/dL — ABNORMAL HIGH (ref 70–99)
Potassium: 4.6 mmol/L (ref 3.5–5.1)
Sodium: 136 mmol/L (ref 135–145)
Total Bilirubin: 0.5 mg/dL (ref 0.0–1.2)
Total Protein: 4.9 g/dL — ABNORMAL LOW (ref 6.5–8.1)

## 2024-01-04 LAB — CBC WITH DIFFERENTIAL (CANCER CENTER ONLY)
Abs Immature Granulocytes: 0.02 10*3/uL (ref 0.00–0.07)
Basophils Absolute: 0 10*3/uL (ref 0.0–0.1)
Basophils Relative: 0 %
Eosinophils Absolute: 0.1 10*3/uL (ref 0.0–0.5)
Eosinophils Relative: 2 %
HCT: 24.7 % — ABNORMAL LOW (ref 39.0–52.0)
Hemoglobin: 7.6 g/dL — ABNORMAL LOW (ref 13.0–17.0)
Immature Granulocytes: 0 %
Lymphocytes Relative: 69 %
Lymphs Abs: 3.9 10*3/uL (ref 0.7–4.0)
MCH: 32.8 pg (ref 26.0–34.0)
MCHC: 30.8 g/dL (ref 30.0–36.0)
MCV: 106.5 fL — ABNORMAL HIGH (ref 80.0–100.0)
Monocytes Absolute: 0.3 10*3/uL (ref 0.1–1.0)
Monocytes Relative: 4 %
Neutro Abs: 1.5 10*3/uL — ABNORMAL LOW (ref 1.7–7.7)
Neutrophils Relative %: 25 %
Platelet Count: 24 10*3/uL — ABNORMAL LOW (ref 150–400)
RBC: 2.32 MIL/uL — ABNORMAL LOW (ref 4.22–5.81)
RDW: 17.4 % — ABNORMAL HIGH (ref 11.5–15.5)
WBC Count: 5.7 10*3/uL (ref 4.0–10.5)
nRBC: 0 % (ref 0.0–0.2)

## 2024-01-04 LAB — PREPARE RBC (CROSSMATCH)

## 2024-01-04 NOTE — Progress Notes (Unsigned)
 Having fatigue and night sweats. Has also been having diarrhea.

## 2024-01-04 NOTE — Progress Notes (Unsigned)
 [facility]  [phone] [phone]  ID: Patrick Catholic Specht Jr. OB: [birth year]  MR#: [id number]  WUX#:[id number]  Patient Care Team: Marguarite Arbour, MD as PCP - General (Internal Medicine) Jeralyn Ruths, MD as Consulting Physician (Oncology)  CHIEF COMPLAINT: CLL, anemia secondary to chronic renal insufficiency, thrombocytopenia.  INTERVAL HISTORY: Patient returns to clinic today for repeat laboratory work, further evaluation, consideration of additional blood.  He feels mildly improved since receiving 1 unit of blood 2 weeks ago, but still has significant weakness and fatigue.  He continues to have easy bruising as well.  He denies any fevers, night sweats, or weight loss. He has noted no new lymphadenopathy.  He has no neurologic complaints today.  He denies any chest pain, shortness of breath, cough, or hemoptysis.  He denies any nausea, vomiting, constipation, or diarrhea. He has no urinary complaints.  Patient offers no further specific complaints today.  REVIEW OF SYSTEMS:   Review of Systems  Constitutional:  Positive for malaise/fatigue. Negative for diaphoresis, fever and weight loss.  Respiratory: Negative.  Negative for cough and shortness of breath.   Cardiovascular: Negative.  Negative for chest pain and leg swelling.  Gastrointestinal: Negative.  Negative for abdominal pain, blood in stool and melena.  Genitourinary: Negative.  Negative for dysuria.  Musculoskeletal: Negative.  Negative for back pain.  Skin: Negative.  Negative for rash.  Neurological:  Positive for weakness. Negative for dizziness, sensory change, focal weakness and headaches.  Endo/Heme/Allergies:  Bruises/bleeds easily.  Psychiatric/Behavioral: Negative.  The patient is not nervous/anxious.     As per HPI. Otherwise, a complete review of systems is negative.  PAST MEDICAL HISTORY: Past Medical History:  Diagnosis Date  . Anxiety   . CKD (chronic kidney  disease), stage III (HCC)   . CLL (chronic lymphocytic leukemia) (HCC)   . Diabetes mellitus without complication (HCC)   . Diverticulitis 04/09/2017  . History of kidney stones   . HLD (hyperlipidemia)   . HTN (hypertension)   . PKD (polycystic kidney disease)   . Sleep apnea   . Uncontrolled type 2 diabetes mellitus with hyperglycemia (HCC) 06/28/2018    PAST SURGICAL HISTORY: Past Surgical History:  Procedure Laterality Date  . CARDIAC CATHETERIZATION  12/1987  . CENTRAL LINE INSERTION N/A 04/11/2017   Procedure: Central Line Insertion;  Surgeon: Gilda Crease Latina Craver, MD;  Location: [facility] INVASIVE CV LAB;  Service: Cardiovascular;  Laterality: N/A;  . CERVICAL FUSION    . CHOLECYSTECTOMY    . CYSTOSCOPY W/ RETROGRADES Left 05/12/2017   Procedure: CYSTOSCOPY WITH RETROGRADE PYELOGRAM;  Surgeon: Hildred Laser, MD;  Location: [facility] ORS;  Service: Urology;  Laterality: Left;  . CYSTOSCOPY W/ URETERAL STENT PLACEMENT Left 05/12/2017   Procedure: CYSTOSCOPY WITH STENT REMOVAL;  Surgeon: Hildred Laser, MD;  Location: [facility] OR[facility];  Service: Urology;  Laterality: Left;  . CYSTOSCOPY WITH STENT PLACEMENT Left 04/11/2017   Procedure: CYSTOSCOPY WITH STENT PLACEMENT;  Surgeon: Jerilee Field, MD;  Location: [facility] ORS;  Service: Urology;  Laterality: Left;  . URETEROSCOPY  05/12/2017   Procedure: URETEROSCOPY;  Surgeon: Hildred Laser, MD;  Location: [facility] OR[facility];  Service: Urology;;    FAMILY HISTORY: Reported history of ovarian and lung cancer. Diabetes, hypertension.     ADVANCED DIRECTIVES:    HEALTH MAINTENANCE: Social History   Tobacco Use  . Smoking status: Former    Passive exposure: Past  . Smokeless tobacco: Never  . Tobacco comments:    quit in  Feb of 1997  Substance Use Topics  . Alcohol use: No  . Drug use: No     Colonoscopy:  PAP:  Bone density:  Lipid panel:  Allergies  Allergen Reactions  . Celecoxib Other (See Comments)    Increased Blood Pressure  .  Chlorpromazine Nausea Only  . Iodinated Contrast Media Other (See Comments)    Stage 3 kidney disease, told IV dye would be bad for him  . Prednisone Other (See Comments)    Diabetic  . Amoxicillin-Pot Clavulanate Rash    Has patient had a PCN reaction causing immediate rash, facial/tongue/throat swelling, SOB or lightheadedness with hypotension: No Has patient had a PCN reaction causing severe rash involving mucus membranes or skin necrosis: No Has patient had a PCN reaction that required hospitalization: No Has patient had a PCN reaction occurring within the last 10 years: Yes If all of the above answers are "NO", then may proceed with Cephalosporin use.   Marland Kitchen Penicillin V Potassium Rash    Current Outpatient Medications  Medication Sig Dispense Refill  . Aflibercept 2 MG/0.05ML SOLN Inject 1 Dose into the eye as directed. One injection into left eye every 8-9 weeks    . allopurinol (ZYLOPRIM) 100 MG tablet Take 100 mg by mouth daily.    Marland Kitchen atorvastatin (LIPITOR) 10 MG tablet Take 10 mg by mouth at bedtime.     . BD PEN NEEDLE NANO 2ND GEN 32G X 4 MM MISC USE AS DIRECTED ONCE A DAY    . carvedilol (COREG) 12.5 MG tablet Take 12.5 mg by mouth 2 (two) times daily with a meal.    . Cholecalciferol (VITAMIN D-3) 5000 units TABS Take 5,000 Units by mouth at bedtime.     . clonazePAM (KLONOPIN) 1 MG tablet Take 1 mg by mouth at bedtime.     . CONTOUR NEXT TEST test strip daily.    . cyanocobalamin 500 MCG tablet Take 1,000 mcg by mouth at bedtime.     . empagliflozin (JARDIANCE) 10 MG TABS tablet 10 mg daily.    Marland Kitchen epoetin alfa-epbx (RETACRIT) 84696 UNIT/ML injection Inject 40,000 Units into the skin every 6 (six) weeks. Depending on lab results (if needed)    . fexofenadine (ALLEGRA) 180 MG tablet Take 180 mg by mouth daily as needed for allergies.    . finasteride (PROSCAR) 5 MG tablet Take 1 tablet (5 mg total) by mouth daily. 90 tablet 3  . folic acid (FOLVITE) 400 MCG tablet Take 400 mcg  by mouth daily.    . insulin glargine (LANTUS) 100 UNIT/ML injection Inject 20-30 Units into the skin every morning. Takes Lantus 20 units if CBG less than 120 mg/dl or Lantus 30 units if CBG is over 120 mg/dl    . Multiple Vitamins-Minerals (PRESERVISION AREDS 2 PO) Take 1 capsule by mouth 2 (two) times daily.     Marland Kitchen omega-3 acid ethyl esters (LOVAZA) 1 g capsule Take by mouth 2 (two) times daily.    . prednisoLONE acetate (PRED FORTE) 1 % ophthalmic suspension Place 1 drop into the right eye 4 (four) times daily.    . sertraline (ZOLOFT) 50 MG tablet Take 100 mg by mouth daily.    . tamsulosin (FLOMAX) 0.4 MG CAPS capsule TAKE 1 CAPSULE(0.4 MG) BY MOUTH DAILY AFTER SUPPER 90 capsule 3  . telmisartan (MICARDIS) 40 MG tablet Take 40 mg by mouth daily.    Marland Kitchen torsemide (DEMADEX) 10 MG tablet Take by mouth.    . triamcinolone (  NASACORT) 55 MCG/ACT AERO nasal inhaler Place 2 sprays into the nose 2 (two) times daily as needed (allergies).      No current facility-administered medications for this visit.   Facility-Administered Medications Ordered in Other Visits  Medication Dose Route Frequency Provider Last Rate Last Admin  . epoetin alfa-epbx (RETACRIT) injection 40,000 Units  40,000 Units Subcutaneous Once Jeralyn Ruths, MD        OBJECTIVE: There were no vitals filed for this visit.      Body mass index is 29.44 kg/m.    ECOG FS:1 - Symptomatic but completely ambulatory  General: Well-developed, well-nourished, no acute distress. Eyes: Pink conjunctiva, anicteric sclera. HEENT: Normocephalic, moist mucous membranes. Lungs: No audible wheezing or coughing. Heart: Regular rate and rhythm. Abdomen: Soft, nontender, no obvious distention. Musculoskeletal: No edema, cyanosis, or clubbing. Neuro: Alert, answering all questions appropriately. Cranial nerves grossly intact. Skin: No rashes or petechiae noted.  Multiple ecchymosis noted on upper extremity. Psych: Normal affect.  LAB  RESULTS:  Lab Results  Component Value Date   NA 135 12/27/2023   K 4.3 12/27/2023   CL 112 (H) 12/27/2023   CO2 19 (L) 12/27/2023   GLUCOSE 177 (H) 12/27/2023   BUN 51 (H) 12/27/2023   CREATININE 2.98 (H) 12/27/2023   CALCIUM 7.3 (L) 12/27/2023   PROT 4.6 (L) 12/27/2023   ALBUMIN 2.8 (L) 12/27/2023   AST 13 (L) 12/27/2023   ALT 15 12/27/2023   ALKPHOS 115 12/27/2023   BILITOT 0.6 12/27/2023   GFRNONAA 20 (L) 12/27/2023   GFRAA 37 (L) 11/19/2019    Lab Results  Component Value Date   WBC 5.7 12/27/2023   NEUTROABS 1.8 12/27/2023   HGB 6.9 (LL) 12/27/2023   HCT 22.8 (L) 12/27/2023   MCV 108.6 (H) 12/27/2023   PLT 21 (L) 12/27/2023   Lab Results  Component Value Date   IRON 67 12/15/2022   TIBC 251 12/15/2022   IRONPCTSAT 27 12/15/2022   Lab Results  Component Value Date   FERRITIN 31 08/25/2021     STUDIES: CT ABDOMEN PELVIS WO CONTRAST Result Date: 12/26/2023 CLINICAL DATA:  Abdominal aortic aneurism follow-up EXAM: CT ABDOMEN AND PELVIS WITHOUT CONTRAST TECHNIQUE: Multidetector CT imaging of the abdomen and pelvis was performed following the standard protocol without IV contrast. RADIATION DOSE REDUCTION: This exam was performed according to the departmental dose-optimization program which includes automated exposure control, adjustment of the mA and/or kV according to patient size and/or use of iterative reconstruction technique. COMPARISON:  CT abdomen and pelvis August 25, 2023 FINDINGS: Lower chest: No acute abnormality. Hepatobiliary: There is mild increased density of the liver. There is no focal hepatic parenchymal lesions. There is a small amount of ascites now seen surrounding the liver compared with prior examination. The spleen appears slightly enlarged at 15 cm similar to prior examination spleen also measures 15 cm in longitudinal diameter and 12 in transverse diameter. Findings could reflect of mild hepatocellular disease such as cirrhosis with mild  splenomegaly and post fall mild portal hypertension minimal ascites. Unremarkable without masses. The left adrenal gland is normal. The right adrenal gland is normal. The left kidney is normal. With the cortical cyst in the lateral cortical region of the lower pole distribution measuring 1.8 cm, unchanged since prior examination. The right kidney is unchanged since prior examination. Replaced by numerous cysts the kidney measures again up to 25 cm in length on sagittal images Comparison with prior examination again is noted the infrarenal abdominal aortic  aneurysms, fusiform in appearance, measuring 5.1 cm in maximum AP diameter and 4.9 cm in maximum transverse diameter with a length of about 7 cm including just above the aortic bifurcation. No retroperitoneal masses or adenopathy There is diffuse diverticulosis of the sigmoid and descending colon without evidence of diverticulitis. Small amount of fluid in the cul-de-sac of the pelvis correlate with the previously described ascites. No retroperitoneal masses or adenopathy. There is diffuse stranding of the anterior abdominal wall subcutaneous tissues similar to prior examination from 2024 with no change in the small supraumbilical epigastric right paramidline hernia defect containing fat only. Degenerative disc disease L4-5 and L5-S1. IMPRESSION: *Stable infrarenal abdominal aortic aneurysm measuring 5.1 cm in maximum AP diameter and 4.9 cm in maximum transverse diameter with a length of about 7 cm including just above the aortic bifurcation. *Mild increased density of the liver with mild splenomegaly and mild ascites. Findings could reflect mild hepatocellular disease such as cirrhosis with mild splenomegaly and mild portal hypertension. *Diverticulosis of the sigmoid and descending colon without evidence of diverticulitis. *Stable diffuse stranding of the anterior abdominal wall subcutaneous tissues with no change in the small supraumbilical epigastric right  paramidline hernia defect containing fat only. *Stable left renal cyst. *Stable right renal cyst. *Stable degenerative disc disease L4-5 and L5-S1. *Follow-up CT should be low-dose protocol and included thin (1 mm) images. Electronically Signed   By: Shaaron Adler M.D.   On: 12/26/2023 19:49    ASSESSMENT: Rai stage I CLL, anemia secondary to chronic renal insufficiency.  PLAN:    Anemia secondary to chronic renal insufficiency: Despite 1 unit of packed red blood cells approximately 2 weeks ago, patient's hemoglobin only improved to 6.9.  He continues to deny any blood loss.  Return to clinic tomorrow for 1 unit of packed red blood cells.  Patient would then return to clinic in 1 week with repeat laboratory work and further evaluation.  If patient's hemoglobin remains persistently decreased, will consider bone marrow biopsy for further evaluation. CLL: Chronic and unchanged.  Patient's total white blood count is within normal limits today.  No intervention is needed. Continue simple observation. Thrombocytopenia: Patient's platelet count has trended down slightly to 21.  Previously, patient required multiple transfusions of platelets prior to his oral surgery.  He did not have any excessive bleeding during his surgery.  His count was as high as 95 in June 2016.  But more recently between has ranged from 32-63 since June 2019.  Hesitant to use steroids given his underlying diabetes and is unclear if Rituxan will be effective.  He does not require transfusion today.  Return to clinic as scheduled.  Consider bone marrow biopsy as above. Lymphadenopathy: CT scan result from December 26, 2023 reviewed independently and report as above with no obvious lymphadenopathy. Aortic aneurysm: Appreciate vascular surgery input.  Patient elected to hold off any procedures until his aneurysm reaches 5.5 cm. Dental surgery: Successful and resolved.   Patient expressed understanding and was in agreement with this plan. He  also understands that He can call clinic at any time with any questions, concerns, or complaints.   Jeralyn Ruths, MD   01/04/2024 2:40 PM

## 2024-01-05 ENCOUNTER — Other Ambulatory Visit: Payer: Self-pay

## 2024-01-05 ENCOUNTER — Other Ambulatory Visit: Payer: Self-pay | Admitting: Interventional Radiology

## 2024-01-05 ENCOUNTER — Inpatient Hospital Stay

## 2024-01-05 ENCOUNTER — Encounter: Payer: Self-pay | Admitting: Oncology

## 2024-01-05 DIAGNOSIS — Z01818 Encounter for other preprocedural examination: Secondary | ICD-10-CM

## 2024-01-05 DIAGNOSIS — C9110 Chronic lymphocytic leukemia of B-cell type not having achieved remission: Secondary | ICD-10-CM

## 2024-01-05 DIAGNOSIS — I129 Hypertensive chronic kidney disease with stage 1 through stage 4 chronic kidney disease, or unspecified chronic kidney disease: Secondary | ICD-10-CM | POA: Diagnosis not present

## 2024-01-05 MED ORDER — DIPHENHYDRAMINE HCL 50 MG/ML IJ SOLN
25.0000 mg | Freq: Once | INTRAMUSCULAR | Status: AC
  Administered 2024-01-05: 25 mg via INTRAVENOUS
  Filled 2024-01-05: qty 1

## 2024-01-05 MED ORDER — SODIUM CHLORIDE 0.9% FLUSH
3.0000 mL | INTRAVENOUS | Status: DC | PRN
Start: 2024-01-05 — End: 2024-01-05
  Filled 2024-01-05: qty 3

## 2024-01-05 MED ORDER — ACETAMINOPHEN 325 MG PO TABS
650.0000 mg | ORAL_TABLET | Freq: Once | ORAL | Status: AC
  Administered 2024-01-05: 650 mg via ORAL
  Filled 2024-01-05: qty 2

## 2024-01-05 MED ORDER — SODIUM CHLORIDE 0.9% IV SOLUTION
250.0000 mL | INTRAVENOUS | Status: DC
Start: 2024-01-05 — End: 2024-01-05
  Administered 2024-01-05: 100 mL via INTRAVENOUS
  Filled 2024-01-05: qty 250

## 2024-01-05 NOTE — Patient Instructions (Signed)

## 2024-01-05 NOTE — Progress Notes (Signed)
 Patient for IR Bone Marrow Biopsy on Mon 01/08/24, I called and spoke with the patient on the phone and gave pre-procedure instructions. Pt was made aware to be here at 7:30a, NPO after MN prior to procedure as well as driver post procedure/recovery/discharge. Pt stated understanding.  Called 01/05/24

## 2024-01-06 LAB — BPAM RBC
Blood Product Expiration Date: 202504182359
ISSUE DATE / TIME: 202503281408
Unit Type and Rh: 5100

## 2024-01-06 LAB — TYPE AND SCREEN
ABO/RH(D): A POS
Antibody Screen: NEGATIVE
Unit division: 0

## 2024-01-07 NOTE — H&P (Signed)
 Chief Complaint: Patient was seen in consultation today for refractory chronic anemia requiring multiple transfusions, and consideration for bone marrow biopsy.  Referring Provider(s): Dr. Gerarda Fraction, MD  Supervising Physician: Irish Lack  Patient Status: [facility] - Out-pt  Patient is Full Code  History of Present Illness: Patrick Payne. is a 89 y.o. male with PMHx notable for HTN, HLD, T2DM, OSA, CKD III, CLL, polycystic kidney disease, nephrolithiasis, and anxiety.  Per Dr. Milinda Cave progress note on 3/27: " Anemia, unspecified: Despite multiple units of packed red blood cells over the past several weeks, patient's hemoglobin is only increased to 7.6.  Return to clinic tomorrow for 1 additional unit of packed red blood cells.  Patient will have a bone marrow biopsy scheduled on Monday, January 08, 2024.  Return to clinic in 1 week for further evaluation and consideration of additional blood.  All blood products should be irradiated. CLL: Chronic and unchanged.  Patient's total white blood count is within normal limits today.  No intervention is needed. Continue simple observation. Thrombocytopenia: Chronic and unchanged.  Patient's platelet count is 24.  Bone marrow biopsy as above.  Previously, patient required multiple transfusions of platelets prior to his oral surgery.  He did not have any excessive bleeding during his surgery.  His count was as high as 95 in June 2016.  But more recently between has ranged from 21-63 since June 2019.  Hesitant to use steroids given his underlying diabetes and is unclear if Rituxan will be effective.  He does not require transfusion today.  Return to clinic as scheduled.  Lymphadenopathy: CT scan result from December 26, 2023 reviewed independently and report as above with no obvious lymphadenopathy."  Interventional Radiology was requested for bone marrow biopsy and aspiration. Patient is scheduled for same in IR today.  All labs and  medications are within acceptable parameters. No pertinent allergies. Patient has been NPO since midnight.    Currently without any significant complaints. Patient alert and laying in bed,calm. Denies any fevers, headache, chest pain, SOB, cough, abdominal pain, nausea, vomiting or bleeding.    Past Medical History:  Diagnosis Date   Anxiety    CKD (chronic kidney disease), stage III (HCC)    CLL (chronic lymphocytic leukemia) (HCC)    Diabetes mellitus without complication (HCC)    Diverticulitis 04/09/2017   History of kidney stones    HLD (hyperlipidemia)    HTN (hypertension)    PKD (polycystic kidney disease)    Sleep apnea    Uncontrolled type 2 diabetes mellitus with hyperglycemia (HCC) 06/28/2018    Past Surgical History:  Procedure Laterality Date   CARDIAC CATHETERIZATION  12/1987   CENTRAL LINE INSERTION N/A 04/11/2017   Procedure: Central Line Insertion;  Surgeon: Renford Dills, MD;  Location: [facility] INVASIVE CV LAB;  Service: Cardiovascular;  Laterality: N/A;   CERVICAL FUSION     CHOLECYSTECTOMY     CYSTOSCOPY W/ RETROGRADES Left 05/12/2017   Procedure: CYSTOSCOPY WITH RETROGRADE PYELOGRAM;  Surgeon: Hildred Laser, MD;  Location: [facility] ORS;  Service: Urology;  Laterality: Left;   CYSTOSCOPY W/ URETERAL STENT PLACEMENT Left 05/12/2017   Procedure: CYSTOSCOPY WITH STENT REMOVAL;  Surgeon: Hildred Laser, MD;  Location: [facility] ORS;  Service: Urology;  Laterality: Left;   CYSTOSCOPY WITH STENT PLACEMENT Left 04/11/2017   Procedure: CYSTOSCOPY WITH STENT PLACEMENT;  Surgeon: Jerilee Field, MD;  Location: [facility] ORS;  Service: Urology;  Laterality: Left;   URETEROSCOPY  05/12/2017  Procedure: URETEROSCOPY;  Surgeon: Hildred Laser, MD;  Location: [facility] ORS;  Service: Urology;;    Allergies: Celecoxib, Chlorpromazine, Iodinated contrast media, Prednisone, Amoxicillin-pot clavulanate, and Penicillin v potassium  Medications: Prior to Admission medications    Medication Sig Start Date End Date Taking? Authorizing Provider  Aflibercept 2 MG/0.05ML SOLN Inject 1 Dose into the eye as directed. One injection into left eye every 8-9 weeks    [provider]  allopurinol (ZYLOPRIM) 100 MG tablet Take 100 mg by mouth daily.    [provider]  atorvastatin (LIPITOR) 10 MG tablet Take 10 mg by mouth at bedtime.  09/02/15   [provider]  BD PEN NEEDLE NANO 2ND GEN 32G X 4 MM MISC USE AS DIRECTED ONCE A DAY 08/01/22   [provider]  carvedilol (COREG) 12.5 MG tablet Take 12.5 mg by mouth 2 (two) times daily with a meal.    [provider]  Cholecalciferol (VITAMIN D-3) 5000 units TABS Take 5,000 Units by mouth at bedtime.     [provider]  clonazePAM (KLONOPIN) 1 MG tablet Take 1 mg by mouth at bedtime.  09/06/15   [provider]  CONTOUR NEXT TEST test strip daily. 06/27/22   [provider]  cyanocobalamin 500 MCG tablet Take 1,000 mcg by mouth at bedtime.     [provider]  empagliflozin (JARDIANCE) 10 MG TABS tablet 10 mg daily. 03/26/21   [provider]  epoetin alfa-epbx (RETACRIT) 40981 UNIT/ML injection Inject 40,000 Units into the skin every 6 (six) weeks. Depending on lab results (if needed)    [provider]  fexofenadine (ALLEGRA) 180 MG tablet Take 180 mg by mouth daily as needed for allergies.    [provider]  finasteride (PROSCAR) 5 MG tablet Take 1 tablet (5 mg total) by mouth daily. 08/17/23   Sondra Come, MD  folic acid (FOLVITE) 400 MCG tablet Take 400 mcg by mouth daily.    [provider]  insulin glargine (LANTUS) 100 UNIT/ML injection Inject 20-30 Units into the skin every morning. Takes Lantus 20 units if CBG less than 120 mg/dl or Lantus 30 units if CBG is over 120 mg/dl    [provider]  Multiple Vitamins-Minerals (PRESERVISION AREDS 2 PO) Take 1 capsule by mouth 2 (two) times daily.      [provider]  omega-3 acid ethyl esters (LOVAZA) 1 g capsule Take by mouth 2 (two) times daily.    [provider]  prednisoLONE acetate (PRED FORTE) 1 % ophthalmic suspension Place 1 drop into the right eye 4 (four) times daily. 03/27/23   [provider]  sertraline (ZOLOFT) 50 MG tablet Take 100 mg by mouth daily. 02/17/23   [provider]  tamsulosin (FLOMAX) 0.4 MG CAPS capsule TAKE 1 CAPSULE(0.4 MG) BY MOUTH DAILY AFTER SUPPER 08/07/23   Sondra Come, MD  telmisartan (MICARDIS) 40 MG tablet Take 40 mg by mouth daily.    [provider]  torsemide (DEMADEX) 10 MG tablet Take by mouth. 11/08/23 11/07/24  [provider]  triamcinolone (NASACORT) 55 MCG/ACT AERO nasal inhaler Place 2 sprays into the nose 2 (two) times daily as needed (allergies).     [provider]     Family History  Problem Relation Age of Onset   Cancer Mother    Varicose Veins Mother    Cancer Father     Social History   Socioeconomic History   Marital status: Married  Spouse name: Not on file   Number of children: Not on file   Years of education: Not on file   Highest education level: Not on file  Occupational History   Occupation: Comptroller    Comment: retired  Tobacco Use   Smoking status: Former    Passive exposure: Past   Smokeless tobacco: Never   Tobacco comments:    quit in Feb of 1997  Substance and Sexual Activity   Alcohol use: No   Drug use: No   Sexual activity: Not Currently  Other Topics Concern   Not on file  Social History Narrative   Worked as a Comptroller, now retired. Went to [employer]. Has children and grand children. Married. Grand daughter is an NP in GU oncology at [employer].    Social Drivers of Corporate investment banker Strain: Low Risk  (07/31/2023)   Received from [facility]   Overall Financial Resource Strain (CARDIA)    Difficulty of Paying Living Expenses:  Not hard at all  Food Insecurity: No Food Insecurity (07/31/2023)   Received from [facility]   Hunger Vital Sign    Worried About Running Out of Food in the Last Year: Never true    Ran Out of Food in the Last Year: Never true  Transportation Needs: No Transportation Needs (07/31/2023)   Received from [facility] - Transportation    In the past 12 months, has lack of transportation kept you from medical appointments or from getting medications?: No    Lack of Transportation (Non-Medical): No  Physical Activity: Not on file  Stress: Not on file  Social Connections: Not on file     Review of Systems: A 12 point ROS discussed and pertinent positives are indicated in the HPI above.  All other systems are negative.   Vital Signs: There were no vitals taken for this visit.  Advance Care Plan: The advanced care place/surrogate decision maker was discussed at the time of visit and the patient did not wish to discuss or was not able to name a surrogate decision maker or provide an advance care plan.  Physical Exam Constitutional:      General: He is not in acute distress.    Appearance: Normal appearance. He is normal weight. He is not ill-appearing.  HENT:     Mouth/Throat:     Mouth: Mucous membranes are dry.  Cardiovascular:     Rate and Rhythm: Regular rhythm. Tachycardia present.     Pulses: Normal pulses.     Heart sounds: No murmur heard. Pulmonary:     Effort: Pulmonary effort is normal.     Breath sounds: Normal breath sounds. No wheezing.  Abdominal:     General: Abdomen is flat.     Palpations: Abdomen is soft.     Tenderness: There is no abdominal tenderness.  Musculoskeletal:        General: Normal range of motion.     Cervical back: Normal range of motion.  Skin:    General: Skin is warm and dry.  Neurological:     Mental Status: He is alert and oriented to person, place, and time.  Psychiatric:        Mood and  Affect: Mood normal.        Behavior: Behavior normal.        Thought Content: Thought content normal.        Judgment: Judgment normal.  Imaging: No results found.  Labs:  CBC: Recent Labs    11/20/23 1358 12/13/23 1344 12/27/23 1326 01/04/24 1418  WBC 8.1 6.6 5.7 5.7  HGB 7.6* 6.3* 6.9* 7.6*  HCT 24.6* 20.3* 22.8* 24.7*  PLT 26* 24* 21* 24*    COAGS: No results for input(s): "INR", "APTT" in the last 8760 hours.  BMP: Recent Labs    11/20/23 1358 12/13/23 1344 12/27/23 1326 01/04/24 1418  NA 136 134* 135 136  K 4.3 4.6 4.3 4.6  CL 107 111 112* 107  CO2 23 17* 19* 19*  GLUCOSE 161* 163* 177* 145*  BUN 45* 63* 51* 65*  CALCIUM 7.8* 7.8* 7.3* 7.8*  CREATININE 2.49* 3.13* 2.98* 3.19*  GFRNONAA 24* 19* 20* 18*    LIVER FUNCTION TESTS: Recent Labs    11/20/23 1358 12/13/23 1344 12/27/23 1326 01/04/24 1418  BILITOT 0.8 0.7 0.6 0.5  AST 15 15 13* 19  ALT 18 18 15 18   ALKPHOS 122 127* 115 138*  PROT 4.8* 4.6* 4.6* 4.9*  ALBUMIN 3.0* 2.8* 2.8* 2.9*    TUMOR MARKERS: No results for input(s): "AFPTM", "CEA", "CA199", "CHROMGRNA" in the last 8760 hours.  Assessment and Plan: Patient with a history of anemia refractory to repeated transfusions, thrombocytopenia, and CLL.  He presents for scheduled bone marrow biopsy and aspiration in IR today, as recommended by his oncologist.  Risks and benefits of bone marrow biopsy and aspiration was discussed with the patient and/or patient's family including, but not limited to bleeding, infection, damage to adjacent structures or low yield requiring additional tests.  All of the questions were answered and there is agreement to proceed.  Consent signed and in chart.    Thank you for allowing our service to participate in Patrick Payne. 's care.  Electronically Signed: Sable Feil, PA-C   01/07/2024, 2:38 PM      I spent a total of  30 Minutes  in face to face in clinical consultation,  greater than 50% of which was counseling/coordinating care for refractory chronic anemia requiring multiple transfusions, and consideration for bone marrow biopsy.

## 2024-01-08 ENCOUNTER — Other Ambulatory Visit: Payer: Self-pay

## 2024-01-08 ENCOUNTER — Ambulatory Visit
Admission: RE | Admit: 2024-01-08 | Discharge: 2024-01-08 | Disposition: A | Discharge status: Home / Self Care | Source: Ambulatory Visit | Attending: Oncology | Admitting: Oncology

## 2024-01-08 DIAGNOSIS — C9110 Chronic lymphocytic leukemia of B-cell type not having achieved remission: Secondary | ICD-10-CM | POA: Diagnosis not present

## 2024-01-08 DIAGNOSIS — N183 Chronic kidney disease, stage 3 unspecified: Secondary | ICD-10-CM | POA: Insufficient documentation

## 2024-01-08 DIAGNOSIS — D759 Disease of blood and blood-forming organs, unspecified: Secondary | ICD-10-CM | POA: Insufficient documentation

## 2024-01-08 DIAGNOSIS — F419 Anxiety disorder, unspecified: Secondary | ICD-10-CM | POA: Diagnosis not present

## 2024-01-08 DIAGNOSIS — D539 Nutritional anemia, unspecified: Secondary | ICD-10-CM | POA: Insufficient documentation

## 2024-01-08 DIAGNOSIS — Z01818 Encounter for other preprocedural examination: Secondary | ICD-10-CM | POA: Insufficient documentation

## 2024-01-08 DIAGNOSIS — I129 Hypertensive chronic kidney disease with stage 1 through stage 4 chronic kidney disease, or unspecified chronic kidney disease: Secondary | ICD-10-CM | POA: Insufficient documentation

## 2024-01-08 DIAGNOSIS — D696 Thrombocytopenia, unspecified: Secondary | ICD-10-CM | POA: Diagnosis not present

## 2024-01-08 DIAGNOSIS — Z794 Long term (current) use of insulin: Secondary | ICD-10-CM | POA: Diagnosis not present

## 2024-01-08 DIAGNOSIS — Q613 Polycystic kidney, unspecified: Secondary | ICD-10-CM | POA: Diagnosis not present

## 2024-01-08 DIAGNOSIS — Z7984 Long term (current) use of oral hypoglycemic drugs: Secondary | ICD-10-CM | POA: Diagnosis not present

## 2024-01-08 DIAGNOSIS — E785 Hyperlipidemia, unspecified: Secondary | ICD-10-CM | POA: Insufficient documentation

## 2024-01-08 DIAGNOSIS — E1122 Type 2 diabetes mellitus with diabetic chronic kidney disease: Secondary | ICD-10-CM | POA: Insufficient documentation

## 2024-01-08 LAB — CBC WITH DIFFERENTIAL/PLATELET
Abs Immature Granulocytes: 0.01 10*3/uL (ref 0.00–0.07)
Basophils Absolute: 0 10*3/uL (ref 0.0–0.1)
Basophils Relative: 0 %
Eosinophils Absolute: 0.1 10*3/uL (ref 0.0–0.5)
Eosinophils Relative: 2 %
HCT: 26.2 % — ABNORMAL LOW (ref 39.0–52.0)
Hemoglobin: 8.5 g/dL — ABNORMAL LOW (ref 13.0–17.0)
Immature Granulocytes: 0 %
Lymphocytes Relative: 73 %
Lymphs Abs: 6 10*3/uL — ABNORMAL HIGH (ref 0.7–4.0)
MCH: 33.6 pg (ref 26.0–34.0)
MCHC: 32.4 g/dL (ref 30.0–36.0)
MCV: 103.6 fL — ABNORMAL HIGH (ref 80.0–100.0)
Monocytes Absolute: 0.3 10*3/uL (ref 0.1–1.0)
Monocytes Relative: 4 %
Neutro Abs: 1.7 10*3/uL (ref 1.7–7.7)
Neutrophils Relative %: 21 %
Platelets: 23 10*3/uL — CL (ref 150–400)
RBC: 2.53 MIL/uL — ABNORMAL LOW (ref 4.22–5.81)
RDW: 18.3 % — ABNORMAL HIGH (ref 11.5–15.5)
Smear Review: DECREASED
WBC: 8.2 10*3/uL (ref 4.0–10.5)
nRBC: 0 % (ref 0.0–0.2)

## 2024-01-08 LAB — GLUCOSE, CAPILLARY
Glucose-Capillary: 84 mg/dL (ref 70–99)
Glucose-Capillary: 94 mg/dL (ref 70–99)

## 2024-01-08 LAB — PATHOLOGIST SMEAR REVIEW

## 2024-01-08 MED ORDER — LIDOCAINE 1 % OPTIME INJ - NO CHARGE
10.0000 mL | Freq: Once | INTRAMUSCULAR | Status: AC
  Administered 2024-01-08: 10 mL via SUBCUTANEOUS
  Filled 2024-01-08: qty 10

## 2024-01-08 MED ORDER — FENTANYL CITRATE (PF) 100 MCG/2ML IJ SOLN
INTRAMUSCULAR | Status: AC
  Filled 2024-01-08: qty 2

## 2024-01-08 MED ORDER — HEPARIN SOD (PORK) LOCK FLUSH 100 UNIT/ML IV SOLN
INTRAVENOUS | Status: AC
  Filled 2024-01-08: qty 5

## 2024-01-08 MED ORDER — SODIUM CHLORIDE 0.9 % IV SOLN: INTRAVENOUS | Status: DC

## 2024-01-08 MED ORDER — FENTANYL CITRATE (PF) 100 MCG/2ML IJ SOLN
INTRAMUSCULAR | Status: AC | PRN
  Administered 2024-01-08 (×2): 25 ug via INTRAVENOUS

## 2024-01-08 MED ORDER — MIDAZOLAM HCL 2 MG/2ML IJ SOLN
INTRAMUSCULAR | Status: AC
  Filled 2024-01-08: qty 2

## 2024-01-08 MED ORDER — MIDAZOLAM HCL 2 MG/2ML IJ SOLN
INTRAMUSCULAR | Status: AC | PRN
  Administered 2024-01-08 (×2): .5 mg via INTRAVENOUS

## 2024-01-08 NOTE — Procedures (Signed)
 Interventional Radiology Procedure Note  Procedure: CT guided bone marrow aspiration and biopsy  Complications: None  EBL: < 10 mL  Findings: Aspirate and core biopsy performed of bone marrow in right iliac bone.  Plan: Bedrest supine x 1 hrs  Patrick Payne T. Fredia Sorrow, M.D Pager:  [phone]

## 2024-01-08 NOTE — Discharge Instructions (Signed)
 Bone Marrow Aspiration and Bone Marrow Biopsy, Adult, Care After This sheet gives you information about how to care for yourself after your procedure. If you have problems or questions, contact your health care provider.  What can I expect after the procedure?  After the procedure, it is common to have: Mild pain and tenderness. Swelling. Bruising.  Follow these instructions at home: Take over-the-counter or prescription medicines only as told by your health care provider. You may shower tomorrow Remove band aid tomorrow, replace with another bandaid if  site has any drainage from biopsy site. Wash your hands with soap and water before you touch your biopsy site  If soap and water are not available, use hand sanitizer. Change your dressing frequently for bleeding and/or drainage. Check your puncture site every day for signs of infection. Check for: More redness, swelling, or pain. More fluid or blood. Warmth. Pus or a bad smell. Return to your normal activities in 24hours.  Do not drive for 24 hours if you were given a medicine to help you relax (sedative). Keep all follow-up visits as told by your health care provider. This is important. Contact a health care provider if: You have more redness, swelling, or pain around the puncture site. You have more fluid or blood coming from the puncture site. Your puncture site feels warm to the touch. You have pus or a bad smell coming from the puncture site. You have a fever. Your pain is not controlled with medicine. This information is not intended to replace advice given to you by your health care provider. Make sure you discuss any questions you have with your health care provider. Document Released: 04/15/2005 Document Revised: 04/15/2016 Document Reviewed: 03/09/2016 Elsevier Interactive Patient Education  2018 [employer].

## 2024-01-10 LAB — SURGICAL PATHOLOGY

## 2024-01-11 ENCOUNTER — Inpatient Hospital Stay: Attending: Oncology

## 2024-01-11 ENCOUNTER — Encounter: Payer: Self-pay | Admitting: Oncology

## 2024-01-11 ENCOUNTER — Telehealth: Payer: Self-pay | Admitting: Pharmacy Technician

## 2024-01-11 ENCOUNTER — Inpatient Hospital Stay (HOSPITAL_BASED_OUTPATIENT_CLINIC_OR_DEPARTMENT_OTHER): Admitting: Oncology

## 2024-01-11 ENCOUNTER — Other Ambulatory Visit (HOSPITAL_COMMUNITY): Payer: Self-pay

## 2024-01-11 VITALS — BP 122/59 | HR 57 | Temp 97.4°F | Ht 65.0 in | Wt 190.7 lb

## 2024-01-11 DIAGNOSIS — D696 Thrombocytopenia, unspecified: Secondary | ICD-10-CM | POA: Diagnosis not present

## 2024-01-11 DIAGNOSIS — Z87891 Personal history of nicotine dependence: Secondary | ICD-10-CM | POA: Diagnosis not present

## 2024-01-11 DIAGNOSIS — I129 Hypertensive chronic kidney disease with stage 1 through stage 4 chronic kidney disease, or unspecified chronic kidney disease: Secondary | ICD-10-CM | POA: Insufficient documentation

## 2024-01-11 DIAGNOSIS — D631 Anemia in chronic kidney disease: Secondary | ICD-10-CM | POA: Diagnosis not present

## 2024-01-11 DIAGNOSIS — Z79899 Other long term (current) drug therapy: Secondary | ICD-10-CM | POA: Diagnosis not present

## 2024-01-11 DIAGNOSIS — C9110 Chronic lymphocytic leukemia of B-cell type not having achieved remission: Secondary | ICD-10-CM | POA: Diagnosis not present

## 2024-01-11 DIAGNOSIS — N1832 Chronic kidney disease, stage 3b: Secondary | ICD-10-CM | POA: Insufficient documentation

## 2024-01-11 LAB — CBC WITH DIFFERENTIAL (CANCER CENTER ONLY)
Abs Immature Granulocytes: 0.02 10*3/uL (ref 0.00–0.07)
Basophils Absolute: 0 10*3/uL (ref 0.0–0.1)
Basophils Relative: 0 %
Eosinophils Absolute: 0.1 10*3/uL (ref 0.0–0.5)
Eosinophils Relative: 1 %
HCT: 24.3 % — ABNORMAL LOW (ref 39.0–52.0)
Hemoglobin: 7.5 g/dL — ABNORMAL LOW (ref 13.0–17.0)
Immature Granulocytes: 0 %
Lymphocytes Relative: 66 %
Lymphs Abs: 4.7 10*3/uL — ABNORMAL HIGH (ref 0.7–4.0)
MCH: 32.3 pg (ref 26.0–34.0)
MCHC: 30.9 g/dL (ref 30.0–36.0)
MCV: 104.7 fL — ABNORMAL HIGH (ref 80.0–100.0)
Monocytes Absolute: 0.6 10*3/uL (ref 0.1–1.0)
Monocytes Relative: 8 %
Neutro Abs: 1.8 10*3/uL (ref 1.7–7.7)
Neutrophils Relative %: 25 %
Platelet Count: 27 10*3/uL — ABNORMAL LOW (ref 150–400)
RBC: 2.32 MIL/uL — ABNORMAL LOW (ref 4.22–5.81)
RDW: 17.3 % — ABNORMAL HIGH (ref 11.5–15.5)
Smear Review: DECREASED
WBC Count: 7.3 10*3/uL (ref 4.0–10.5)
nRBC: 0 % (ref 0.0–0.2)

## 2024-01-11 LAB — CMP (CANCER CENTER ONLY)
ALT: 18 U/L (ref 0–44)
AST: 16 U/L (ref 15–41)
Albumin: 2.8 g/dL — ABNORMAL LOW (ref 3.5–5.0)
Alkaline Phosphatase: 134 U/L — ABNORMAL HIGH (ref 38–126)
Anion gap: 7 (ref 5–15)
BUN: 62 mg/dL — ABNORMAL HIGH (ref 8–23)
CO2: 18 mmol/L — ABNORMAL LOW (ref 22–32)
Calcium: 7.5 mg/dL — ABNORMAL LOW (ref 8.9–10.3)
Chloride: 110 mmol/L (ref 98–111)
Creatinine: 3.23 mg/dL — ABNORMAL HIGH (ref 0.61–1.24)
GFR, Estimated: 18 mL/min — ABNORMAL LOW (ref >60)
Glucose, Bld: 153 mg/dL — ABNORMAL HIGH (ref 70–99)
Potassium: 4.7 mmol/L (ref 3.5–5.1)
Sodium: 135 mmol/L (ref 135–145)
Total Bilirubin: 0.8 mg/dL (ref 0.0–1.2)
Total Protein: 4.6 g/dL — ABNORMAL LOW (ref 6.5–8.1)

## 2024-01-11 LAB — IRON AND TIBC
Iron: 79 ug/dL (ref 45–182)
Saturation Ratios: 26 % (ref 17.9–39.5)
TIBC: 304 ug/dL (ref 250–450)
UIBC: 225 ug/dL

## 2024-01-11 LAB — SAMPLE TO BLOOD BANK

## 2024-01-11 LAB — PREPARE RBC (CROSSMATCH)

## 2024-01-11 LAB — FERRITIN: Ferritin: 142 ng/mL (ref 24–336)

## 2024-01-11 NOTE — Telephone Encounter (Signed)
 Oral Oncology Patient Advocate Encounter   Received notification that prior authorization for [employer] is required.   PA submitted on 01/15/2024 Key [id number] Status is pending     [name], CPhT Oncology Pharmacy Patient Advocate [facility] Direct Number: [phone] Fax: [phone]

## 2024-01-12 ENCOUNTER — Inpatient Hospital Stay

## 2024-01-12 DIAGNOSIS — C9110 Chronic lymphocytic leukemia of B-cell type not having achieved remission: Secondary | ICD-10-CM

## 2024-01-12 MED ORDER — SODIUM CHLORIDE 0.9% IV SOLUTION
250.0000 mL | INTRAVENOUS | Status: DC
  Administered 2024-01-12: 25 mL via INTRAVENOUS
  Filled 2024-01-12: qty 250

## 2024-01-12 MED ORDER — ACETAMINOPHEN 325 MG PO TABS
650.0000 mg | ORAL_TABLET | Freq: Once | ORAL | Status: AC
  Administered 2024-01-12: 650 mg via ORAL
  Filled 2024-01-12: qty 2

## 2024-01-12 MED ORDER — DIPHENHYDRAMINE HCL 50 MG/ML IJ SOLN
25.0000 mg | Freq: Once | INTRAMUSCULAR | Status: AC
  Administered 2024-01-12: 25 mg via INTRAVENOUS
  Filled 2024-01-12: qty 1

## 2024-01-12 NOTE — Patient Instructions (Signed)

## 2024-01-13 ENCOUNTER — Encounter: Payer: Self-pay | Admitting: Oncology

## 2024-01-13 LAB — TYPE AND SCREEN
ABO/RH(D): A POS
Antibody Screen: NEGATIVE
Unit division: 0

## 2024-01-13 LAB — BPAM RBC
Blood Product Expiration Date: 202504302359
ISSUE DATE / TIME: 202504041315
Unit Type and Rh: 5100

## 2024-01-13 NOTE — Progress Notes (Signed)
 [facility]  Telephone:([phone] [phone]  ID: Patrick Payne Geisinger Jr. OB: 06-Sep-1934  MR#: [id number]  WER#:[id number]  Patient Care Team: Marguarite Arbour, MD as PCP - General (Internal Medicine) Jeralyn Ruths, MD as Consulting Physician (Oncology)  CHIEF COMPLAINT: CLL, anemia/thrombocytopenia.  INTERVAL HISTORY: Patient returns to clinic today for further evaluation, discussion of his bone marrow biopsy results, and treatment planning.  He continues to have chronic weakness and fatigue, but this is mildly improved.  He continues to have easy bruising as well.  He denies any fevers, night sweats, or weight loss. He has noted no new lymphadenopathy.  He has no neurologic complaints today.  He denies any chest pain, shortness of breath, cough, or hemoptysis.  He denies any nausea, vomiting, constipation, or diarrhea. He has no urinary complaints.  Patient offers no further specific complaints today.  REVIEW OF SYSTEMS:   Review of Systems  Constitutional:  Positive for malaise/fatigue. Negative for diaphoresis, fever and weight loss.  Respiratory: Negative.  Negative for cough and shortness of breath.   Cardiovascular: Negative.  Negative for chest pain and leg swelling.  Gastrointestinal: Negative.  Negative for abdominal pain, blood in stool and melena.  Genitourinary: Negative.  Negative for dysuria.  Musculoskeletal: Negative.  Negative for back pain.  Skin: Negative.  Negative for rash.  Neurological:  Positive for weakness. Negative for dizziness, sensory change, focal weakness and headaches.  Endo/Heme/Allergies:  Bruises/bleeds easily.  Psychiatric/Behavioral: Negative.  The patient is not nervous/anxious.     As per HPI. Otherwise, a complete review of systems is negative.  PAST MEDICAL HISTORY: Past Medical History:  Diagnosis Date   Anxiety    CKD (chronic kidney disease), stage III (HCC)    CLL (chronic lymphocytic leukemia) (HCC)     Diabetes mellitus without complication (HCC)    Diverticulitis 04/09/2017   History of kidney stones    HLD (hyperlipidemia)    HTN (hypertension)    PKD (polycystic kidney disease)    Sleep apnea    Uncontrolled type 2 diabetes mellitus with hyperglycemia (HCC) 06/28/2018    PAST SURGICAL HISTORY: Past Surgical History:  Procedure Laterality Date   CARDIAC CATHETERIZATION  12/1987   CENTRAL LINE INSERTION N/A 04/11/2017   Procedure: Central Line Insertion;  Surgeon: Renford Dills, MD;  Location: [facility] INVASIVE CV [facility];  Service: Cardiovascular;  Laterality: N/A;   CERVICAL FUSION     CHOLECYSTECTOMY     CYSTOSCOPY W/ RETROGRADES Left 05/12/2017   Procedure: CYSTOSCOPY WITH RETROGRADE PYELOGRAM;  Surgeon: Hildred Laser, MD;  Location: [facility] ORS;  Service: Urology;  Laterality: Left;   CYSTOSCOPY W/ URETERAL STENT PLACEMENT Left 05/12/2017   Procedure: CYSTOSCOPY WITH STENT REMOVAL;  Surgeon: Hildred Laser, MD;  Location: [facility] ORS;  Service: Urology;  Laterality: Left;   CYSTOSCOPY WITH STENT PLACEMENT Left 04/11/2017   Procedure: CYSTOSCOPY WITH STENT PLACEMENT;  Surgeon: Jerilee Field, MD;  Location: [facility] ORS;  Service: Urology;  Laterality: Left;   URETEROSCOPY  05/12/2017   Procedure: URETEROSCOPY;  Surgeon: Hildred Laser, MD;  Location: [facility] ORS;  Service: Urology;;    FAMILY HISTORY: Reported history of ovarian and lung cancer. Diabetes, hypertension.     ADVANCED DIRECTIVES:    HEALTH MAINTENANCE: Social History   Tobacco Use   Smoking status: Former    Types: Cigarettes    Passive exposure: Past   Smokeless tobacco: Never   Tobacco comments:    quit in Feb of 1997  Vaping  Use   Vaping status: Never Used  Substance Use Topics   Alcohol use: No   Drug use: No     Colonoscopy:  PAP:  Bone density:  Lipid panel:  Allergies  Allergen Reactions   Celecoxib Other (See Comments)    Increased Blood Pressure   Chlorpromazine Nausea Only    Iodinated Contrast Media Other (See Comments)    Stage 3 kidney disease, told IV dye would be bad for him   Prednisone Other (See Comments)    Diabetic   Amoxicillin-Pot Clavulanate Rash    Has patient had a PCN reaction causing immediate rash, facial/tongue/throat swelling, SOB or lightheadedness with hypotension: No Has patient had a PCN reaction causing severe rash involving mucus membranes or skin necrosis: No Has patient had a PCN reaction that required hospitalization: No Has patient had a PCN reaction occurring within the last 10 years: Yes If all of the above answers are "NO", then may proceed with Cephalosporin use.    Penicillin V Potassium Rash    Current Outpatient Medications  Medication Sig Dispense Refill   Aflibercept 2 MG/0.05ML SOLN Inject 1 Dose into the eye as directed. One injection into left eye every 8-9 weeks     atorvastatin (LIPITOR) 10 MG tablet Take 10 mg by mouth at bedtime.      BD PEN NEEDLE NANO 2ND GEN 32G X 4 MM MISC USE AS DIRECTED ONCE A DAY     carvedilol (COREG) 12.5 MG tablet Take 12.5 mg by mouth 2 (two) times daily with a meal.     Cholecalciferol (VITAMIN D-3) 5000 units TABS Take 5,000 Units by mouth at bedtime.      clonazePAM (KLONOPIN) 1 MG tablet Take 1 mg by mouth at bedtime.      CONTOUR NEXT TEST test strip daily.     cyanocobalamin 500 MCG tablet Take 1,000 mcg by mouth at bedtime.      empagliflozin (JARDIANCE) 10 MG TABS tablet 10 mg daily.     epoetin alfa-epbx (RETACRIT) 16109 UNIT/ML injection Inject 40,000 Units into the skin every 6 (six) weeks. Depending on lab results (if needed)     finasteride (PROSCAR) 5 MG tablet Take 1 tablet (5 mg total) by mouth daily. 90 tablet 3   folic acid (FOLVITE) 400 MCG tablet Take 400 mcg by mouth daily.     insulin glargine (LANTUS) 100 UNIT/ML injection Inject 20-30 Units into the skin every morning. Takes Lantus 20 units if CBG less than 120 mg/dl or Lantus 30 units if CBG is over 120 mg/dl      Multiple Vitamins-Minerals (PRESERVISION AREDS 2 PO) Take 1 capsule by mouth 2 (two) times daily.      omega-3 acid ethyl esters (LOVAZA) 1 g capsule Take by mouth 2 (two) times daily.     prednisoLONE acetate (PRED FORTE) 1 % ophthalmic suspension Place 1 drop into the right eye 4 (four) times daily.     sertraline (ZOLOFT) 50 MG tablet Take 100 mg by mouth daily.     tamsulosin (FLOMAX) 0.4 MG CAPS capsule TAKE 1 CAPSULE(0.4 MG) BY MOUTH DAILY AFTER SUPPER 90 capsule 3   telmisartan (MICARDIS) 40 MG tablet Take 40 mg by mouth daily.     torsemide (DEMADEX) 10 MG tablet Take by mouth.     triamcinolone (NASACORT) 55 MCG/ACT AERO nasal inhaler Place 2 sprays into the nose 2 (two) times daily as needed (allergies).      allopurinol (ZYLOPRIM) 100 MG tablet Take  100 mg by mouth daily. (Patient not taking: Reported on 01/08/2024)     fexofenadine (ALLEGRA) 180 MG tablet Take 180 mg by mouth daily as needed for allergies. (Patient not taking: Reported on 01/11/2024)     No current facility-administered medications for this visit.   Facility-Administered Medications Ordered in Other Visits  Medication Dose Route Frequency Provider Last Rate Last Admin   epoetin alfa-epbx (RETACRIT) injection 40,000 Units  40,000 Units Subcutaneous Once Jeralyn Ruths, MD        OBJECTIVE: Vitals:   01/11/24 1350  BP: (!) 122/59  Pulse: (!) 57  Temp: (!) 97.4 F (36.3 C)  SpO2: 100%       Body mass index is 31.73 kg/m.    ECOG FS:1 - Symptomatic but completely ambulatory  General: Well-developed, well-nourished, no acute distress. Eyes: Pink conjunctiva, anicteric sclera. HEENT: Normocephalic, moist mucous membranes. Lungs: No audible wheezing or coughing. Heart: Regular rate and rhythm. Abdomen: Soft, nontender, no obvious distention. Musculoskeletal: No edema, cyanosis, or clubbing. Neuro: Alert, answering all questions appropriately. Cranial nerves grossly intact. Skin: No rashes or petechiae  noted. Psych: Normal affect.  LAB RESULTS:  Lab Results  Component Value Date   NA 135 01/11/2024   K 4.7 01/11/2024   CL 110 01/11/2024   CO2 18 (L) 01/11/2024   GLUCOSE 153 (H) 01/11/2024   BUN 62 (H) 01/11/2024   CREATININE 3.23 (H) 01/11/2024   CALCIUM 7.5 (L) 01/11/2024   PROT 4.6 (L) 01/11/2024   ALBUMIN 2.8 (L) 01/11/2024   AST 16 01/11/2024   ALT 18 01/11/2024   ALKPHOS 134 (H) 01/11/2024   BILITOT 0.8 01/11/2024   GFRNONAA 18 (L) 01/11/2024   GFRAA 37 (L) 11/19/2019    Lab Results  Component Value Date   WBC 7.3 01/11/2024   NEUTROABS 1.8 01/11/2024   HGB 7.5 (L) 01/11/2024   HCT 24.3 (L) 01/11/2024   MCV 104.7 (H) 01/11/2024   PLT 27 (L) 01/11/2024   Lab Results  Component Value Date   IRON 79 01/11/2024   TIBC 304 01/11/2024   IRONPCTSAT 26 01/11/2024   Lab Results  Component Value Date   FERRITIN 142 01/11/2024     STUDIES: CT BONE MARROW BIOPSY & ASPIRATION Result Date: 01/08/2024 CLINICAL DATA:  History of chronic lymphocytic leukemia with refractory anemia and thrombocytopenia. Bone marrow biopsy needed for further evaluation. EXAM: CT GUIDED BONE MARROW ASPIRATION AND BIOPSY ANESTHESIA/SEDATION: Moderate (conscious) sedation was employed during this procedure. A total of Versed 1.0 mg and Fentanyl 50 mcg was administered intravenously. Moderate Sedation Time: 10 minutes. The patient's level of consciousness and vital signs were monitored continuously by radiology nursing throughout the procedure under my direct supervision. PROCEDURE: The procedure risks, benefits, and alternatives were explained to the patient. Questions regarding the procedure were encouraged and answered. The patient understands and consents to the procedure. A time out was performed prior to initiating the procedure. The right gluteal region was prepped with chlorhexidine. Sterile gown and sterile gloves were used for the procedure. Local anesthesia was provided with 1% Lidocaine.  Under CT guidance, an 11 gauge On Control bone cutting needle was advanced from a posterior approach into the right iliac bone. Needle positioning was confirmed with CT. Initial non heparinized and heparinized aspirate samples were obtained of bone marrow. Core biopsy was performed via the On Control drill needle. COMPLICATIONS: None FINDINGS: Initial aspirate samples were obtained. Two separate intact core biopsy samples were obtained. IMPRESSION: CT guided bone marrow biopsy  of right posterior iliac bone with both aspirate and core samples obtained. Electronically Signed   By: Irish Lack M.D.   On: 01/08/2024 10:17    ASSESSMENT: CLL, anemia/thrombocytopenia.  PLAN:    CLL: Bone marrow biopsy on January 08, 2024 reported 69% clonal B-cell lymphocytes consistent with a diagnosis of CLL.  Patient does not have any peripheral lymphocytosis.  No other pathology was reported.  It is possible patient's chronic thrombocytopenia and worsening anemia is related to his CLL, therefore patient was initiated on dose reduced Imbruvica 280 mg daily.  Return to clinic in 1 week for further evaluation and continuation of treatment.  Appreciate clinical pharmacy input.   Anemia, unspecified: Possibly related to progression of CLL, but we do not have a previous bone marrow biopsy for comparison.  Patient's hemoglobin has declined to 7.5 and he remains symptomatic.  Return to clinic tomorrow for 1 unit packed red blood cells.  All blood products should be irradiated. Thrombocytopenia: Chronic and unchanged.  Patient's platelet count is 27.  Bone marrow biopsy as above.  Previously, patient required multiple transfusions of platelets prior to his oral surgery.  He did not have any excessive bleeding during his surgery.  His count was as high as 95 in June 2016.  He does not require transfusion today.  Return to clinic as scheduled.  Lymphadenopathy: CT scan result from December 26, 2023 reviewed independently with no obvious  lymphadenopathy. Aortic aneurysm: Appreciate vascular surgery input.  Patient elected to hold off any procedures until his aneurysm reaches 5.5 cm. Dental surgery: Successful and resolved. Renal insufficiency: Chronic and unchanged.  Patient's most recent GFR is 18.  Continue monitoring and treatment per nephrology.   Patient expressed understanding and was in agreement with this plan. He also understands that He can call clinic at any time with any questions, concerns, or complaints.    Cancer Staging  Chronic lymphocytic leukemia (HCC) Staging form: Chronic Lymphocytic Leukemia / Small Lymphocytic Lymphoma, AJCC 8th Edition - Clinical stage from 01/12/2024: Modified Rai Stage IV (Modified Rai risk: High, Lymphocytosis: Absent, Adenopathy: Absent, Organomegaly: Absent, Anemia: Present, Thrombocytopenia: Present) - Signed by Jeralyn Ruths, MD on 01/13/2024    Jeralyn Ruths, MD   01/13/2024 9:37 AM

## 2024-01-15 ENCOUNTER — Other Ambulatory Visit: Payer: Self-pay

## 2024-01-15 ENCOUNTER — Telehealth: Payer: Self-pay | Admitting: Pharmacy Technician

## 2024-01-15 ENCOUNTER — Encounter: Payer: Self-pay | Admitting: Oncology

## 2024-01-15 ENCOUNTER — Other Ambulatory Visit (HOSPITAL_COMMUNITY): Payer: Self-pay

## 2024-01-15 MED ORDER — IBRUTINIB 280 MG PO TABS
ORAL_TABLET | ORAL | 0 refills | Status: DC
  Filled 2024-01-15: qty 28, 28d supply, fill #0

## 2024-01-15 NOTE — Telephone Encounter (Signed)
 Oral Oncology Patient Advocate Encounter  Prior Authorization for Maurine Simmering has been approved.    PA# [id number] Effective dates: 01/15/2024 through 10/09/2024  Patients co-pay is $53.48.    [name], CPhT Oncology Pharmacy Patient Advocate [facility] Direct Number: [phone] Fax: [phone]

## 2024-01-15 NOTE — Telephone Encounter (Signed)
 Oral Oncology Patient Advocate Encounter   Was successful in securing patient an $75 grant from [employer] [facility]) to provide copayment coverage for Imbruvica.  This will keep the out of pocket expense at $0.     The billing information is as follows and has been shared with [facility].   Member ID: [id number] Group ID: [id number] RxBin: [id number] Dates of Eligibility: 10/12/2023 through 01/08/2025  Fund:  Chronic Lymphocytic Leukemia  [name], CPhT Oncology Pharmacy Patient Advocate [facility] Direct Number: [phone] Fax: [phone]

## 2024-01-16 ENCOUNTER — Ambulatory Visit: Payer: Medicare Other

## 2024-01-16 ENCOUNTER — Other Ambulatory Visit: Payer: Self-pay | Admitting: Pharmacy Technician

## 2024-01-16 ENCOUNTER — Other Ambulatory Visit: Payer: Self-pay

## 2024-01-16 ENCOUNTER — Telehealth: Payer: Self-pay | Admitting: Pharmacist

## 2024-01-16 ENCOUNTER — Other Ambulatory Visit (HOSPITAL_COMMUNITY): Payer: Self-pay

## 2024-01-16 ENCOUNTER — Other Ambulatory Visit: Payer: Medicare Other

## 2024-01-16 DIAGNOSIS — C9110 Chronic lymphocytic leukemia of B-cell type not having achieved remission: Secondary | ICD-10-CM

## 2024-01-16 MED ORDER — IBRUTINIB 280 MG PO TABS
280.0000 mg | ORAL_TABLET | Freq: Every day | ORAL | 2 refills | Status: DC
Start: 2024-01-16 — End: 2024-02-29
  Filled 2024-01-16: qty 28, 28d supply, fill #0
  Filled 2024-02-08: qty 28, 28d supply, fill #1

## 2024-01-16 NOTE — Telephone Encounter (Signed)
 Clinical Pharmacist Practitioner Encounter   Received new prescription for Imbruvica (ibrutinib) for the treatment of CLL, planned duration until disease progression or unacceptable drug toxicity.  CMP from 01/11/24 assessed, no relevant lab abnormalities. Prescription dose and frequency assessed.   Current medication list in Epic reviewed, one DDIs with ibrutinib identified: Sertraline: Agents with Antiplatelet Effects may enhance the adverse/toxic effect of Ibrutinib. Monitor for signs and symptoms of bleeding  Evaluated chart and no patient barriers to medication adherence identified.   Prescription has been e-scribed to the [facility] for benefits analysis and approval.  Oral Oncology Clinic will continue to follow for insurance authorization, copayment issues, initial counseling and start date.  Patient agreed to treatment on 01/11/24 per MD documentation.  Remi Haggard, PharmD, BCOP, CPP Hematology/Oncology Clinical Pharmacist [facility] Oral Chemotherapy Navigation Clinic [phone]  01/16/2024 11:26 AM

## 2024-01-16 NOTE — Progress Notes (Signed)
 Specialty Pharmacy Initiation Note   Patrick Payne. is a 89 y.o. male who will be followed by the specialty pharmacy service for RxSp Oncology    Review of administration, indication, effectiveness, safety, potential side effects, storage/disposable, and missed dose instructions occurred today for patient's specialty medication(s) Ibrutinib [facility])     Patient/Caregiver did not have any additional questions or concerns.   Patient's therapy is appropriate to: Initiate    Goals Addressed             This Visit's Progress    Achieve or maintain remission       Patient is initiating therapy. Patient will maintain adherence         Remi Haggard Specialty Pharmacist

## 2024-01-16 NOTE — Progress Notes (Signed)
 Specialty Pharmacy Initial Fill Coordination Note  Patrick Payne. is a 89 y.o. male contacted today regarding refills of specialty medication(s) Ibrutinib (IMBRUVICA) .  Patient requested Delivery  on 01/18/24  to verified address [address] [facility] [place] [address]   Medication will be filled on 01/17/2024.   Patient is aware of $0 copayment. Radio producer.  [name], CPhT Oncology Pharmacy Patient Advocate [facility] Direct Number: [phone] Fax: [phone]

## 2024-01-17 ENCOUNTER — Encounter (HOSPITAL_COMMUNITY): Payer: Self-pay | Admitting: Oncology

## 2024-01-18 ENCOUNTER — Ambulatory Visit: Payer: Medicare Other

## 2024-01-18 ENCOUNTER — Encounter: Payer: Self-pay | Admitting: Oncology

## 2024-01-18 ENCOUNTER — Inpatient Hospital Stay: Payer: Medicare Other

## 2024-01-18 ENCOUNTER — Inpatient Hospital Stay: Admitting: Pharmacist

## 2024-01-18 ENCOUNTER — Other Ambulatory Visit: Payer: Self-pay

## 2024-01-18 ENCOUNTER — Inpatient Hospital Stay (HOSPITAL_BASED_OUTPATIENT_CLINIC_OR_DEPARTMENT_OTHER): Payer: Medicare Other | Admitting: Oncology

## 2024-01-18 VITALS — BP 120/60 | HR 53 | Resp 16 | Ht 65.0 in | Wt 190.9 lb

## 2024-01-18 DIAGNOSIS — C9110 Chronic lymphocytic leukemia of B-cell type not having achieved remission: Secondary | ICD-10-CM | POA: Diagnosis not present

## 2024-01-18 DIAGNOSIS — D631 Anemia in chronic kidney disease: Secondary | ICD-10-CM

## 2024-01-18 DIAGNOSIS — D696 Thrombocytopenia, unspecified: Secondary | ICD-10-CM

## 2024-01-18 LAB — CMP (CANCER CENTER ONLY)
ALT: 21 U/L (ref 0–44)
AST: 17 U/L (ref 15–41)
Albumin: 2.7 g/dL — ABNORMAL LOW (ref 3.5–5.0)
Alkaline Phosphatase: 122 U/L (ref 38–126)
Anion gap: 7 (ref 5–15)
BUN: 71 mg/dL — ABNORMAL HIGH (ref 8–23)
CO2: 20 mmol/L — ABNORMAL LOW (ref 22–32)
Calcium: 7.8 mg/dL — ABNORMAL LOW (ref 8.9–10.3)
Chloride: 110 mmol/L (ref 98–111)
Creatinine: 3.38 mg/dL — ABNORMAL HIGH (ref 0.61–1.24)
GFR, Estimated: 17 mL/min — ABNORMAL LOW (ref >60)
Glucose, Bld: 171 mg/dL — ABNORMAL HIGH (ref 70–99)
Potassium: 5.1 mmol/L (ref 3.5–5.1)
Sodium: 137 mmol/L (ref 135–145)
Total Bilirubin: 0.9 mg/dL (ref 0.0–1.2)
Total Protein: 4.6 g/dL — ABNORMAL LOW (ref 6.5–8.1)

## 2024-01-18 LAB — CBC WITH DIFFERENTIAL (CANCER CENTER ONLY)
Abs Immature Granulocytes: 0.01 10*3/uL (ref 0.00–0.07)
Basophils Absolute: 0 10*3/uL (ref 0.0–0.1)
Basophils Relative: 0 %
Eosinophils Absolute: 0.1 10*3/uL (ref 0.0–0.5)
Eosinophils Relative: 2 %
HCT: 25.8 % — ABNORMAL LOW (ref 39.0–52.0)
Hemoglobin: 8.1 g/dL — ABNORMAL LOW (ref 13.0–17.0)
Immature Granulocytes: 0 %
Lymphocytes Relative: 70 %
Lymphs Abs: 4.6 10*3/uL — ABNORMAL HIGH (ref 0.7–4.0)
MCH: 32.5 pg (ref 26.0–34.0)
MCHC: 31.4 g/dL (ref 30.0–36.0)
MCV: 103.6 fL — ABNORMAL HIGH (ref 80.0–100.0)
Monocytes Absolute: 0.3 10*3/uL (ref 0.1–1.0)
Monocytes Relative: 4 %
Neutro Abs: 1.6 10*3/uL — ABNORMAL LOW (ref 1.7–7.7)
Neutrophils Relative %: 24 %
Platelet Count: 25 10*3/uL — ABNORMAL LOW (ref 150–400)
RBC: 2.49 MIL/uL — ABNORMAL LOW (ref 4.22–5.81)
RDW: 17.4 % — ABNORMAL HIGH (ref 11.5–15.5)
WBC Count: 6.6 10*3/uL (ref 4.0–10.5)
nRBC: 0 % (ref 0.0–0.2)

## 2024-01-18 LAB — PREPARE RBC (CROSSMATCH)

## 2024-01-18 MED ORDER — DIPHENOXYLATE-ATROPINE 2.5-0.025 MG PO TABS: 1.0000 | ORAL_TABLET | Freq: Four times a day (QID) | ORAL | 1 refills | Status: DC | PRN

## 2024-01-18 NOTE — Progress Notes (Signed)
 [facility]  Telephone:([phone] [phone]  ID: Patrick Catholic Dress Jr. OB: [birth year]  MR#: [id number]  AOZ#:[id number]  Patient Care Team: Marguarite Arbour, MD as PCP - General (Internal Medicine) Jeralyn Ruths, MD as Consulting Physician (Oncology)  CHIEF COMPLAINT: CLL, anemia/thrombocytopenia.  INTERVAL HISTORY: Patient returns to clinic today for further evaluation, laboratory work, initiation of Imbruvica, and consideration of additional blood.  He continues to have weakness and fatigue but this is mildly improved after receiving a transfusion last week.  He also admits to occasional night sweats. He continues to have easy bruising as well. He has no neurologic complaints today.  He denies any chest pain, shortness of breath, cough, or hemoptysis.  He denies any nausea, vomiting, constipation, or diarrhea. He has no urinary complaints.  Patient offers no further specific complaints today.  REVIEW OF SYSTEMS:   Review of Systems  Constitutional:  Positive for malaise/fatigue. Negative for diaphoresis, fever and weight loss.  Respiratory: Negative.  Negative for cough and shortness of breath.   Cardiovascular: Negative.  Negative for chest pain and leg swelling.  Gastrointestinal: Negative.  Negative for abdominal pain, blood in stool and melena.  Genitourinary: Negative.  Negative for dysuria.  Musculoskeletal: Negative.  Negative for back pain.  Skin: Negative.  Negative for rash.  Neurological:  Positive for weakness. Negative for dizziness, sensory change, focal weakness and headaches.  Endo/Heme/Allergies:  Bruises/bleeds easily.  Psychiatric/Behavioral: Negative.  The patient is not nervous/anxious.     As per HPI. Otherwise, a complete review of systems is negative.  PAST MEDICAL HISTORY: Past Medical History:  Diagnosis Date   Anxiety    CKD (chronic kidney disease), stage III (HCC)    CLL (chronic lymphocytic leukemia) (HCC)     Diabetes mellitus without complication (HCC)    Diverticulitis 04/09/2017   History of kidney stones    HLD (hyperlipidemia)    HTN (hypertension)    PKD (polycystic kidney disease)    Sleep apnea    Uncontrolled type 2 diabetes mellitus with hyperglycemia (HCC) 06/28/2018    PAST SURGICAL HISTORY: Past Surgical History:  Procedure Laterality Date   CARDIAC CATHETERIZATION  12/1987   CENTRAL LINE INSERTION N/A 04/11/2017   Procedure: Central Line Insertion;  Surgeon: Renford Dills, MD;  Location: [facility];  Service: Cardiovascular;  Laterality: N/A;   CERVICAL FUSION     CHOLECYSTECTOMY     CYSTOSCOPY W/ RETROGRADES Left 05/12/2017   Procedure: CYSTOSCOPY WITH RETROGRADE PYELOGRAM;  Surgeon: Hildred Laser, MD;  Location: [facility] OR[facility];  Service: Urology;  Laterality: Left;   CYSTOSCOPY W/ URETERAL STENT PLACEMENT Left 05/12/2017   Procedure: CYSTOSCOPY WITH STENT REMOVAL;  Surgeon: Hildred Laser, MD;  Location: [facility] ORS;  Service: Urology;  Laterality: Left;   CYSTOSCOPY WITH STENT PLACEMENT Left 04/11/2017   Procedure: CYSTOSCOPY WITH STENT PLACEMENT;  Surgeon: Jerilee Field, MD;  Location: [facility] ORS;  Service: Urology;  Laterality: Left;   URETEROSCOPY  05/12/2017   Procedure: URETEROSCOPY;  Surgeon: Hildred Laser, MD;  Location: [facility] OR[facility];  Service: Urology;;    FAMILY HISTORY: Reported history of ovarian and lung cancer. Diabetes, hypertension.     ADVANCED DIRECTIVES:    HEALTH MAINTENANCE: Social History   Tobacco Use   Smoking status: Former    Types: Cigarettes    Passive exposure: Past   Smokeless tobacco: Never   Tobacco comments:    quit in Feb of 1997  Vaping Use   Vaping status:  Never Used  Substance Use Topics   Alcohol use: No   Drug use: No     Colonoscopy:  PAP:  Bone density:  Lipid panel:  Allergies  Allergen Reactions   Celecoxib Other (See Comments)    Increased Blood Pressure   Chlorpromazine Nausea Only    Iodinated Contrast Media Other (See Comments)    Stage 3 kidney disease, told IV dye would be bad for him   Prednisone Other (See Comments)    Diabetic   Amoxicillin-Pot Clavulanate Rash    Has patient had a PCN reaction causing immediate rash, facial/tongue/throat swelling, SOB or lightheadedness with hypotension: No Has patient had a PCN reaction causing severe rash involving mucus membranes or skin necrosis: No Has patient had a PCN reaction that required hospitalization: No Has patient had a PCN reaction occurring within the last 10 years: Yes If all of the above answers are "NO", then may proceed with Cephalosporin use.    Penicillin V Potassium Rash    Current Outpatient Medications  Medication Sig Dispense Refill   Aflibercept 2 MG/0.05ML SOLN Inject 1 Dose into the eye as directed. One injection into left eye every 8-9 weeks     atorvastatin (LIPITOR) 10 MG tablet Take 10 mg by mouth at bedtime.      BD PEN NEEDLE NANO 2ND GEN 32G X 4 MM MISC USE AS DIRECTED ONCE A DAY     carvedilol (COREG) 12.5 MG tablet Take 12.5 mg by mouth 2 (two) times daily with a meal.     Cholecalciferol (VITAMIN D-3) 5000 units TABS Take 5,000 Units by mouth at bedtime.      clonazePAM (KLONOPIN) 1 MG tablet Take 1 mg by mouth at bedtime.      CONTOUR NEXT TEST test strip daily.     cyanocobalamin 500 MCG tablet Take 1,000 mcg by mouth at bedtime.      empagliflozin (JARDIANCE) 10 MG TABS tablet 10 mg daily.     epoetin alfa-epbx (RETACRIT) 95638 UNIT/ML injection Inject 40,000 Units into the skin every 6 (six) weeks. Depending on lab results (if needed)     fexofenadine (ALLEGRA) 180 MG tablet Take 180 mg by mouth daily as needed for allergies.     finasteride (PROSCAR) 5 MG tablet Take 1 tablet (5 mg total) by mouth daily. 90 tablet 3   folic acid (FOLVITE) 400 MCG tablet Take 400 mcg by mouth daily.     insulin glargine (LANTUS) 100 UNIT/ML injection Inject 20-30 Units into the skin every morning.  Takes Lantus 20 units if CBG less than 120 mg/dl or Lantus 30 units if CBG is over 120 mg/dl     Multiple Vitamins-Minerals (PRESERVISION AREDS 2 PO) Take 1 capsule by mouth 2 (two) times daily.      prednisoLONE acetate (PRED FORTE) 1 % ophthalmic suspension Place 1 drop into the right eye 4 (four) times daily.     sertraline (ZOLOFT) 50 MG tablet Take 100 mg by mouth daily.     tamsulosin (FLOMAX) 0.4 MG CAPS capsule TAKE 1 CAPSULE(0.4 MG) BY MOUTH DAILY AFTER SUPPER 90 capsule 3   telmisartan (MICARDIS) 40 MG tablet Take 40 mg by mouth daily.     torsemide (DEMADEX) 10 MG tablet Take by mouth.     triamcinolone (NASACORT) 55 MCG/ACT AERO nasal inhaler Place 2 sprays into the nose 2 (two) times daily as needed (allergies).      allopurinol (ZYLOPRIM) 100 MG tablet Take 100 mg by mouth daily. (  Patient not taking: Reported on 01/08/2024)     ibrutinib (IMBRUVICA) 280 MG tablet Take 1 tablet (280 mg total) by mouth daily. Take with a glass of water. (Patient not taking: Reported on 01/18/2024) 28 tablet 2   No current facility-administered medications for this visit.   Facility-Administered Medications Ordered in Other Visits  Medication Dose Route Frequency Provider Last Rate Last Admin   epoetin alfa-epbx (RETACRIT) injection 40,000 Units  40,000 Units Subcutaneous Once Jeralyn Ruths, MD        OBJECTIVE: Vitals:   01/18/24 1402  BP: 120/60  Pulse: (!) 53  Resp: 16  SpO2: 100%      Body mass index is 31.77 kg/m.    ECOG FS:1 - Symptomatic but completely ambulatory  General: Well-developed, well-nourished, no acute distress. Eyes: Pink conjunctiva, anicteric sclera. HEENT: Normocephalic, moist mucous membranes. Lungs: No audible wheezing or coughing. Heart: Regular rate and rhythm. Abdomen: Soft, nontender, no obvious distention. Musculoskeletal: No edema, cyanosis, or clubbing. Neuro: Alert, answering all questions appropriately. Cranial nerves grossly intact. 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   Psych: Normal affect.   LAB RESULTS:  Lab Results  Component Value Date   NA 137 01/18/2024   K 5.1 01/18/2024   CL 110 01/18/2024   CO2 20 (L) 01/18/2024   GLUCOSE 171 (H) 01/18/2024   BUN 71 (H) 01/18/2024   CREATININE 3.38 (H) 01/18/2024   CALCIUM 7.8 (L) 01/18/2024   PROT 4.6 (L) 01/18/2024   ALBUMIN 2.7 (L) 01/18/2024   AST 17 01/18/2024   ALT 21 01/18/2024   ALKPHOS 122 01/18/2024   BILITOT 0.9 01/18/2024   GFRNONAA 17 (L) 01/18/2024   GFRAA 37 (L) 11/19/2019    Lab Results  Component Value Date   WBC 6.6 01/18/2024   NEUTROABS 1.6 (L) 01/18/2024   HGB 8.1 (L) 01/18/2024   HCT 25.8 (L) 01/18/2024   MCV 103.6 (H) 01/18/2024   PLT 25 (L) 01/18/2024   Lab Results  Component Value Date   IRON 79 01/11/2024   TIBC 304 01/11/2024   IRONPCTSAT 26 01/11/2024   Lab Results  Component Value Date   FERRITIN 142 01/11/2024     STUDIES: CT BONE MARROW BIOPSY & ASPIRATION Result Date: 01/08/2024 CLINICAL DATA:  History of chronic lymphocytic leukemia with refractory anemia and thrombocytopenia. Bone marrow biopsy needed for further evaluation. EXAM: CT GUIDED BONE MARROW ASPIRATION AND BIOPSY ANESTHESIA/SEDATION: Moderate (conscious) sedation was employed during this procedure. A total of Versed 1.0 mg and Fentanyl 50 mcg was administered intravenously. Moderate Sedation Time: 10 minutes. The patient's level of consciousness and vital signs were monitored continuously by radiology nursing throughout the procedure under my direct supervision. PROCEDURE: The procedure risks, benefits, and alternatives were explained to the patient. Questions regarding the procedure were encouraged and answered. The patient understands and consents to the procedure. A time out was performed prior to initiating the procedure. The right gluteal region was prepped with chlorhexidine. Sterile gown and sterile gloves were used for the  procedure. Local anesthesia was provided with 1% Lidocaine. Under CT guidance, an 11 gauge On Control bone cutting needle was advanced from a posterior approach into the right iliac bone. Needle positioning was confirmed with CT. Initial non heparinized and heparinized aspirate samples were obtained of bone marrow. Core biopsy was performed via the On Control drill needle. COMPLICATIONS: None FINDINGS: Initial aspirate samples were obtained. Two separate intact core biopsy samples were obtained. IMPRESSION: CT guided bone  marrow biopsy of right posterior iliac bone with both aspirate and core samples obtained. Electronically Signed   By: Irish Lack M.D.   On: 01/08/2024 10:17    ASSESSMENT: CLL, anemia/thrombocytopenia.  PLAN:    CLL: Bone marrow biopsy on January 08, 2024 reported 69% clonal B-cell lymphocytes consistent with a diagnosis of CLL.  Patient does not have any peripheral lymphocytosis.  No other pathology was reported.  It is possible patient's chronic thrombocytopenia, worsening anemia, night sweats are related to his CLL, therefore patient was initiated on dose reduced Imbruvica 280 mg daily today.  Return to clinic in 1 week for laboratory work, further evaluation, and continuation of treatment.  Appreciate clinical pharmacy input.   Anemia, unspecified: Possibly related to progression of CLL, but we do not have a previous bone marrow biopsy for comparison.  Patient seems low but is mildly improved 8.1 with transfusion last week.  Return to clinic tomorrow for additional 1 unit of packed red blood cells.  All blood products should be irradiated. Thrombocytopenia: Chronic and unchanged.  Patient's platelet count is 25 today.  Bone marrow biopsy as above.  Previously, patient required multiple transfusions of platelets prior to his oral surgery.  He did not have any excessive bleeding during his surgery.  His count was as high as 95 in June 2016.  He does not require transfusion today.   Return to clinic as scheduled.  Lymphadenopathy: CT scan result from December 26, 2023 reviewed independently with no obvious lymphadenopathy. Aortic aneurysm: Appreciate vascular surgery input.  Patient elected to hold off any procedures until his aneurysm reaches 5.5 cm. Dental surgery: Successful and resolved. Renal insufficiency: Chronic and unchanged.  Patient's most recent GFR is 17.  Continue monitoring and treatment per nephrology.   Patient expressed understanding and was in agreement with this plan. He also understands that He can call clinic at any time with any questions, concerns, or complaints.    Cancer Staging  Chronic lymphocytic leukemia (HCC) Staging form: Chronic Lymphocytic Leukemia / Small Lymphocytic Lymphoma, AJCC 8th Edition - Clinical stage from 01/12/2024: Modified Rai Stage IV (Modified Rai risk: High, Lymphocytosis: Absent, Adenopathy: Absent, Organomegaly: Absent, Anemia: Present, Thrombocytopenia: Present) - Signed by Jeralyn Ruths, MD on 01/13/2024    Jeralyn Ruths, MD   01/18/2024 3:40 PM

## 2024-01-18 NOTE — Progress Notes (Signed)
 Clinical Pharmacist Practitioner Clinic [facility]  [phone] [phone]  Patient Care Team: Marguarite Arbour, MD as PCP - General (Internal Medicine) Jeralyn Ruths, MD as Consulting Physician (Oncology)   Name of the patient: Patrick Payne  [id number]  05-Sep-1934   Date of visit: 01/18/24  HPI: Patient is a 89 y.o. male with CLL. Patient will start ibrutinib today 01/18/24.   Reason for Consult: Ibrutinib oral chemotherapy education.   PAST MEDICAL HISTORY: Past Medical History:  Diagnosis Date   Anxiety    CKD (chronic kidney disease), stage III (HCC)    CLL (chronic lymphocytic leukemia) (HCC)    Diabetes mellitus without complication (HCC)    Diverticulitis 04/09/2017   History of kidney stones    HLD (hyperlipidemia)    HTN (hypertension)    PKD (polycystic kidney disease)    Sleep apnea    Uncontrolled type 2 diabetes mellitus with hyperglycemia (HCC) 06/28/2018    HEMATOLOGY/ONCOLOGY HISTORY:  Oncology History  Chronic lymphocytic leukemia (HCC)  04/17/2014 Initial Diagnosis   Chronic lymphocytic leukemia (HCC)   01/12/2024 Cancer Staging   Staging form: Chronic Lymphocytic Leukemia / Small Lymphocytic Lymphoma, AJCC 8th Edition - Clinical stage from 01/12/2024: Modified Rai Stage IV (Modified Rai risk: High, Lymphocytosis: Absent, Adenopathy: Absent, Organomegaly: Absent, Anemia: Present, Thrombocytopenia: Present) - Signed by Jeralyn Ruths, MD on 01/13/2024     ALLERGIES:  is allergic to celecoxib, chlorpromazine, iodinated contrast media, prednisone, amoxicillin-pot clavulanate, and penicillin v potassium.  MEDICATIONS:  Current Outpatient Medications  Medication Sig Dispense Refill   Aflibercept 2 MG/0.05ML SOLN Inject 1 Dose into the eye as directed. One injection into left eye every 8-9 weeks     allopurinol (ZYLOPRIM) 100 MG tablet Take 100 mg by mouth daily. (Patient not taking: Reported on 01/08/2024)      atorvastatin (LIPITOR) 10 MG tablet Take 10 mg by mouth at bedtime.      BD PEN NEEDLE NANO 2ND GEN 32G X 4 MM MISC USE AS DIRECTED ONCE A DAY     carvedilol (COREG) 12.5 MG tablet Take 12.5 mg by mouth 2 (two) times daily with a meal.     Cholecalciferol (VITAMIN D-3) 5000 units TABS Take 5,000 Units by mouth at bedtime.      clonazePAM (KLONOPIN) 1 MG tablet Take 1 mg by mouth at bedtime.      CONTOUR NEXT TEST test strip daily.     cyanocobalamin 500 MCG tablet Take 1,000 mcg by mouth at bedtime.      diphenoxylate-atropine (LOMOTIL) 2.5-0.025 MG tablet Take 1 tablet by mouth 4 (four) times daily as needed for diarrhea or loose stools. 60 tablet 1   empagliflozin (JARDIANCE) 10 MG TABS tablet 10 mg daily.     epoetin alfa-epbx (RETACRIT) 62130 UNIT/ML injection Inject 40,000 Units into the skin every 6 (six) weeks. Depending on lab results (if needed)     fexofenadine (ALLEGRA) 180 MG tablet Take 180 mg by mouth daily as needed for allergies.     finasteride (PROSCAR) 5 MG tablet Take 1 tablet (5 mg total) by mouth daily. 90 tablet 3   folic acid (FOLVITE) 400 MCG tablet Take 400 mcg by mouth daily.     ibrutinib (IMBRUVICA) 280 MG tablet Take 1 tablet (280 mg total) by mouth daily. Take with a glass of water. (Patient not taking: Reported on 01/18/2024) 28 tablet 2   insulin glargine (LANTUS) 100 UNIT/ML injection Inject 20-30 Units into the skin every  morning. Takes Lantus 20 units if CBG less than 120 mg/dl or Lantus 30 units if CBG is over 120 mg/dl     Multiple Vitamins-Minerals (PRESERVISION AREDS 2 PO) Take 1 capsule by mouth 2 (two) times daily.      prednisoLONE acetate (PRED FORTE) 1 % ophthalmic suspension Place 1 drop into the right eye 4 (four) times daily.     sertraline (ZOLOFT) 50 MG tablet Take 100 mg by mouth daily.     tamsulosin (FLOMAX) 0.4 MG CAPS capsule TAKE 1 CAPSULE(0.4 MG) BY MOUTH DAILY AFTER SUPPER 90 capsule 3   telmisartan (MICARDIS) 40 MG tablet Take 40 mg by  mouth daily.     torsemide (DEMADEX) 10 MG tablet Take by mouth.     triamcinolone (NASACORT) 55 MCG/ACT AERO nasal inhaler Place 2 sprays into the nose 2 (two) times daily as needed (allergies).      No current facility-administered medications for this visit.   Facility-Administered Medications Ordered in Other Visits  Medication Dose Route Frequency Provider Last Rate Last Admin   epoetin alfa-epbx (RETACRIT) injection 40,000 Units  40,000 Units Subcutaneous Once Jeralyn Ruths, MD        VITAL SIGNS: There were no vitals taken for this visit. There were no vitals filed for this visit.  Estimated body mass index is 31.77 kg/m as calculated from the following:   Height as of an earlier encounter on 01/18/24: 5\' 5"  (1.651 m).   Weight as of an earlier encounter on 01/18/24: 86.6 kg (190 lb 14.4 oz).  LABS: CBC:    Component Value Date/Time   WBC 6.6 01/18/2024 1341   WBC 8.2 01/08/2024 0752   HGB 8.1 (L) 01/18/2024 1341   HGB 12.1 (L) 02/21/2014 1340   HCT 25.8 (L) 01/18/2024 1341   HCT 36.2 (L) 02/21/2014 1340   PLT 25 (L) 01/18/2024 1341   PLT 76 (L) 02/21/2014 1340   MCV 103.6 (H) 01/18/2024 1341   MCV 98 02/21/2014 1340   NEUTROABS 1.6 (L) 01/18/2024 1341   NEUTROABS 2.7 02/21/2014 1340   LYMPHSABS 4.6 (H) 01/18/2024 1341   LYMPHSABS 16.9 (H) 02/21/2014 1340   MONOABS 0.3 01/18/2024 1341   MONOABS 0.4 02/21/2014 1340   EOSABS 0.1 01/18/2024 1341   EOSABS 0.3 02/21/2014 1340   BASOSABS 0.0 01/18/2024 1341   BASOSABS 0.1 02/21/2014 1340   Comprehensive Metabolic Panel:    Component Value Date/Time   NA 137 01/18/2024 1341   NA 138 06/09/2017 1110   K 5.1 01/18/2024 1341   CL 110 01/18/2024 1341   CO2 20 (L) 01/18/2024 1341   BUN 71 (H) 01/18/2024 1341   BUN 26 06/09/2017 1110   CREATININE 3.38 (H) 01/18/2024 1341   CREATININE 1.57 (H) 02/21/2014 1340   GLUCOSE 171 (H) 01/18/2024 1341   CALCIUM 7.8 (L) 01/18/2024 1341   AST 17 01/18/2024 1341   ALT 21  01/18/2024 1341   ALKPHOS 122 01/18/2024 1341   BILITOT 0.9 01/18/2024 1341   PROT 4.6 (L) 01/18/2024 1341   ALBUMIN 2.7 (L) 01/18/2024 1341     Present during today's visit: patient and his wife  Start plan: pt to start today 01/18/24   Patient Education I spoke with patient for overview of new oral chemotherapy medication: ibrutinib   Administration: Counseled patient on administration, dosing, side effects, monitoring, drug-food interactions, safe handling, storage, and disposal. Patient will take 1 tablet (280 mg total) by mouth daily. Take with a glass of water. Marland Kitchen  Side Effects: Side effects include but not limited to: diarrhea, decreased plt/wbc/hgh, fatigue, nausea, edema.    Adherence: After discussion with patient no patient barriers to medication adherence identified.  Reviewed with patient importance of keeping a medication schedule and plan for any missed doses.  Mr. Lard voiced understanding and appreciation. All questions answered. Medication handout provided.  Provided patient with Oral Chemotherapy Navigation Clinic phone number. Patient knows to call the office with questions or concerns. Oral Chemotherapy Navigation Clinic will continue to follow.  Patient expressed understanding and was in agreement with this plan. He also understands that He can call clinic at any time with any questions, concerns, or complaints.   Medication Access Issues: No issues, patient filling at [facility] (Special[facility])  Follow-up plan: RTC as scheduled  Thank you for allowing me to participate in the care of this patient.   Time Total: 15 mins  Visit consisted of counseling and education on dealing with issues of symptom management in the setting of serious and potentially life-threatening illness.Greater than 50%  of this time was spent counseling and coordinating care related to the above assessment and plan.  Signed by: Remi Haggard, PharmD, Nolon Bussing,  CPP Hematology/Oncology Clinical Pharmacist Practitioner [place] [phone]  01/18/2024 4:45 PM

## 2024-01-18 NOTE — Progress Notes (Signed)
 Having night sweats and on and off diarrhea. Has been taking imodium and wonders about trying lomotil.

## 2024-01-19 ENCOUNTER — Inpatient Hospital Stay

## 2024-01-19 DIAGNOSIS — C9110 Chronic lymphocytic leukemia of B-cell type not having achieved remission: Secondary | ICD-10-CM

## 2024-01-19 MED ORDER — ACETAMINOPHEN 325 MG PO TABS
650.0000 mg | ORAL_TABLET | Freq: Once | ORAL | Status: AC
Start: 2024-01-19 — End: 2024-01-19
  Administered 2024-01-19: 650 mg via ORAL
  Filled 2024-01-19: qty 2

## 2024-01-19 MED ORDER — DIPHENHYDRAMINE HCL 50 MG/ML IJ SOLN
25.0000 mg | Freq: Once | INTRAMUSCULAR | Status: AC
  Administered 2024-01-19: 25 mg via INTRAVENOUS
  Filled 2024-01-19: qty 1

## 2024-01-19 MED ORDER — SODIUM CHLORIDE 0.9% IV SOLUTION
250.0000 mL | INTRAVENOUS | Status: DC
  Administered 2024-01-19: 50 mL via INTRAVENOUS
  Filled 2024-01-19: qty 250

## 2024-01-20 LAB — BPAM RBC
Blood Product Expiration Date: 202504272359
ISSUE DATE / TIME: 202504111314
Unit Type and Rh: 600

## 2024-01-20 LAB — TYPE AND SCREEN
ABO/RH(D): A POS
Antibody Screen: NEGATIVE
Unit division: 0

## 2024-01-25 ENCOUNTER — Inpatient Hospital Stay: Admitting: Pharmacist

## 2024-01-25 ENCOUNTER — Inpatient Hospital Stay

## 2024-01-25 ENCOUNTER — Encounter: Payer: Self-pay | Admitting: Oncology

## 2024-01-25 ENCOUNTER — Inpatient Hospital Stay (HOSPITAL_BASED_OUTPATIENT_CLINIC_OR_DEPARTMENT_OTHER): Admitting: Oncology

## 2024-01-25 VITALS — BP 134/62 | HR 53 | Temp 96.9°F | Resp 16 | Ht 65.0 in | Wt 188.4 lb

## 2024-01-25 DIAGNOSIS — C9110 Chronic lymphocytic leukemia of B-cell type not having achieved remission: Secondary | ICD-10-CM

## 2024-01-25 DIAGNOSIS — D631 Anemia in chronic kidney disease: Secondary | ICD-10-CM

## 2024-01-25 DIAGNOSIS — D696 Thrombocytopenia, unspecified: Secondary | ICD-10-CM

## 2024-01-25 LAB — PREPARE RBC (CROSSMATCH)

## 2024-01-25 LAB — CMP (CANCER CENTER ONLY)
ALT: 20 U/L (ref 0–44)
AST: 15 U/L (ref 15–41)
Albumin: 2.9 g/dL — ABNORMAL LOW (ref 3.5–5.0)
Alkaline Phosphatase: 132 U/L — ABNORMAL HIGH (ref 38–126)
Anion gap: 7 (ref 5–15)
BUN: 65 mg/dL — ABNORMAL HIGH (ref 8–23)
CO2: 18 mmol/L — ABNORMAL LOW (ref 22–32)
Calcium: 7.5 mg/dL — ABNORMAL LOW (ref 8.9–10.3)
Chloride: 112 mmol/L — ABNORMAL HIGH (ref 98–111)
Creatinine: 3.53 mg/dL — ABNORMAL HIGH (ref 0.61–1.24)
GFR, Estimated: 16 mL/min — ABNORMAL LOW (ref >60)
Glucose, Bld: 171 mg/dL — ABNORMAL HIGH (ref 70–99)
Potassium: 4.7 mmol/L (ref 3.5–5.1)
Sodium: 137 mmol/L (ref 135–145)
Total Bilirubin: 0.9 mg/dL (ref 0.0–1.2)
Total Protein: 4.8 g/dL — ABNORMAL LOW (ref 6.5–8.1)

## 2024-01-25 LAB — CBC WITH DIFFERENTIAL (CANCER CENTER ONLY)
Abs Immature Granulocytes: 0.03 10*3/uL (ref 0.00–0.07)
Basophils Absolute: 0 10*3/uL (ref 0.0–0.1)
Basophils Relative: 0 %
Eosinophils Absolute: 0.1 10*3/uL (ref 0.0–0.5)
Eosinophils Relative: 1 %
HCT: 25.6 % — ABNORMAL LOW (ref 39.0–52.0)
Hemoglobin: 7.9 g/dL — ABNORMAL LOW (ref 13.0–17.0)
Immature Granulocytes: 0 %
Lymphocytes Relative: 80 %
Lymphs Abs: 11.8 10*3/uL — ABNORMAL HIGH (ref 0.7–4.0)
MCH: 32.5 pg (ref 26.0–34.0)
MCHC: 30.9 g/dL (ref 30.0–36.0)
MCV: 105.3 fL — ABNORMAL HIGH (ref 80.0–100.0)
Monocytes Absolute: 0.2 10*3/uL (ref 0.1–1.0)
Monocytes Relative: 1 %
Neutro Abs: 2.6 10*3/uL (ref 1.7–7.7)
Neutrophils Relative %: 18 %
Platelet Count: 16 10*3/uL — ABNORMAL LOW (ref 150–400)
RBC: 2.43 MIL/uL — ABNORMAL LOW (ref 4.22–5.81)
RDW: 17.2 % — ABNORMAL HIGH (ref 11.5–15.5)
WBC Count: 14.7 10*3/uL — ABNORMAL HIGH (ref 4.0–10.5)
nRBC: 0 % (ref 0.0–0.2)

## 2024-01-25 LAB — SAMPLE TO BLOOD BANK

## 2024-01-25 NOTE — Progress Notes (Signed)
 Clinical Pharmacist Practitioner Clinic [facility]  Telephone:([phone] [phone]  Patient Care Team: Marguarite Arbour, MD as PCP - General (Internal Medicine) Jeralyn Ruths, MD as Consulting Physician (Oncology)   Name of the patient: Patrick Payne  [id number]  1934-06-29   Date of visit: 01/25/24  HPI: Patient is a 89 y.o. male with with CLL. Patient started ibrutinib on 01/18/24.   Reason for Consult: Oral chemotherapy follow-up for ibrutinib therapy.   PAST MEDICAL HISTORY: Past Medical History:  Diagnosis Date   Anxiety    CKD (chronic kidney disease), stage III (HCC)    CLL (chronic lymphocytic leukemia) (HCC)    Diabetes mellitus without complication (HCC)    Diverticulitis 04/09/2017   History of kidney stones    HLD (hyperlipidemia)    HTN (hypertension)    PKD (polycystic kidney disease)    Sleep apnea    Uncontrolled type 2 diabetes mellitus with hyperglycemia (HCC) 06/28/2018    HEMATOLOGY/ONCOLOGY HISTORY:  Oncology History  Chronic lymphocytic leukemia (HCC)  04/17/2014 Initial Diagnosis   Chronic lymphocytic leukemia (HCC)   01/12/2024 Cancer Staging   Staging form: Chronic Lymphocytic Leukemia / Small Lymphocytic Lymphoma, AJCC 8th Edition - Clinical stage from 01/12/2024: Modified Rai Stage IV (Modified Rai risk: High, Lymphocytosis: Absent, Adenopathy: Absent, Organomegaly: Absent, Anemia: Present, Thrombocytopenia: Present) - Signed by Jeralyn Ruths, MD on 01/13/2024     ALLERGIES:  is allergic to celecoxib, chlorpromazine, iodinated contrast media, prednisone, amoxicillin-pot clavulanate, and penicillin v potassium.  MEDICATIONS:  Current Outpatient Medications  Medication Sig Dispense Refill   Aflibercept 2 MG/0.05ML SOLN Inject 1 Dose into the eye as directed. One injection into left eye every 8-9 weeks     allopurinol (ZYLOPRIM) 100 MG tablet Take 100 mg by mouth daily. (Patient not taking: Reported on  01/08/2024)     atorvastatin (LIPITOR) 10 MG tablet Take 10 mg by mouth at bedtime.      BD PEN NEEDLE NANO 2ND GEN 32G X 4 MM MISC USE AS DIRECTED ONCE A DAY     carvedilol (COREG) 12.5 MG tablet Take 12.5 mg by mouth 2 (two) times daily with a meal.     Cholecalciferol (VITAMIN D-3) 5000 units TABS Take 5,000 Units by mouth at bedtime.      clonazePAM (KLONOPIN) 1 MG tablet Take 1 mg by mouth at bedtime.      CONTOUR NEXT TEST test strip daily.     cyanocobalamin 500 MCG tablet Take 1,000 mcg by mouth at bedtime.      diphenoxylate-atropine (LOMOTIL) 2.5-0.025 MG tablet Take 1 tablet by mouth 4 (four) times daily as needed for diarrhea or loose stools. 60 tablet 1   empagliflozin (JARDIANCE) 10 MG TABS tablet 10 mg daily.     epoetin alfa-epbx (RETACRIT) 62130 UNIT/ML injection Inject 40,000 Units into the skin every 6 (six) weeks. Depending on lab results (if needed)     fexofenadine (ALLEGRA) 180 MG tablet Take 180 mg by mouth daily as needed for allergies.     finasteride (PROSCAR) 5 MG tablet Take 1 tablet (5 mg total) by mouth daily. 90 tablet 3   folic acid (FOLVITE) 400 MCG tablet Take 400 mcg by mouth daily.     ibrutinib (IMBRUVICA) 280 MG tablet Take 1 tablet (280 mg total) by mouth daily. Take with a glass of water. (Patient not taking: Reported on 01/18/2024) 28 tablet 2   insulin glargine (LANTUS) 100 UNIT/ML injection Inject 20-30 Units into the  skin every morning. Takes Lantus 20 units if CBG less than 120 mg/dl or Lantus 30 units if CBG is over 120 mg/dl     Multiple Vitamins-Minerals (PRESERVISION AREDS 2 PO) Take 1 capsule by mouth 2 (two) times daily.      prednisoLONE acetate (PRED FORTE) 1 % ophthalmic suspension Place 1 drop into the right eye 4 (four) times daily.     sertraline (ZOLOFT) 50 MG tablet Take 100 mg by mouth daily.     tamsulosin (FLOMAX) 0.4 MG CAPS capsule TAKE 1 CAPSULE(0.4 MG) BY MOUTH DAILY AFTER SUPPER 90 capsule 3   telmisartan (MICARDIS) 40 MG tablet  Take 40 mg by mouth daily.     torsemide (DEMADEX) 10 MG tablet Take by mouth.     triamcinolone (NASACORT) 55 MCG/ACT AERO nasal inhaler Place 2 sprays into the nose 2 (two) times daily as needed (allergies).      No current facility-administered medications for this visit.   Facility-Administered Medications Ordered in Other Visits  Medication Dose Route Frequency Provider Last Rate Last Admin   epoetin alfa-epbx (RETACRIT) injection 40,000 Units  40,000 Units Subcutaneous Once Jeralyn Ruths, MD        VITAL SIGNS: There were no vitals taken for this visit. There were no vitals filed for this visit.  Estimated body mass index is 31.77 kg/m as calculated from the following:   Height as of 01/18/24: 5\' 5"  (1.651 m).   Weight as of 01/18/24: 86.6 kg (190 lb 14.4 oz).  LABS: CBC:    Component Value Date/Time   WBC 6.6 01/18/2024 1341   WBC 8.2 01/08/2024 0752   HGB 8.1 (L) 01/18/2024 1341   HGB 12.1 (L) 02/21/2014 1340   HCT 25.8 (L) 01/18/2024 1341   HCT 36.2 (L) 02/21/2014 1340   PLT 25 (L) 01/18/2024 1341   PLT 76 (L) 02/21/2014 1340   MCV 103.6 (H) 01/18/2024 1341   MCV 98 02/21/2014 1340   NEUTROABS 1.6 (L) 01/18/2024 1341   NEUTROABS 2.7 02/21/2014 1340   LYMPHSABS 4.6 (H) 01/18/2024 1341   LYMPHSABS 16.9 (H) 02/21/2014 1340   MONOABS 0.3 01/18/2024 1341   MONOABS 0.4 02/21/2014 1340   EOSABS 0.1 01/18/2024 1341   EOSABS 0.3 02/21/2014 1340   BASOSABS 0.0 01/18/2024 1341   BASOSABS 0.1 02/21/2014 1340   Comprehensive Metabolic Panel:    Component Value Date/Time   NA 137 01/18/2024 1341   NA 138 06/09/2017 1110   K 5.1 01/18/2024 1341   CL 110 01/18/2024 1341   CO2 20 (L) 01/18/2024 1341   BUN 71 (H) 01/18/2024 1341   BUN 26 06/09/2017 1110   CREATININE 3.38 (H) 01/18/2024 1341   CREATININE 1.57 (H) 02/21/2014 1340   GLUCOSE 171 (H) 01/18/2024 1341   CALCIUM 7.8 (L) 01/18/2024 1341   AST 17 01/18/2024 1341   ALT 21 01/18/2024 1341   ALKPHOS 122  01/18/2024 1341   BILITOT 0.9 01/18/2024 1341   PROT 4.6 (L) 01/18/2024 1341   ALBUMIN 2.7 (L) 01/18/2024 1341     Present during today's visit: patient and his wife  Assessment and Plan: CBC/CMP reviewed: Patient has only been on ibrutinib for 7 days so do expect pltc or hgb improvement yet.  Explained that WBC/ALC increase is likely the beginning of lymphocytosis which is expected Continue ibrutinib 280mg  daily  Patient is reporting continued night sweats, would expect frequency to decrease and night sweat to eventually stop with treatment, will continue to monitor  Oral Chemotherapy Side Effect/Intolerance:  No reported rash or nausea. Edema, bowel movements, and fatigue unchanged  Oral Chemotherapy Adherence: No missed doses reported No patient barriers to medication adherence identified.   New medications: None reported  Medication Access Issues: No issues  Patient expressed understanding and was in agreement with this plan. He also understands that He can call clinic at any time with any questions, concerns, or complaints.   Follow-up plan: RTC as scheduled  Thank you for allowing me to participate in the care of this very pleasant patient.   Time Total: 10 mins  Visit consisted of counseling and education on dealing with issues of symptom management in the setting of serious and potentially life-threatening illness.Greater than 50%  of this time was spent counseling and coordinating care related to the above assessment and plan.  Signed by: Karston Hyland N. Saralynn Langhorst, PharmD, Lorraine Roses, CPP Hematology/Oncology Clinical Pharmacist Practitioner /[facility] [phone]  01/25/2024 11:44 AM

## 2024-01-25 NOTE — Progress Notes (Signed)
 [facility]  [phone] [phone]  ID: Patrick Bradley Herberg Jr. OB: 01-20-1934  MR#: [id number]  LKG#:[id number]  Patient Care Team: Yehuda Helms, MD as PCP - General (Internal Medicine) Shellie Dials, MD as Consulting Physician (Oncology)  CHIEF COMPLAINT: CLL, anemia/thrombocytopenia.  INTERVAL HISTORY: Patient returns to clinic today for repeat laboratory work, further evaluation, consideration of additional blood, and to assess his toleration of Imbruvica.  He continues to have chronic weakness and fatigue, but otherwise feels well.  He is tolerating treatment without significant side effects.  He continues to have easy bruising.  He has no neurologic complaints today.  He denies any chest pain, shortness of breath, cough, or hemoptysis.  He denies any nausea, vomiting, constipation, or diarrhea. He has no urinary complaints.  Patient offers no further specific complaints today.  REVIEW OF SYSTEMS:   Review of Systems  Constitutional:  Positive for malaise/fatigue. Negative for diaphoresis, fever and weight loss.  Respiratory: Negative.  Negative for cough and shortness of breath.   Cardiovascular: Negative.  Negative for chest pain and leg swelling.  Gastrointestinal: Negative.  Negative for abdominal pain, blood in stool and melena.  Genitourinary: Negative.  Negative for dysuria.  Musculoskeletal: Negative.  Negative for back pain.  Skin: Negative.  Negative for rash.  Neurological:  Positive for weakness. Negative for dizziness, sensory change, focal weakness and headaches.  Endo/Heme/Allergies:  Bruises/bleeds easily.  Psychiatric/Behavioral: Negative.  The patient is not nervous/anxious.     As per HPI. Otherwise, a complete review of systems is negative.  PAST MEDICAL HISTORY: Past Medical History:  Diagnosis Date   Anxiety    CKD (chronic kidney disease), stage III (HCC)    CLL (chronic lymphocytic leukemia) (HCC)     Diabetes mellitus without complication (HCC)    Diverticulitis 04/09/2017   History of kidney stones    HLD (hyperlipidemia)    HTN (hypertension)    PKD (polycystic kidney disease)    Sleep apnea    Uncontrolled type 2 diabetes mellitus with hyperglycemia (HCC) 06/28/2018    PAST SURGICAL HISTORY: Past Surgical History:  Procedure Laterality Date   CARDIAC CATHETERIZATION  12/1987   CENTRAL LINE INSERTION N/A 04/11/2017   Procedure: Central Line Insertion;  Surgeon: Jackquelyn Mass, MD;  Location: [facility];  Service: Cardiovascular;  Laterality: N/A;   CERVICAL FUSION     CHOLECYSTECTOMY     CYSTOSCOPY W/ RETROGRADES Left 05/12/2017   Procedure: CYSTOSCOPY WITH RETROGRADE PYELOGRAM;  Surgeon: Bart Born, MD;  Location: [facility] ORS;  Service: Urology;  Laterality: Left;   CYSTOSCOPY W/ URETERAL STENT PLACEMENT Left 05/12/2017   Procedure: CYSTOSCOPY WITH STENT REMOVAL;  Surgeon: Bart Born, MD;  Location: [facility] ORS;  Service: Urology;  Laterality: Left;   CYSTOSCOPY WITH STENT PLACEMENT Left 04/11/2017   Procedure: CYSTOSCOPY WITH STENT PLACEMENT;  Surgeon: Christina Coyer, MD;  Location: [facility] ORS;  Service: Urology;  Laterality: Left;   URETEROSCOPY  05/12/2017   Procedure: URETEROSCOPY;  Surgeon: Bart Born, MD;  Location: [facility] OR[facility];  Service: Urology;;    FAMILY HISTORY: Reported history of ovarian and lung cancer. Diabetes, hypertension.     ADVANCED DIRECTIVES:    HEALTH MAINTENANCE: Social History   Tobacco Use   Smoking status: Former    Types: Cigarettes    Passive exposure: Past   Smokeless tobacco: Never   Tobacco comments:    quit in Feb of 1997  Vaping Use   Vaping status: Never  Used  Substance Use Topics   Alcohol use: No   Drug use: No     Colonoscopy:  PAP:  Bone density:  Lipid panel:  Allergies  Allergen Reactions   Celecoxib Other (See Comments)    Increased Blood Pressure   Chlorpromazine Nausea Only    Iodinated Contrast Media Other (See Comments)    Stage 3 kidney disease, told IV dye would be bad for him   Prednisone Other (See Comments)    Diabetic   Amoxicillin-Pot Clavulanate Rash    Has patient had a PCN reaction causing immediate rash, facial/tongue/throat swelling, SOB or lightheadedness with hypotension: No Has patient had a PCN reaction causing severe rash involving mucus membranes or skin necrosis: No Has patient had a PCN reaction that required hospitalization: No Has patient had a PCN reaction occurring within the last 10 years: Yes If all of the above answers are "NO", then may proceed with Cephalosporin use.    Penicillin V Potassium Rash    Current Outpatient Medications  Medication Sig Dispense Refill   Aflibercept 2 MG/0.05ML SOLN Inject 1 Dose into the eye as directed. One injection into left eye every 8-9 weeks     atorvastatin (LIPITOR) 10 MG tablet Take 10 mg by mouth at bedtime.      BD PEN NEEDLE NANO 2ND GEN 32G X 4 MM MISC USE AS DIRECTED ONCE A DAY     carvedilol (COREG) 12.5 MG tablet Take 12.5 mg by mouth 2 (two) times daily with a meal.     Cholecalciferol (VITAMIN D-3) 5000 units TABS Take 5,000 Units by mouth at bedtime.      clonazePAM (KLONOPIN) 1 MG tablet Take 1 mg by mouth at bedtime.      CONTOUR NEXT TEST test strip daily.     cyanocobalamin 500 MCG tablet Take 1,000 mcg by mouth at bedtime.      diphenoxylate-atropine (LOMOTIL) 2.5-0.025 MG tablet Take 1 tablet by mouth 4 (four) times daily as needed for diarrhea or loose stools. 60 tablet 1   empagliflozin (JARDIANCE) 10 MG TABS tablet 10 mg daily.     finasteride (PROSCAR) 5 MG tablet Take 1 tablet (5 mg total) by mouth daily. 90 tablet 3   folic acid (FOLVITE) 400 MCG tablet Take 400 mcg by mouth daily.     ibrutinib (IMBRUVICA) 280 MG tablet Take 1 tablet (280 mg total) by mouth daily. Take with a glass of water. 28 tablet 2   insulin glargine (LANTUS) 100 UNIT/ML injection Inject 20-30 Units  into the skin every morning. Takes Lantus 20 units if CBG less than 120 mg/dl or Lantus 30 units if CBG is over 120 mg/dl     Multiple Vitamins-Minerals (PRESERVISION AREDS 2 PO) Take 1 capsule by mouth 2 (two) times daily.      prednisoLONE acetate (PRED FORTE) 1 % ophthalmic suspension Place 1 drop into the right eye 4 (four) times daily.     sertraline (ZOLOFT) 50 MG tablet Take 100 mg by mouth daily.     tamsulosin (FLOMAX) 0.4 MG CAPS capsule TAKE 1 CAPSULE(0.4 MG) BY MOUTH DAILY AFTER SUPPER 90 capsule 3   telmisartan (MICARDIS) 40 MG tablet Take 40 mg by mouth daily.     torsemide (DEMADEX) 10 MG tablet Take by mouth.     triamcinolone (NASACORT) 55 MCG/ACT AERO nasal inhaler Place 2 sprays into the nose 2 (two) times daily as needed (allergies).      epoetin alfa-epbx (RETACRIT) 40000 UNIT/ML injection  Inject 40,000 Units into the skin every 6 (six) weeks. Depending on lab results (if needed)     fexofenadine (ALLEGRA) 180 MG tablet Take 180 mg by mouth daily as needed for allergies. (Patient not taking: Reported on 01/25/2024)     No current facility-administered medications for this visit.   Facility-Administered Medications Ordered in Other Visits  Medication Dose Route Frequency Provider Last Rate Last Admin   epoetin alfa-epbx (RETACRIT) injection 40,000 Units  40,000 Units Subcutaneous Once Jeralyn Ruths, MD        OBJECTIVE: Vitals:   01/25/24 1407  BP: 134/62  Pulse: (!) 53  Resp: 16  Temp: (!) 96.9 F (36.1 C)  SpO2: 100%      Body mass index is 31.35 kg/m.    ECOG FS:1 - Symptomatic but completely ambulatory  General: Well-developed, well-nourished, no acute distress. Eyes: Pink conjunctiva, anicteric sclera. HEENT: Normocephalic, moist mucous membranes. Lungs: No audible wheezing or coughing. Heart: Regular rate and rhythm. Abdomen: Soft, nontender, no obvious distention. Musculoskeletal: No edema, cyanosis, or clubbing. Neuro: Alert, answering all  questions appropriately. Cranial nerves grossly intact. Skin: No rashes or petechiae noted. Psych: Normal affect.  LAB RESULTS:  Lab Results  Component Value Date   NA 137 01/25/2024   K 4.7 01/25/2024   CL 112 (H) 01/25/2024   CO2 18 (L) 01/25/2024   GLUCOSE 171 (H) 01/25/2024   BUN 65 (H) 01/25/2024   CREATININE 3.53 (H) 01/25/2024   CALCIUM 7.5 (L) 01/25/2024   PROT 4.8 (L) 01/25/2024   ALBUMIN 2.9 (L) 01/25/2024   AST 15 01/25/2024   ALT 20 01/25/2024   ALKPHOS 132 (H) 01/25/2024   BILITOT 0.9 01/25/2024   GFRNONAA 16 (L) 01/25/2024   GFRAA 37 (L) 11/19/2019    Lab Results  Component Value Date   WBC 14.7 (H) 01/25/2024   NEUTROABS 2.6 01/25/2024   HGB 7.9 (L) 01/25/2024   HCT 25.6 (L) 01/25/2024   MCV 105.3 (H) 01/25/2024   PLT 16 (L) 01/25/2024   Lab Results  Component Value Date   IRON 79 01/11/2024   TIBC 304 01/11/2024   IRONPCTSAT 26 01/11/2024   Lab Results  Component Value Date   FERRITIN 142 01/11/2024     STUDIES: CT BONE MARROW BIOPSY & ASPIRATION Result Date: 01/08/2024 CLINICAL DATA:  History of chronic lymphocytic leukemia with refractory anemia and thrombocytopenia. Bone marrow biopsy needed for further evaluation. EXAM: CT GUIDED BONE MARROW ASPIRATION AND BIOPSY ANESTHESIA/SEDATION: Moderate (conscious) sedation was employed during this procedure. A total of Versed 1.0 mg and Fentanyl 50 mcg was administered intravenously. Moderate Sedation Time: 10 minutes. The patient's level of consciousness and vital signs were monitored continuously by radiology nursing throughout the procedure under my direct supervision. PROCEDURE: The procedure risks, benefits, and alternatives were explained to the patient. Questions regarding the procedure were encouraged and answered. The patient understands and consents to the procedure. A time out was performed prior to initiating the procedure. The right gluteal region was prepped with chlorhexidine. Sterile gown and  sterile gloves were used for the procedure. Local anesthesia was provided with 1% Lidocaine. Under CT guidance, an 11 gauge On Control bone cutting needle was advanced from a posterior approach into the right iliac bone. Needle positioning was confirmed with CT. Initial non heparinized and heparinized aspirate samples were obtained of bone marrow. Core biopsy was performed via the On Control drill needle. COMPLICATIONS: None FINDINGS: Initial aspirate samples were obtained. Two separate intact core biopsy samples  were obtained. IMPRESSION: CT guided bone marrow biopsy of right posterior iliac bone with both aspirate and core samples obtained. Electronically Signed   By: Erica Hau M.D.   On: 01/08/2024 10:17    ASSESSMENT: CLL, anemia/thrombocytopenia.  PLAN:    CLL: Bone marrow biopsy on January 08, 2024 reported 69% clonal B-cell lymphocytes consistent with a diagnosis of CLL.  Patient does not have any peripheral lymphocytosis.  No other pathology was reported.  It is possible patient's chronic thrombocytopenia, worsening anemia, night sweats are related to his CLL, therefore patient was initiated on dose reduced Imbruvica 280 mg daily last week.  Continue with treatment as prescribed.  Return to clinic in 1 week for laboratory work, further evaluation, and continuation of treatment.  Appreciate clinical pharmacy input.     Anemia, unspecified: Possibly related to progression of CLL, but we do not have a previous bone marrow biopsy for comparison.  Hemoglobin essentially unchanged at 7.9 despite 1 unit of packed red blood cells today.  Return tomorrow for additional infusion.  All blood products should be irradiated. Thrombocytopenia: Platelets worse today at 16.  Likely secondary to Imbruvica.  Monitor.  Bone marrow biopsy as above.  Previously, patient required multiple transfusions of platelets prior to his oral surgery.  He did not have any excessive bleeding during his surgery.  His count was as  high as 95 in June 2016.  He does not require transfusion today.   Lymphadenopathy: CT scan result from December 26, 2023 reviewed independently with no obvious lymphadenopathy. Aortic aneurysm: Appreciate vascular surgery input.  Patient elected to hold off any procedures until his aneurysm reaches 5.5 cm. Dental surgery: Successful and resolved. Renal insufficiency: Chronic and unchanged.  Patient's GFR 16.  Continue monitoring and treatment per nephrology.   Patient expressed understanding and was in agreement with this plan. He also understands that He can call clinic at any time with any questions, concerns, or complaints.    Cancer Staging  Chronic lymphocytic leukemia (HCC) Staging form: Chronic Lymphocytic Leukemia / Small Lymphocytic Lymphoma, AJCC 8th Edition - Clinical stage from 01/12/2024: Modified Rai Stage IV (Modified Rai risk: High, Lymphocytosis: Absent, Adenopathy: Absent, Organomegaly: Absent, Anemia: Present, Thrombocytopenia: Present) - Signed by Shellie Dials, MD on 01/13/2024    Shellie Dials, MD   01/25/2024 2:47 PM

## 2024-01-25 NOTE — Progress Notes (Signed)
 Concerned with weather or not the medication is working.

## 2024-01-26 ENCOUNTER — Inpatient Hospital Stay

## 2024-01-26 DIAGNOSIS — C9110 Chronic lymphocytic leukemia of B-cell type not having achieved remission: Secondary | ICD-10-CM

## 2024-01-26 MED ORDER — ACETAMINOPHEN 325 MG PO TABS
650.0000 mg | ORAL_TABLET | Freq: Once | ORAL | Status: AC
  Administered 2024-01-26: 650 mg via ORAL
  Filled 2024-01-26: qty 2

## 2024-01-26 MED ORDER — SODIUM CHLORIDE 0.9% IV SOLUTION
250.0000 mL | INTRAVENOUS | Status: DC
  Administered 2024-01-26: 50 mL via INTRAVENOUS
  Filled 2024-01-26: qty 250

## 2024-01-26 MED ORDER — DIPHENHYDRAMINE HCL 50 MG/ML IJ SOLN
25.0000 mg | Freq: Once | INTRAMUSCULAR | Status: AC
  Administered 2024-01-26: 25 mg via INTRAVENOUS
  Filled 2024-01-26: qty 1

## 2024-01-26 NOTE — Patient Instructions (Signed)

## 2024-01-27 LAB — TYPE AND SCREEN
ABO/RH(D): A POS
Antibody Screen: NEGATIVE
Unit division: 0

## 2024-01-27 LAB — BPAM RBC
Blood Product Expiration Date: 202505092359
ISSUE DATE / TIME: 202504181149
Unit Type and Rh: 5100

## 2024-02-01 ENCOUNTER — Inpatient Hospital Stay: Admitting: Pharmacist

## 2024-02-01 ENCOUNTER — Inpatient Hospital Stay (HOSPITAL_BASED_OUTPATIENT_CLINIC_OR_DEPARTMENT_OTHER): Admitting: Oncology

## 2024-02-01 ENCOUNTER — Inpatient Hospital Stay

## 2024-02-01 ENCOUNTER — Encounter: Payer: Self-pay | Admitting: Oncology

## 2024-02-01 VITALS — BP 167/49 | HR 51 | Temp 98.9°F | Resp 20 | Wt 189.1 lb

## 2024-02-01 DIAGNOSIS — C9110 Chronic lymphocytic leukemia of B-cell type not having achieved remission: Secondary | ICD-10-CM | POA: Diagnosis not present

## 2024-02-01 DIAGNOSIS — D631 Anemia in chronic kidney disease: Secondary | ICD-10-CM

## 2024-02-01 DIAGNOSIS — D696 Thrombocytopenia, unspecified: Secondary | ICD-10-CM

## 2024-02-01 LAB — CBC WITH DIFFERENTIAL (CANCER CENTER ONLY)
Abs Immature Granulocytes: 0.05 10*3/uL (ref 0.00–0.07)
Basophils Absolute: 0.1 10*3/uL (ref 0.0–0.1)
Basophils Relative: 0 %
Eosinophils Absolute: 0.1 10*3/uL (ref 0.0–0.5)
Eosinophils Relative: 1 %
HCT: 27.1 % — ABNORMAL LOW (ref 39.0–52.0)
Hemoglobin: 8.6 g/dL — ABNORMAL LOW (ref 13.0–17.0)
Immature Granulocytes: 0 %
Lymphocytes Relative: 85 %
Lymphs Abs: 16.9 10*3/uL — ABNORMAL HIGH (ref 0.7–4.0)
MCH: 32.8 pg (ref 26.0–34.0)
MCHC: 31.7 g/dL (ref 30.0–36.0)
MCV: 103.4 fL — ABNORMAL HIGH (ref 80.0–100.0)
Monocytes Absolute: 0.2 10*3/uL (ref 0.1–1.0)
Monocytes Relative: 1 %
Neutro Abs: 2.6 10*3/uL (ref 1.7–7.7)
Neutrophils Relative %: 13 %
Platelet Count: 19 10*3/uL — ABNORMAL LOW (ref 150–400)
RBC: 2.62 MIL/uL — ABNORMAL LOW (ref 4.22–5.81)
RDW: 17.2 % — ABNORMAL HIGH (ref 11.5–15.5)
Smear Review: NORMAL
WBC Count: 19.9 10*3/uL — ABNORMAL HIGH (ref 4.0–10.5)
nRBC: 0 % (ref 0.0–0.2)

## 2024-02-01 LAB — CMP (CANCER CENTER ONLY)
ALT: 24 U/L (ref 0–44)
AST: 17 U/L (ref 15–41)
Albumin: 3 g/dL — ABNORMAL LOW (ref 3.5–5.0)
Alkaline Phosphatase: 124 U/L (ref 38–126)
Anion gap: 7 (ref 5–15)
BUN: 63 mg/dL — ABNORMAL HIGH (ref 8–23)
CO2: 19 mmol/L — ABNORMAL LOW (ref 22–32)
Calcium: 7.8 mg/dL — ABNORMAL LOW (ref 8.9–10.3)
Chloride: 111 mmol/L (ref 98–111)
Creatinine: 3.59 mg/dL — ABNORMAL HIGH (ref 0.61–1.24)
GFR, Estimated: 16 mL/min — ABNORMAL LOW (ref >60)
Glucose, Bld: 144 mg/dL — ABNORMAL HIGH (ref 70–99)
Potassium: 4.7 mmol/L (ref 3.5–5.1)
Sodium: 137 mmol/L (ref 135–145)
Total Bilirubin: 0.6 mg/dL (ref 0.0–1.2)
Total Protein: 4.9 g/dL — ABNORMAL LOW (ref 6.5–8.1)

## 2024-02-01 LAB — SAMPLE TO BLOOD BANK

## 2024-02-01 NOTE — Progress Notes (Signed)
 Patient not seen today

## 2024-02-01 NOTE — Progress Notes (Unsigned)
 [facility]  Telephone:([phone] [phone]  ID: Patrick Bradley Bouchard Jr. OB: 23-May-1934  MR#: [id number]  AOZ#:[id number]  Patient Care Team: Yehuda Helms, MD as PCP - General (Internal Medicine) Shellie Dials, MD as Consulting Physician (Oncology)  CHIEF COMPLAINT: CLL, anemia/thrombocytopenia.  INTERVAL HISTORY: Patient returns to clinic today for repeat laboratory, further evaluation, and consideration of additional blood.  He continues to tolerate Imbruvica  well without significant side effects.  He continues to have chronic weakness and fatigue.  He continues to have easy bruising.  He has no neurologic complaints today.  He denies any chest pain, shortness of breath, cough, or hemoptysis.  He denies any nausea, vomiting, constipation, or diarrhea. He has no urinary complaints.  Patient offers no further specific complaints today.  REVIEW OF SYSTEMS:   Review of Systems  Constitutional:  Positive for malaise/fatigue. Negative for diaphoresis, fever and weight loss.  Respiratory: Negative.  Negative for cough and shortness of breath.   Cardiovascular: Negative.  Negative for chest pain and leg swelling.  Gastrointestinal: Negative.  Negative for abdominal pain, blood in stool and melena.  Genitourinary: Negative.  Negative for dysuria.  Musculoskeletal: Negative.  Negative for back pain.  Skin: Negative.  Negative for rash.  Neurological:  Positive for weakness. Negative for dizziness, sensory change, focal weakness and headaches.  Endo/Heme/Allergies:  Bruises/bleeds easily.  Psychiatric/Behavioral: Negative.  The patient is not nervous/anxious.     As per HPI. Otherwise, a complete review of systems is negative.  PAST MEDICAL HISTORY: Past Medical History:  Diagnosis Date   Anxiety    CKD (chronic kidney disease), stage III (HCC)    CLL (chronic lymphocytic leukemia) (HCC)    Diabetes mellitus without complication (HCC)     Diverticulitis 04/09/2017   History of kidney stones    HLD (hyperlipidemia)    HTN (hypertension)    PKD (polycystic kidney disease)    Sleep apnea    Uncontrolled type 2 diabetes mellitus with hyperglycemia (HCC) 06/28/2018    PAST SURGICAL HISTORY: Past Surgical History:  Procedure Laterality Date   CARDIAC CATHETERIZATION  12/1987   CENTRAL LINE INSERTION N/A 04/11/2017   Procedure: Central Line Insertion;  Surgeon: Jackquelyn Mass, MD;  Location: [facility];  Service: Cardiovascular;  Laterality: N/A;   CERVICAL FUSION     CHOLECYSTECTOMY     CYSTOSCOPY W/ RETROGRADES Left 05/12/2017   Procedure: CYSTOSCOPY WITH RETROGRADE PYELOGRAM;  Surgeon: Bart Born, MD;  Location: [facility];  Service: Urology;  Laterality: Left;   CYSTOSCOPY W/ URETERAL STENT PLACEMENT Left 05/12/2017   Procedure: CYSTOSCOPY WITH STENT REMOVAL;  Surgeon: Bart Born, MD;  Location: [facility];  Service: Urology;  Laterality: Left;   CYSTOSCOPY WITH STENT PLACEMENT Left 04/11/2017   Procedure: CYSTOSCOPY WITH STENT PLACEMENT;  Surgeon: Christina Coyer, MD;  Location: [facility];  Service: Urology;  Laterality: Left;   URETEROSCOPY  05/12/2017   Procedure: URETEROSCOPY;  Surgeon: Bart Born, MD;  Location: [facility];  Service: Urology;;    FAMILY HISTORY: Reported history of ovarian and lung cancer. Diabetes, hypertension.     ADVANCED DIRECTIVES:    HEALTH MAINTENANCE: Social History   Tobacco Use   Smoking status: Former    Types: Cigarettes    Passive exposure: Past   Smokeless tobacco: Never   Tobacco comments:    quit in Feb of 1997  Vaping Use   Vaping status: Never Used  Substance Use Topics   Alcohol use:  No   Drug use: No     Colonoscopy:  PAP:  Bone density:  Lipid panel:  Allergies  Allergen Reactions   Celecoxib Other (See Comments)    Increased Blood Pressure   Chlorpromazine Nausea Only   Iodinated Contrast Media Other (See Comments)    Stage  3 kidney disease, told IV dye would be bad for him   Prednisone Other (See Comments)    Diabetic   Amoxicillin-Pot Clavulanate Rash    Has patient had a PCN reaction causing immediate rash, facial/tongue/throat swelling, SOB or lightheadedness with hypotension: No Has patient had a PCN reaction causing severe rash involving mucus membranes or skin necrosis: No Has patient had a PCN reaction that required hospitalization: No Has patient had a PCN reaction occurring within the last 10 years: Yes If all of the above answers are "NO", then may proceed with Cephalosporin use.    Penicillin V Potassium Rash    Current Outpatient Medications  Medication Sig Dispense Refill   Aflibercept 2 MG/0.05ML SOLN Inject 1 Dose into the eye as directed. One injection into left eye every 8-9 weeks     atorvastatin  (LIPITOR) 10 MG tablet Take 10 mg by mouth at bedtime.      BD PEN NEEDLE NANO 2ND GEN 32G X 4 MM MISC USE AS DIRECTED ONCE A DAY     carvedilol  (COREG ) 12.5 MG tablet Take 12.5 mg by mouth 2 (two) times daily with a meal.     Cholecalciferol (VITAMIN D -3) 5000 units TABS Take 5,000 Units by mouth at bedtime.      clonazePAM  (KLONOPIN ) 1 MG tablet Take 1 mg by mouth at bedtime.      CONTOUR NEXT TEST test strip daily.     cyanocobalamin 500 MCG tablet Take 1,000 mcg by mouth at bedtime.      diphenoxylate -atropine  (LOMOTIL ) 2.5-0.025 MG tablet Take 1 tablet by mouth 4 (four) times daily as needed for diarrhea or loose stools. 60 tablet 1   empagliflozin (JARDIANCE) 10 MG TABS tablet 10 mg daily.     epoetin  alfa-epbx (RETACRIT ) 40000 UNIT/ML injection Inject 40,000 Units into the skin every 6 (six) weeks. Depending on lab results (if needed)     fexofenadine (ALLEGRA) 180 MG tablet Take 180 mg by mouth daily as needed for allergies. (Patient not taking: Reported on 01/25/2024)     finasteride  (PROSCAR ) 5 MG tablet Take 1 tablet (5 mg total) by mouth daily. 90 tablet 3   folic acid  (FOLVITE ) 400 MCG  tablet Take 400 mcg by mouth daily.     ibrutinib  (IMBRUVICA ) 280 MG tablet Take 1 tablet (280 mg total) by mouth daily. Take with a glass of water. 28 tablet 2   insulin  glargine (LANTUS ) 100 UNIT/ML injection Inject 20-30 Units into the skin every morning. Takes Lantus  20 units if CBG less than 120 mg/dl or Lantus  30 units if CBG is over 120 mg/dl     Multiple Vitamins-Minerals (PRESERVISION AREDS 2 PO) Take 1 capsule by mouth 2 (two) times daily.      prednisoLONE acetate (PRED FORTE) 1 % ophthalmic suspension Place 1 drop into the right eye 4 (four) times daily.     sertraline (ZOLOFT) 50 MG tablet Take 100 mg by mouth daily.     tamsulosin  (FLOMAX ) 0.4 MG CAPS capsule TAKE 1 CAPSULE(0.4 MG) BY MOUTH DAILY AFTER SUPPER 90 capsule 3   telmisartan (MICARDIS) 40 MG tablet Take 40 mg by mouth daily.     torsemide (  DEMADEX) 10 MG tablet Take by mouth.     triamcinolone (NASACORT) 55 MCG/ACT AERO nasal inhaler Place 2 sprays into the nose 2 (two) times daily as needed (allergies).      No current facility-administered medications for this visit.   Facility-Administered Medications Ordered in Other Visits  Medication Dose Route Frequency Provider Last Rate Last Admin   epoetin  alfa-epbx (RETACRIT ) injection 40,000 Units  40,000 Units Subcutaneous Once Shellie Dials, MD        OBJECTIVE: Vitals:   02/01/24 1429  BP: (!) 167/49  Pulse: (!) 51  Resp: 20  Temp: 98.9 F (37.2 C)  SpO2: 100%      Body mass index is 31.47 kg/m.    ECOG FS:1 - Symptomatic but completely ambulatory  General: Well-developed, well-nourished, no acute distress. Eyes: Pink conjunctiva, anicteric sclera. HEENT: Normocephalic, moist mucous membranes. Lungs: No audible wheezing or coughing. Heart: Regular rate and rhythm. Abdomen: Soft, nontender, no obvious distention. Musculoskeletal: No edema, cyanosis, or clubbing. Neuro: Alert, answering all questions appropriately. Cranial nerves grossly intact. Skin:  No rashes or petechiae noted. Psych: Normal affect.  LAB RESULTS:  Lab Results  Component Value Date   NA 137 02/01/2024   K 4.7 02/01/2024   CL 111 02/01/2024   CO2 19 (L) 02/01/2024   GLUCOSE 144 (H) 02/01/2024   BUN 63 (H) 02/01/2024   CREATININE 3.59 (H) 02/01/2024   CALCIUM  7.8 (L) 02/01/2024   PROT 4.9 (L) 02/01/2024   ALBUMIN 3.0 (L) 02/01/2024   AST 17 02/01/2024   ALT 24 02/01/2024   ALKPHOS 124 02/01/2024   BILITOT 0.6 02/01/2024   GFRNONAA 16 (L) 02/01/2024   GFRAA 37 (L) 11/19/2019    Lab Results  Component Value Date   WBC 19.9 (H) 02/01/2024   NEUTROABS PENDING 02/01/2024   HGB 8.6 (L) 02/01/2024   HCT 27.1 (L) 02/01/2024   MCV 103.4 (H) 02/01/2024   PLT 19 (L) 02/01/2024   Lab Results  Component Value Date   IRON 79 01/11/2024   TIBC 304 01/11/2024   IRONPCTSAT 26 01/11/2024   Lab Results  Component Value Date   FERRITIN 142 01/11/2024     STUDIES: CT BONE MARROW BIOPSY & ASPIRATION Result Date: 01/08/2024 CLINICAL DATA:  History of chronic lymphocytic leukemia with refractory anemia and thrombocytopenia. Bone marrow biopsy needed for further evaluation. EXAM: CT GUIDED BONE MARROW ASPIRATION AND BIOPSY ANESTHESIA/SEDATION: Moderate (conscious) sedation was employed during this procedure. A total of Versed  1.0 mg and Fentanyl  50 mcg was administered intravenously. Moderate Sedation Time: 10 minutes. The patient's level of consciousness and vital signs were monitored continuously by radiology nursing throughout the procedure under my direct supervision. PROCEDURE: The procedure risks, benefits, and alternatives were explained to the patient. Questions regarding the procedure were encouraged and answered. The patient understands and consents to the procedure. A time out was performed prior to initiating the procedure. The right gluteal region was prepped with chlorhexidine. Sterile gown and sterile gloves were used for the procedure. Local anesthesia was  provided with 1% Lidocaine . Under CT guidance, an 11 gauge On Control bone cutting needle was advanced from a posterior approach into the right iliac bone. Needle positioning was confirmed with CT. Initial non heparinized and heparinized aspirate samples were obtained of bone marrow. Core biopsy was performed via the On Control drill needle. COMPLICATIONS: None FINDINGS: Initial aspirate samples were obtained. Two separate intact core biopsy samples were obtained. IMPRESSION: CT guided bone marrow biopsy of right posterior  iliac bone with both aspirate and core samples obtained. Electronically Signed   By: Erica Hau M.D.   On: 01/08/2024 10:17    ASSESSMENT: CLL, anemia/thrombocytopenia.  PLAN:    CLL: Bone marrow biopsy on January 08, 2024 reported 69% clonal B-cell lymphocytes consistent with a diagnosis of CLL.  Patient does not have any peripheral lymphocytosis.  No other pathology was reported.  It is possible patient's chronic thrombocytopenia, worsening anemia, night sweats are related to his CLL, therefore patient was initiated on dose reduced Imbruvica  280 mg daily in early April 2025.  Continue with treatment as prescribed.  Patient does not require blood transfusion this week.  Return to clinic in 1 week for laboratory work, further evaluation, and continuation of treatment.  Appreciate clinical pharmacy input.     Anemia, unspecified: Possibly related to progression of CLL, but we do not have a previous bone marrow biopsy for comparison.  Hemoglobin improved to 8.6 with transfusion.  No transfusion is needed this week.  Return to clinic as above.  All blood products should be irradiated. Thrombocytopenia: Platelets mildly improved to 19.  Likely secondary to Imbruvica .  Monitor.  Bone marrow biopsy as above.  Previously, patient required multiple transfusions of platelets prior to his oral surgery.  He did not have any excessive bleeding during his surgery.  His count was as high as 95 in  June 2016.  He does not require transfusion today.   Lymphadenopathy: CT scan result from December 26, 2023 reviewed independently with no obvious lymphadenopathy. Aortic aneurysm: Appreciate vascular surgery input.  Patient elected to hold off any procedures until his aneurysm reaches 5.5 cm. Dental surgery: Successful and resolved. Renal insufficiency: Chronic and unchanged.  Patient's GFR remains 16.  Continue monitoring and treatment per nephrology.  Imbruvica  does not need to be dose reduced in the setting of renal dysfunction.  Patient expressed understanding and was in agreement with this plan. He also understands that He can call clinic at any time with any questions, concerns, or complaints.    Cancer Staging  Chronic lymphocytic leukemia (HCC) Staging form: Chronic Lymphocytic Leukemia / Small Lymphocytic Lymphoma, AJCC 8th Edition - Clinical stage from 01/12/2024: Modified Rai Stage IV (Modified Rai risk: High, Lymphocytosis: Absent, Adenopathy: Absent, Organomegaly: Absent, Anemia: Present, Thrombocytopenia: Present) - Signed by Shellie Dials, MD on 01/13/2024    Shellie Dials, MD   02/01/2024 2:36 PM

## 2024-02-02 ENCOUNTER — Inpatient Hospital Stay

## 2024-02-02 ENCOUNTER — Encounter: Payer: Self-pay | Admitting: Oncology

## 2024-02-06 ENCOUNTER — Other Ambulatory Visit (HOSPITAL_COMMUNITY): Payer: Self-pay

## 2024-02-08 ENCOUNTER — Other Ambulatory Visit (HOSPITAL_COMMUNITY): Payer: Self-pay

## 2024-02-08 ENCOUNTER — Inpatient Hospital Stay: Admitting: Oncology

## 2024-02-08 ENCOUNTER — Other Ambulatory Visit: Payer: Self-pay | Admitting: *Deleted

## 2024-02-08 ENCOUNTER — Inpatient Hospital Stay: Admitting: Pharmacist

## 2024-02-08 ENCOUNTER — Encounter: Payer: Self-pay | Admitting: Oncology

## 2024-02-08 ENCOUNTER — Inpatient Hospital Stay: Attending: Oncology

## 2024-02-08 VITALS — BP 161/63 | HR 51 | Temp 98.7°F | Resp 20 | Wt 190.2 lb

## 2024-02-08 DIAGNOSIS — C9110 Chronic lymphocytic leukemia of B-cell type not having achieved remission: Secondary | ICD-10-CM | POA: Insufficient documentation

## 2024-02-08 DIAGNOSIS — Z7984 Long term (current) use of oral hypoglycemic drugs: Secondary | ICD-10-CM | POA: Diagnosis not present

## 2024-02-08 DIAGNOSIS — Z794 Long term (current) use of insulin: Secondary | ICD-10-CM | POA: Diagnosis not present

## 2024-02-08 DIAGNOSIS — D649 Anemia, unspecified: Secondary | ICD-10-CM | POA: Diagnosis not present

## 2024-02-08 DIAGNOSIS — N1832 Chronic kidney disease, stage 3b: Secondary | ICD-10-CM | POA: Insufficient documentation

## 2024-02-08 DIAGNOSIS — D631 Anemia in chronic kidney disease: Secondary | ICD-10-CM

## 2024-02-08 DIAGNOSIS — E1122 Type 2 diabetes mellitus with diabetic chronic kidney disease: Secondary | ICD-10-CM | POA: Insufficient documentation

## 2024-02-08 DIAGNOSIS — Z87891 Personal history of nicotine dependence: Secondary | ICD-10-CM | POA: Insufficient documentation

## 2024-02-08 DIAGNOSIS — D696 Thrombocytopenia, unspecified: Secondary | ICD-10-CM | POA: Insufficient documentation

## 2024-02-08 DIAGNOSIS — I129 Hypertensive chronic kidney disease with stage 1 through stage 4 chronic kidney disease, or unspecified chronic kidney disease: Secondary | ICD-10-CM | POA: Diagnosis present

## 2024-02-08 DIAGNOSIS — Z79899 Other long term (current) drug therapy: Secondary | ICD-10-CM | POA: Insufficient documentation

## 2024-02-08 LAB — CBC WITH DIFFERENTIAL (CANCER CENTER ONLY)
Abs Immature Granulocytes: 0.05 10*3/uL (ref 0.00–0.07)
Basophils Absolute: 0 10*3/uL (ref 0.0–0.1)
Basophils Relative: 0 %
Eosinophils Absolute: 0.1 10*3/uL (ref 0.0–0.5)
Eosinophils Relative: 0 %
HCT: 22.7 % — ABNORMAL LOW (ref 39.0–52.0)
Hemoglobin: 7 g/dL — ABNORMAL LOW (ref 13.0–17.0)
Immature Granulocytes: 0 %
Lymphocytes Relative: 86 %
Lymphs Abs: 15.4 10*3/uL — ABNORMAL HIGH (ref 0.7–4.0)
MCH: 32.4 pg (ref 26.0–34.0)
MCHC: 30.8 g/dL (ref 30.0–36.0)
MCV: 105.1 fL — ABNORMAL HIGH (ref 80.0–100.0)
Monocytes Absolute: 0.2 10*3/uL (ref 0.1–1.0)
Monocytes Relative: 1 %
Neutro Abs: 2.3 10*3/uL (ref 1.7–7.7)
Neutrophils Relative %: 13 %
Platelet Count: 20 10*3/uL — ABNORMAL LOW (ref 150–400)
RBC: 2.16 MIL/uL — ABNORMAL LOW (ref 4.22–5.81)
RDW: 17.4 % — ABNORMAL HIGH (ref 11.5–15.5)
Smear Review: NORMAL
WBC Count: 18 10*3/uL — ABNORMAL HIGH (ref 4.0–10.5)
nRBC: 0 % (ref 0.0–0.2)

## 2024-02-08 LAB — CMP (CANCER CENTER ONLY)
ALT: 24 U/L (ref 0–44)
AST: 20 U/L (ref 15–41)
Albumin: 2.9 g/dL — ABNORMAL LOW (ref 3.5–5.0)
Alkaline Phosphatase: 122 U/L (ref 38–126)
Anion gap: 7 (ref 5–15)
BUN: 64 mg/dL — ABNORMAL HIGH (ref 8–23)
CO2: 18 mmol/L — ABNORMAL LOW (ref 22–32)
Calcium: 7.5 mg/dL — ABNORMAL LOW (ref 8.9–10.3)
Chloride: 110 mmol/L (ref 98–111)
Creatinine: 3.56 mg/dL — ABNORMAL HIGH (ref 0.61–1.24)
GFR, Estimated: 16 mL/min — ABNORMAL LOW (ref >60)
Glucose, Bld: 132 mg/dL — ABNORMAL HIGH (ref 70–99)
Potassium: 4.6 mmol/L (ref 3.5–5.1)
Sodium: 135 mmol/L (ref 135–145)
Total Bilirubin: 0.4 mg/dL (ref 0.0–1.2)
Total Protein: 4.9 g/dL — ABNORMAL LOW (ref 6.5–8.1)

## 2024-02-08 LAB — SAMPLE TO BLOOD BANK

## 2024-02-08 LAB — PREPARE RBC (CROSSMATCH)

## 2024-02-08 NOTE — Progress Notes (Signed)
 [facility]  Telephone:([phone] [phone]  ID: Patrick Bradley Kielbasa Jr. OB: 19-Feb-1934  MR#: [id number]  WNU#:[id number]  Patient Care Team: Yehuda Helms, MD as PCP - General (Internal Medicine) Shellie Dials, MD as Consulting Physician (Oncology)  CHIEF COMPLAINT: CLL, anemia/thrombocytopenia.  INTERVAL HISTORY: Patient returns to clinic today for repeat laboratory work, further evaluation, and consideration of additional blood.  He is tolerating Imbruvica  well without significant side effects.  He continues to have chronic weakness and fatigue.  He continues to have easy bruising. He has no neurologic complaints today.  He denies any chest pain, shortness of breath, cough, or hemoptysis.  He denies any nausea, vomiting, constipation, or diarrhea. He has no urinary complaints.  Patient offers no further specific complaints today.  REVIEW OF SYSTEMS:   Review of Systems  Constitutional:  Positive for malaise/fatigue. Negative for diaphoresis, fever and weight loss.  Respiratory: Negative.  Negative for cough and shortness of breath.   Cardiovascular: Negative.  Negative for chest pain and leg swelling.  Gastrointestinal: Negative.  Negative for abdominal pain, blood in stool and melena.  Genitourinary: Negative.  Negative for dysuria.  Musculoskeletal: Negative.  Negative for back pain.  Skin: Negative.  Negative for rash.  Neurological:  Positive for weakness. Negative for dizziness, sensory change, focal weakness and headaches.  Endo/Heme/Allergies:  Bruises/bleeds easily.  Psychiatric/Behavioral: Negative.  The patient is not nervous/anxious.     As per HPI. Otherwise, a complete review of systems is negative.  PAST MEDICAL HISTORY: Past Medical History:  Diagnosis Date   Anxiety    CKD (chronic kidney disease), stage III (HCC)    CLL (chronic lymphocytic leukemia) (HCC)    Diabetes mellitus without complication (HCC)    Diverticulitis  04/09/2017   History of kidney stones    HLD (hyperlipidemia)    HTN (hypertension)    PKD (polycystic kidney disease)    Sleep apnea    Uncontrolled type 2 diabetes mellitus with hyperglycemia (HCC) 06/28/2018    PAST SURGICAL HISTORY: Past Surgical History:  Procedure Laterality Date   CARDIAC CATHETERIZATION  12/1987   CENTRAL LINE INSERTION N/A 04/11/2017   Procedure: Central Line Insertion;  Surgeon: Jackquelyn Mass, MD;  Location: [facility];  Service: Cardiovascular;  Laterality: N/A;   CERVICAL FUSION     CHOLECYSTECTOMY     CYSTOSCOPY W/ RETROGRADES Left 05/12/2017   Procedure: CYSTOSCOPY WITH RETROGRADE PYELOGRAM;  Surgeon: Bart Born, MD;  Location: [facility];  Service: Urology;  Laterality: Left;   CYSTOSCOPY W/ URETERAL STENT PLACEMENT Left 05/12/2017   Procedure: CYSTOSCOPY WITH STENT REMOVAL;  Surgeon: Bart Born, MD;  Location: [facility];  Service: Urology;  Laterality: Left;   CYSTOSCOPY WITH STENT PLACEMENT Left 04/11/2017   Procedure: CYSTOSCOPY WITH STENT PLACEMENT;  Surgeon: Christina Coyer, MD;  Location: [facility];  Service: Urology;  Laterality: Left;   URETEROSCOPY  05/12/2017   Procedure: URETEROSCOPY;  Surgeon: Bart Born, MD;  Location: [facility];  Service: Urology;;    FAMILY HISTORY: Reported history of ovarian and lung cancer. Diabetes, hypertension.     ADVANCED DIRECTIVES:    HEALTH MAINTENANCE: Social History   Tobacco Use   Smoking status: Former    Types: Cigarettes    Passive exposure: Past   Smokeless tobacco: Never   Tobacco comments:    quit in Feb of 1997  Vaping Use   Vaping status: Never Used  Substance Use Topics   Alcohol use: No  Drug use: No     Colonoscopy:  PAP:  Bone density:  Lipid panel:  Allergies  Allergen Reactions   Celecoxib Other (See Comments)    Increased Blood Pressure   Chlorpromazine Nausea Only   Iodinated Contrast Media Other (See Comments)    Stage 3 kidney  disease, told IV dye would be bad for him   Prednisone Other (See Comments)    Diabetic   Amoxicillin-Pot Clavulanate Rash    Has patient had a PCN reaction causing immediate rash, facial/tongue/throat swelling, SOB or lightheadedness with hypotension: No Has patient had a PCN reaction causing severe rash involving mucus membranes or skin necrosis: No Has patient had a PCN reaction that required hospitalization: No Has patient had a PCN reaction occurring within the last 10 years: Yes If all of the above answers are "NO", then may proceed with Cephalosporin use.    Penicillin V Potassium Rash    Current Outpatient Medications  Medication Sig Dispense Refill   Aflibercept 2 MG/0.05ML SOLN Inject 1 Dose into the eye as directed. One injection into left eye every 8-9 weeks     atorvastatin  (LIPITOR) 10 MG tablet Take 10 mg by mouth at bedtime.      BD PEN NEEDLE NANO 2ND GEN 32G X 4 MM MISC USE AS DIRECTED ONCE A DAY     carvedilol  (COREG ) 12.5 MG tablet Take 12.5 mg by mouth 2 (two) times daily with a meal.     Cholecalciferol (VITAMIN D -3) 5000 units TABS Take 5,000 Units by mouth at bedtime.      clonazePAM  (KLONOPIN ) 1 MG tablet Take 1 mg by mouth at bedtime.      CONTOUR NEXT TEST test strip daily.     cyanocobalamin 500 MCG tablet Take 1,000 mcg by mouth at bedtime.      diphenoxylate -atropine  (LOMOTIL ) 2.5-0.025 MG tablet Take 1 tablet by mouth 4 (four) times daily as needed for diarrhea or loose stools. 60 tablet 1   empagliflozin (JARDIANCE) 10 MG TABS tablet 10 mg daily.     epoetin  alfa-epbx (RETACRIT ) 40000 UNIT/ML injection Inject 40,000 Units into the skin every 6 (six) weeks. Depending on lab results (if needed)     finasteride  (PROSCAR ) 5 MG tablet Take 1 tablet (5 mg total) by mouth daily. 90 tablet 3   folic acid  (FOLVITE ) 400 MCG tablet Take 400 mcg by mouth daily.     ibrutinib  (IMBRUVICA ) 280 MG tablet Take 1 tablet (280 mg total) by mouth daily. Take with a glass of  water. 28 tablet 2   insulin  glargine (LANTUS ) 100 UNIT/ML injection Inject 20-30 Units into the skin every morning. Takes Lantus  20 units if CBG less than 120 mg/dl or Lantus  30 units if CBG is over 120 mg/dl     Multiple Vitamins-Minerals (PRESERVISION AREDS 2 PO) Take 1 capsule by mouth 2 (two) times daily.      prednisoLONE acetate (PRED FORTE) 1 % ophthalmic suspension Place 1 drop into the right eye 4 (four) times daily.     sertraline (ZOLOFT) 50 MG tablet Take 100 mg by mouth daily.     tamsulosin  (FLOMAX ) 0.4 MG CAPS capsule TAKE 1 CAPSULE(0.4 MG) BY MOUTH DAILY AFTER SUPPER 90 capsule 3   telmisartan (MICARDIS) 40 MG tablet Take 40 mg by mouth daily.     torsemide (DEMADEX) 10 MG tablet Take by mouth.     triamcinolone (NASACORT) 55 MCG/ACT AERO nasal inhaler Place 2 sprays into the nose 2 (two) times daily  as needed (allergies).      fexofenadine (ALLEGRA) 180 MG tablet Take 180 mg by mouth daily as needed for allergies. (Patient not taking: Reported on 01/25/2024)     No current facility-administered medications for this visit.   Facility-Administered Medications Ordered in Other Visits  Medication Dose Route Frequency Provider Last Rate Last Admin   epoetin  alfa-epbx (RETACRIT ) injection 40,000 Units  40,000 Units Subcutaneous Once Vivica Dobosz J, MD        OBJECTIVE: Vitals:   02/08/24 1422  BP: (!) 161/63  Pulse: (!) 51  Resp: 20  Temp: 98.7 F (37.1 C)  SpO2: 100%      Body mass index is 31.65 kg/m.    ECOG FS:1 - Symptomatic but completely ambulatory  General: Well-developed, well-nourished, no acute distress.  Sitting in a wheelchair. Eyes: Pink conjunctiva, anicteric sclera. HEENT: Normocephalic, moist mucous membranes. Lungs: No audible wheezing or coughing. Heart: Regular rate and rhythm. Abdomen: Soft, nontender, no obvious distention. Musculoskeletal: No edema, cyanosis, or clubbing. Neuro: Alert, answering all questions appropriately. Cranial nerves  grossly intact. Skin: No rashes or petechiae noted. Psych: Normal affect.  LAB RESULTS:  Lab Results  Component Value Date   NA 135 02/08/2024   K 4.6 02/08/2024   CL 110 02/08/2024   CO2 18 (L) 02/08/2024   GLUCOSE 132 (H) 02/08/2024   BUN 64 (H) 02/08/2024   CREATININE 3.56 (H) 02/08/2024   CALCIUM  7.5 (L) 02/08/2024   PROT 4.9 (L) 02/08/2024   ALBUMIN 2.9 (L) 02/08/2024   AST 20 02/08/2024   ALT 24 02/08/2024   ALKPHOS 122 02/08/2024   BILITOT 0.4 02/08/2024   GFRNONAA 16 (L) 02/08/2024   GFRAA 37 (L) 11/19/2019    Lab Results  Component Value Date   WBC 18.0 (H) 02/08/2024   NEUTROABS 2.3 02/08/2024   HGB 7.0 (L) 02/08/2024   HCT 22.7 (L) 02/08/2024   MCV 105.1 (H) 02/08/2024   PLT 20 (L) 02/08/2024   Lab Results  Component Value Date   IRON 79 01/11/2024   TIBC 304 01/11/2024   IRONPCTSAT 26 01/11/2024   Lab Results  Component Value Date   FERRITIN 142 01/11/2024     STUDIES: No results found.   ASSESSMENT: CLL, anemia/thrombocytopenia.  PLAN:    CLL: Bone marrow biopsy on January 08, 2024 reported 69% clonal B-cell lymphocytes consistent with a diagnosis of CLL.  Patient does not have any peripheral lymphocytosis.  No other pathology was reported.  It is possible patient's chronic thrombocytopenia, worsening anemia, night sweats are related to his CLL, therefore patient was initiated on dose reduced Imbruvica  280 mg daily in early April 2025.  Continue with treatment as prescribed.  Return to clinic tomorrow for 2 units of packed red blood cells.  Patient then return to clinic in 1 week for repeat laboratory work, further evaluation, and continuation of treatment.   Anemia, unspecified: Possibly related to progression of CLL, but we do not have a previous bone marrow biopsy for comparison.  Hemoglobin has declined to 7.0.  Return to clinic tomorrow for 2 units packed red blood cells.  All blood products should be irradiated. Thrombocytopenia: Chronic and  unchanged.  Patient's platelet count is 20 today.  Possibly secondary to Imbruvica .  Monitor.  Bone marrow biopsy as above.  Previously, patient required multiple transfusions of platelets prior to his oral surgery.  He did not have any excessive bleeding during his surgery.  His count was as high as 95 in June 2016.  He does not require transfusion today.   Lymphadenopathy: CT scan result from December 26, 2023 reviewed independently with no obvious lymphadenopathy. Aortic aneurysm: Appreciate vascular surgery input.  Patient elected to hold off any procedures until his aneurysm reaches 5.5 cm. Dental surgery: Successful and resolved. Renal insufficiency: Chronic and unchanged.  Patient's GFR remains at 16.  Continue monitoring and treatment per nephrology.  Imbruvica  does not need to be dose reduced in the setting of renal dysfunction.  Patient expressed understanding and was in agreement with this plan. He also understands that He can call clinic at any time with any questions, concerns, or complaints.    Cancer Staging  Chronic lymphocytic leukemia (HCC) Staging form: Chronic Lymphocytic Leukemia / Small Lymphocytic Lymphoma, AJCC 8th Edition - Clinical stage from 01/12/2024: Modified Rai Stage IV (Modified Rai risk: High, Lymphocytosis: Absent, Adenopathy: Absent, Organomegaly: Absent, Anemia: Present, Thrombocytopenia: Present) - Signed by Shellie Dials, MD on 01/13/2024    Shellie Dials, MD   02/08/2024 3:54 PM

## 2024-02-08 NOTE — Progress Notes (Signed)
 Specialty Pharmacy Ongoing Clinical Assessment Note  Patrick Payne. is a 89 y.o. male who is being followed by the specialty pharmacy service for RxSp Oncology   Patient's specialty medication(s) reviewed today: Ibrutinib  (IMBRUVICA )   Missed doses in the last 4 weeks: 0   Patient/Caregiver did not have any additional questions or concerns.   Therapeutic benefit summary: Patient is achieving benefit   Adverse events/side effects summary: Unable to assess   Patient's therapy is appropriate to: Continue    Goals Addressed             This Visit's Progress    Achieve or maintain remission   No change    Patient is initiating therapy. Patient will maintain adherence         Follow up:  3 months  Malachi Screws Specialty Pharmacist

## 2024-02-08 NOTE — Progress Notes (Signed)
 Specialty Pharmacy Refill Coordination Note  Patrick Payne. is a 89 y.o. male contacted today regarding refills of specialty medication(s) Ibrutinib  (IMBRUVICA )   Patient requested Delivery   Delivery date: 02/14/24   Verified address: [address] [facility] [place] [address]   Medication will be filled on 02/13/24.

## 2024-02-08 NOTE — Progress Notes (Signed)
 Clinical Pharmacist Practitioner Clinic [facility]  [phone] [phone]  Patient Care Team: Yehuda Helms, MD as PCP - General (Internal Medicine) Shellie Dials, MD as Consulting Physician (Oncology)   Name of the patient: Patrick Payne  [id number]  [birth year]   Date of visit: 02/08/24  HPI: Patient is a 89 y.o. male with with CLL. Patient started ibrutinib  on 01/18/24.   Reason for Consult: Oral chemotherapy follow-up for ibrutinib  therapy.   PAST MEDICAL HISTORY: Past Medical History:  Diagnosis Date   Anxiety    CKD (chronic kidney disease), stage III (HCC)    CLL (chronic lymphocytic leukemia) (HCC)    Diabetes mellitus without complication (HCC)    Diverticulitis 04/09/2017   History of kidney stones    HLD (hyperlipidemia)    HTN (hypertension)    PKD (polycystic kidney disease)    Sleep apnea    Uncontrolled type 2 diabetes mellitus with hyperglycemia (HCC) 06/28/2018    HEMATOLOGY/ONCOLOGY HISTORY:  Oncology History  Chronic lymphocytic leukemia (HCC)  04/17/2014 Initial Diagnosis   Chronic lymphocytic leukemia (HCC)   01/12/2024 Cancer Staging   Staging form: Chronic Lymphocytic Leukemia / Small Lymphocytic Lymphoma, AJCC 8th Edition - Clinical stage from 01/12/2024: Modified Rai Stage IV (Modified Rai risk: High, Lymphocytosis: Absent, Adenopathy: Absent, Organomegaly: Absent, Anemia: Present, Thrombocytopenia: Present) - Signed by Shellie Dials, MD on 01/13/2024     ALLERGIES:  is allergic to celecoxib, chlorpromazine, iodinated contrast media, prednisone, amoxicillin-pot clavulanate, and penicillin v potassium.  MEDICATIONS:  Current Outpatient Medications  Medication Sig Dispense Refill   Aflibercept 2 MG/0.05ML SOLN Inject 1 Dose into the eye as directed. One injection into left eye every 8-9 weeks     atorvastatin  (LIPITOR) 10 MG tablet Take 10 mg by mouth at bedtime.      BD PEN NEEDLE NANO 2ND GEN  32G X 4 MM MISC USE AS DIRECTED ONCE A DAY     carvedilol  (COREG ) 12.5 MG tablet Take 12.5 mg by mouth 2 (two) times daily with a meal.     Cholecalciferol (VITAMIN D -3) 5000 units TABS Take 5,000 Units by mouth at bedtime.      clonazePAM  (KLONOPIN ) 1 MG tablet Take 1 mg by mouth at bedtime.      CONTOUR NEXT TEST test strip daily.     cyanocobalamin 500 MCG tablet Take 1,000 mcg by mouth at bedtime.      diphenoxylate -atropine  (LOMOTIL ) 2.5-0.025 MG tablet Take 1 tablet by mouth 4 (four) times daily as needed for diarrhea or loose stools. 60 tablet 1   empagliflozin (JARDIANCE) 10 MG TABS tablet 10 mg daily.     epoetin  alfa-epbx (RETACRIT ) 40000 UNIT/ML injection Inject 40,000 Units into the skin every 6 (six) weeks. Depending on lab results (if needed)     fexofenadine (ALLEGRA) 180 MG tablet Take 180 mg by mouth daily as needed for allergies. (Patient not taking: Reported on 01/25/2024)     finasteride  (PROSCAR ) 5 MG tablet Take 1 tablet (5 mg total) by mouth daily. 90 tablet 3   folic acid  (FOLVITE ) 400 MCG tablet Take 400 mcg by mouth daily.     ibrutinib  (IMBRUVICA ) 280 MG tablet Take 1 tablet (280 mg total) by mouth daily. Take with a glass of water. 28 tablet 2   insulin  glargine (LANTUS ) 100 UNIT/ML injection Inject 20-30 Units into the skin every morning. Takes Lantus  20 units if CBG less than 120 mg/dl or Lantus  30 units if CBG is over  120 mg/dl     Multiple Vitamins-Minerals (PRESERVISION AREDS 2 PO) Take 1 capsule by mouth 2 (two) times daily.      prednisoLONE acetate (PRED FORTE) 1 % ophthalmic suspension Place 1 drop into the right eye 4 (four) times daily.     sertraline (ZOLOFT) 50 MG tablet Take 100 mg by mouth daily.     tamsulosin  (FLOMAX ) 0.4 MG CAPS capsule TAKE 1 CAPSULE(0.4 MG) BY MOUTH DAILY AFTER SUPPER 90 capsule 3   telmisartan (MICARDIS) 40 MG tablet Take 40 mg by mouth daily.     torsemide (DEMADEX) 10 MG tablet Take by mouth.     triamcinolone (NASACORT) 55  MCG/ACT AERO nasal inhaler Place 2 sprays into the nose 2 (two) times daily as needed (allergies).      No current facility-administered medications for this visit.   Facility-Administered Medications Ordered in Other Visits  Medication Dose Route Frequency Provider Last Rate Last Admin   epoetin  alfa-epbx (RETACRIT ) injection 40,000 Units  40,000 Units Subcutaneous Once Finnegan, Timothy J, MD        VITAL SIGNS: There were no vitals taken for this visit. There were no vitals filed for this visit.  Estimated body mass index is 31.65 kg/m as calculated from the following:   Height as of 01/25/24: 5\' 5"  (1.651 m).   Weight as of an earlier encounter on 02/08/24: 86.3 kg (190 lb 3.2 oz).  LABS: CBC:    Component Value Date/Time   WBC 18.0 (H) 02/08/2024 1358   WBC 8.2 01/08/2024 0752   HGB 7.0 (L) 02/08/2024 1358   HGB 12.1 (L) 02/21/2014 1340   HCT 22.7 (L) 02/08/2024 1358   HCT 36.2 (L) 02/21/2014 1340   PLT 20 (L) 02/08/2024 1358   PLT 76 (L) 02/21/2014 1340   MCV 105.1 (H) 02/08/2024 1358   MCV 98 02/21/2014 1340   NEUTROABS 2.3 02/08/2024 1358   NEUTROABS 2.7 02/21/2014 1340   LYMPHSABS 15.4 (H) 02/08/2024 1358   LYMPHSABS 16.9 (H) 02/21/2014 1340   MONOABS 0.2 02/08/2024 1358   MONOABS 0.4 02/21/2014 1340   EOSABS 0.1 02/08/2024 1358   EOSABS 0.3 02/21/2014 1340   BASOSABS 0.0 02/08/2024 1358   BASOSABS 0.1 02/21/2014 1340   Comprehensive Metabolic Panel:    Component Value Date/Time   NA 137 02/01/2024 1401   NA 138 06/09/2017 1110   K 4.7 02/01/2024 1401   CL 111 02/01/2024 1401   CO2 19 (L) 02/01/2024 1401   BUN 63 (H) 02/01/2024 1401   BUN 26 06/09/2017 1110   CREATININE 3.59 (H) 02/01/2024 1401   CREATININE 1.57 (H) 02/21/2014 1340   GLUCOSE 144 (H) 02/01/2024 1401   CALCIUM  7.8 (L) 02/01/2024 1401   AST 17 02/01/2024 1401   ALT 24 02/01/2024 1401   ALKPHOS 124 02/01/2024 1401   BILITOT 0.6 02/01/2024 1401   PROT 4.9 (L) 02/01/2024 1401   ALBUMIN 3.0  (L) 02/01/2024 1401     Present during today's visit: patient and his wife  Assessment and Plan: CBC/CMP reviewed: Continue ibrutinib  280mg  daily  ALC may have peaked last week Patient thinks he has had a slight decrease in fatigue   Oral Chemotherapy Side Effect/Intolerance:  None reported  Oral Chemotherapy Adherence: No missed doses reported No patient barriers to medication adherence identified.   New medications: None reported  Medication Access Issues: No issues  Patient expressed understanding and was in agreement with this plan. He also understands that He can call clinic at any  time with any questions, concerns, or complaints.   Follow-up plan: RTC as scheduled  Thank you for allowing me to participate in the care of this very pleasant patient.   Time Total: 10 mins  Visit consisted of counseling and education on dealing with issues of symptom management in the setting of serious and potentially life-threatening illness.Greater than 50%  of this time was spent counseling and coordinating care related to the above assessment and plan.  Signed by: Braxtin Bamba N. Linsi Humann, PharmD, Lorraine Roses, CPP Hematology/Oncology Clinical Pharmacist Practitioner [facility] [phone]  02/08/2024 2:55 PM

## 2024-02-09 ENCOUNTER — Inpatient Hospital Stay

## 2024-02-09 DIAGNOSIS — C9110 Chronic lymphocytic leukemia of B-cell type not having achieved remission: Secondary | ICD-10-CM

## 2024-02-09 MED ORDER — ACETAMINOPHEN 325 MG PO TABS
650.0000 mg | ORAL_TABLET | Freq: Once | ORAL | Status: AC
  Administered 2024-02-09: 650 mg via ORAL
  Filled 2024-02-09: qty 2

## 2024-02-09 MED ORDER — DIPHENHYDRAMINE HCL 50 MG/ML IJ SOLN
25.0000 mg | Freq: Once | INTRAMUSCULAR | Status: AC
  Administered 2024-02-09: 25 mg via INTRAVENOUS
  Filled 2024-02-09: qty 1

## 2024-02-09 MED ORDER — SODIUM CHLORIDE 0.9% IV SOLUTION
250.0000 mL | INTRAVENOUS | Status: DC
  Administered 2024-02-09: 250 mL via INTRAVENOUS
  Filled 2024-02-09: qty 250

## 2024-02-10 LAB — TYPE AND SCREEN
ABO/RH(D): A POS
Antibody Screen: NEGATIVE
Unit division: 0
Unit division: 0

## 2024-02-10 LAB — BPAM RBC
Blood Product Expiration Date: 202505072359
Blood Product Expiration Date: 202505072359
ISSUE DATE / TIME: 202505021152
ISSUE DATE / TIME: 202505021342
Unit Type and Rh: 5100
Unit Type and Rh: 9500

## 2024-02-15 ENCOUNTER — Inpatient Hospital Stay

## 2024-02-15 ENCOUNTER — Inpatient Hospital Stay: Admitting: Oncology

## 2024-02-15 ENCOUNTER — Encounter: Payer: Self-pay | Admitting: Oncology

## 2024-02-15 VITALS — BP 137/53 | HR 55 | Temp 97.0°F | Resp 17 | Wt 192.0 lb

## 2024-02-15 DIAGNOSIS — C9110 Chronic lymphocytic leukemia of B-cell type not having achieved remission: Secondary | ICD-10-CM

## 2024-02-15 DIAGNOSIS — N189 Chronic kidney disease, unspecified: Secondary | ICD-10-CM

## 2024-02-15 DIAGNOSIS — D696 Thrombocytopenia, unspecified: Secondary | ICD-10-CM

## 2024-02-15 LAB — CMP (CANCER CENTER ONLY)
ALT: 25 U/L (ref 0–44)
AST: 19 U/L (ref 15–41)
Albumin: 2.8 g/dL — ABNORMAL LOW (ref 3.5–5.0)
Alkaline Phosphatase: 118 U/L (ref 38–126)
Anion gap: 8 (ref 5–15)
BUN: 67 mg/dL — ABNORMAL HIGH (ref 8–23)
CO2: 18 mmol/L — ABNORMAL LOW (ref 22–32)
Calcium: 7.4 mg/dL — ABNORMAL LOW (ref 8.9–10.3)
Chloride: 110 mmol/L (ref 98–111)
Creatinine: 3.38 mg/dL — ABNORMAL HIGH (ref 0.61–1.24)
GFR, Estimated: 17 mL/min — ABNORMAL LOW (ref >60)
Glucose, Bld: 122 mg/dL — ABNORMAL HIGH (ref 70–99)
Potassium: 4.5 mmol/L (ref 3.5–5.1)
Sodium: 136 mmol/L (ref 135–145)
Total Bilirubin: 0.9 mg/dL (ref 0.0–1.2)
Total Protein: 4.7 g/dL — ABNORMAL LOW (ref 6.5–8.1)

## 2024-02-15 LAB — CBC WITH DIFFERENTIAL (CANCER CENTER ONLY)
Abs Immature Granulocytes: 0.03 10*3/uL (ref 0.00–0.07)
Basophils Absolute: 0 10*3/uL (ref 0.0–0.1)
Basophils Relative: 0 %
Eosinophils Absolute: 0.1 10*3/uL (ref 0.0–0.5)
Eosinophils Relative: 1 %
HCT: 22.7 % — ABNORMAL LOW (ref 39.0–52.0)
Hemoglobin: 7.2 g/dL — ABNORMAL LOW (ref 13.0–17.0)
Immature Granulocytes: 0 %
Lymphocytes Relative: 86 %
Lymphs Abs: 15.2 10*3/uL — ABNORMAL HIGH (ref 0.7–4.0)
MCH: 32.4 pg (ref 26.0–34.0)
MCHC: 31.7 g/dL (ref 30.0–36.0)
MCV: 102.3 fL — ABNORMAL HIGH (ref 80.0–100.0)
Monocytes Absolute: 0.2 10*3/uL (ref 0.1–1.0)
Monocytes Relative: 1 %
Neutro Abs: 2.2 10*3/uL (ref 1.7–7.7)
Neutrophils Relative %: 12 %
Platelet Count: 18 10*3/uL — ABNORMAL LOW (ref 150–400)
RBC: 2.22 MIL/uL — ABNORMAL LOW (ref 4.22–5.81)
RDW: 18.1 % — ABNORMAL HIGH (ref 11.5–15.5)
WBC Count: 17.6 10*3/uL — ABNORMAL HIGH (ref 4.0–10.5)
nRBC: 0 % (ref 0.0–0.2)

## 2024-02-15 LAB — SAMPLE TO BLOOD BANK

## 2024-02-15 LAB — PREPARE RBC (CROSSMATCH)

## 2024-02-15 NOTE — Progress Notes (Signed)
 [facility]  [phone] [phone]  ID: Patrick Bradley Ferg Jr. OB: 03-Dec-1933  MR#: [id number]  AOZ#:[id number]  Patient Care Team: Yehuda Helms, MD as PCP - General (Internal Medicine) Shellie Dials, MD as Consulting Physician (Oncology)  CHIEF COMPLAINT: CLL, anemia/thrombocytopenia.  INTERVAL HISTORY: Patient returns to clinic today for repeat laboratory work, further evaluation, and consideration of additional blood.  He continues to have chronic weakness and fatigue.  He is tolerating Imbruvica  without significant side effects. He continues to have easy bruising. He has no neurologic complaints today.  He denies any chest pain, shortness of breath, cough, or hemoptysis.  He denies any nausea, vomiting, constipation, or diarrhea. He has no urinary complaints.  Patient offers no further specific complaints today.  REVIEW OF SYSTEMS:   Review of Systems  Constitutional:  Positive for malaise/fatigue. Negative for diaphoresis, fever and weight loss.  Respiratory: Negative.  Negative for cough and shortness of breath.   Cardiovascular: Negative.  Negative for chest pain and leg swelling.  Gastrointestinal: Negative.  Negative for abdominal pain, blood in stool and melena.  Genitourinary: Negative.  Negative for dysuria.  Musculoskeletal: Negative.  Negative for back pain.  Skin: Negative.  Negative for rash.  Neurological:  Positive for weakness. Negative for dizziness, sensory change, focal weakness and headaches.  Endo/Heme/Allergies:  Bruises/bleeds easily.  Psychiatric/Behavioral: Negative.  The patient is not nervous/anxious.     As per HPI. Otherwise, a complete review of systems is negative.  PAST MEDICAL HISTORY: Past Medical History:  Diagnosis Date   Anxiety    CKD (chronic kidney disease), stage III (HCC)    CLL (chronic lymphocytic leukemia) (HCC)    Diabetes mellitus without complication (HCC)    Diverticulitis  04/09/2017   History of kidney stones    HLD (hyperlipidemia)    HTN (hypertension)    PKD (polycystic kidney disease)    Sleep apnea    Uncontrolled type 2 diabetes mellitus with hyperglycemia (HCC) 06/28/2018    PAST SURGICAL HISTORY: Past Surgical History:  Procedure Laterality Date   CARDIAC CATHETERIZATION  12/1987   CENTRAL LINE INSERTION N/A 04/11/2017   Procedure: Central Line Insertion;  Surgeon: Jackquelyn Mass, MD;  Location: [facility] INVASIVE CV LAB;  Service: Cardiovascular;  Laterality: N/A;   CERVICAL FUSION     CHOLECYSTECTOMY     CYSTOSCOPY W/ RETROGRADES Left 05/12/2017   Procedure: CYSTOSCOPY WITH RETROGRADE PYELOGRAM;  Surgeon: Bart Born, MD;  Location: [facility] ORS;  Service: Urology;  Laterality: Left;   CYSTOSCOPY W/ URETERAL STENT PLACEMENT Left 05/12/2017   Procedure: CYSTOSCOPY WITH STENT REMOVAL;  Surgeon: Bart Born, MD;  Location: [facility] ORS;  Service: Urology;  Laterality: Left;   CYSTOSCOPY WITH STENT PLACEMENT Left 04/11/2017   Procedure: CYSTOSCOPY WITH STENT PLACEMENT;  Surgeon: Christina Coyer, MD;  Location: [facility] ORS;  Service: Urology;  Laterality: Left;   URETEROSCOPY  05/12/2017   Procedure: URETEROSCOPY;  Surgeon: Bart Born, MD;  Location: [facility] ORS;  Service: Urology;;    FAMILY HISTORY: Reported history of ovarian and lung cancer. Diabetes, hypertension.     ADVANCED DIRECTIVES:    HEALTH MAINTENANCE: Social History   Tobacco Use   Smoking status: Former    Types: Cigarettes    Passive exposure: Past   Smokeless tobacco: Never   Tobacco comments:    quit in Feb of 1997  Vaping Use   Vaping status: Never Used  Substance Use Topics   Alcohol use: No  Drug use: No     Colonoscopy:  PAP:  Bone density:  Lipid panel:  Allergies  Allergen Reactions   Celecoxib Other (See Comments)    Increased Blood Pressure   Chlorpromazine Nausea Only   Iodinated Contrast Media Other (See Comments)    Stage 3 kidney  disease, told IV dye would be bad for him   Prednisone Other (See Comments)    Diabetic   Amoxicillin-Pot Clavulanate Rash    Has patient had a PCN reaction causing immediate rash, facial/tongue/throat swelling, SOB or lightheadedness with hypotension: No Has patient had a PCN reaction causing severe rash involving mucus membranes or skin necrosis: No Has patient had a PCN reaction that required hospitalization: No Has patient had a PCN reaction occurring within the last 10 years: Yes If all of the above answers are "NO", then may proceed with Cephalosporin use.    Penicillin V Potassium Rash    Current Outpatient Medications  Medication Sig Dispense Refill   Aflibercept 2 MG/0.05ML SOLN Inject 1 Dose into the eye as directed. One injection into left eye every 8-9 weeks     atorvastatin  (LIPITOR) 10 MG tablet Take 10 mg by mouth at bedtime.      BD PEN NEEDLE NANO 2ND GEN 32G X 4 MM MISC USE AS DIRECTED ONCE A DAY     carvedilol  (COREG ) 12.5 MG tablet Take 12.5 mg by mouth 2 (two) times daily with a meal.     Cholecalciferol (VITAMIN D -3) 5000 units TABS Take 5,000 Units by mouth at bedtime.      clonazePAM  (KLONOPIN ) 1 MG tablet Take 1 mg by mouth at bedtime.      CONTOUR NEXT TEST test strip daily.     cyanocobalamin 500 MCG tablet Take 1,000 mcg by mouth at bedtime.      diphenoxylate -atropine  (LOMOTIL ) 2.5-0.025 MG tablet Take 1 tablet by mouth 4 (four) times daily as needed for diarrhea or loose stools. 60 tablet 1   empagliflozin (JARDIANCE) 10 MG TABS tablet 10 mg daily.     epoetin  alfa-epbx (RETACRIT ) 40000 UNIT/ML injection Inject 40,000 Units into the skin every 6 (six) weeks. Depending on lab results (if needed)     finasteride  (PROSCAR ) 5 MG tablet Take 1 tablet (5 mg total) by mouth daily. 90 tablet 3   folic acid  (FOLVITE ) 400 MCG tablet Take 400 mcg by mouth daily.     ibrutinib  (IMBRUVICA ) 280 MG tablet Take 1 tablet (280 mg total) by mouth daily. Take with a glass of  water. 28 tablet 2   insulin  glargine (LANTUS ) 100 UNIT/ML injection Inject 20-30 Units into the skin every morning. Takes Lantus  20 units if CBG less than 120 mg/dl or Lantus  30 units if CBG is over 120 mg/dl     Multiple Vitamins-Minerals (PRESERVISION AREDS 2 PO) Take 1 capsule by mouth 2 (two) times daily.      prednisoLONE acetate (PRED FORTE) 1 % ophthalmic suspension Place 1 drop into the right eye 4 (four) times daily.     sertraline (ZOLOFT) 50 MG tablet Take 100 mg by mouth daily.     tamsulosin  (FLOMAX ) 0.4 MG CAPS capsule TAKE 1 CAPSULE(0.4 MG) BY MOUTH DAILY AFTER SUPPER 90 capsule 3   telmisartan (MICARDIS) 40 MG tablet Take 40 mg by mouth daily.     torsemide (DEMADEX) 10 MG tablet Take by mouth.     triamcinolone (NASACORT) 55 MCG/ACT AERO nasal inhaler Place 2 sprays into the nose 2 (two) times daily  as needed (allergies).      fexofenadine (ALLEGRA) 180 MG tablet Take 180 mg by mouth daily as needed for allergies. (Patient not taking: Reported on 01/25/2024)     No current facility-administered medications for this visit.   Facility-Administered Medications Ordered in Other Visits  Medication Dose Route Frequency Provider Last Rate Last Admin   epoetin  alfa-epbx (RETACRIT ) injection 40,000 Units  40,000 Units Subcutaneous Once Shellie Dials, MD        OBJECTIVE: Vitals:   02/15/24 1020  BP: (!) 137/53  Pulse: (!) 55  Resp: 17  Temp: (!) 97 F (36.1 C)  SpO2: 100%       Body mass index is 31.95 kg/m.    ECOG FS:1 - Symptomatic but completely ambulatory  General: Well-developed, well-nourished, no acute distress.  Sitting in a wheelchair. Eyes: Pink conjunctiva, anicteric sclera. HEENT: Normocephalic, moist mucous membranes. Lungs: No audible wheezing or coughing. Heart: Regular rate and rhythm. Abdomen: Soft, nontender, no obvious distention. Musculoskeletal: No edema, cyanosis, or clubbing. Neuro: Alert, answering all questions appropriately. Cranial  nerves grossly intact. Skin: No rashes or petechiae noted.  Ecchymosis noted. Psych: Normal affect.  LAB RESULTS:  Lab Results  Component Value Date   NA 135 02/08/2024   K 4.6 02/08/2024   CL 110 02/08/2024   CO2 18 (L) 02/08/2024   GLUCOSE 132 (H) 02/08/2024   BUN 64 (H) 02/08/2024   CREATININE 3.56 (H) 02/08/2024   CALCIUM  7.5 (L) 02/08/2024   PROT 4.9 (L) 02/08/2024   ALBUMIN 2.9 (L) 02/08/2024   AST 20 02/08/2024   ALT 24 02/08/2024   ALKPHOS 122 02/08/2024   BILITOT 0.4 02/08/2024   GFRNONAA 16 (L) 02/08/2024   GFRAA 37 (L) 11/19/2019    Lab Results  Component Value Date   WBC 17.6 (H) 02/15/2024   NEUTROABS 2.2 02/15/2024   HGB 7.2 (L) 02/15/2024   HCT 22.7 (L) 02/15/2024   MCV 102.3 (H) 02/15/2024   PLT 18 (L) 02/15/2024   Lab Results  Component Value Date   IRON 79 01/11/2024   TIBC 304 01/11/2024   IRONPCTSAT 26 01/11/2024   Lab Results  Component Value Date   FERRITIN 142 01/11/2024     STUDIES: No results found.   ASSESSMENT: CLL, anemia/thrombocytopenia.  PLAN:    CLL: Bone marrow biopsy on January 08, 2024 reported 69% clonal B-cell lymphocytes consistent with a diagnosis of CLL.  Patient does not have any peripheral lymphocytosis.  No other pathology was reported.  It is possible patient's chronic thrombocytopenia, worsening anemia, night sweats are related to his CLL, therefore patient was initiated on dose reduced Imbruvica  280 mg daily in early April 2025.  Continue with treatment as prescribed.  Return to clinic tomorrow for 2 units of packed red blood cells.  Patient will then return to clinic in 1 week for further evaluation and continuation of treatment. Anemia, unspecified: Possibly related to progression of CLL, but we do not have a previous bone marrow biopsy for comparison.  Despite 2 units of blood last week.  Patient's hemoglobin is 7.2 today.  Return to clinic tomorrow for 2 additional units of packed red blood cells.  All blood  products should be irradiated. Thrombocytopenia: Chronic and unchanged.  Patient's platelet count is 18 today.  Monitor.  Bone marrow biopsy as above.  Previously, patient required multiple transfusions of platelets prior to his oral surgery.  He did not have any excessive bleeding during his surgery.  His count was as  high as 95 in June 2016.  He does not require transfusion today.   Lymphadenopathy: CT scan result from December 26, 2023 reviewed independently with no obvious lymphadenopathy. Aortic aneurysm: Appreciate vascular surgery input.  Patient elected to hold off any procedures until his aneurysm reaches 5.5 cm. Dental surgery: Successful and resolved. Renal insufficiency: Chronic and unchanged.  Patient's most recent GFR remained at 16.  Continue monitoring and treatment per nephrology.  Imbruvica  does not need to be dose reduced in the setting of renal dysfunction.  Patient expressed understanding and was in agreement with this plan. He also understands that He can call clinic at any time with any questions, concerns, or complaints.    Cancer Staging  Chronic lymphocytic leukemia (HCC) Staging form: Chronic Lymphocytic Leukemia / Small Lymphocytic Lymphoma, AJCC 8th Edition - Clinical stage from 01/12/2024: Modified Rai Stage IV (Modified Rai risk: High, Lymphocytosis: Absent, Adenopathy: Absent, Organomegaly: Absent, Anemia: Present, Thrombocytopenia: Present) - Signed by Shellie Dials, MD on 01/13/2024    Shellie Dials, MD   02/15/2024 4:26 PM

## 2024-02-16 ENCOUNTER — Inpatient Hospital Stay

## 2024-02-16 DIAGNOSIS — C9110 Chronic lymphocytic leukemia of B-cell type not having achieved remission: Secondary | ICD-10-CM

## 2024-02-16 MED ORDER — DIPHENHYDRAMINE HCL 50 MG/ML IJ SOLN
25.0000 mg | Freq: Once | INTRAMUSCULAR | Status: AC
Start: 2024-02-16 — End: 2024-02-16
  Administered 2024-02-16: 25 mg via INTRAVENOUS
  Filled 2024-02-16: qty 1

## 2024-02-16 MED ORDER — SODIUM CHLORIDE 0.9% IV SOLUTION
250.0000 mL | INTRAVENOUS | Status: DC
Start: 2024-02-16 — End: 2024-02-16
  Administered 2024-02-16: 100 mL via INTRAVENOUS
  Filled 2024-02-16: qty 250

## 2024-02-16 MED ORDER — ACETAMINOPHEN 325 MG PO TABS
650.0000 mg | ORAL_TABLET | Freq: Once | ORAL | Status: AC
  Administered 2024-02-16: 650 mg via ORAL
  Filled 2024-02-16: qty 2

## 2024-02-16 NOTE — Progress Notes (Signed)
 [facility] Clinical Social Work  Visual merchandiser met with patient and his wife, Patrick Payne, in infusion to offer support and assess for needs.    Patient appeared to be sleeping comfortably per his wife.  Explained CSW role, including Advance Care Planning.  Spouse stated they had their advance directives in their charts.  She expressed no other needs.     Kennth Peal, LCSW  Clinical Social Worker [facility]

## 2024-02-16 NOTE — Patient Instructions (Signed)
 Blood Transfusion, Adult A blood transfusion is a procedure in which you receive blood through an IV tube. You may need this procedure because of: A bleeding disorder. An illness. An injury. A surgery. The blood may come from someone else (a donor). You may also be able to donate blood for yourself before a surgery. The blood given in a transfusion may be made up of different types of cells. You may get: Red blood cells. These carry oxygen to the cells in the body. Platelets. These help your blood to clot. Plasma. This is the liquid part of your blood. It carries proteins and other substances through the body. White blood cells. These help you fight infections. If you have a clotting disorder, you may also get other types of blood products. Depending on the type of blood product, this procedure may take 1-4 hours to complete. Tell your doctor about: Any bleeding problems you have. Any reactions you have had during a blood transfusion in the past. Any allergies you have. All medicines you are taking, including vitamins, herbs, eye drops, creams, and over-the-counter medicines. Any surgeries you have had. Any medical conditions you have. Whether you are pregnant or may be pregnant. What are the risks? Talk with your health care provider about risks. The most common problems include: A mild allergic reaction. This includes red, swollen areas of skin (hives) and itching. Fever or chills. This may be the body's response to new blood cells received. This may happen during or up to 4 hours after the transfusion. More serious problems may include: A serious allergic reaction. This includes breathing trouble or swelling around the face and lips. Too much fluid in the lungs. This may cause breathing problems. Lung injury. This causes breathing trouble and low oxygen in the blood. This can happen within hours of the transfusion or days later. Too much iron. This can happen after getting many blood  transfusions over a period of time. An infection or virus passed through the blood. This is rare. Donated blood is carefully tested before it is given. Your body's defense system (immune system) trying to attack the new blood cells. This is rare. Symptoms may include fever, chills, nausea, low blood pressure, and low back or chest pain. Donated cells attacking healthy tissues. This is rare. What happens before the procedure? You will have a blood test to find out your blood type. The test also finds out what type of blood your body will accept and matches it to the donor type. If you are going to have a planned surgery, you may be able to donate your own blood. This may be done in case you need a transfusion. You will have your temperature, blood pressure, and pulse checked. You may receive medicine to help prevent an allergic reaction. This may be done if you have had a reaction to a transfusion before. This medicine may be given to you by mouth or through an IV tube. What happens during the procedure?  An IV tube will be put into one of your veins. The bag of blood will be attached to your IV tube. Then, the blood will enter through your vein. Your temperature, blood pressure, and pulse will be checked often. This is done to find early signs of a transfusion reaction. Tell your nurse right away if you have any of these symptoms: Shortness of breath or trouble breathing. Chest or back pain. Fever or chills. Red, swollen areas of skin or itching. If you have any signs  or symptoms of a reaction, your transfusion will be stopped. You may also be given medicine. When the transfusion is finished, your IV tube will be taken out. Pressure may be put on the IV site for a few minutes. A bandage (dressing) will be put on the IV site. The procedure may vary among doctors and hospitals. What happens after the procedure? You will be monitored until you leave the hospital or clinic. This includes  checking your temperature, blood pressure, pulse, breathing rate, and blood oxygen level. Your blood may be tested to see how you have responded to the transfusion. You may be warmed with fluids or blankets. This is done to keep the temperature of your body normal. If you have your procedure in an outpatient setting, you will be told whom to contact to report any reactions. Where to find more information Visit the [employer]: redcross.org Summary A blood transfusion is a procedure in which you receive blood through an IV tube. The blood you are given may be made up of different blood cells. You may receive red blood cells, platelets, plasma, or white blood cells. Your temperature, blood pressure, and pulse will be checked often. After the procedure, your blood may be tested to see how you have responded. This information is not intended to replace advice given to you by your health care provider. Make sure you discuss any questions you have with your health care provider. Document Revised: 12/24/2021 Document Reviewed: 12/24/2021 Elsevier Patient Education  2024 [employer].

## 2024-02-17 LAB — TYPE AND SCREEN
ABO/RH(D): A POS
Antibody Screen: NEGATIVE
Unit division: 0
Unit division: 0

## 2024-02-17 LAB — BPAM RBC
Blood Product Expiration Date: 202505162359
Blood Product Expiration Date: 202505302359
ISSUE DATE / TIME: 202505090836
ISSUE DATE / TIME: 202505091027
Unit Type and Rh: 5100
Unit Type and Rh: 6200

## 2024-02-21 ENCOUNTER — Other Ambulatory Visit: Payer: Self-pay | Admitting: *Deleted

## 2024-02-21 DIAGNOSIS — C9110 Chronic lymphocytic leukemia of B-cell type not having achieved remission: Secondary | ICD-10-CM

## 2024-02-22 ENCOUNTER — Other Ambulatory Visit: Payer: Self-pay | Admitting: *Deleted

## 2024-02-22 ENCOUNTER — Inpatient Hospital Stay (HOSPITAL_BASED_OUTPATIENT_CLINIC_OR_DEPARTMENT_OTHER): Admitting: Oncology

## 2024-02-22 ENCOUNTER — Inpatient Hospital Stay

## 2024-02-22 ENCOUNTER — Encounter: Payer: Self-pay | Admitting: Oncology

## 2024-02-22 VITALS — BP 152/57 | HR 55 | Temp 97.0°F | Resp 16 | Ht 65.0 in | Wt 195.0 lb

## 2024-02-22 DIAGNOSIS — C9110 Chronic lymphocytic leukemia of B-cell type not having achieved remission: Secondary | ICD-10-CM | POA: Diagnosis not present

## 2024-02-22 DIAGNOSIS — D696 Thrombocytopenia, unspecified: Secondary | ICD-10-CM | POA: Diagnosis not present

## 2024-02-22 DIAGNOSIS — N189 Chronic kidney disease, unspecified: Secondary | ICD-10-CM

## 2024-02-22 DIAGNOSIS — D631 Anemia in chronic kidney disease: Secondary | ICD-10-CM

## 2024-02-22 LAB — CBC WITH DIFFERENTIAL/PLATELET
Abs Immature Granulocytes: 0.03 10*3/uL (ref 0.00–0.07)
Basophils Absolute: 0.1 10*3/uL (ref 0.0–0.1)
Basophils Relative: 0 %
Eosinophils Absolute: 0.1 10*3/uL (ref 0.0–0.5)
Eosinophils Relative: 0 %
HCT: 24.4 % — ABNORMAL LOW (ref 39.0–52.0)
Hemoglobin: 7.6 g/dL — ABNORMAL LOW (ref 13.0–17.0)
Immature Granulocytes: 0 %
Lymphocytes Relative: 87 %
Lymphs Abs: 14.2 10*3/uL — ABNORMAL HIGH (ref 0.7–4.0)
MCH: 31.7 pg (ref 26.0–34.0)
MCHC: 31.1 g/dL (ref 30.0–36.0)
MCV: 101.7 fL — ABNORMAL HIGH (ref 80.0–100.0)
Monocytes Absolute: 0.2 10*3/uL (ref 0.1–1.0)
Monocytes Relative: 1 %
Neutro Abs: 2 10*3/uL (ref 1.7–7.7)
Neutrophils Relative %: 12 %
Platelets: 19 10*3/uL — ABNORMAL LOW (ref 150–400)
RBC: 2.4 MIL/uL — ABNORMAL LOW (ref 4.22–5.81)
RDW: 17.9 % — ABNORMAL HIGH (ref 11.5–15.5)
WBC: 16.5 10*3/uL — ABNORMAL HIGH (ref 4.0–10.5)
nRBC: 0 % (ref 0.0–0.2)

## 2024-02-22 LAB — CMP (CANCER CENTER ONLY)
ALT: 24 U/L (ref 0–44)
AST: 21 U/L (ref 15–41)
Albumin: 2.8 g/dL — ABNORMAL LOW (ref 3.5–5.0)
Alkaline Phosphatase: 114 U/L (ref 38–126)
Anion gap: 7 (ref 5–15)
BUN: 67 mg/dL — ABNORMAL HIGH (ref 8–23)
CO2: 18 mmol/L — ABNORMAL LOW (ref 22–32)
Calcium: 7.5 mg/dL — ABNORMAL LOW (ref 8.9–10.3)
Chloride: 110 mmol/L (ref 98–111)
Creatinine: 3.5 mg/dL — ABNORMAL HIGH (ref 0.61–1.24)
GFR, Estimated: 16 mL/min — ABNORMAL LOW (ref >60)
Glucose, Bld: 138 mg/dL — ABNORMAL HIGH (ref 70–99)
Potassium: 4.9 mmol/L (ref 3.5–5.1)
Sodium: 135 mmol/L (ref 135–145)
Total Bilirubin: 0.7 mg/dL (ref 0.0–1.2)
Total Protein: 4.7 g/dL — ABNORMAL LOW (ref 6.5–8.1)

## 2024-02-22 LAB — SAMPLE TO BLOOD BANK

## 2024-02-22 NOTE — Progress Notes (Signed)
 Having blood blisters occur on his tongue as well as random bruising that he is concerned about.

## 2024-02-22 NOTE — Progress Notes (Signed)
 [facility]  Telephone:([phone] [phone]  ID: Patrick Payne. OB: Mar 04, 1934  MR#: [id number]  ZDG#:[id number]  Patient Care Team: Yehuda Helms, MD as PCP - General (Internal Medicine) Shellie Dials, MD as Consulting Physician (Oncology)  CHIEF COMPLAINT: CLL, anemia/thrombocytopenia.  INTERVAL HISTORY: Patient returns to clinic today for repeat laboratory work routine weekly evaluation, consideration of additional blood.  He continues to have chronic weakness and fatigue.  He continues to have easy bruising. He is tolerating Imbruvica  without significant side effects.  He has no neurologic complaints today.  He denies any chest pain, shortness of breath, cough, or hemoptysis.  He denies any nausea, vomiting, constipation, or diarrhea. He has no urinary complaints.  Patient offers no further specific complaints today.  REVIEW OF SYSTEMS:   Review of Systems  Constitutional:  Positive for malaise/fatigue. Negative for diaphoresis, fever and weight loss.  Respiratory: Negative.  Negative for cough and shortness of breath.   Cardiovascular: Negative.  Negative for chest pain and leg swelling.  Gastrointestinal: Negative.  Negative for abdominal pain, blood in stool and melena.  Genitourinary: Negative.  Negative for dysuria.  Musculoskeletal: Negative.  Negative for back pain.  Skin: Negative.  Negative for rash.  Neurological:  Positive for weakness. Negative for dizziness, sensory change, focal weakness and headaches.  Endo/Heme/Allergies:  Bruises/bleeds easily.  Psychiatric/Behavioral: Negative.  The patient is not nervous/anxious.     As per HPI. Otherwise, a complete review of systems is negative.  PAST MEDICAL HISTORY: Past Medical History:  Diagnosis Date   Anxiety    CKD (chronic kidney disease), stage III (HCC)    CLL (chronic lymphocytic leukemia) (HCC)    Diabetes mellitus without complication (HCC)    Diverticulitis  04/09/2017   History of kidney stones    HLD (hyperlipidemia)    HTN (hypertension)    PKD (polycystic kidney disease)    Sleep apnea    Uncontrolled type 2 diabetes mellitus with hyperglycemia (HCC) 06/28/2018    PAST SURGICAL HISTORY: Past Surgical History:  Procedure Laterality Date   CARDIAC CATHETERIZATION  12/1987   CENTRAL LINE INSERTION N/A 04/11/2017   Procedure: Central Line Insertion;  Surgeon: Jackquelyn Mass, MD;  Location: [facility];  Service: Cardiovascular;  Laterality: N/A;   CERVICAL FUSION     CHOLECYSTECTOMY     CYSTOSCOPY W/ RETROGRADES Left 05/12/2017   Procedure: CYSTOSCOPY WITH RETROGRADE PYELOGRAM;  Surgeon: Bart Born, MD;  Location: [facility];  Service: Urology;  Laterality: Left;   CYSTOSCOPY W/ URETERAL STENT PLACEMENT Left 05/12/2017   Procedure: CYSTOSCOPY WITH STENT REMOVAL;  Surgeon: Bart Born, MD;  Location: [facility];  Service: Urology;  Laterality: Left;   CYSTOSCOPY WITH STENT PLACEMENT Left 04/11/2017   Procedure: CYSTOSCOPY WITH STENT PLACEMENT;  Surgeon: Christina Coyer, MD;  Location: [facility];  Service: Urology;  Laterality: Left;   URETEROSCOPY  05/12/2017   Procedure: URETEROSCOPY;  Surgeon: Bart Born, MD;  Location: [facility];  Service: Urology;;    FAMILY HISTORY: Reported history of ovarian and lung cancer. Diabetes, hypertension.     ADVANCED DIRECTIVES:    HEALTH MAINTENANCE: Social History   Tobacco Use   Smoking status: Former    Types: Cigarettes    Passive exposure: Past   Smokeless tobacco: Never   Tobacco comments:    quit in Feb of 1997  Vaping Use   Vaping status: Never Used  Substance Use Topics   Alcohol use: No  Drug use: No     Colonoscopy:  PAP:  Bone density:  Lipid panel:  Allergies  Allergen Reactions   Celecoxib Other (See Comments)    Increased Blood Pressure   Chlorpromazine Nausea Only   Iodinated Contrast Media Other (See Comments)    Stage 3 kidney  disease, told IV dye would be bad for him   Prednisone Other (See Comments)    Diabetic   Amoxicillin-Pot Clavulanate Rash    Has patient had a PCN reaction causing immediate rash, facial/tongue/throat swelling, SOB or lightheadedness with hypotension: No Has patient had a PCN reaction causing severe rash involving mucus membranes or skin necrosis: No Has patient had a PCN reaction that required hospitalization: No Has patient had a PCN reaction occurring within the last 10 years: Yes If all of the above answers are "NO", then may proceed with Cephalosporin use.    Penicillin V Potassium Rash    Current Outpatient Medications  Medication Sig Dispense Refill   Aflibercept 2 MG/0.05ML SOLN Inject 1 Dose into the eye as directed. One injection into left eye every 8-9 weeks     atorvastatin  (LIPITOR) 10 MG tablet Take 10 mg by mouth at bedtime.      BD PEN NEEDLE NANO 2ND GEN 32G X 4 MM MISC USE AS DIRECTED ONCE A DAY     carvedilol  (COREG ) 12.5 MG tablet Take 12.5 mg by mouth 2 (two) times daily with a meal.     Cholecalciferol (VITAMIN D -3) 5000 units TABS Take 5,000 Units by mouth at bedtime.      clonazePAM  (KLONOPIN ) 1 MG tablet Take 1 mg by mouth at bedtime.      CONTOUR NEXT TEST test strip daily.     cyanocobalamin 500 MCG tablet Take 1,000 mcg by mouth at bedtime.      diphenoxylate -atropine  (LOMOTIL ) 2.5-0.025 MG tablet Take 1 tablet by mouth 4 (four) times daily as needed for diarrhea or loose stools. 60 tablet 1   empagliflozin (JARDIANCE) 10 MG TABS tablet 10 mg daily.     epoetin  alfa-epbx (RETACRIT ) 40000 UNIT/ML injection Inject 40,000 Units into the skin every 6 (six) weeks. Depending on lab results (if needed)     finasteride  (PROSCAR ) 5 MG tablet Take 1 tablet (5 mg total) by mouth daily. 90 tablet 3   folic acid  (FOLVITE ) 400 MCG tablet Take 400 mcg by mouth daily.     ibrutinib  (IMBRUVICA ) 280 MG tablet Take 1 tablet (280 mg total) by mouth daily. Take with a glass of  water. 28 tablet 2   insulin  glargine (LANTUS ) 100 UNIT/ML injection Inject 20-30 Units into the skin every morning. Takes Lantus  20 units if CBG less than 120 mg/dl or Lantus  30 units if CBG is over 120 mg/dl     Multiple Vitamins-Minerals (PRESERVISION AREDS 2 PO) Take 1 capsule by mouth 2 (two) times daily.      prednisoLONE acetate (PRED FORTE) 1 % ophthalmic suspension Place 1 drop into the right eye 4 (four) times daily.     sertraline (ZOLOFT) 50 MG tablet Take 100 mg by mouth daily.     tamsulosin  (FLOMAX ) 0.4 MG CAPS capsule TAKE 1 CAPSULE(0.4 MG) BY MOUTH DAILY AFTER SUPPER 90 capsule 3   telmisartan (MICARDIS) 40 MG tablet Take 40 mg by mouth daily.     torsemide (DEMADEX) 10 MG tablet Take by mouth.     triamcinolone (NASACORT) 55 MCG/ACT AERO nasal inhaler Place 2 sprays into the nose 2 (two) times daily  as needed (allergies).      fexofenadine (ALLEGRA) 180 MG tablet Take 180 mg by mouth daily as needed for allergies. (Patient not taking: Reported on 01/25/2024)     No current facility-administered medications for this visit.   Facility-Administered Medications Ordered in Other Visits  Medication Dose Route Frequency Provider Last Rate Last Admin   epoetin  alfa-epbx (RETACRIT ) injection 40,000 Units  40,000 Units Subcutaneous Once Jeslynn Hollander J, MD        OBJECTIVE: Vitals:   02/22/24 1137  BP: (!) 152/57  Pulse: (!) 55  Resp: 16  Temp: (!) 97 F (36.1 C)  SpO2: 100%       Body mass index is 32.45 kg/m.    ECOG FS:1 - Symptomatic but completely ambulatory  General: Well-developed, well-nourished, no acute distress.  Sitting in a wheelchair. Eyes: Pink conjunctiva, anicteric sclera. HEENT: Normocephalic, moist mucous membranes. Lungs: No audible wheezing or coughing. Heart: Regular rate and rhythm. Abdomen: Soft, nontender, no obvious distention. Musculoskeletal: No edema, cyanosis, or clubbing. Neuro: Alert, answering all questions appropriately. Cranial  nerves grossly intact. Skin: Ecchymosis noted.   Psych: Normal affect.  LAB RESULTS:  Lab Results  Component Value Date   NA 135 02/22/2024   K 4.9 02/22/2024   CL 110 02/22/2024   CO2 18 (L) 02/22/2024   GLUCOSE 138 (H) 02/22/2024   BUN 67 (H) 02/22/2024   CREATININE 3.50 (H) 02/22/2024   CALCIUM  7.5 (L) 02/22/2024   PROT 4.7 (L) 02/22/2024   ALBUMIN 2.8 (L) 02/22/2024   AST 21 02/22/2024   ALT 24 02/22/2024   ALKPHOS 114 02/22/2024   BILITOT 0.7 02/22/2024   GFRNONAA 16 (L) 02/22/2024   GFRAA 37 (L) 11/19/2019    Lab Results  Component Value Date   WBC 16.5 (H) 02/22/2024   NEUTROABS 2.0 02/22/2024   HGB 7.6 (L) 02/22/2024   HCT 24.4 (L) 02/22/2024   MCV 101.7 (H) 02/22/2024   PLT 19 (L) 02/22/2024   Lab Results  Component Value Date   IRON 79 01/11/2024   TIBC 304 01/11/2024   IRONPCTSAT 26 01/11/2024   Lab Results  Component Value Date   FERRITIN 142 01/11/2024     STUDIES: No results found.   ASSESSMENT: CLL, anemia/thrombocytopenia.  PLAN:    CLL: Bone marrow biopsy on January 08, 2024 reported 69% clonal B-cell lymphocytes consistent with a diagnosis of CLL.  Patient does not have any peripheral lymphocytosis.  No other pathology was reported.  It is possible patient's chronic thrombocytopenia, worsening anemia, night sweats are related to his CLL, therefore patient was initiated on dose reduced Imbruvica  280 mg daily in early April 2025.  Continue with treatment as prescribed.  Return to clinic tomorrow for 2 units of packed red blood cells.  Patient then return to clinic in 1 week for further evaluation and continuation of treatment.   Anemia, unspecified: Possibly related to progression of CLL, but we do not have a previous bone marrow biopsy for comparison.  Patient's hemoglobin only increased to 7.6 despite 2 units of packed red blood cells last week.  Return to clinic tomorrow for 2 additional units of packed red blood cells.  All blood products  should be irradiated. Thrombocytopenia: Chronic.  Patient's platelet count is 19.  Monitor.  Bone marrow biopsy as above.  Previously, patient required multiple transfusions of platelets prior to his oral surgery.  He did not have any excessive bleeding during his surgery.  His count was as high as 95  in June 2016.  He does not require transfusion today.   Lymphadenopathy: CT scan result from December 26, 2023 reviewed independently with no obvious lymphadenopathy. Aortic aneurysm: Appreciate vascular surgery input.  Patient elected to hold off any procedures until his aneurysm reaches 5.5 cm. Dental surgery: Successful and resolved. Renal insufficiency: Chronic and unchanged.  Patient's most recent GFR remains stable at 16.  Continue monitoring and treatment per nephrology.  Imbruvica  does not need to be dose reduced in the setting of renal dysfunction.  I spent a total of 30 minutes reviewing chart data, face-to-face evaluation with the patient, counseling and coordination of care as detailed above.   Patient expressed understanding and was in agreement with this plan. He also understands that He can call clinic at any time with any questions, concerns, or complaints.    Cancer Staging  Chronic lymphocytic leukemia (HCC) Staging form: Chronic Lymphocytic Leukemia / Small Lymphocytic Lymphoma, AJCC 8th Edition - Clinical stage from 01/12/2024: Modified Rai Stage IV (Modified Rai risk: High, Lymphocytosis: Absent, Adenopathy: Absent, Organomegaly: Absent, Anemia: Present, Thrombocytopenia: Present) - Signed by Shellie Dials, MD on 01/13/2024    Shellie Dials, MD   02/22/2024 12:01 PM

## 2024-02-23 ENCOUNTER — Inpatient Hospital Stay

## 2024-02-23 ENCOUNTER — Other Ambulatory Visit: Payer: Self-pay | Admitting: *Deleted

## 2024-02-23 DIAGNOSIS — C9110 Chronic lymphocytic leukemia of B-cell type not having achieved remission: Secondary | ICD-10-CM | POA: Diagnosis not present

## 2024-02-23 DIAGNOSIS — D631 Anemia in chronic kidney disease: Secondary | ICD-10-CM

## 2024-02-23 LAB — PREPARE RBC (CROSSMATCH)

## 2024-02-23 MED ORDER — SODIUM CHLORIDE 0.9% IV SOLUTION
250.0000 mL | INTRAVENOUS | Status: DC
  Administered 2024-02-23: 250 mL via INTRAVENOUS
  Filled 2024-02-23: qty 250

## 2024-02-23 MED ORDER — ACETAMINOPHEN 325 MG PO TABS
650.0000 mg | ORAL_TABLET | Freq: Once | ORAL | Status: AC
  Administered 2024-02-23: 650 mg via ORAL
  Filled 2024-02-23: qty 2

## 2024-02-23 MED ORDER — DIPHENHYDRAMINE HCL 50 MG/ML IJ SOLN
25.0000 mg | Freq: Once | INTRAMUSCULAR | Status: AC
  Administered 2024-02-23: 25 mg via INTRAVENOUS
  Filled 2024-02-23: qty 1

## 2024-02-24 LAB — BPAM RBC
Blood Product Expiration Date: 202505292359
Blood Product Expiration Date: 202506102359
ISSUE DATE / TIME: 202505160919
ISSUE DATE / TIME: 202505161106
Unit Type and Rh: 6200
Unit Type and Rh: 6200

## 2024-02-24 LAB — TYPE AND SCREEN
ABO/RH(D): A POS
Antibody Screen: NEGATIVE
Unit division: 0
Unit division: 0

## 2024-02-27 ENCOUNTER — Encounter (INDEPENDENT_AMBULATORY_CARE_PROVIDER_SITE_OTHER): Payer: Self-pay

## 2024-02-29 ENCOUNTER — Encounter: Payer: Self-pay | Admitting: Oncology

## 2024-02-29 ENCOUNTER — Other Ambulatory Visit (HOSPITAL_COMMUNITY): Payer: Self-pay

## 2024-02-29 ENCOUNTER — Other Ambulatory Visit: Payer: Self-pay | Admitting: *Deleted

## 2024-02-29 ENCOUNTER — Other Ambulatory Visit: Payer: Self-pay

## 2024-02-29 ENCOUNTER — Inpatient Hospital Stay (HOSPITAL_BASED_OUTPATIENT_CLINIC_OR_DEPARTMENT_OTHER): Admitting: Oncology

## 2024-02-29 ENCOUNTER — Other Ambulatory Visit: Payer: Self-pay | Admitting: Pharmacist

## 2024-02-29 ENCOUNTER — Inpatient Hospital Stay

## 2024-02-29 VITALS — BP 161/69 | HR 57 | Temp 97.9°F | Resp 16 | Ht 65.0 in | Wt 193.0 lb

## 2024-02-29 DIAGNOSIS — C9110 Chronic lymphocytic leukemia of B-cell type not having achieved remission: Secondary | ICD-10-CM

## 2024-02-29 DIAGNOSIS — D631 Anemia in chronic kidney disease: Secondary | ICD-10-CM

## 2024-02-29 DIAGNOSIS — D696 Thrombocytopenia, unspecified: Secondary | ICD-10-CM

## 2024-02-29 LAB — CMP (CANCER CENTER ONLY)
ALT: 25 U/L (ref 0–44)
AST: 20 U/L (ref 15–41)
Albumin: 2.8 g/dL — ABNORMAL LOW (ref 3.5–5.0)
Alkaline Phosphatase: 127 U/L — ABNORMAL HIGH (ref 38–126)
Anion gap: 6 (ref 5–15)
BUN: 77 mg/dL — ABNORMAL HIGH (ref 8–23)
CO2: 18 mmol/L — ABNORMAL LOW (ref 22–32)
Calcium: 7.7 mg/dL — ABNORMAL LOW (ref 8.9–10.3)
Chloride: 111 mmol/L (ref 98–111)
Creatinine: 3.59 mg/dL — ABNORMAL HIGH (ref 0.61–1.24)
GFR, Estimated: 16 mL/min — ABNORMAL LOW (ref >60)
Glucose, Bld: 103 mg/dL — ABNORMAL HIGH (ref 70–99)
Potassium: 4.9 mmol/L (ref 3.5–5.1)
Sodium: 135 mmol/L (ref 135–145)
Total Bilirubin: 0.9 mg/dL (ref 0.0–1.2)
Total Protein: 4.8 g/dL — ABNORMAL LOW (ref 6.5–8.1)

## 2024-02-29 LAB — CBC WITH DIFFERENTIAL (CANCER CENTER ONLY)
Abs Immature Granulocytes: 0.06 10*3/uL (ref 0.00–0.07)
Basophils Absolute: 0.1 10*3/uL (ref 0.0–0.1)
Basophils Relative: 0 %
Eosinophils Absolute: 0.1 10*3/uL (ref 0.0–0.5)
Eosinophils Relative: 0 %
HCT: 23.3 % — ABNORMAL LOW (ref 39.0–52.0)
Hemoglobin: 7.5 g/dL — ABNORMAL LOW (ref 13.0–17.0)
Immature Granulocytes: 0 %
Lymphocytes Relative: 90 %
Lymphs Abs: 17 10*3/uL — ABNORMAL HIGH (ref 0.7–4.0)
MCH: 32.1 pg (ref 26.0–34.0)
MCHC: 32.2 g/dL (ref 30.0–36.0)
MCV: 99.6 fL (ref 80.0–100.0)
Monocytes Absolute: 0.2 10*3/uL (ref 0.1–1.0)
Monocytes Relative: 1 %
Neutro Abs: 1.7 10*3/uL (ref 1.7–7.7)
Neutrophils Relative %: 9 %
Platelet Count: 20 10*3/uL — ABNORMAL LOW (ref 150–400)
RBC: 2.34 MIL/uL — ABNORMAL LOW (ref 4.22–5.81)
RDW: 18.6 % — ABNORMAL HIGH (ref 11.5–15.5)
Smear Review: NORMAL
WBC Count: 19.1 10*3/uL — ABNORMAL HIGH (ref 4.0–10.5)
nRBC: 0 % (ref 0.0–0.2)

## 2024-02-29 MED ORDER — IBRUTINIB 420 MG PO TABS
420.0000 mg | ORAL_TABLET | Freq: Every day | ORAL | 1 refills | Status: DC
  Filled 2024-02-29: qty 28, 28d supply, fill #0
  Filled 2024-03-21: qty 28, 28d supply, fill #1

## 2024-02-29 NOTE — Progress Notes (Signed)
 Specialty Pharmacy Refill Coordination Note  Patrick Littles. is a 89 y.o. male contacted today regarding refills of specialty medication(s) Ibrutinib  (IMBRUVICA )   Patient requested Delivery   Delivery date: 03/01/24   Verified address: [address] [place] [facility] [place] [address]   Medication will be filled on 05.22.25.

## 2024-02-29 NOTE — Progress Notes (Signed)
 [facility]  [phone] [phone]  ID: Patrick Bradley Althouse Jr. OB: 07-31-1934  MR#: [id number]  MVH#:[id number]  Patient Care Team: Yehuda Helms, MD as PCP - General (Internal Medicine) Shellie Dials, MD as Consulting Physician (Oncology)  CHIEF COMPLAINT: CLL, anemia/thrombocytopenia.  INTERVAL HISTORY: Patient returns to clinic today for repeat laboratory work, further evaluation, and consideration of blood transfusion.  He continues to have chronic weakness and fatigue. He continues to have easy bruising. He is tolerating Imbruvica  without significant side effects.  He has no neurologic complaints today.  He denies any chest pain, shortness of breath, cough, or hemoptysis.  He denies any nausea, vomiting, constipation, or diarrhea. He has no urinary complaints.  Patient offers no further specific complaints today.  REVIEW OF SYSTEMS:   Review of Systems  Constitutional:  Positive for malaise/fatigue. Negative for diaphoresis, fever and weight loss.  Respiratory: Negative.  Negative for cough and shortness of breath.   Cardiovascular: Negative.  Negative for chest pain and leg swelling.  Gastrointestinal: Negative.  Negative for abdominal pain, blood in stool and melena.  Genitourinary: Negative.  Negative for dysuria.  Musculoskeletal: Negative.  Negative for back pain.  Skin: Negative.  Negative for rash.  Neurological:  Positive for weakness. Negative for dizziness, sensory change, focal weakness and headaches.  Endo/Heme/Allergies:  Bruises/bleeds easily.  Psychiatric/Behavioral: Negative.  The patient is not nervous/anxious.     As per HPI. Otherwise, a complete review of systems is negative.  PAST MEDICAL HISTORY: Past Medical History:  Diagnosis Date   Anxiety    CKD (chronic kidney disease), stage III (HCC)    CLL (chronic lymphocytic leukemia) (HCC)    Diabetes mellitus without complication (HCC)    Diverticulitis  04/09/2017   History of kidney stones    HLD (hyperlipidemia)    HTN (hypertension)    PKD (polycystic kidney disease)    Sleep apnea    Uncontrolled type 2 diabetes mellitus with hyperglycemia (HCC) 06/28/2018    PAST SURGICAL HISTORY: Past Surgical History:  Procedure Laterality Date   CARDIAC CATHETERIZATION  12/1987   CENTRAL LINE INSERTION N/A 04/11/2017   Procedure: Central Line Insertion;  Surgeon: Jackquelyn Mass, MD;  Location: [facility];  Service: Cardiovascular;  Laterality: N/A;   CERVICAL FUSION     CHOLECYSTECTOMY     CYSTOSCOPY W/ RETROGRADES Left 05/12/2017   Procedure: CYSTOSCOPY WITH RETROGRADE PYELOGRAM;  Surgeon: Bart Born, MD;  Location: [facility];  Service: Urology;  Laterality: Left;   CYSTOSCOPY W/ URETERAL STENT PLACEMENT Left 05/12/2017   Procedure: CYSTOSCOPY WITH STENT REMOVAL;  Surgeon: Bart Born, MD;  Location: [facility];  Service: Urology;  Laterality: Left;   CYSTOSCOPY WITH STENT PLACEMENT Left 04/11/2017   Procedure: CYSTOSCOPY WITH STENT PLACEMENT;  Surgeon: Christina Coyer, MD;  Location: [facility];  Service: Urology;  Laterality: Left;   URETEROSCOPY  05/12/2017   Procedure: URETEROSCOPY;  Surgeon: Bart Born, MD;  Location: [facility];  Service: Urology;;    FAMILY HISTORY: Reported history of ovarian and lung cancer. Diabetes, hypertension.     ADVANCED DIRECTIVES:    HEALTH MAINTENANCE: Social History   Tobacco Use   Smoking status: Former    Types: Cigarettes    Passive exposure: Past   Smokeless tobacco: Never   Tobacco comments:    quit in Feb of 1997  Vaping Use   Vaping status: Never Used  Substance Use Topics   Alcohol use: No  Drug use: No     Colonoscopy:  PAP:  Bone density:  Lipid panel:  Allergies  Allergen Reactions   Celecoxib Other (See Comments)    Increased Blood Pressure   Chlorpromazine Nausea Only   Iodinated Contrast Media Other (See Comments)    Stage 3 kidney  disease, told IV dye would be bad for him   Prednisone Other (See Comments)    Diabetic   Amoxicillin-Pot Clavulanate Rash    Has patient had a PCN reaction causing immediate rash, facial/tongue/throat swelling, SOB or lightheadedness with hypotension: No Has patient had a PCN reaction causing severe rash involving mucus membranes or skin necrosis: No Has patient had a PCN reaction that required hospitalization: No Has patient had a PCN reaction occurring within the last 10 years: Yes If all of the above answers are "NO", then may proceed with Cephalosporin use.    Penicillin V Potassium Rash    Current Outpatient Medications  Medication Sig Dispense Refill   Aflibercept 2 MG/0.05ML SOLN Inject 1 Dose into the eye as directed. One injection into left eye every 8-9 weeks     atorvastatin  (LIPITOR) 10 MG tablet Take 10 mg by mouth at bedtime.      BD PEN NEEDLE NANO 2ND GEN 32G X 4 MM MISC USE AS DIRECTED ONCE A DAY     carvedilol  (COREG ) 12.5 MG tablet Take 12.5 mg by mouth 2 (two) times daily with a meal.     Cholecalciferol (VITAMIN D -3) 5000 units TABS Take 5,000 Units by mouth at bedtime.      clonazePAM  (KLONOPIN ) 1 MG tablet Take 1 mg by mouth at bedtime.      CONTOUR NEXT TEST test strip daily.     cyanocobalamin 500 MCG tablet Take 1,000 mcg by mouth at bedtime.      diphenoxylate -atropine  (LOMOTIL ) 2.5-0.025 MG tablet Take 1 tablet by mouth 4 (four) times daily as needed for diarrhea or loose stools. 60 tablet 1   empagliflozin (JARDIANCE) 10 MG TABS tablet 10 mg daily.     epoetin  alfa-epbx (RETACRIT ) 40000 UNIT/ML injection Inject 40,000 Units into the skin every 6 (six) weeks. Depending on lab results (if needed)     finasteride  (PROSCAR ) 5 MG tablet Take 1 tablet (5 mg total) by mouth daily. 90 tablet 3   folic acid  (FOLVITE ) 400 MCG tablet Take 400 mcg by mouth daily.     insulin  glargine (LANTUS ) 100 UNIT/ML injection Inject 20-30 Units into the skin every morning. Takes  Lantus  20 units if CBG less than 120 mg/dl or Lantus  30 units if CBG is over 120 mg/dl     Multiple Vitamins-Minerals (PRESERVISION AREDS 2 PO) Take 1 capsule by mouth 2 (two) times daily.      prednisoLONE acetate (PRED FORTE) 1 % ophthalmic suspension Place 1 drop into the right eye 4 (four) times daily.     sertraline (ZOLOFT) 50 MG tablet Take 100 mg by mouth daily.     tamsulosin  (FLOMAX ) 0.4 MG CAPS capsule TAKE 1 CAPSULE(0.4 MG) BY MOUTH DAILY AFTER SUPPER 90 capsule 3   telmisartan (MICARDIS) 40 MG tablet Take 40 mg by mouth daily.     torsemide (DEMADEX) 10 MG tablet Take by mouth.     triamcinolone (NASACORT) 55 MCG/ACT AERO nasal inhaler Place 2 sprays into the nose 2 (two) times daily as needed (allergies).      fexofenadine (ALLEGRA) 180 MG tablet Take 180 mg by mouth daily as needed for allergies. (Patient not  taking: Reported on 01/25/2024)     ibrutinib  (IMBRUVICA ) 420 MG tablet Take 1 tablet (420 mg total) by mouth daily. Take with a glass of water. 28 tablet 1   No current facility-administered medications for this visit.   Facility-Administered Medications Ordered in Other Visits  Medication Dose Route Frequency Provider Last Rate Last Admin   epoetin  alfa-epbx (RETACRIT ) injection 40,000 Units  40,000 Units Subcutaneous Once Shellie Dials, MD        OBJECTIVE: Vitals:   02/29/24 0839  BP: (!) 161/69  Pulse: (!) 57  Resp: 16  Temp: 97.9 F (36.6 C)  SpO2: 100%       Body mass index is 32.12 kg/m.    ECOG FS:1 - Symptomatic but completely ambulatory  General: Well-developed, well-nourished, no acute distress. Eyes: Pink conjunctiva, anicteric sclera. HEENT: Normocephalic, moist mucous membranes. Lungs: No audible wheezing or coughing. Heart: Regular rate and rhythm. Abdomen: Soft, nontender, no obvious distention. Musculoskeletal: No edema, cyanosis, or clubbing. Neuro: Alert, answering all questions appropriately. Cranial nerves grossly intact. Skin: No  rashes or petechiae noted. Psych: Normal affect.  LAB RESULTS:  Lab Results  Component Value Date   NA 135 02/29/2024   K 4.9 02/29/2024   CL 111 02/29/2024   CO2 18 (L) 02/29/2024   GLUCOSE 103 (H) 02/29/2024   BUN 77 (H) 02/29/2024   CREATININE 3.59 (H) 02/29/2024   CALCIUM  7.7 (L) 02/29/2024   PROT 4.8 (L) 02/29/2024   ALBUMIN 2.8 (L) 02/29/2024   AST 20 02/29/2024   ALT 25 02/29/2024   ALKPHOS 127 (H) 02/29/2024   BILITOT 0.9 02/29/2024   GFRNONAA 16 (L) 02/29/2024   GFRAA 37 (L) 11/19/2019    Lab Results  Component Value Date   WBC 19.1 (H) 02/29/2024   NEUTROABS PENDING 02/29/2024   HGB 7.5 (L) 02/29/2024   HCT 23.3 (L) 02/29/2024   MCV 99.6 02/29/2024   PLT 20 (L) 02/29/2024   Lab Results  Component Value Date   IRON 79 01/11/2024   TIBC 304 01/11/2024   IRONPCTSAT 26 01/11/2024   Lab Results  Component Value Date   FERRITIN 142 01/11/2024     STUDIES: No results found.   ASSESSMENT: CLL, anemia/thrombocytopenia.  PLAN:    CLL: Bone marrow biopsy on January 08, 2024 reported 69% clonal B-cell lymphocytes consistent with a diagnosis of CLL.  Patient does not have any peripheral lymphocytosis.  No other pathology was reported.  It is possible patient's chronic thrombocytopenia, worsening anemia, night sweats are related to his CLL, therefore patient was initiated on dose reduced Imbruvica  280 mg daily in early April 2025.  Patient has been instructed to increase his dose of Imbruvica  to 420 mg daily.  Return to clinic tomorrow for 1 unit of packed red blood cells.  And then in 1 week for further evaluation and continuation of treatment.  Appreciate clinical pharmacy input.   Anemia, unspecified: Possibly related to progression of CLL, but we do not have a previous bone marrow biopsy for comparison.  Patient's hemoglobin is unchanged despite 2 units of packed red blood cells last week.  Will proceed with only 1 unit tomorrow to assess whether this is adequate.   All blood products should be irradiated. Thrombocytopenia: Chronic and unchanged.  Patient's platelet count is 20.  Monitor.  Bone marrow biopsy as above.  Previously, patient required multiple transfusions of platelets prior to his oral surgery.  He did not have any excessive bleeding during his surgery.  His  count was as high as 95 in June 2016.  He does not require transfusion today.   Lymphadenopathy: CT scan result from December 26, 2023 reviewed independently with no obvious lymphadenopathy. Aortic aneurysm: Appreciate vascular surgery input.  Patient elected to hold off any procedures until his aneurysm reaches 5.5 cm. Dental surgery: Successful and resolved. Renal insufficiency: Chronic and unchanged.  Patient's most recent GFR remains stable at 16.  Continue monitoring and treatment per nephrology.  Imbruvica  does not need to be dose reduced in the setting of renal dysfunction.  I spent a total of 30 minutes reviewing chart data, face-to-face evaluation with the patient, counseling and coordination of care as detailed above.   Patient expressed understanding and was in agreement with this plan. He also understands that He can call clinic at any time with any questions, concerns, or complaints.    Cancer Staging  Chronic lymphocytic leukemia (HCC) Staging form: Chronic Lymphocytic Leukemia / Small Lymphocytic Lymphoma, AJCC 8th Edition - Clinical stage from 01/12/2024: Modified Rai Stage IV (Modified Rai risk: High, Lymphocytosis: Absent, Adenopathy: Absent, Organomegaly: Absent, Anemia: Present, Thrombocytopenia: Present) - Signed by Shellie Dials, MD on 01/13/2024    Shellie Dials, MD   02/29/2024 9:11 AM

## 2024-02-29 NOTE — Progress Notes (Signed)
 Per MD, patient to dose increase ibrutinib  to 420mg , new rx sent to [facility] (Specialty)

## 2024-03-01 ENCOUNTER — Inpatient Hospital Stay

## 2024-03-01 DIAGNOSIS — C9110 Chronic lymphocytic leukemia of B-cell type not having achieved remission: Secondary | ICD-10-CM | POA: Diagnosis not present

## 2024-03-01 LAB — PREPARE RBC (CROSSMATCH)

## 2024-03-01 MED ORDER — ACETAMINOPHEN 325 MG PO TABS
650.0000 mg | ORAL_TABLET | Freq: Once | ORAL | Status: AC
  Administered 2024-03-01: 650 mg via ORAL
  Filled 2024-03-01: qty 2

## 2024-03-01 MED ORDER — SODIUM CHLORIDE 0.9% IV SOLUTION
250.0000 mL | INTRAVENOUS | Status: DC
Start: 2024-03-01 — End: 2024-03-01
  Administered 2024-03-01: 100 mL via INTRAVENOUS
  Filled 2024-03-01: qty 250

## 2024-03-01 MED ORDER — DIPHENHYDRAMINE HCL 50 MG/ML IJ SOLN
25.0000 mg | Freq: Once | INTRAMUSCULAR | Status: AC
  Administered 2024-03-01: 25 mg via INTRAVENOUS
  Filled 2024-03-01: qty 1

## 2024-03-01 NOTE — Patient Instructions (Signed)

## 2024-03-02 LAB — TYPE AND SCREEN
ABO/RH(D): A POS
Antibody Screen: NEGATIVE
Unit division: 0

## 2024-03-02 LAB — BPAM RBC
Blood Product Expiration Date: 202506132359
ISSUE DATE / TIME: 202505230941
Unit Type and Rh: 6200

## 2024-03-05 ENCOUNTER — Ambulatory Visit
Admission: RE | Admit: 2024-03-05 | Discharge: 2024-03-05 | Disposition: A | Discharge status: Home / Self Care | Source: Ambulatory Visit | Attending: Vascular Surgery | Admitting: Vascular Surgery

## 2024-03-05 DIAGNOSIS — I7143 Infrarenal abdominal aortic aneurysm, without rupture: Secondary | ICD-10-CM | POA: Diagnosis present

## 2024-03-06 ENCOUNTER — Other Ambulatory Visit: Payer: Self-pay | Admitting: *Deleted

## 2024-03-06 DIAGNOSIS — D696 Thrombocytopenia, unspecified: Secondary | ICD-10-CM

## 2024-03-06 DIAGNOSIS — D631 Anemia in chronic kidney disease: Secondary | ICD-10-CM

## 2024-03-06 DIAGNOSIS — C9110 Chronic lymphocytic leukemia of B-cell type not having achieved remission: Secondary | ICD-10-CM

## 2024-03-07 ENCOUNTER — Inpatient Hospital Stay (HOSPITAL_BASED_OUTPATIENT_CLINIC_OR_DEPARTMENT_OTHER): Admitting: Oncology

## 2024-03-07 ENCOUNTER — Encounter: Payer: Self-pay | Admitting: Oncology

## 2024-03-07 ENCOUNTER — Inpatient Hospital Stay: Admitting: Pharmacist

## 2024-03-07 ENCOUNTER — Other Ambulatory Visit: Payer: Self-pay | Admitting: *Deleted

## 2024-03-07 ENCOUNTER — Inpatient Hospital Stay

## 2024-03-07 VITALS — BP 135/60 | HR 57 | Temp 97.5°F | Resp 15 | Wt 192.3 lb

## 2024-03-07 DIAGNOSIS — C9110 Chronic lymphocytic leukemia of B-cell type not having achieved remission: Secondary | ICD-10-CM | POA: Diagnosis not present

## 2024-03-07 DIAGNOSIS — D696 Thrombocytopenia, unspecified: Secondary | ICD-10-CM

## 2024-03-07 DIAGNOSIS — N189 Chronic kidney disease, unspecified: Secondary | ICD-10-CM

## 2024-03-07 LAB — CBC WITH DIFFERENTIAL (CANCER CENTER ONLY)
Abs Immature Granulocytes: 0.07 10*3/uL (ref 0.00–0.07)
Basophils Absolute: 0 10*3/uL (ref 0.0–0.1)
Basophils Relative: 0 %
Eosinophils Absolute: 0.1 10*3/uL (ref 0.0–0.5)
Eosinophils Relative: 0 %
HCT: 21.9 % — ABNORMAL LOW (ref 39.0–52.0)
Hemoglobin: 7 g/dL — ABNORMAL LOW (ref 13.0–17.0)
Immature Granulocytes: 0 %
Lymphocytes Relative: 88 %
Lymphs Abs: 15.2 10*3/uL — ABNORMAL HIGH (ref 0.7–4.0)
MCH: 32.1 pg (ref 26.0–34.0)
MCHC: 32 g/dL (ref 30.0–36.0)
MCV: 100.5 fL — ABNORMAL HIGH (ref 80.0–100.0)
Monocytes Absolute: 0.2 10*3/uL (ref 0.1–1.0)
Monocytes Relative: 1 %
Neutro Abs: 2 10*3/uL (ref 1.7–7.7)
Neutrophils Relative %: 11 %
Platelet Count: 23 10*3/uL — ABNORMAL LOW (ref 150–400)
RBC: 2.18 MIL/uL — ABNORMAL LOW (ref 4.22–5.81)
RDW: 18.1 % — ABNORMAL HIGH (ref 11.5–15.5)
WBC Count: 17.5 10*3/uL — ABNORMAL HIGH (ref 4.0–10.5)
nRBC: 0 % (ref 0.0–0.2)

## 2024-03-07 LAB — CMP (CANCER CENTER ONLY)
ALT: 23 U/L (ref 0–44)
AST: 19 U/L (ref 15–41)
Albumin: 2.7 g/dL — ABNORMAL LOW (ref 3.5–5.0)
Alkaline Phosphatase: 121 U/L (ref 38–126)
Anion gap: 6 (ref 5–15)
BUN: 73 mg/dL — ABNORMAL HIGH (ref 8–23)
CO2: 20 mmol/L — ABNORMAL LOW (ref 22–32)
Calcium: 7.3 mg/dL — ABNORMAL LOW (ref 8.9–10.3)
Chloride: 109 mmol/L (ref 98–111)
Creatinine: 3.58 mg/dL — ABNORMAL HIGH (ref 0.61–1.24)
GFR, Estimated: 16 mL/min — ABNORMAL LOW (ref >60)
Glucose, Bld: 124 mg/dL — ABNORMAL HIGH (ref 70–99)
Potassium: 4.9 mmol/L (ref 3.5–5.1)
Sodium: 135 mmol/L (ref 135–145)
Total Bilirubin: 0.7 mg/dL (ref 0.0–1.2)
Total Protein: 4.7 g/dL — ABNORMAL LOW (ref 6.5–8.1)

## 2024-03-07 LAB — SAMPLE TO BLOOD BANK

## 2024-03-07 LAB — PREPARE RBC (CROSSMATCH)

## 2024-03-07 NOTE — Progress Notes (Signed)
 [facility]  [phone] [phone]  ID: Patrick Bradley Convey Jr. OB: 27-Feb-1934  MR#: [id number]  ION#:[id number]  Patient Care Team: Yehuda Helms, MD as PCP - General (Internal Medicine) Shellie Dials, MD as Consulting Physician (Oncology)  CHIEF COMPLAINT: CLL, anemia/thrombocytopenia.  INTERVAL HISTORY: Patient returns to clinic today for repeat laboratory work, further evaluation, consideration of a blood transfusion.  He is tolerating his increased dose of Imbruvica  without significant side effects.  He continues to have chronic weakness and fatigue.  Continues to have easy bruising.  He has increased edema of his left hand.  He has no neurologic complaints today.  He denies any chest pain, shortness of breath, cough, or hemoptysis.  He denies any nausea, vomiting, constipation, or diarrhea. He has no urinary complaints.  Patient offers no further specific complaints today.  REVIEW OF SYSTEMS:   Review of Systems  Constitutional:  Positive for malaise/fatigue. Negative for diaphoresis, fever and weight loss.  Respiratory: Negative.  Negative for cough and shortness of breath.   Cardiovascular: Negative.  Negative for chest pain and leg swelling.  Gastrointestinal: Negative.  Negative for abdominal pain, blood in stool and melena.  Genitourinary: Negative.  Negative for dysuria.  Musculoskeletal: Negative.  Negative for back pain.  Skin: Negative.  Negative for rash.  Neurological:  Positive for weakness. Negative for dizziness, sensory change, focal weakness and headaches.  Endo/Heme/Allergies:  Bruises/bleeds easily.  Psychiatric/Behavioral: Negative.  The patient is not nervous/anxious.     As per HPI. Otherwise, a complete review of systems is negative.  PAST MEDICAL HISTORY: Past Medical History:  Diagnosis Date   Anxiety    CKD (chronic kidney disease), stage III (HCC)    CLL (chronic lymphocytic leukemia) (HCC)     Diabetes mellitus without complication (HCC)    Diverticulitis 04/09/2017   History of kidney stones    HLD (hyperlipidemia)    HTN (hypertension)    PKD (polycystic kidney disease)    Sleep apnea    Uncontrolled type 2 diabetes mellitus with hyperglycemia (HCC) 06/28/2018    PAST SURGICAL HISTORY: Past Surgical History:  Procedure Laterality Date   CARDIAC CATHETERIZATION  12/1987   CENTRAL LINE INSERTION N/A 04/11/2017   Procedure: Central Line Insertion;  Surgeon: Jackquelyn Mass, MD;  Location: [facility];  Service: Cardiovascular;  Laterality: N/A;   CERVICAL FUSION     CHOLECYSTECTOMY     CYSTOSCOPY W/ RETROGRADES Left 05/12/2017   Procedure: CYSTOSCOPY WITH RETROGRADE PYELOGRAM;  Surgeon: Bart Born, MD;  Location: [facility] ORS;  Service: Urology;  Laterality: Left;   CYSTOSCOPY W/ URETERAL STENT PLACEMENT Left 05/12/2017   Procedure: CYSTOSCOPY WITH STENT REMOVAL;  Surgeon: Bart Born, MD;  Location: [facility] ORS;  Service: Urology;  Laterality: Left;   CYSTOSCOPY WITH STENT PLACEMENT Left 04/11/2017   Procedure: CYSTOSCOPY WITH STENT PLACEMENT;  Surgeon: Christina Coyer, MD;  Location: [facility] ORS;  Service: Urology;  Laterality: Left;   URETEROSCOPY  05/12/2017   Procedure: URETEROSCOPY;  Surgeon: Bart Born, MD;  Location: [facility] ORS;  Service: Urology;;    FAMILY HISTORY: Reported history of ovarian and lung cancer. Diabetes, hypertension.     ADVANCED DIRECTIVES:    HEALTH MAINTENANCE: Social History   Tobacco Use   Smoking status: Former    Types: Cigarettes    Passive exposure: Past   Smokeless tobacco: Never   Tobacco comments:    quit in Feb of 1997  Vaping Use   Vaping  status: Never Used  Substance Use Topics   Alcohol use: No   Drug use: No     Colonoscopy:  PAP:  Bone density:  Lipid panel:  Allergies  Allergen Reactions   Celecoxib Other (See Comments)    Increased Blood Pressure   Chlorpromazine Nausea Only    Iodinated Contrast Media Other (See Comments)    Stage 3 kidney disease, told IV dye would be bad for him   Prednisone Other (See Comments)    Diabetic   Amoxicillin-Pot Clavulanate Rash    Has patient had a PCN reaction causing immediate rash, facial/tongue/throat swelling, SOB or lightheadedness with hypotension: No Has patient had a PCN reaction causing severe rash involving mucus membranes or skin necrosis: No Has patient had a PCN reaction that required hospitalization: No Has patient had a PCN reaction occurring within the last 10 years: Yes If all of the above answers are "NO", then may proceed with Cephalosporin use.    Penicillin V Potassium Rash    Current Outpatient Medications  Medication Sig Dispense Refill   Aflibercept 2 MG/0.05ML SOLN Inject 1 Dose into the eye as directed. One injection into left eye every 8-9 weeks     atorvastatin  (LIPITOR) 10 MG tablet Take 10 mg by mouth at bedtime.      BD PEN NEEDLE NANO 2ND GEN 32G X 4 MM MISC USE AS DIRECTED ONCE A DAY     carvedilol  (COREG ) 12.5 MG tablet Take 12.5 mg by mouth 2 (two) times daily with a meal.     Cholecalciferol (VITAMIN D -3) 5000 units TABS Take 5,000 Units by mouth at bedtime.      clonazePAM  (KLONOPIN ) 1 MG tablet Take 1 mg by mouth at bedtime.      CONTOUR NEXT TEST test strip daily.     cyanocobalamin 500 MCG tablet Take 1,000 mcg by mouth at bedtime.      diphenoxylate -atropine  (LOMOTIL ) 2.5-0.025 MG tablet Take 1 tablet by mouth 4 (four) times daily as needed for diarrhea or loose stools. 60 tablet 1   empagliflozin (JARDIANCE) 10 MG TABS tablet 10 mg daily.     epoetin  alfa-epbx (RETACRIT ) 40000 UNIT/ML injection Inject 40,000 Units into the skin every 6 (six) weeks. Depending on lab results (if needed)     finasteride  (PROSCAR ) 5 MG tablet Take 1 tablet (5 mg total) by mouth daily. 90 tablet 3   folic acid  (FOLVITE ) 400 MCG tablet Take 400 mcg by mouth daily.     ibrutinib  (IMBRUVICA ) 420 MG tablet Take  1 tablet (420 mg total) by mouth daily. Take with a glass of water. 28 tablet 1   insulin  glargine (LANTUS ) 100 UNIT/ML injection Inject 20-30 Units into the skin every morning. Takes Lantus  20 units if CBG less than 120 mg/dl or Lantus  30 units if CBG is over 120 mg/dl     Multiple Vitamins-Minerals (PRESERVISION AREDS 2 PO) Take 1 capsule by mouth 2 (two) times daily.      prednisoLONE acetate (PRED FORTE) 1 % ophthalmic suspension Place 1 drop into the right eye 4 (four) times daily.     sertraline (ZOLOFT) 50 MG tablet Take 100 mg by mouth daily.     tamsulosin  (FLOMAX ) 0.4 MG CAPS capsule TAKE 1 CAPSULE(0.4 MG) BY MOUTH DAILY AFTER SUPPER 90 capsule 3   telmisartan (MICARDIS) 40 MG tablet Take 40 mg by mouth daily.     torsemide (DEMADEX) 10 MG tablet Take by mouth.     triamcinolone (NASACORT) 55  MCG/ACT AERO nasal inhaler Place 2 sprays into the nose 2 (two) times daily as needed (allergies).      fexofenadine (ALLEGRA) 180 MG tablet Take 180 mg by mouth daily as needed for allergies. (Patient not taking: Reported on 01/25/2024)     No current facility-administered medications for this visit.   Facility-Administered Medications Ordered in Other Visits  Medication Dose Route Frequency Provider Last Rate Last Admin   epoetin  alfa-epbx (RETACRIT ) injection 40,000 Units  40,000 Units Subcutaneous Once Shellie Dials, MD        OBJECTIVE: Vitals:   03/07/24 0920  BP: 135/60  Pulse: (!) 57  Resp: 15  Temp: (!) 97.5 F (36.4 C)  SpO2: 99%        Body mass index is 32 kg/m.    ECOG FS:1 - Symptomatic but completely ambulatory  General: Well-developed, well-nourished, no acute distress.  Sitting in a wheelchair. Eyes: Pink conjunctiva, anicteric sclera. HEENT: Normocephalic, moist mucous membranes. Lungs: No audible wheezing or coughing. Heart: Regular rate and rhythm. Abdomen: Soft, nontender, no obvious distention. Musculoskeletal: Left hand edema. Neuro: Alert, answering  all questions appropriately. Cranial nerves grossly intact. Skin: No rashes or petechiae noted. Psych: Normal affect.  LAB RESULTS:  Lab Results  Component Value Date   NA 135 03/07/2024   K 4.9 03/07/2024   CL 109 03/07/2024   CO2 20 (L) 03/07/2024   GLUCOSE 124 (H) 03/07/2024   BUN 73 (H) 03/07/2024   CREATININE 3.58 (H) 03/07/2024   CALCIUM  7.3 (L) 03/07/2024   PROT 4.7 (L) 03/07/2024   ALBUMIN 2.7 (L) 03/07/2024   AST 19 03/07/2024   ALT 23 03/07/2024   ALKPHOS 121 03/07/2024   BILITOT 0.7 03/07/2024   GFRNONAA 16 (L) 03/07/2024   GFRAA 37 (L) 11/19/2019    Lab Results  Component Value Date   WBC 17.5 (H) 03/07/2024   NEUTROABS 2.0 03/07/2024   HGB 7.0 (L) 03/07/2024   HCT 21.9 (L) 03/07/2024   MCV 100.5 (H) 03/07/2024   PLT 23 (L) 03/07/2024   Lab Results  Component Value Date   IRON 79 01/11/2024   TIBC 304 01/11/2024   IRONPCTSAT 26 01/11/2024   Lab Results  Component Value Date   FERRITIN 142 01/11/2024     STUDIES: CT ABDOMEN PELVIS WO CONTRAST Result Date: 03/05/2024 EXAMINATION: CT ABDOMEN PELVIS WO CONTRAST CLINICAL INDICATION: Male, 89 years old. Abdominal aortic aneurysm (AAA), follow up TECHNIQUE: Helical CT scan examination of the abdomen and pelvis is performed from the domes of the diaphragm to the pubic symphysis. Limited evaluation of the solid organs due to lack of intravenous contrast. Unless otherwise specified, incidental thyroid, adrenal, renal lesions do not require dedicated imaging follow up. Additionally, any mentioned pulmonary nodules do not require dedicated imaging follow-up based on the Fleischner guidelines unless otherwise specified. Coronary calcifications are not identified unless otherwise specified. COMPARISON: 12/06/2023 FINDINGS: There are minimal atelectatic changes within the lung bases. The heart is at the upper limit of normal for size. There are coronary calcifications. Questionable micronodular hepatic contour. The  gallbladder is surgically absent. The spleen remains mildly enlarged. The pancreas is normal. The adrenals are normal. Polycystic right kidney noted. The left kidney contains a few cysts. There is a similar 5.4 cm infrarenal abdominal aortic aneurysm. Scattered calcified atherosclerotic changes are present. The urinary bladder is normal. The prostate is normal. Small fat-containing right inguinal hernia noted. There is colonic diverticulosis. The appendix is normal. Large and small bowel loops are  otherwise within normal limits. Small fat-containing supraumbilical hernia noted. There is no adenopathy. Trace ascites. There is diffuse osseous demineralization with degenerative changes of the spine and bony pelvis. IMPRESSION: Stable 5.4 cm infrarenal abdominal aortic aneurysm. Recommend follow-up CT in 6 months and referral to a vascular specialist. Stable nonspecific splenomegaly. Trace ascites is slightly improved compared to prior. DOSE REDUCTION: This exam was performed according to our departmental dose-optimization program which includes automated exposure control, adjustment of the mA and/or kV according to patient size and/or use of iterative reconstruction technique. Electronically signed by: Italy Engel MD 03/05/2024 06:21 PM EDT RP Workstation: [id number]     ASSESSMENT: CLL, anemia/thrombocytopenia.  PLAN:    CLL: Bone marrow biopsy on January 08, 2024 reported 69% clonal B-cell lymphocytes consistent with a diagnosis of CLL.  Patient does not have any peripheral lymphocytosis.  No other pathology was reported.  It is possible patient's chronic thrombocytopenia, worsening anemia, night sweats are related to his CLL, therefore patient was initiated on dose reduced Imbruvica  280 mg daily in early April 2025.  He increased to 420 mg Imbruvica  last week and is tolerating treatment well.  Return to clinic tomorrow for 2 units of packed red blood cells.  And then in 1 week for further evaluation and  continuation of treatment.  Appreciate clinical pharmacy input.    Anemia, unspecified: Possibly related to progression of CLL, but we do not have a previous bone marrow biopsy for comparison.  Patient's hemoglobin decreased to 7.0 despite 1 unit of packed red blood cells last week.  Proceed with 2 units tomorrow.  All blood products should be irradiated. Thrombocytopenia: Chronic and unchanged.  Patient's platelet count is 23.  Monitor.  Bone marrow biopsy as above.  Previously, patient required multiple transfusions of platelets prior to his oral surgery.  He did not have any excessive bleeding during his surgery.  His count was as high as 95 in June 2016.  He does not require transfusion today.   Lymphadenopathy: Patient's most recent CT scan to evaluate his aortic aneurysm on Mar 05, 2024 did not reveal any obvious lymphadenopathy.   Aortic aneurysm: Appreciate vascular surgery input.  Patient elected to hold off any procedures until his aneurysm reaches 5.5 cm.  Recent CT scan reported as stable. Dental surgery: Successful and resolved. Renal insufficiency: Chronic and unchanged.  Patient's most recent GFR is 16.  Continue monitoring and treatment per nephrology.  Imbruvica  does not need to be dose reduced in the setting of renal dysfunction.   Patient expressed understanding and was in agreement with this plan. He also understands that He can call clinic at any time with any questions, concerns, or complaints.    Cancer Staging  Chronic lymphocytic leukemia (HCC) Staging form: Chronic Lymphocytic Leukemia / Small Lymphocytic Lymphoma, AJCC 8th Edition - Clinical stage from 01/12/2024: Modified Rai Stage IV (Modified Rai risk: High, Lymphocytosis: Absent, Adenopathy: Absent, Organomegaly: Absent, Anemia: Present, Thrombocytopenia: Present) - Signed by Shellie Dials, MD on 01/13/2024    Shellie Dials, MD   03/07/2024 1:16 PM

## 2024-03-07 NOTE — Progress Notes (Signed)
 Clinical Pharmacist Practitioner Clinic [facility]  [phone] Fax:([phone]  Patient Care Team: Yehuda Helms, MD as PCP - General (Internal Medicine) Shellie Dials, MD as Consulting Physician (Oncology)   Name of the patient: Patrick Payne  [id number]  [birth year]   Date of visit: 03/07/24  HPI: Patient is a 89 y.o. male with with CLL. Patient started ibrutinib  280mg  on 01/18/24. Patient was dose increased to ibrutinib  420mg  on 03/01/24.   Reason for Consult: Oral chemotherapy follow-up for ibrutinib  therapy.   PAST MEDICAL HISTORY: Past Medical History:  Diagnosis Date   Anxiety    CKD (chronic kidney disease), stage III (HCC)    CLL (chronic lymphocytic leukemia) (HCC)    Diabetes mellitus without complication (HCC)    Diverticulitis 04/09/2017   History of kidney stones    HLD (hyperlipidemia)    HTN (hypertension)    PKD (polycystic kidney disease)    Sleep apnea    Uncontrolled type 2 diabetes mellitus with hyperglycemia (HCC) 06/28/2018    HEMATOLOGY/ONCOLOGY HISTORY:  Oncology History  Chronic lymphocytic leukemia (HCC)  04/17/2014 Initial Diagnosis   Chronic lymphocytic leukemia (HCC)   01/12/2024 Cancer Staging   Staging form: Chronic Lymphocytic Leukemia / Small Lymphocytic Lymphoma, AJCC 8th Edition - Clinical stage from 01/12/2024: Modified Rai Stage IV (Modified Rai risk: High, Lymphocytosis: Absent, Adenopathy: Absent, Organomegaly: Absent, Anemia: Present, Thrombocytopenia: Present) - Signed by Shellie Dials, MD on 01/13/2024     ALLERGIES:  is allergic to celecoxib, chlorpromazine, iodinated contrast media, prednisone, amoxicillin-pot clavulanate, and penicillin v potassium.  MEDICATIONS:  Current Outpatient Medications  Medication Sig Dispense Refill   Aflibercept 2 MG/0.05ML SOLN Inject 1 Dose into the eye as directed. One injection into left eye every 8-9 weeks     atorvastatin  (LIPITOR) 10 MG tablet  Take 10 mg by mouth at bedtime.      BD PEN NEEDLE NANO 2ND GEN 32G X 4 MM MISC USE AS DIRECTED ONCE A DAY     carvedilol  (COREG ) 12.5 MG tablet Take 12.5 mg by mouth 2 (two) times daily with a meal.     Cholecalciferol (VITAMIN D -3) 5000 units TABS Take 5,000 Units by mouth at bedtime.      clonazePAM  (KLONOPIN ) 1 MG tablet Take 1 mg by mouth at bedtime.      CONTOUR NEXT TEST test strip daily.     cyanocobalamin 500 MCG tablet Take 1,000 mcg by mouth at bedtime.      diphenoxylate -atropine  (LOMOTIL ) 2.5-0.025 MG tablet Take 1 tablet by mouth 4 (four) times daily as needed for diarrhea or loose stools. 60 tablet 1   empagliflozin (JARDIANCE) 10 MG TABS tablet 10 mg daily.     epoetin  alfa-epbx (RETACRIT ) 40000 UNIT/ML injection Inject 40,000 Units into the skin every 6 (six) weeks. Depending on lab results (if needed)     fexofenadine (ALLEGRA) 180 MG tablet Take 180 mg by mouth daily as needed for allergies. (Patient not taking: Reported on 01/25/2024)     finasteride  (PROSCAR ) 5 MG tablet Take 1 tablet (5 mg total) by mouth daily. 90 tablet 3   folic acid  (FOLVITE ) 400 MCG tablet Take 400 mcg by mouth daily.     ibrutinib  (IMBRUVICA ) 420 MG tablet Take 1 tablet (420 mg total) by mouth daily. Take with a glass of water. 28 tablet 1   insulin  glargine (LANTUS ) 100 UNIT/ML injection Inject 20-30 Units into the skin every morning. Takes Lantus  20 units if CBG less than  120 mg/dl or Lantus  30 units if CBG is over 120 mg/dl     Multiple Vitamins-Minerals (PRESERVISION AREDS 2 PO) Take 1 capsule by mouth 2 (two) times daily.      prednisoLONE acetate (PRED FORTE) 1 % ophthalmic suspension Place 1 drop into the right eye 4 (four) times daily.     sertraline (ZOLOFT) 50 MG tablet Take 100 mg by mouth daily.     tamsulosin  (FLOMAX ) 0.4 MG CAPS capsule TAKE 1 CAPSULE(0.4 MG) BY MOUTH DAILY AFTER SUPPER 90 capsule 3   telmisartan (MICARDIS) 40 MG tablet Take 40 mg by mouth daily.     torsemide (DEMADEX) 10  MG tablet Take by mouth.     triamcinolone (NASACORT) 55 MCG/ACT AERO nasal inhaler Place 2 sprays into the nose 2 (two) times daily as needed (allergies).      No current facility-administered medications for this visit.   Facility-Administered Medications Ordered in Other Visits  Medication Dose Route Frequency Provider Last Rate Last Admin   epoetin  alfa-epbx (RETACRIT ) injection 40,000 Units  40,000 Units Subcutaneous Once Finnegan, Timothy J, MD        VITAL SIGNS: There were no vitals taken for this visit. There were no vitals filed for this visit.  Estimated body mass index is 32 kg/m as calculated from the following:   Height as of 02/29/24: 5\' 5"  (1.651 m).   Weight as of an earlier encounter on 03/07/24: 87.2 kg (192 lb 4.8 oz).  LABS: CBC:    Component Value Date/Time   WBC 17.5 (H) 03/07/2024 0905   WBC 16.5 (H) 02/22/2024 1107   HGB 7.0 (L) 03/07/2024 0905   HGB 12.1 (L) 02/21/2014 1340   HCT 21.9 (L) 03/07/2024 0905   HCT 36.2 (L) 02/21/2014 1340   PLT 23 (L) 03/07/2024 0905   PLT 76 (L) 02/21/2014 1340   MCV 100.5 (H) 03/07/2024 0905   MCV 98 02/21/2014 1340   NEUTROABS 2.0 03/07/2024 0905   NEUTROABS 2.7 02/21/2014 1340   LYMPHSABS 15.2 (H) 03/07/2024 0905   LYMPHSABS 16.9 (H) 02/21/2014 1340   MONOABS 0.2 03/07/2024 0905   MONOABS 0.4 02/21/2014 1340   EOSABS 0.1 03/07/2024 0905   EOSABS 0.3 02/21/2014 1340   BASOSABS 0.0 03/07/2024 0905   BASOSABS 0.1 02/21/2014 1340   Comprehensive Metabolic Panel:    Component Value Date/Time   NA 135 03/07/2024 0905   NA 138 06/09/2017 1110   K 4.9 03/07/2024 0905   CL 109 03/07/2024 0905   CO2 20 (L) 03/07/2024 0905   BUN 73 (H) 03/07/2024 0905   BUN 26 06/09/2017 1110   CREATININE 3.58 (H) 03/07/2024 0905   CREATININE 1.57 (H) 02/21/2014 1340   GLUCOSE 124 (H) 03/07/2024 0905   CALCIUM  7.3 (L) 03/07/2024 0905   AST 19 03/07/2024 0905   ALT 23 03/07/2024 0905   ALKPHOS 121 03/07/2024 0905   BILITOT 0.7  03/07/2024 0905   PROT 4.7 (L) 03/07/2024 0905   ALBUMIN 2.7 (L) 03/07/2024 0905     Present during today's visit: patient and his wife  Assessment and Plan: CBC/CMP reviewed, continue ibrutinib  420mg  daily  Hgb continues to be decreased, patient will receive blood tomorrow  Continue to monitor hgb and pltc   Oral Chemotherapy Side Effect/Intolerance:  Edema: patient has pre-existing edema, current edema present in both legs and his left hand. Per patient and his wife this has not worsened since dose increasing ibrutinib  Fatigue: unchanged No reported diarrhea or nausea  Oral Chemotherapy  Adherence: No missed doses reported No patient barriers to medication adherence identified.    New medications: None reported  Medication Access Issues: No issues  Patient expressed understanding and was in agreement with this plan. He also understands that He can call clinic at any time with any questions, concerns, or complaints.   Follow-up plan: RTC as scheduled  Thank you for allowing me to participate in the care of this very pleasant patient.   Time Total: 15 mins  Visit consisted of counseling and education on dealing with issues of symptom management in the setting of serious and potentially life-threatening illness.Greater than 50%  of this time was spent counseling and coordinating care related to the above assessment and plan.  Signed by: Travelle Mcclimans N. Kristiann Noyce, PharmD, Lorraine Roses, CPP Hematology/Oncology Clinical Pharmacist Practitioner [facility] [phone]  03/07/2024 2:43 PM

## 2024-03-08 ENCOUNTER — Inpatient Hospital Stay

## 2024-03-08 DIAGNOSIS — C9110 Chronic lymphocytic leukemia of B-cell type not having achieved remission: Secondary | ICD-10-CM

## 2024-03-08 MED ORDER — DIPHENHYDRAMINE HCL 50 MG/ML IJ SOLN
25.0000 mg | Freq: Once | INTRAMUSCULAR | Status: AC
  Administered 2024-03-08: 25 mg via INTRAVENOUS
  Filled 2024-03-08: qty 1

## 2024-03-08 MED ORDER — ACETAMINOPHEN 325 MG PO TABS
650.0000 mg | ORAL_TABLET | Freq: Once | ORAL | Status: AC
  Administered 2024-03-08: 650 mg via ORAL
  Filled 2024-03-08: qty 2

## 2024-03-08 MED ORDER — SODIUM CHLORIDE 0.9% IV SOLUTION
250.0000 mL | INTRAVENOUS | Status: DC
  Administered 2024-03-08: 100 mL via INTRAVENOUS
  Filled 2024-03-08: qty 250

## 2024-03-09 LAB — BPAM RBC
Blood Product Expiration Date: 202506172359
Blood Product Expiration Date: 202506202359
ISSUE DATE / TIME: 202505300945
ISSUE DATE / TIME: 202505301127
Unit Type and Rh: 600
Unit Type and Rh: 6200

## 2024-03-09 LAB — TYPE AND SCREEN
ABO/RH(D): A POS
Antibody Screen: NEGATIVE
Unit division: 0
Unit division: 0

## 2024-03-12 ENCOUNTER — Ambulatory Visit (INDEPENDENT_AMBULATORY_CARE_PROVIDER_SITE_OTHER): Admitting: Vascular Surgery

## 2024-03-12 VITALS — BP 148/72 | HR 56 | Resp 12 | Ht 66.0 in | Wt 192.0 lb

## 2024-03-12 DIAGNOSIS — E1142 Type 2 diabetes mellitus with diabetic polyneuropathy: Secondary | ICD-10-CM

## 2024-03-12 DIAGNOSIS — I7143 Infrarenal abdominal aortic aneurysm, without rupture: Secondary | ICD-10-CM | POA: Diagnosis not present

## 2024-03-12 DIAGNOSIS — I10 Essential (primary) hypertension: Secondary | ICD-10-CM

## 2024-03-12 DIAGNOSIS — N184 Chronic kidney disease, stage 4 (severe): Secondary | ICD-10-CM | POA: Insufficient documentation

## 2024-03-12 DIAGNOSIS — D696 Thrombocytopenia, unspecified: Secondary | ICD-10-CM

## 2024-03-12 NOTE — Assessment & Plan Note (Signed)
 Risk of renal failure with any contrast load progressing to ESRD

## 2024-03-12 NOTE — Progress Notes (Unsigned)
 MRN : [id number]  Patrick Payne. is a 89 y.o. (July 06, 1934) male who presents with chief complaint of No chief complaint on file. Aaron Aas  History of Present Illness: Patient returns today in follow up of ***  Current Outpatient Medications  Medication Sig Dispense Refill   Aflibercept 2 MG/0.05ML SOLN Inject 1 Dose into the eye as directed. One injection into left eye every 8-9 weeks     atorvastatin  (LIPITOR) 10 MG tablet Take 10 mg by mouth at bedtime.      BD PEN NEEDLE NANO 2ND GEN 32G X 4 MM MISC USE AS DIRECTED ONCE A DAY     carvedilol  (COREG ) 12.5 MG tablet Take 12.5 mg by mouth 2 (two) times daily with a meal.     Cholecalciferol (VITAMIN D -3) 5000 units TABS Take 5,000 Units by mouth at bedtime.      clonazePAM  (KLONOPIN ) 1 MG tablet Take 1 mg by mouth at bedtime.      CONTOUR NEXT TEST test strip daily.     cyanocobalamin 500 MCG tablet Take 1,000 mcg by mouth at bedtime.      diphenoxylate -atropine  (LOMOTIL ) 2.5-0.025 MG tablet Take 1 tablet by mouth 4 (four) times daily as needed for diarrhea or loose stools. 60 tablet 1   empagliflozin (JARDIANCE) 10 MG TABS tablet 10 mg daily.     epoetin  alfa-epbx (RETACRIT ) 40000 UNIT/ML injection Inject 40,000 Units into the skin every 6 (six) weeks. Depending on lab results (if needed)     fexofenadine (ALLEGRA) 180 MG tablet Take 180 mg by mouth daily as needed for allergies. (Patient not taking: Reported on 01/25/2024)     finasteride  (PROSCAR ) 5 MG tablet Take 1 tablet (5 mg total) by mouth daily. 90 tablet 3   folic acid  (FOLVITE ) 400 MCG tablet Take 400 mcg by mouth daily.     ibrutinib  (IMBRUVICA ) 420 MG tablet Take 1 tablet (420 mg total) by mouth daily. Take with a glass of water. 28 tablet 1   insulin  glargine (LANTUS ) 100 UNIT/ML injection Inject 20-30 Units into the skin every morning. Takes Lantus  20 units if CBG less than 120 mg/dl or Lantus  30 units if CBG is over 120 mg/dl     Multiple Vitamins-Minerals (PRESERVISION  AREDS 2 PO) Take 1 capsule by mouth 2 (two) times daily.      prednisoLONE acetate (PRED FORTE) 1 % ophthalmic suspension Place 1 drop into the right eye 4 (four) times daily.     sertraline (ZOLOFT) 50 MG tablet Take 100 mg by mouth daily.     tamsulosin  (FLOMAX ) 0.4 MG CAPS capsule TAKE 1 CAPSULE(0.4 MG) BY MOUTH DAILY AFTER SUPPER 90 capsule 3   telmisartan (MICARDIS) 40 MG tablet Take 40 mg by mouth daily.     torsemide (DEMADEX) 10 MG tablet Take by mouth.     triamcinolone (NASACORT) 55 MCG/ACT AERO nasal inhaler Place 2 sprays into the nose 2 (two) times daily as needed (allergies).      No current facility-administered medications for this visit.   Facility-Administered Medications Ordered in Other Visits  Medication Dose Route Frequency Provider Last Rate Last Admin   epoetin  alfa-epbx (RETACRIT ) injection 40,000 Units  40,000 Units Subcutaneous Once Shellie Dials, MD        Past Medical History:  Diagnosis Date   Anxiety    CKD (chronic kidney disease), stage III (HCC)    CLL (chronic lymphocytic leukemia) (HCC)    Diabetes mellitus without complication (HCC)  Diverticulitis 04/09/2017   History of kidney stones    HLD (hyperlipidemia)    HTN (hypertension)    PKD (polycystic kidney disease)    Sleep apnea    Uncontrolled type 2 diabetes mellitus with hyperglycemia (HCC) 06/28/2018    Past Surgical History:  Procedure Laterality Date   CARDIAC CATHETERIZATION  12/1987   CENTRAL LINE INSERTION N/A 04/11/2017   Procedure: Central Line Insertion;  Surgeon: Jackquelyn Mass, MD;  Location: [facility];  Service: Cardiovascular;  Laterality: N/A;   CERVICAL FUSION     CHOLECYSTECTOMY     CYSTOSCOPY W/ RETROGRADES Left 05/12/2017   Procedure: CYSTOSCOPY WITH RETROGRADE PYELOGRAM;  Surgeon: Bart Born, MD;  Location: [facility];  Service: Urology;  Laterality: Left;   CYSTOSCOPY W/ URETERAL STENT PLACEMENT Left 05/12/2017   Procedure: CYSTOSCOPY WITH  STENT REMOVAL;  Surgeon: Bart Born, MD;  Location: [facility];  Service: Urology;  Laterality: Left;   CYSTOSCOPY WITH STENT PLACEMENT Left 04/11/2017   Procedure: CYSTOSCOPY WITH STENT PLACEMENT;  Surgeon: Christina Coyer, MD;  Location: [facility];  Service: Urology;  Laterality: Left;   URETEROSCOPY  05/12/2017   Procedure: URETEROSCOPY;  Surgeon: Bart Born, MD;  Location: [facility];  Service: Urology;;     Social History   Tobacco Use   Smoking status: Former    Types: Cigarettes    Passive exposure: Past   Smokeless tobacco: Never   Tobacco comments:    quit in Feb of 1997  Vaping Use   Vaping status: Never Used  Substance Use Topics   Alcohol use: No   Drug use: No      Family History  Problem Relation Age of Onset   Cancer Mother    Varicose Veins Mother    Cancer Father      Allergies  Allergen Reactions   Celecoxib Other (See Comments)    Increased Blood Pressure   Chlorpromazine Nausea Only   Iodinated Contrast Media Other (See Comments)    Stage 3 kidney disease, told IV dye would be bad for him   Prednisone Other (See Comments)    Diabetic   Amoxicillin-Pot Clavulanate Rash    Has patient had a PCN reaction causing immediate rash, facial/tongue/throat swelling, SOB or lightheadedness with hypotension: No Has patient had a PCN reaction causing severe rash involving mucus membranes or skin necrosis: No Has patient had a PCN reaction that required hospitalization: No Has patient had a PCN reaction occurring within the last 10 years: Yes If all of the above answers are "NO", then may proceed with Cephalosporin use.    Penicillin V Potassium Rash      REVIEW OF SYSTEMS (Negative unless checked)   Constitutional: [] Weight loss  [] Fever  [] Chills Cardiac: [] Chest pain   [] Chest pressure   [] Palpitations   [] Shortness of breath when laying flat   [] Shortness of breath at rest   [] Shortness of breath with exertion. Vascular:  [] Pain in legs  with walking   [] Pain in legs at rest   [] Pain in legs when laying flat   [] Claudication   [] Pain in feet when walking  [] Pain in feet at rest  [] Pain in feet when laying flat   [] History of DVT   [] Phlebitis   [] Swelling in legs   [] Varicose veins   [] Non-healing ulcers Pulmonary:   [] Uses home oxygen   [] Productive cough   [] Hemoptysis   [] Wheeze  [] COPD   [] Asthma Neurologic:  [] Dizziness  [] Blackouts   []   Seizures   [] History of stroke   [] History of TIA  [] Aphasia   [] Temporary blindness   [] Dysphagia   [] Weakness or numbness in arms   [] Weakness or numbness in legs Musculoskeletal:  [x] Arthritis   [] Joint swelling   [x] Joint pain   [] Low back pain Hematologic:  [] Easy bruising  [] Easy bleeding   [] Hypercoagulable state   [x] Anemic   Gastrointestinal:  [] Blood in stool   [] Vomiting blood  [x] Gastroesophageal reflux/heartburn   [] Abdominal pain Genitourinary:  [x] Chronic kidney disease   [] Difficult urination  [] Frequent urination  [] Burning with urination   [] Hematuria Skin:  [] Rashes   [] Ulcers   [] Wounds Psychological:  [x] History of anxiety   []  History of major depression.  Physical Examination  There were no vitals taken for this visit. Gen:  WD/WN, NAD Head: [place]/AT, No temporalis wasting. Ear/Nose/Throat: Hearing grossly intact, nares w/o erythema or drainage Eyes: Conjunctiva clear. Sclera non-icteric Neck: Supple.  Trachea midline Pulmonary:  Good air movement, no use of accessory muscles.  Cardiac: RRR, no JVD Vascular:  Vessel Right Left  Radial Palpable Palpable                          PT Palpable Palpable  DP Palpable Palpable   Gastrointestinal: soft, non-tender/non-distended. No guarding/reflex.  Musculoskeletal: M/S 5/5 throughout.  No deformity or atrophy. *** edema. Neurologic: Sensation grossly intact in extremities.  Symmetrical.  Speech is fluent.  Psychiatric: Judgment intact, Mood & affect appropriate for pt's clinical situation. Dermatologic: No rashes  or ulcers noted.  No cellulitis or open wounds.      Labs Recent Results (from the past 2160 hours)  CBC with Differential (Cancer Center Only)     Status: Abnormal   Collection Time: 12/13/23  1:44 PM  Result Value Ref Range   WBC Count 6.6 4.0 - 10.5 K/uL   RBC 1.88 (L) 4.22 - 5.81 MIL/uL   Hemoglobin 6.3 (LL) 13.0 - 17.0 g/dL    Comment: REPEATED TO VERIFY THIS CRITICAL RESULT HAS VERIFIED AND BEEN CALLED TO [facility],L BY CHASE ISLEY ON 03 05 2025 AT 1416, AND HAS BEEN READ BACK.     HCT 20.3 (L) 39.0 - 52.0 %   MCV 108.0 (H) 80.0 - 100.0 fL   MCH 33.5 26.0 - 34.0 pg   MCHC 31.0 30.0 - 36.0 g/dL   RDW 16.1 (H) 09.6 - 04.5 %   Platelet Count 24 (L) 150 - 400 K/uL    Comment: Immature Platelet Fraction may be clinically indicated, consider ordering this additional test [id number]    nRBC 0.0 0.0 - 0.2 %   Neutrophils Relative % 28 %   Neutro Abs 1.9 1.7 - 7.7 K/uL   Lymphocytes Relative 62 %   Lymphs Abs 4.1 (H) 0.7 - 4.0 K/uL   Monocytes Relative 9 %   Monocytes Absolute 0.6 0.1 - 1.0 K/uL   Eosinophils Relative 1 %   Eosinophils Absolute 0.1 0.0 - 0.5 K/uL   Basophils Relative 0 %   Basophils Absolute 0.0 0.0 - 0.1 K/uL   WBC Morphology SMUDGE CELLS     Comment: DIFF. CONFIRMED BY SMEAR   Smear Review Normal platelet morphology     Comment: PLATELETS APPEAR DECREASED   Immature Granulocytes 0 %   Abs Immature Granulocytes 0.01 0.00 - 0.07 K/uL   Target Cells PRESENT     Comment: Performed at [facility], [address] [place], [place] [address]  CMP (Cancer  Center only)     Status: Abnormal   Collection Time: 12/13/23  1:44 PM  Result Value Ref Range   Sodium 134 (L) 135 - 145 mmol/L   Potassium 4.6 3.5 - 5.1 mmol/L   Chloride 111 98 - 111 mmol/L   CO2 17 (L) 22 - 32 mmol/L   Glucose, Bld 163 (H) 70 - 99 mg/dL    Comment: Glucose reference range applies only to samples taken after fasting for at least 8 hours.   BUN 63 (H) 8 - 23 mg/dL   Creatinine  4.09 (H) 0.61 - 1.24 mg/dL   Calcium  7.8 (L) 8.9 - 10.3 mg/dL   Total Protein 4.6 (L) 6.5 - 8.1 g/dL   Albumin 2.8 (L) 3.5 - 5.0 g/dL   AST 15 15 - 41 U/L   ALT 18 0 - 44 U/L   Alkaline Phosphatase 127 (H) 38 - 126 U/L   Total Bilirubin 0.7 0.0 - 1.2 mg/dL   GFR, Estimated 19 (L) >60 mL/min    Comment: (NOTE) Calculated using the CKD-EPI Creatinine Equation (2021)    Anion gap 6 5 - 15    Comment: Performed at [facility], [address] [place], [place] [address]  Type and screen     Status: None   Collection Time: 12/13/23  1:44 PM  Result Value Ref Range   ABO/RH(D) A POS    Antibody Screen NEG    Sample Expiration 12/16/2023,2359    Unit Number [id number]    Blood Component Type RBC, LR IRR    Unit division 00    Status of Unit ISSUED,FINAL    Transfusion Status OK TO TRANSFUSE    Crossmatch Result      Compatible Performed at [facility], [address] [place], [place] [address]   BPAM RBC     Status: None   Collection Time: 12/13/23  1:44 PM  Result Value Ref Range   ISSUE DATE / TIME [id number]    Blood Product Unit Number [id number]    PRODUCT CODE [id number]    Unit Type and Rh 9500    Blood Product Expiration Date [id number]   Prepare RBC (crossmatch)     Status: None   Collection Time: 12/13/23  4:00 PM  Result Value Ref Range   Order Confirmation      ORDER PROCESSED BY BLOOD BANK Performed at [facility], [address] [place], [place] [address]   CMP (Cancer Center only)     Status: Abnormal   Collection Time: 12/27/23  1:26 PM  Result Value Ref Range   Sodium 135 135 - 145 mmol/L   Potassium 4.3 3.5 - 5.1 mmol/L   Chloride 112 (H) 98 - 111 mmol/L   CO2 19 (L) 22 - 32 mmol/L   Glucose, Bld 177 (H) 70 - 99 mg/dL    Comment: Glucose reference range applies only to samples taken after fasting for at least 8 hours.   BUN 51 (H) 8 - 23 mg/dL   Creatinine 1.66 (H) 0.63 - 1.24 mg/dL   Calcium  7.3 (L) 8.9 - 10.3  mg/dL   Total Protein 4.6 (L) 6.5 - 8.1 g/dL   Albumin 2.8 (L) 3.5 - 5.0 g/dL   AST 13 (L) 15 - 41 U/L   ALT 15 0 - 44 U/L   Alkaline Phosphatase 115 38 - 126 U/L   Total Bilirubin 0.6 0.0 - 1.2 mg/dL   GFR, Estimated 20 (L) >60 mL/min  Comment: (NOTE) Calculated using the CKD-EPI Creatinine Equation (2021)    Anion gap 4 (L) 5 - 15    Comment: Performed at [facility], [address] [place], [place] [address]  CBC with Differential/Platelet     Status: Abnormal   Collection Time: 12/27/23  1:26 PM  Result Value Ref Range   WBC 5.7 4.0 - 10.5 K/uL   RBC 2.10 (L) 4.22 - 5.81 MIL/uL   Hemoglobin 6.9 (LL) 13.0 - 17.0 g/dL    Comment: REPEATED TO VERIFY THIS CRITICAL RESULT HAS VERIFIED AND BEEN CALLED TO [facility],E BY CHASE ISLEY ON 03 19 2025 AT 1403, AND HAS BEEN READ BACK.     HCT 22.8 (L) 39.0 - 52.0 %   MCV 108.6 (H) 80.0 - 100.0 fL   MCH 32.9 26.0 - 34.0 pg   MCHC 30.3 30.0 - 36.0 g/dL   RDW 19.1 (H) 47.8 - 29.5 %   Platelets 21 (L) 150 - 400 K/uL    Comment: Immature Platelet Fraction may be clinically indicated, consider ordering this additional test [id number]    nRBC 0.0 0.0 - 0.2 %   Neutrophils Relative % 31 %   Neutro Abs 1.8 1.7 - 7.7 K/uL   Lymphocytes Relative 63 %   Lymphs Abs 3.5 0.7 - 4.0 K/uL   Monocytes Relative 4 %   Monocytes Absolute 0.3 0.1 - 1.0 K/uL   Eosinophils Relative 2 %   Eosinophils Absolute 0.1 0.0 - 0.5 K/uL   Basophils Relative 0 %   Basophils Absolute 0.0 0.0 - 0.1 K/uL   Immature Granulocytes 0 %   Abs Immature Granulocytes 0.01 0.00 - 0.07 K/uL    Comment: Performed at [facility], [address] [place], [place] [address]  Prepare RBC (crossmatch)     Status: None   Collection Time: 12/27/23  1:26 PM  Result Value Ref Range   Order Confirmation      ORDER PROCESSED BY BLOOD BANK Performed at [facility], [address] [place], [place] [address]   Type and screen     Status: None   Collection  Time: 12/27/23  1:26 PM  Result Value Ref Range   ABO/RH(D) A POS    Antibody Screen NEG    Sample Expiration 12/30/2023,2359    Unit Number [id number]    Blood Component Type RBC, LR IRR    Unit division 00    Status of Unit ISSUED,FINAL    Transfusion Status OK TO TRANSFUSE    Crossmatch Result      Compatible Performed at [facility], [address] [place], [place] [address]   BPAM RBC     Status: None   Collection Time: 12/27/23  1:26 PM  Result Value Ref Range   ISSUE DATE / TIME [id number]    Blood Product Unit Number [id number]    PRODUCT CODE [id number]    Unit Type and Rh 0600    Blood Product Expiration Date [id number]   CMP (Cancer Center only)     Status: Abnormal   Collection Time: 01/04/24  2:18 PM  Result Value Ref Range   Sodium 136 135 - 145 mmol/L   Potassium 4.6 3.5 - 5.1 mmol/L   Chloride 107 98 - 111 mmol/L   CO2 19 (L) 22 - 32 mmol/L   Glucose, Bld 145 (H) 70 - 99 mg/dL    Comment: Glucose reference range applies only to samples taken after fasting for at least 8 hours.  BUN 65 (H) 8 - 23 mg/dL   Creatinine 1.61 (H) 0.96 - 1.24 mg/dL   Calcium  7.8 (L) 8.9 - 10.3 mg/dL   Total Protein 4.9 (L) 6.5 - 8.1 g/dL   Albumin 2.9 (L) 3.5 - 5.0 g/dL   AST 19 15 - 41 U/L   ALT 18 0 - 44 U/L   Alkaline Phosphatase 138 (H) 38 - 126 U/L   Total Bilirubin 0.5 0.0 - 1.2 mg/dL   GFR, Estimated 18 (L) >60 mL/min    Comment: (NOTE) Calculated using the CKD-EPI Creatinine Equation (2021)    Anion gap 10 5 - 15    Comment: Performed at [facility], [address] [place], [place] [address]  CBC with Differential (Cancer Center Only)     Status: Abnormal   Collection Time: 01/04/24  2:18 PM  Result Value Ref Range   WBC Count 5.7 4.0 - 10.5 K/uL   RBC 2.32 (L) 4.22 - 5.81 MIL/uL   Hemoglobin 7.6 (L) 13.0 - 17.0 g/dL   HCT 98.1 (L) 19.1 - 47.8 %   MCV 106.5 (H) 80.0 - 100.0 fL   MCH 32.8 26.0 - 34.0 pg   MCHC 30.8 30.0 - 36.0 g/dL    RDW 29.5 (H) 62.1 - 15.5 %   Platelet Count 24 (L) 150 - 400 K/uL    Comment: Immature Platelet Fraction may be clinically indicated, consider ordering this additional test [id number]    nRBC 0.0 0.0 - 0.2 %   Neutrophils Relative % 25 %   Neutro Abs 1.5 (L) 1.7 - 7.7 K/uL   Lymphocytes Relative 69 %   Lymphs Abs 3.9 0.7 - 4.0 K/uL   Monocytes Relative 4 %   Monocytes Absolute 0.3 0.1 - 1.0 K/uL   Eosinophils Relative 2 %   Eosinophils Absolute 0.1 0.0 - 0.5 K/uL   Basophils Relative 0 %   Basophils Absolute 0.0 0.0 - 0.1 K/uL   Immature Granulocytes 0 %   Abs Immature Granulocytes 0.02 0.00 - 0.07 K/uL    Comment: Performed at [facility], [address] [place], [place] [address]  Type and screen     Status: None   Collection Time: 01/04/24  2:18 PM  Result Value Ref Range   ABO/RH(D) A POS    Antibody Screen NEG    Sample Expiration 01/07/2024,2359    Unit Number [id number]    Blood Component Type RBC, LR IRR    Unit division 00    Status of Unit ISSUED,FINAL    Transfusion Status OK TO TRANSFUSE    Crossmatch Result      Compatible Performed at [facility], [address] [place], [place] [address]   BPAM RBC     Status: None   Collection Time: 01/04/24  2:18 PM  Result Value Ref Range   ISSUE DATE / TIME [id number]    Blood Product Unit Number [id number]    PRODUCT CODE [id number]    Unit Type and Rh 5100    Blood Product Expiration Date [id number]   Prepare RBC (crossmatch)     Status: None   Collection Time: 01/04/24  4:30 PM  Result Value Ref Range   Order Confirmation      ORDER PROCESSED BY BLOOD BANK Performed at [facility], 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  A significant large lymphoid cell component is not present.  The background shows trilineage hematopoiesis with nonspecific changes.  Correlation with cytogenetic studies is recommended.  MICROSCOPIC DESCRIPTION:  PERIPHERAL BLOOD SMEAR: The red blood cells display mild to moderate anisopoikilocytosis with macrocytic cells, elliptocytes, teardrop cells. There is mild polychromasia.  The  white blood cells are normal in number but with relative abundance of lymphocytes primarily composed of small lymphoid cells with high nuclear cytoplasmic ratio, dense chromatin and inconspicuous nucleoli.  This is associated with abundance of smudge cells.  The platelets are decreased in number.  BONE MARROW ASPIRATE: Bone marrow particles present with degenerative cellular changes Erythroid precursors: Progressive maturation with occasional late precursors displaying nuclear cytoplasmic dyssynchrony Granulocytic precursors: Progressive maturation with scattered neutrophils displaying mild toxic granulation Megakaryocytes: Present including large and/or hyperchromatic forms Lymphocytes/plasma cells: The lymphocytes are relatively abundant representing 43% of all cells and primarily consist of small lymphoid cells with high nuclear cytoplasmic ratio, round nuclei, dense chromatin and inconspicuous nucleoli.  Occasional larger lymphoid cells are seen. Large plasma cell  aggregates are not present  TOUCH PREPARATIONS: A mixture of cell  types but with relative abundance of small lymphoid cells  CLOT AND BIOPSY: The core biopsy shows prominent aspiration artifacts. In a few relatively small and intact areas along with clot sections, the cellularity is estimated at 40-60% and shows interstitial infiltrates and several predominantly small clusters of primarily small lymphoid cells characterized by high nuclear cytoplasmic ratio, round to slightly irregular nuclei, dense chromatin and inconspicuous nucleoli.  This is seen in a background of a mixture of cell types.  Immunohistochemical stains for CD20, PAX5, CD3, CD5, CD10 and cyclin D1 were performed on block B1 with appropriate controls.  The lymphoid component shows predominance of B cells as seen with CD20 and PAX5 associated with CD5 co-expression.  No significant CD10 or cyclin D1 positivity is identified.  IRON STAIN: Iron stains are performed on a bone marrow aspirate or touch im print smear and section of clot. The controls stained appropriately.       Storage Iron: Present      Ring Sideroblasts: Absent  ADDITIONAL DATA/TESTING: The specimen was sent for cytogenetic analysis and a separate report will follow.  Flow cytometric analysis was performed [facility] [id number]) and shows a significant abnormal B-cell population representing 69% of all cells and displaying expression for CD5, CD19, CD20 and CD200 with extremely dim surface immunoglobulin light chain expression.  CELL COUNT DATA:  Bone Marrow count performed on 500 cells shows: Blasts:   0%   Myeloid:  28% Promyelocytes: 0%   Erythroid:     27% Myelocytes:    6%   Lymphocytes:   43% Metamyelocytes:     2%   Plasma cells:  1% Bands:    3% Neutrophils:   13%  M:E ratio:     1.03 Eosinophils:   4% Basophils:     0% Monocytes:     1%  Lab Data: CBC performed on 01/08/2024 shows:  WBC: 8.2 k/uL  Neutrophils:   36% Hgb: 8.5 g/dL  Lymphocytes:   16% HCT: 26.2 %    Monocytes:     4% MCV: 103 .6 fL   Eosinophils:   2% RDW: 18.3 %    Basophils:     0% PLT: 23 k/uL    GROSS DESCRIPTION:  Specimen A: Aspirate smear.  Specimen B: Received in B-plus is a 1.3 x 0.9 x 0.2 cm aggregate of dark red soft material, submitted in 1 block.  Specimen C: Received in B-plus are 2 cores of tan-red firm tissue, each 0.6 x 0.25 cm, submitted in 1 block following decalcification with Immunocal.  SW 01/08/2024   Final Diagnosis performed by Trenia Fritter, MD.   Electronically signed 01/10/2024 Technical and / or Professional components performed at [facility], [address], [place], [place] [address].  Immunohistochemistry Technical component (if applicable) was performed at [facility]. [address], [place], [place] [address].   IMMUNOHISTOCHEMISTRY DISCLAIMER (if applicable): Some of these immunohistochemical stains may have been developed and the performance characteristics determine by [facility]. Some  may not have been cleared or approved by the U.S. Food and Drug Administration. The FDA has determined that such clearance or approval is not necessary. This test is used for clinical purposes. It should not be regarded as investigational or for research. This laboratory is certified under the Clinical Laboratory Improvement Amendments of 1988 (CLIA-88) as qualified to perform high complexity clinical laboratory testing.  The controls stained appropriately.   IHC stains are performed on formalin fixed, paraffin embedded tissue using a 3,3"diaminobenzidine (DAB) chromogen and Leica Bond Autostainer System. The staining intensity of the nucleus is score manually and is reported as the percentage of tumor cell nuclei demonstrating specific nuclear staining. The specimens are fixed in 10% Neutral Formalin for at least 6 hours and up to 72hrs. These tests are validated on decalcified tissue. Results should be interpreted with  caution given the possibility of false negative r esults on decalcified specimens. Antibody Clones are as follows ER-clone 86F, PR-clone 16, Ki67- clone MM1. Some of these immunohistochemical stains may have been developed and the performance characteristics determined by [facility].   Surgical pathology     Status: None   Collection Time: 01/08/24 12:00 AM  Result Value Ref Range   SURGICAL PATHOLOGY      Surgical Pathology CASE: [id number] PATIENT: [place] Werk Flow Pathology Report     Clinical history: CLL, anemia, thrombocytopenia     DIAGNOSIS:  -Abnormal B-cell population identified -See comment  COMMENT:  Flow cytometric analysis shows an abnormal B-cell population representing 69% of all cells and expressing B-cell antigens including CD20 associated with CD5 and CD200 expression.  Surface immunoglobulin light chain expression is dim although there is possible kappa expression.  No abnormal T-cell population or significant CD34 positive blastic population identified.  The findings are consistent with involvement by a B-cell lymphoproliferative disorder and correlate with the morphologic features of chronic lymphocytic leukemia/small lymphocytic lymphoma seen in the bone marrow ([id number])  GATING AND PHENOTYPIC ANALYSIS:  Gated population: Flow cytometric immunophenotyping is performed using antibodies to the antigens listed in the table be low. Electronic gates are placed around a cell cluster displaying light scatter properties corresponding to: lymphocytes  Abnormal Cells in the sample: 69%  Phenotype of Abnormal Cells: CD5, CD19, CD20, CD200  Lymphoid Antigens       Myeloid Antigens Miscellaneous CD2  NEG  CD10 NEG  CD11b     ND   CD45 POS CD3  NEG  CD19 POS  CD11c     ND   HLA-Dr    ND CD4  NEG  CD20 POS  CD13 ND   CD34 NEG CD5  POS  CD22 ND   CD14 ND   CD38 NEG CD7  NEG  CD79b     ND   CD15 ND   CD138      ND CD8  NEG  CD103     ND   CD16 ND   TdT  ND CD25 ND   CD200     POS  CD33 ND   CD123     ND TCRab     ND   sKappa    DIM  CD64 ND   CD41 ND TCRgd     NEG  sLambda   DIM  CD117     ND   CD61 ND CD56 NEG  cKappa    ND   MPO  ND   CD71 ND CD57 ND   cLambda   ND        [id number]      GROSS DESCRIPTION:  Reference Bone Marrow case [id number].    Final Diagnosis performed by Trenia Fritter, MD.   Electronically signed 01/10/2024 Technical and / or Professional components  performed at [facility], [address], [place], [place] [address].  The above tests were developed and their performance characteristics determined by the [facility] system for the physical and immunophenotypic characterization of cell populations. They have not been cleared by the U.S. Food and Drug administration. The  FDA has determined that such clearance or approval is not necessary. This test is used for clinical purposes. It should not be  regarded as investigational or for research   CBC with Differential/Platelet     Status: Abnormal   Collection Time: 01/08/24  7:52 AM  Result Value Ref Range   WBC 8.2 4.0 - 10.5 K/uL   RBC 2.53 (L) 4.22 - 5.81 MIL/uL   Hemoglobin 8.5 (L) 13.0 - 17.0 g/dL   HCT 69.6 (L) 29.5 - 28.4 %   MCV 103.6 (H) 80.0 - 100.0 fL   MCH 33.6 26.0 - 34.0 pg   MCHC 32.4 30.0 - 36.0 g/dL   RDW 13.2 (H) 44.0 - 10.2 %   Platelets 23 (LL) 150 - 400 K/uL    Comment: SPECIMEN CHECKED FOR CLOTS Immature Platelet Fraction may be clinically indicated, consider ordering this additional test [id number] PLATELET COUNT CONFIRMED BY SMEAR THIS CRITICAL RESULT HAS VERIFIED AND BEEN CALLED TO ALLISON PATTERSON RN BY HANNAH MILES ON 03 31 2025 AT 0850, AND HAS BEEN READ BACK.     nRBC 0.0 0.0 - 0.2 %   Neutrophils Relative % 21 %   Neutro Abs 1.7 1.7 - 7.7 K/uL   Lymphocytes Relative 73 %   Lymphs Abs 6.0 (H) 0.7 - 4.0 K/uL   Monocytes Relative 4 %   Monocytes Absolute 0.3  0.1 - 1.0 K/uL   Eosinophils Relative 2 %   Eosinophils Absolute 0.1 0.0 - 0.5 K/uL   Basophils Relative 0 %   Basophils Absolute 0.0 0.0 - 0.1 K/uL   WBC Morphology      Lymphocytosis with morphology consistent with known diagnosis of CLL.   RBC Morphology MORPHOLOGY UNREMARKABLE    Smear Review  PLATELETS APPEAR DECREASED    Immature Granulocytes 0 %   Abs Immature Granulocytes 0.01 0.00 - 0.07 K/uL    Comment: Performed at [facility], [address] [place], [place] [address]  Pathologist smear review     Status: None   Collection Time: 01/08/24  7:52 AM  Result Value Ref Range   Path Review      LYMPHOCYTOSIS WITH ATYPICAL FORMS AND SMUDGE CELLS, CONSISTENT WITH CLINICAL HISTORY OF CLL.    Comment: MACROCYTIC ANEMIA AND THROMBOCYTOPENIA.   Reviewed by Mark S. [place], M.D. 01/08/2024 Performed at [facility], [address] [place], [place] [address]   Glucose, capillary     Status: None   Collection Time: 01/08/24  8:01 AM  Result Value Ref Range   Glucose-Capillary 84 70 - 99 mg/dL    Comment: Glucose reference range applies only to samples taken after fasting for at least 8 hours.  Glucose, capillary     Status: None   Collection Time: 01/08/24  9:52 AM  Result Value Ref Range   Glucose-Capillary 94 70 - 99 mg/dL    Comment: Glucose reference range applies only to samples taken after fasting for at least 8 hours.  Hold Tube- Blood Bank     Status: None   Collection Time: 01/11/24  1:36 PM  Result Value Ref Range   Blood Bank Specimen SAMPLE AVAILABLE FOR TESTING    Sample Expiration      01/14/2024,2359 Performed at [facility], [address] [place], [place] [address]   CMP (Cancer Center only)     Status: Abnormal   Collection Time: 01/11/24  1:36 PM  Result Value Ref Range   Sodium 135 135 - 145 mmol/L   Potassium 4.7 3.5 - 5.1 mmol/L   Chloride 110 98 - 111 mmol/L   CO2 18 (L) 22 - 32 mmol/L   Glucose, Bld 153 (H) 70 - 99  mg/dL    Comment: Glucose reference range applies only to samples taken after fasting for at least 8 hours.   BUN 62 (H) 8 - 23 mg/dL   Creatinine 0.10 (H) 9.32 - 1.24 mg/dL   Calcium  7.5 (L) 8.9 - 10.3 mg/dL   Total Protein 4.6 (L) 6.5 - 8.1 g/dL   Albumin 2.8 (L) 3.5 - 5.0 g/dL   AST 16 15 - 41 U/L   ALT 18 0 - 44 U/L   Alkaline Phosphatase 134 (H) 38 - 126 U/L   Total Bilirubin 0.8 0.0 - 1.2 mg/dL   GFR, Estimated 18 (L) >60 mL/min    Comment: (NOTE) Calculated using the CKD-EPI Creatinine Equation (2021)    Anion gap 7 5 - 15    Comment: Performed at [facility], [address] [place], [place] [address]  CBC with Differential (Cancer Center Only)     Status: Abnormal   Collection Time: 01/11/24  1:36 PM  Result Value Ref Range   WBC Count 7.3 4.0 - 10.5 K/uL   RBC 2.32 (L) 4.22 - 5.81 MIL/uL   Hemoglobin 7.5 (L) 13.0 - 17.0 g/dL   HCT 22.0 (L) 25.4 - 27.0 %   MCV 104.7 (H) 80.0 - 100.0 fL   MCH 32.3 26.0 - 34.0 pg   MCHC 30.9 30.0 - 36.0 g/dL   RDW 62.3 (H) 76.2 - 83.1 %   Platelet Count 27 (L) 150 - 400 K/uL    Comment: Immature Platelet Fraction may be clinically indicated, consider ordering this additional  test [id number]    nRBC 0.0 0.0 - 0.2 %   Neutrophils Relative % 25 %   Neutro Abs 1.8 1.7 - 7.7 K/uL   Lymphocytes Relative 66 %   Lymphs Abs 4.7 (H) 0.7 - 4.0 K/uL   Monocytes Relative 8 %   Monocytes Absolute 0.6 0.1 - 1.0 K/uL   Eosinophils Relative 1 %   Eosinophils Absolute 0.1 0.0 - 0.5 K/uL   Basophils Relative 0 %   Basophils Absolute 0.0 0.0 - 0.1 K/uL   WBC Morphology      Lymphocytosis with morphology consistent with known diagnosis of CLL.   Smear Review PLATELETS APPEAR DECREASED     Comment: LARGE PLATELETS   Immature Granulocytes 0 %   Abs Immature Granulocytes 0.02 0.00 - 0.07 K/uL   Smudge Cells PRESENT    Tear Drop Cells PRESENT    Ovalocytes PRESENT     Comment: Performed at [facility], [address]  [place], [place] [address]  Iron and TIBC     Status: None   Collection Time: 01/11/24  1:36 PM  Result Value Ref Range   Iron 79 45 - 182 ug/dL   TIBC 401 027 - 253 ug/dL   Saturation Ratios 26 17.9 - 39.5 %   UIBC 225 ug/dL    Comment: Performed at [facility], [address] [place], [place] [address]  Ferritin     Status: None   Collection Time: 01/11/24  1:36 PM  Result Value Ref Range   Ferritin 142 24 - 336 ng/mL    Comment: Performed at [facility], [address] [place], [place] [address]  Type and screen     Status: None   Collection Time: 01/11/24  1:36 PM  Result Value Ref Range   ABO/RH(D) A POS    Antibody Screen NEG    Sample Expiration 01/14/2024,2359    Unit Number [id number]    Blood Component Type RBC, LR IRR    Unit division 00    Status of Unit ISSUED,FINAL    Transfusion Status OK TO TRANSFUSE    Crossmatch Result      Compatible Performed at [facility], [address] [place], [place] [address]   BPAM RBC     Status: None   Collection Time: 01/11/24  1:36 PM  Result Value Ref Range   ISSUE DATE / TIME 416606301601    Blood Product Unit Number [id number]    PRODUCT CODE [id number]    Unit Type and Rh 5100    Blood Product Expiration Date [id number]   Prepare RBC (crossmatch)     Status: None   Collection Time: 01/11/24  4:00 PM  Result Value Ref Range   Order Confirmation      ORDER PROCESSED BY BLOOD BANK Performed at [facility], [address] [place], [place] [address]   CBC with Differential (Cancer Center Only)     Status: Abnormal   Collection Time: 01/18/24  1:41 PM  Result Value Ref Range   WBC Count 6.6 4.0 - 10.5 K/uL   RBC 2.49 (L) 4.22 - 5.81 MIL/uL   Hemoglobin 8.1 (L) 13.0 - 17.0 g/dL   HCT 06.2 (L) 69.4 - 85.4 %   MCV 103.6 (H) 80.0 - 100.0 fL   MCH 32.5 26.0 - 34.0 pg   MCHC 31.4 30.0 - 36.0 g/dL   RDW 62.7 (H) 03.5 - 00.9 %   Platelet Count 25 (L) 150 - 400  K/uL     Comment: SPECIMEN CHECKED FOR CLOTS Immature Platelet Fraction may be clinically indicated, consider ordering this additional test [id number]    nRBC 0.0 0.0 - 0.2 %   Neutrophils Relative % 24 %   Neutro Abs 1.6 (L) 1.7 - 7.7 K/uL   Lymphocytes Relative 70 %   Lymphs Abs 4.6 (H) 0.7 - 4.0 K/uL   Monocytes Relative 4 %   Monocytes Absolute 0.3 0.1 - 1.0 K/uL   Eosinophils Relative 2 %   Eosinophils Absolute 0.1 0.0 - 0.5 K/uL   Basophils Relative 0 %   Basophils Absolute 0.0 0.0 - 0.1 K/uL   Immature Granulocytes 0 %   Abs Immature Granulocytes 0.01 0.00 - 0.07 K/uL    Comment: Performed at [facility], [address] [place], [place] [address]  CMP (Cancer Center only)     Status: Abnormal   Collection Time: 01/18/24  1:41 PM  Result Value Ref Range   Sodium 137 135 - 145 mmol/L   Potassium 5.1 3.5 - 5.1 mmol/L   Chloride 110 98 - 111 mmol/L   CO2 20 (L) 22 - 32 mmol/L   Glucose, Bld 171 (H) 70 - 99 mg/dL    Comment: Glucose reference range applies only to samples taken after fasting for at least 8 hours.   BUN 71 (H) 8 - 23 mg/dL   Creatinine 7.42 (H) 5.95 - 1.24 mg/dL   Calcium  7.8 (L) 8.9 - 10.3 mg/dL   Total Protein 4.6 (L) 6.5 - 8.1 g/dL   Albumin 2.7 (L) 3.5 - 5.0 g/dL   AST 17 15 - 41 U/L   ALT 21 0 - 44 U/L   Alkaline Phosphatase 122 38 - 126 U/L   Total Bilirubin 0.9 0.0 - 1.2 mg/dL   GFR, Estimated 17 (L) >60 mL/min    Comment: (NOTE) Calculated using the CKD-EPI Creatinine Equation (2021)    Anion gap 7 5 - 15    Comment: Performed at [facility], [address] [place], [place] [address]  Type and screen     Status: None   Collection Time: 01/18/24  1:41 PM  Result Value Ref Range   ABO/RH(D) A POS    Antibody Screen NEG    Sample Expiration 01/21/2024,2359    Unit Number [id number]    Blood Component Type RCLI PHER 2    Unit division 00    Status of Unit ISSUED,FINAL    Transfusion Status OK TO TRANSFUSE    Crossmatch Result       Compatible Performed at [facility], [address] [place], [place] [address]   BPAM RBC     Status: None   Collection Time: 01/18/24  1:41 PM  Result Value Ref Range   ISSUE DATE / TIME [id number]    Blood Product Unit Number [id number]    PRODUCT CODE [id number]    Unit Type and Rh 0600    Blood Product Expiration Date [id number]   Prepare RBC (crossmatch)     Status: None   Collection Time: 01/18/24  3:30 PM  Result Value Ref Range   Order Confirmation      ORDER PROCESSED BY BLOOD BANK Performed at [facility], [address] [place], [place] [address]   CBC with Differential (Cancer Center Only)     Status: Abnormal   Collection Time: 01/25/24  1:53 PM  Result Value Ref Range   WBC Count 14.7 (H) 4.0 - 10.5 K/uL  RBC 2.43 (L) 4.22 - 5.81 MIL/uL   Hemoglobin 7.9 (L) 13.0 - 17.0 g/dL   HCT 16.1 (L) 09.6 - 04.5 %   MCV 105.3 (H) 80.0 - 100.0 fL   MCH 32.5 26.0 - 34.0 pg   MCHC 30.9 30.0 - 36.0 g/dL   RDW 40.9 (H) 81.1 - 91.4 %   Platelet Count 16 (L) 150 - 400 K/uL    Comment: Immature Platelet Fraction may be clinically indicated, consider ordering this additional test [id number]    nRBC 0.0 0.0 - 0.2 %   Neutrophils Relative % 18 %   Neutro Abs 2.6 1.7 - 7.7 K/uL   Lymphocytes Relative 80 %   Lymphs Abs 11.8 (H) 0.7 - 4.0 K/uL   Monocytes Relative 1 %   Monocytes Absolute 0.2 0.1 - 1.0 K/uL   Eosinophils Relative 1 %   Eosinophils Absolute 0.1 0.0 - 0.5 K/uL   Basophils Relative 0 %   Basophils Absolute 0.0 0.0 - 0.1 K/uL   Immature Granulocytes 0 %   Abs Immature Granulocytes 0.03 0.00 - 0.07 K/uL    Comment: Performed at [facility], [address] [place], [place] [address]  Hold Tube- Blood Bank     Status: None   Collection Time: 01/25/24  1:53 PM  Result Value Ref Range   Blood Bank Specimen SAMPLE AVAILABLE FOR TESTING    Sample Expiration      01/28/2024,2359 Performed at [facility], [address] [place], [place] [address]   CMP (Cancer Center only)     Status: Abnormal   Collection Time: 01/25/24  1:53 PM  Result Value Ref Range   Sodium 137 135 - 145 mmol/L   Potassium 4.7 3.5 - 5.1 mmol/L   Chloride 112 (H) 98 - 111 mmol/L   CO2 18 (L) 22 - 32 mmol/L   Glucose, Bld 171 (H) 70 - 99 mg/dL    Comment: Glucose reference range applies only to samples taken after fasting for at least 8 hours.   BUN 65 (H) 8 - 23 mg/dL   Creatinine 6.29 (H) 5.28 - 1.24 mg/dL   Calcium  7.5 (L) 8.9 - 10.3 mg/dL   Total Protein 4.8 (L) 6.5 - 8.1 g/dL   Albumin 2.9 (L) 3.5 - 5.0 g/dL   AST 15 15 - 41 U/L   ALT 20 0 - 44 U/L   Alkaline Phosphatase 132 (H) 38 - 126 U/L   Total Bilirubin 0.9 0.0 - 1.2 mg/dL   GFR, Estimated 16 (L) >60 mL/min    Comment: (NOTE) Calculated using the CKD-EPI Creatinine Equation (2021)    Anion gap 7 5 - 15    Comment: Performed at [facility], [address] [place], [place] [address]  Type and screen     Status: None   Collection Time: 01/25/24  1:53 PM  Result Value Ref Range   ABO/RH(D) A POS    Antibody Screen NEG    Sample Expiration 01/28/2024,2359    Unit Number [id number]    Blood Component Type RBC, LR IRR    Unit division 00    Status of Unit ISSUED,FINAL    Transfusion Status OK TO TRANSFUSE    Crossmatch Result      Compatible Performed at [facility] [address] [place], [place] [address]   BPAM RBC     Status: None   Collection Time: 01/25/24  1:53 PM  Result Value Ref Range   ISSUE  DATE / TIME [id number]    Blood Product Unit Number [id number]    PRODUCT CODE [id number]    Unit Type and Rh 5100    Blood Product Expiration Date [id number]   Prepare RBC (crossmatch)     Status: None   Collection Time: 01/25/24  5:00 PM  Result Value Ref Range   Order Confirmation      ORDER PROCESSED BY BLOOD BANK Performed at [facility], [address] [place], [place] [address]   CBC with Differential  (Cancer Center Only)     Status: Abnormal   Collection Time: 02/01/24  2:01 PM  Result Value Ref Range   WBC Count 19.9 (H) 4.0 - 10.5 K/uL   RBC 2.62 (L) 4.22 - 5.81 MIL/uL   Hemoglobin 8.6 (L) 13.0 - 17.0 g/dL   HCT 41.3 (L) 24.4 - 01.0 %   MCV 103.4 (H) 80.0 - 100.0 fL   MCH 32.8 26.0 - 34.0 pg   MCHC 31.7 30.0 - 36.0 g/dL   RDW 27.2 (H) 53.6 - 64.4 %   Platelet Count 19 (L) 150 - 400 K/uL    Comment: Immature Platelet Fraction may be clinically indicated, consider ordering this additional test [id number]    nRBC 0.0 0.0 - 0.2 %   Neutrophils Relative % 13 %   Neutro Abs 2.6 1.7 - 7.7 K/uL   Lymphocytes Relative 85 %   Lymphs Abs 16.9 (H) 0.7 - 4.0 K/uL   Monocytes Relative 1 %   Monocytes Absolute 0.2 0.1 - 1.0 K/uL   Eosinophils Relative 1 %   Eosinophils Absolute 0.1 0.0 - 0.5 K/uL   Basophils Relative 0 %   Basophils Absolute 0.1 0.0 - 0.1 K/uL   WBC Morphology DIFF. CONFIRMED BY SMEAR    RBC Morphology MORPHOLOGY UNREMARKABLE    Smear Review Normal platelet morphology     Comment: PLATELETS APPEAR DECREASED PLATELET COUNT CONFIRMED BY SMEAR    Immature Granulocytes 0 %   Abs Immature Granulocytes 0.05 0.00 - 0.07 K/uL    Comment: Performed at [facility] [address] [place], [place] [address]  Hold Tube- Blood Bank     Status: None   Collection Time: 02/01/24  2:01 PM  Result Value Ref Range   Blood Bank Specimen SAMPLE AVAILABLE FOR TESTING    Sample Expiration      02/04/2024,2359 Performed at [facility], [address] [place], [place] [address]   CMP (Cancer Center only)     Status: Abnormal   Collection Time: 02/01/24  2:01 PM  Result Value Ref Range   Sodium 137 135 - 145 mmol/L   Potassium 4.7 3.5 - 5.1 mmol/L   Chloride 111 98 - 111 mmol/L   CO2 19 (L) 22 - 32 mmol/L   Glucose, Bld 144 (H) 70 - 99 mg/dL    Comment: Glucose reference range applies only to samples taken after fasting for at least 8 hours.   BUN 63 (H) 8 - 23  mg/dL   Creatinine 2.95 (H) 1.88 - 1.24 mg/dL   Calcium  7.8 (L) 8.9 - 10.3 mg/dL   Total Protein 4.9 (L) 6.5 - 8.1 g/dL   Albumin 3.0 (L) 3.5 - 5.0 g/dL   AST 17 15 - 41 U/L   ALT 24 0 - 44 U/L   Alkaline Phosphatase 124 38 - 126 U/L   Total Bilirubin 0.6 0.0 - 1.2 mg/dL   GFR, Estimated 16 (L) >60 mL/min  Comment: (NOTE) Calculated using the CKD-EPI Creatinine Equation (2021)    Anion gap 7 5 - 15    Comment: Performed at [facility], [address] [place], [place] [address]  Type and screen     Status: None   Collection Time: 02/08/24  1:57 PM  Result Value Ref Range   ABO/RH(D) A POS    Antibody Screen NEG    Sample Expiration 02/11/2024,2359    Unit Number [id number]    Blood Component Type RBC, LR IRR    Unit division 00    Status of Unit ISSUED,FINAL    Transfusion Status OK TO TRANSFUSE    Crossmatch Result      Compatible Performed at [facility], [address] [place], [place] [address]    Unit Number [phone]    Blood Component Type RCLI PHER 1    Unit division 00    Status of Unit ISSUED,FINAL    Transfusion Status OK TO TRANSFUSE    Crossmatch Result Compatible   BPAM RBC     Status: None   Collection Time: 02/08/24  1:57 PM  Result Value Ref Range   ISSUE DATE / TIME [id number]    Blood Product Unit Number [id number]    PRODUCT CODE [id number]    Unit Type and Rh 5100    Blood Product Expiration Date [id number]    ISSUE DATE / TIME [id number]    Blood Product Unit Number [id number]    PRODUCT CODE [id number]    Unit Type and Rh 9500    Blood Product Expiration Date [id number]   CBC with Differential (Cancer Center Only)     Status: Abnormal   Collection Time: 02/08/24  1:58 PM  Result Value Ref Range   WBC Count 18.0 (H) 4.0 - 10.5 K/uL   RBC 2.16 (L) 4.22 - 5.81 MIL/uL   Hemoglobin 7.0 (L) 13.0 - 17.0 g/dL   HCT 62.8 (L) 31.5 - 17.6 %   MCV 105.1 (H) 80.0 - 100.0 fL   MCH 32.4 26.0 - 34.0 pg   MCHC 30.8  30.0 - 36.0 g/dL   RDW 16.0 (H) 73.7 - 10.6 %   Platelet Count 20 (L) 150 - 400 K/uL    Comment: Immature Platelet Fraction may be clinically indicated, consider ordering this additional test [id number]    nRBC 0.0 0.0 - 0.2 %   Neutrophils Relative % 13 %   Neutro Abs 2.3 1.7 - 7.7 K/uL   Lymphocytes Relative 86 %   Lymphs Abs 15.4 (H) 0.7 - 4.0 K/uL   Monocytes Relative 1 %   Monocytes Absolute 0.2 0.1 - 1.0 K/uL   Eosinophils Relative 0 %   Eosinophils Absolute 0.1 0.0 - 0.5 K/uL   Basophils Relative 0 %   Basophils Absolute 0.0 0.0 - 0.1 K/uL   WBC Morphology      Lymphocytosis with morphology consistent with known diagnosis of CLL.   RBC Morphology MIXED RBC POPULATION    Smear Review Normal platelet morphology     Comment: PLATELETS APPEAR DECREASED   Immature Granulocytes 0 %   Abs Immature Granulocytes 0.05 0.00 - 0.07 K/uL    Comment: Performed at [facility], [address] [place], [place] [address]  Hold Tube- Blood Bank     Status: None   Collection Time: 02/08/24  1:58 PM  Result Value Ref Range   Blood Bank Specimen SAMPLE AVAILABLE FOR TESTING    Sample Expiration  02/11/2024,2359 Performed at [facility], [address] [place], [place] [address]   CMP (Cancer Center only)     Status: Abnormal   Collection Time: 02/08/24  1:58 PM  Result Value Ref Range   Sodium 135 135 - 145 mmol/L   Potassium 4.6 3.5 - 5.1 mmol/L   Chloride 110 98 - 111 mmol/L   CO2 18 (L) 22 - 32 mmol/L   Glucose, Bld 132 (H) 70 - 99 mg/dL    Comment: Glucose reference range applies only to samples taken after fasting for at least 8 hours.   BUN 64 (H) 8 - 23 mg/dL   Creatinine 1.91 (H) 4.78 - 1.24 mg/dL   Calcium  7.5 (L) 8.9 - 10.3 mg/dL   Total Protein 4.9 (L) 6.5 - 8.1 g/dL   Albumin 2.9 (L) 3.5 - 5.0 g/dL   AST 20 15 - 41 U/L   ALT 24 0 - 44 U/L   Alkaline Phosphatase 122 38 - 126 U/L   Total Bilirubin 0.4 0.0 - 1.2 mg/dL   GFR, Estimated 16 (L) >60  mL/min    Comment: (NOTE) Calculated using the CKD-EPI Creatinine Equation (2021)    Anion gap 7 5 - 15    Comment: Performed at [facility], [address] [place], [place] [address]  Prepare RBC (crossmatch)     Status: None   Collection Time: 02/08/24  4:30 PM  Result Value Ref Range   Order Confirmation      ORDER PROCESSED BY BLOOD BANK Performed at [facility], [address] [place], [place] [address]   CBC with Differential (Cancer Center Only)     Status: Abnormal   Collection Time: 02/15/24 10:00 AM  Result Value Ref Range   WBC Count 17.6 (H) 4.0 - 10.5 K/uL   RBC 2.22 (L) 4.22 - 5.81 MIL/uL   Hemoglobin 7.2 (L) 13.0 - 17.0 g/dL   HCT 57.8 (L) 46.9 - 62.9 %   MCV 102.3 (H) 80.0 - 100.0 fL   MCH 32.4 26.0 - 34.0 pg   MCHC 31.7 30.0 - 36.0 g/dL   RDW 52.8 (H) 41.3 - 24.4 %   Platelet Count 18 (L) 150 - 400 K/uL    Comment: Immature Platelet Fraction may be clinically indicated, consider ordering this additional test [id number]    nRBC 0.0 0.0 - 0.2 %   Neutrophils Relative % 12 %   Neutro Abs 2.2 1.7 - 7.7 K/uL   Lymphocytes Relative 86 %   Lymphs Abs 15.2 (H) 0.7 - 4.0 K/uL   Monocytes Relative 1 %   Monocytes Absolute 0.2 0.1 - 1.0 K/uL   Eosinophils Relative 1 %   Eosinophils Absolute 0.1 0.0 - 0.5 K/uL   Basophils Relative 0 %   Basophils Absolute 0.0 0.0 - 0.1 K/uL   Immature Granulocytes 0 %   Abs Immature Granulocytes 0.03 0.00 - 0.07 K/uL    Comment: Performed at [facility], [address] [place], [place] [address]  Hold Tube- Blood Bank     Status: None   Collection Time: 02/15/24 10:00 AM  Result Value Ref Range   Blood Bank Specimen SAMPLE AVAILABLE FOR TESTING    Sample Expiration      02/18/2024,2359 Performed at [facility], [address], [place], [place] [address]   CMP (Cancer Center only)     Status: Abnormal   Collection Time: 02/15/24 10:00 AM  Result Value Ref Range   Sodium 136  135 - 145  mmol/L   Potassium 4.5 3.5 - 5.1 mmol/L   Chloride 110 98 - 111 mmol/L   CO2 18 (L) 22 - 32 mmol/L   Glucose, Bld 122 (H) 70 - 99 mg/dL    Comment: Glucose reference range applies only to samples taken after fasting for at least 8 hours.   BUN 67 (H) 8 - 23 mg/dL   Creatinine 4.74 (H) 2.59 - 1.24 mg/dL   Calcium  7.4 (L) 8.9 - 10.3 mg/dL   Total Protein 4.7 (L) 6.5 - 8.1 g/dL   Albumin 2.8 (L) 3.5 - 5.0 g/dL   AST 19 15 - 41 U/L   ALT 25 0 - 44 U/L   Alkaline Phosphatase 118 38 - 126 U/L   Total Bilirubin 0.9 0.0 - 1.2 mg/dL   GFR, Estimated 17 (L) >60 mL/min    Comment: (NOTE) Calculated using the CKD-EPI Creatinine Equation (2021)    Anion gap 8 5 - 15    Comment: Performed at [facility], [address] [place], [place] [address]  Type and screen     Status: None   Collection Time: 02/15/24 10:00 AM  Result Value Ref Range   ABO/RH(D) A POS    Antibody Screen NEG    Sample Expiration 02/18/2024,2359    Unit Number [id number]    Blood Component Type RCLI PHER 1    Unit division 00    Status of Unit ISSUED,FINAL    Transfusion Status OK TO TRANSFUSE    Crossmatch Result Compatible    Unit Number [id number]    Blood Component Type RBC, LR IRR    Unit division 00    Status of Unit ISSUED,FINAL    Transfusion Status OK TO TRANSFUSE    Crossmatch Result      Compatible Performed at [facility], [address] [place], [place] [address]   BPAM RBC     Status: None   Collection Time: 02/15/24 10:00 AM  Result Value Ref Range   ISSUE DATE / TIME [id number]    Blood Product Unit Number [id number]    PRODUCT CODE [id number]    Unit Type and Rh 6200    Blood Product Expiration Date 694854627035    ISSUE DATE / TIME 009381829937    Blood Product Unit Number [id number]    PRODUCT CODE [id number]    Unit Type and Rh 5100    Blood Product Expiration Date [id number]   Prepare RBC (crossmatch)     Status: None   Collection Time: 02/15/24  2:31  PM  Result Value Ref Range   Order Confirmation      ORDER PROCESSED BY BLOOD BANK Performed at [facility], [address] [place], [place] [address]   Hold Tube- Blood Bank     Status: None   Collection Time: 02/22/24 11:07 AM  Result Value Ref Range   Blood Bank Specimen SAMPLE AVAILABLE FOR TESTING    Sample Expiration      02/25/2024,2359 Performed at [facility], [address] [place], [place] [address]   CBC with Differential/Platelet     Status: Abnormal   Collection Time: 02/22/24 11:07 AM  Result Value Ref Range   WBC 16.5 (H) 4.0 - 10.5 K/uL   RBC 2.40 (L) 4.22 - 5.81 MIL/uL   Hemoglobin 7.6 (L) 13.0 - 17.0 g/dL   HCT 61.9 (L) 50.9 - 32.6 %   MCV 101.7 (H) 80.0 - 100.0 fL   MCH 31.7  26.0 - 34.0 pg   MCHC 31.1 30.0 - 36.0 g/dL   RDW 47.8 (H) 29.5 - 62.1 %   Platelets 19 (L) 150 - 400 K/uL    Comment: Immature Platelet Fraction may be clinically indicated, consider ordering this additional test [id number]    nRBC 0.0 0.0 - 0.2 %   Neutrophils Relative % 12 %   Neutro Abs 2.0 1.7 - 7.7 K/uL   Lymphocytes Relative 87 %   Lymphs Abs 14.2 (H) 0.7 - 4.0 K/uL   Monocytes Relative 1 %   Monocytes Absolute 0.2 0.1 - 1.0 K/uL   Eosinophils Relative 0 %   Eosinophils Absolute 0.1 0.0 - 0.5 K/uL   Basophils Relative 0 %   Basophils Absolute 0.1 0.0 - 0.1 K/uL   Immature Granulocytes 0 %   Abs Immature Granulocytes 0.03 0.00 - 0.07 K/uL    Comment: Performed at [facility], [address] [name], [place] [address]  CMP (Cancer Center only)     Status: Abnormal   Collection Time: 02/22/24 11:07 AM  Result Value Ref Range   Sodium 135 135 - 145 mmol/L   Potassium 4.9 3.5 - 5.1 mmol/L   Chloride 110 98 - 111 mmol/L   CO2 18 (L) 22 - 32 mmol/L   Glucose, Bld 138 (H) 70 - 99 mg/dL    Comment: Glucose reference range applies only to samples taken after fasting for at least 8 hours.   BUN 67 (H) 8 - 23 mg/dL   Creatinine 5.28 (H) 4.13 -  1.24 mg/dL   Calcium  7.5 (L) 8.9 - 10.3 mg/dL   Total Protein 4.7 (L) 6.5 - 8.1 g/dL   Albumin 2.8 (L) 3.5 - 5.0 g/dL   AST 21 15 - 41 U/L   ALT 24 0 - 44 U/L   Alkaline Phosphatase 114 38 - 126 U/L   Total Bilirubin 0.7 0.0 - 1.2 mg/dL   GFR, Estimated 16 (L) >60 mL/min    Comment: (NOTE) Calculated using the CKD-EPI Creatinine Equation (2021)    Anion gap 7 5 - 15    Comment: Performed at [facility], [address] [place], [place] [address]  Type and screen     Status: None   Collection Time: 02/22/24 11:07 AM  Result Value Ref Range   ABO/RH(D) A POS    Antibody Screen NEG    Sample Expiration 02/25/2024,2359    Unit Number [id number]    Blood Component Type RBC, LR IRR    Unit division 00    Status of Unit ISSUED,FINAL    Transfusion Status OK TO TRANSFUSE    Crossmatch Result Compatible    Unit Number [id number]    Blood Component Type RCLI PHER 2    Unit division 00    Status of Unit ISSUED,FINAL    Transfusion Status OK TO TRANSFUSE    Crossmatch Result      Compatible Performed at [facility], [address] [place], [place] [address]   BPAM RBC     Status: None   Collection Time: 02/22/24 11:07 AM  Result Value Ref Range   ISSUE DATE / TIME [id number]    Blood Product Unit Number [id number]    PRODUCT CODE K0254Y70    Unit Type and Rh 6200    Blood Product Expiration Date 623762831517    ISSUE DATE / TIME [id number]    Blood Product Unit Number [id number]    PRODUCT CODE [id number]  Unit Type and Rh 6200    Blood Product Expiration Date [id number]   Prepare RBC (crossmatch)     Status: None   Collection Time: 02/22/24  3:00 PM  Result Value Ref Range   Order Confirmation      ORDER PROCESSED BY BLOOD BANK Performed at [facility], [address] [place], [place] [address]   CMP (Cancer Center only)     Status: Abnormal   Collection Time: 02/29/24  8:21 AM  Result Value Ref Range   Sodium 135 135 -  145 mmol/L   Potassium 4.9 3.5 - 5.1 mmol/L   Chloride 111 98 - 111 mmol/L   CO2 18 (L) 22 - 32 mmol/L   Glucose, Bld 103 (H) 70 - 99 mg/dL    Comment: Glucose reference range applies only to samples taken after fasting for at least 8 hours.   BUN 77 (H) 8 - 23 mg/dL   Creatinine 9.62 (H) 9.52 - 1.24 mg/dL   Calcium  7.7 (L) 8.9 - 10.3 mg/dL   Total Protein 4.8 (L) 6.5 - 8.1 g/dL   Albumin 2.8 (L) 3.5 - 5.0 g/dL   AST 20 15 - 41 U/L   ALT 25 0 - 44 U/L   Alkaline Phosphatase 127 (H) 38 - 126 U/L   Total Bilirubin 0.9 0.0 - 1.2 mg/dL   GFR, Estimated 16 (L) >60 mL/min    Comment: (NOTE) Calculated using the CKD-EPI Creatinine Equation (2021)    Anion gap 6 5 - 15    Comment: Performed at [facility], [address] [place], [place] [address]  CBC with Differential (Cancer Center Only)     Status: Abnormal   Collection Time: 02/29/24  8:21 AM  Result Value Ref Range   WBC Count 19.1 (H) 4.0 - 10.5 K/uL   RBC 2.34 (L) 4.22 - 5.81 MIL/uL   Hemoglobin 7.5 (L) 13.0 - 17.0 g/dL   HCT 44.0 (L) 10.2 - 72.5 %   MCV 99.6 80.0 - 100.0 fL   MCH 32.1 26.0 - 34.0 pg   MCHC 32.2 30.0 - 36.0 g/dL   RDW 36.6 (H) 44.0 - 34.7 %   Platelet Count 20 (L) 150 - 400 K/uL    Comment: Immature Platelet Fraction may be clinically indicated, consider ordering this additional test [id number]    nRBC 0.0 0.0 - 0.2 %   Neutrophils Relative % 9 %   Neutro Abs 1.7 1.7 - 7.7 K/uL   Lymphocytes Relative 90 %   Lymphs Abs 17.0 (H) 0.7 - 4.0 K/uL   Monocytes Relative 1 %   Monocytes Absolute 0.2 0.1 - 1.0 K/uL   Eosinophils Relative 0 %   Eosinophils Absolute 0.1 0.0 - 0.5 K/uL   Basophils Relative 0 %   Basophils Absolute 0.1 0.0 - 0.1 K/uL   WBC Morphology DIFF. CONFIRMED BY SMEAR    RBC Morphology MORPHOLOGY UNREMARKABLE    Smear Review Normal platelet morphology     Comment: PLATELETS APPEAR DECREASED   Immature Granulocytes 0 %   Abs Immature Granulocytes 0.06 0.00 - 0.07 K/uL    Comment:  Performed at [facility], [address] [place], [place] [address]  Type and screen     Status: None   Collection Time: 02/29/24  8:26 AM  Result Value Ref Range   ABO/RH(D) A POS    Antibody Screen NEG    Sample Expiration 03/03/2024,2359    Unit Number [id number]    Blood  Component Type RBC, LR IRR    Unit division 00    Status of Unit ISSUED,FINAL    Transfusion Status OK TO TRANSFUSE    Crossmatch Result      Compatible Performed at [facility], [address] [place], [place] [address]   BPAM RBC     Status: None   Collection Time: 02/29/24  8:26 AM  Result Value Ref Range   ISSUE DATE / TIME [id number]    Blood Product Unit Number [id number]    PRODUCT CODE [id number]    Unit Type and Rh 6200    Blood Product Expiration Date [id number]   Prepare RBC (crossmatch)     Status: None   Collection Time: 02/29/24  9:21 AM  Result Value Ref Range   Order Confirmation      ORDER PROCESSED BY BLOOD BANK Performed at [facility], [address] [place], [place] [address]   Hold Tube- Blood Bank     Status: None   Collection Time: 03/07/24  9:05 AM  Result Value Ref Range   Blood Bank Specimen SAMPLE AVAILABLE FOR TESTING    Sample Expiration      03/10/2024,2359 Performed at [facility], [address] [place], [place] [address]   CMP (Cancer Center only)     Status: Abnormal   Collection Time: 03/07/24  9:05 AM  Result Value Ref Range   Sodium 135 135 - 145 mmol/L   Potassium 4.9 3.5 - 5.1 mmol/L   Chloride 109 98 - 111 mmol/L   CO2 20 (L) 22 - 32 mmol/L   Glucose, Bld 124 (H) 70 - 99 mg/dL    Comment: Glucose reference range applies only to samples taken after fasting for at least 8 hours.   BUN 73 (H) 8 - 23 mg/dL   Creatinine 0.10 (H) 9.32 - 1.24 mg/dL   Calcium  7.3 (L) 8.9 - 10.3 mg/dL   Total Protein 4.7 (L) 6.5 - 8.1 g/dL   Albumin 2.7 (L) 3.5 - 5.0 g/dL   AST 19 15 - 41 U/L   ALT 23 0 - 44 U/L   Alkaline  Phosphatase 121 38 - 126 U/L   Total Bilirubin 0.7 0.0 - 1.2 mg/dL   GFR, Estimated 16 (L) >60 mL/min    Comment: (NOTE) Calculated using the CKD-EPI Creatinine Equation (2021)    Anion gap 6 5 - 15    Comment: Performed at [facility], [address] [place], [place] [address]  CBC with Differential (Cancer Center Only)     Status: Abnormal   Collection Time: 03/07/24  9:05 AM  Result Value Ref Range   WBC Count 17.5 (H) 4.0 - 10.5 K/uL   RBC 2.18 (L) 4.22 - 5.81 MIL/uL   Hemoglobin 7.0 (L) 13.0 - 17.0 g/dL   HCT 22.0 (L) 25.4 - 27.0 %   MCV 100.5 (H) 80.0 - 100.0 fL   MCH 32.1 26.0 - 34.0 pg   MCHC 32.0 30.0 - 36.0 g/dL   RDW 62.3 (H) 76.2 - 83.1 %   Platelet Count 23 (L) 150 - 400 K/uL    Comment: Immature Platelet Fraction may be clinically indicated, consider ordering this additional test [id number]    nRBC 0.0 0.0 - 0.2 %   Neutrophils Relative % 11 %   Neutro Abs 2.0 1.7 - 7.7 K/uL   Lymphocytes Relative 88 %   Lymphs Abs 15.2 (H) 0.7 - 4.0 K/uL   Monocytes Relative 1 %  Monocytes Absolute 0.2 0.1 - 1.0 K/uL   Eosinophils Relative 0 %   Eosinophils Absolute 0.1 0.0 - 0.5 K/uL   Basophils Relative 0 %   Basophils Absolute 0.0 0.0 - 0.1 K/uL   Immature Granulocytes 0 %   Abs Immature Granulocytes 0.07 0.00 - 0.07 K/uL    Comment: Performed at [facility], [address] [place], [place] [address]  Type and screen     Status: None   Collection Time: 03/07/24  9:05 AM  Result Value Ref Range   ABO/RH(D) A POS    Antibody Screen NEG    Sample Expiration 03/10/2024,2359    Unit Number [id number]    Blood Component Type RCLI PHER 1    Unit division 00    Status of Unit ISSUED,FINAL    Transfusion Status OK TO TRANSFUSE    Crossmatch Result Compatible    Unit Number [id number]    Blood Component Type RBC, LR IRR    Unit division 00    Status of Unit ISSUED,FINAL    Transfusion Status OK TO TRANSFUSE    Crossmatch Result       Compatible Performed at [facility], [address] [place], [place] [address]   BPAM RBC     Status: None   Collection Time: 03/07/24  9:05 AM  Result Value Ref Range   ISSUE DATE / TIME [id number]    Blood Product Unit Number [id number]    PRODUCT CODE [id number]    Unit Type and Rh 6200    Blood Product Expiration Date [id number]    ISSUE DATE / TIME [id number]    Blood Product Unit Number [id number]    PRODUCT CODE K0254Y70    Unit Type and Rh 0600    Blood Product Expiration Date [id number]   Prepare RBC (crossmatch)     Status: None   Collection Time: 03/07/24  2:00 PM  Result Value Ref Range   Order Confirmation      ORDER PROCESSED BY BLOOD BANK Performed at [facility], [address], [place], [place] [address]     Radiology CT ABDOMEN PELVIS WO CONTRAST Result Date: 03/05/2024 EXAMINATION: CT ABDOMEN PELVIS WO CONTRAST CLINICAL INDICATION: Male, 89 years old. Abdominal aortic aneurysm (AAA), follow up TECHNIQUE: Helical CT scan examination of the abdomen and pelvis is performed from the domes of the diaphragm to the pubic symphysis. Limited evaluation of the solid organs due to lack of intravenous contrast. Unless otherwise specified, incidental thyroid, adrenal, renal lesions do not require dedicated imaging follow up. Additionally, any mentioned pulmonary nodules do not require dedicated imaging follow-up based on the Fleischner guidelines unless otherwise specified. Coronary calcifications are not identified unless otherwise specified. COMPARISON: 12/06/2023 FINDINGS: There are minimal atelectatic changes within the lung bases. The heart is at the upper limit of normal for size. There are coronary calcifications. Questionable micronodular hepatic contour. The gallbladder is surgically absent. The spleen remains mildly enlarged. The pancreas is normal. The adrenals are normal. Polycystic right kidney noted. The left kidney contains a few  cysts. There is a similar 5.4 cm infrarenal abdominal aortic aneurysm. Scattered calcified atherosclerotic changes are present. The urinary bladder is normal. The prostate is normal. Small fat-containing right inguinal hernia noted. There is colonic diverticulosis. The appendix is normal. Large and small bowel loops are otherwise within normal limits. Small fat-containing supraumbilical hernia noted. There is no adenopathy. Trace ascites. There is diffuse osseous demineralization with degenerative changes of the spine and bony  pelvis. IMPRESSION: Stable 5.4 cm infrarenal abdominal aortic aneurysm. Recommend follow-up CT in 6 months and referral to a vascular specialist. Stable nonspecific splenomegaly. Trace ascites is slightly improved compared to prior. DOSE REDUCTION: This exam was performed according to our departmental dose-optimization program which includes automated exposure control, adjustment of the mA and/or kV according to patient size and/or use of iterative reconstruction technique. Electronically signed by: Italy Engel MD 03/05/2024 06:21 PM EDT RP Workstation: [id number]    Assessment/Plan  No problem-specific Assessment & Plan notes found for this encounter.  Diabetes mellitus (HCC) blood glucose control important in reducing the progression of atherosclerotic disease. Also, involved in wound healing. On appropriate medications.     BP (high blood pressure) blood pressure control important in reducing the progression of atherosclerotic disease and aneurysmal disease. On appropriate oral medications.     HLD (hyperlipidemia) lipid control important in reducing the progression of atherosclerotic disease. Continue statin therapy  Mikki Alexander, MD  03/12/2024 1:13 PM    This note was created with Dragon medical transcription system.  Any errors from dictation are purely unintentional

## 2024-03-13 NOTE — Assessment & Plan Note (Signed)
 He underwent a recent CT scan of the abdomen and pelvis which I have independently reviewed without contrast.  This demonstrates a stable 5.4 cm infrarenal abdominal aortic aneurysm. This is a complex and difficult situation in a patient with extreme comorbidities.  We have previously decided not to consider repair until it reached at least 5.5 cm in maximal diameter.  He says he is not sure he will ever fix it but does want to continue to follow it at least on 9-month intervals with CT scan.  This is reasonable.

## 2024-03-14 ENCOUNTER — Encounter: Payer: Self-pay | Admitting: Oncology

## 2024-03-14 ENCOUNTER — Other Ambulatory Visit: Payer: Self-pay | Admitting: *Deleted

## 2024-03-14 ENCOUNTER — Inpatient Hospital Stay: Attending: Oncology

## 2024-03-14 ENCOUNTER — Inpatient Hospital Stay (HOSPITAL_BASED_OUTPATIENT_CLINIC_OR_DEPARTMENT_OTHER): Admitting: Oncology

## 2024-03-14 VITALS — BP 133/58 | HR 54 | Temp 97.0°F | Resp 16 | Ht 66.0 in | Wt 192.0 lb

## 2024-03-14 DIAGNOSIS — D696 Thrombocytopenia, unspecified: Secondary | ICD-10-CM

## 2024-03-14 DIAGNOSIS — D631 Anemia in chronic kidney disease: Secondary | ICD-10-CM

## 2024-03-14 DIAGNOSIS — N1832 Chronic kidney disease, stage 3b: Secondary | ICD-10-CM | POA: Diagnosis present

## 2024-03-14 DIAGNOSIS — C9110 Chronic lymphocytic leukemia of B-cell type not having achieved remission: Secondary | ICD-10-CM

## 2024-03-14 DIAGNOSIS — I129 Hypertensive chronic kidney disease with stage 1 through stage 4 chronic kidney disease, or unspecified chronic kidney disease: Secondary | ICD-10-CM | POA: Diagnosis present

## 2024-03-14 LAB — CBC WITH DIFFERENTIAL (CANCER CENTER ONLY)
Abs Immature Granulocytes: 0.1 10*3/uL — ABNORMAL HIGH (ref 0.00–0.07)
Basophils Absolute: 0 10*3/uL (ref 0.0–0.1)
Basophils Relative: 0 %
Eosinophils Absolute: 0.1 10*3/uL (ref 0.0–0.5)
Eosinophils Relative: 1 %
HCT: 25.2 % — ABNORMAL LOW (ref 39.0–52.0)
Hemoglobin: 8 g/dL — ABNORMAL LOW (ref 13.0–17.0)
Immature Granulocytes: 1 %
Lymphocytes Relative: 84 %
Lymphs Abs: 12.9 10*3/uL — ABNORMAL HIGH (ref 0.7–4.0)
MCH: 32 pg (ref 26.0–34.0)
MCHC: 31.7 g/dL (ref 30.0–36.0)
MCV: 100.8 fL — ABNORMAL HIGH (ref 80.0–100.0)
Monocytes Absolute: 0.2 10*3/uL (ref 0.1–1.0)
Monocytes Relative: 1 %
Neutro Abs: 2 10*3/uL (ref 1.7–7.7)
Neutrophils Relative %: 13 %
Platelet Count: 21 10*3/uL — ABNORMAL LOW (ref 150–400)
RBC: 2.5 MIL/uL — ABNORMAL LOW (ref 4.22–5.81)
RDW: 18.2 % — ABNORMAL HIGH (ref 11.5–15.5)
Smear Review: NORMAL
WBC Count: 15.3 10*3/uL — ABNORMAL HIGH (ref 4.0–10.5)
nRBC: 0 % (ref 0.0–0.2)

## 2024-03-14 LAB — CMP (CANCER CENTER ONLY)
ALT: 26 U/L (ref 0–44)
AST: 21 U/L (ref 15–41)
Albumin: 2.8 g/dL — ABNORMAL LOW (ref 3.5–5.0)
Alkaline Phosphatase: 125 U/L (ref 38–126)
Anion gap: 8 (ref 5–15)
BUN: 71 mg/dL — ABNORMAL HIGH (ref 8–23)
CO2: 19 mmol/L — ABNORMAL LOW (ref 22–32)
Calcium: 7.3 mg/dL — ABNORMAL LOW (ref 8.9–10.3)
Chloride: 109 mmol/L (ref 98–111)
Creatinine: 3.74 mg/dL — ABNORMAL HIGH (ref 0.61–1.24)
GFR, Estimated: 15 mL/min — ABNORMAL LOW (ref >60)
Glucose, Bld: 134 mg/dL — ABNORMAL HIGH (ref 70–99)
Potassium: 4.2 mmol/L (ref 3.5–5.1)
Sodium: 136 mmol/L (ref 135–145)
Total Bilirubin: 0.9 mg/dL (ref 0.0–1.2)
Total Protein: 4.8 g/dL — ABNORMAL LOW (ref 6.5–8.1)

## 2024-03-14 LAB — PREPARE RBC (CROSSMATCH)

## 2024-03-14 NOTE — Progress Notes (Signed)
 [facility]  Telephone:([phone] [phone]  ID: Patrick Payne. OB: 1934/08/28  MR#: [id number]  UVO#:[id number]  Patient Care Team: Yehuda Helms, MD as PCP - General (Internal Medicine) Shellie Dials, MD as Consulting Physician (Oncology)  CHIEF COMPLAINT: CLL, anemia/thrombocytopenia.  INTERVAL HISTORY: Patient returns to clinic today for repeat laboratory, further evaluation, and consideration of blood transfusion.  He continues to tolerate 420 mg Imbruvica  without significant side effects.  He has chronic weakness and fatigue.  Continues to have easy bruising.  The edema in his left hand is unchanged.  He has no neurologic complaints today.  He denies any chest pain, shortness of breath, cough, or hemoptysis.  He denies any nausea, vomiting, constipation, or diarrhea. He has no urinary complaints.  Patient offers no further specific complaints today.  REVIEW OF SYSTEMS:   Review of Systems  Constitutional:  Positive for malaise/fatigue. Negative for diaphoresis, fever and weight loss.  Respiratory: Negative.  Negative for cough and shortness of breath.   Cardiovascular: Negative.  Negative for chest pain and leg swelling.  Gastrointestinal: Negative.  Negative for abdominal pain, blood in stool and melena.  Genitourinary: Negative.  Negative for dysuria.  Musculoskeletal: Negative.  Negative for back pain.  Skin: Negative.  Negative for rash.  Neurological:  Positive for weakness. Negative for dizziness, sensory change, focal weakness and headaches.  Endo/Heme/Allergies:  Bruises/bleeds easily.  Psychiatric/Behavioral: Negative.  The patient is not nervous/anxious.     As per HPI. Otherwise, a complete review of systems is negative.  PAST MEDICAL HISTORY: Past Medical History:  Diagnosis Date   Anxiety    CKD (chronic kidney disease), stage III (HCC)    CLL (chronic lymphocytic leukemia) (HCC)    Diabetes mellitus without  complication (HCC)    Diverticulitis 04/09/2017   History of kidney stones    HLD (hyperlipidemia)    HTN (hypertension)    PKD (polycystic kidney disease)    Sleep apnea    Uncontrolled type 2 diabetes mellitus with hyperglycemia (HCC) 06/28/2018    PAST SURGICAL HISTORY: Past Surgical History:  Procedure Laterality Date   CARDIAC CATHETERIZATION  12/1987   CENTRAL LINE INSERTION N/A 04/11/2017   Procedure: Central Line Insertion;  Surgeon: Jackquelyn Mass, MD;  Location: [facility];  Service: Cardiovascular;  Laterality: N/A;   CERVICAL FUSION     CHOLECYSTECTOMY     CYSTOSCOPY W/ RETROGRADES Left 05/12/2017   Procedure: CYSTOSCOPY WITH RETROGRADE PYELOGRAM;  Surgeon: Bart Born, MD;  Location: [facility];  Service: Urology;  Laterality: Left;   CYSTOSCOPY W/ URETERAL STENT PLACEMENT Left 05/12/2017   Procedure: CYSTOSCOPY WITH STENT REMOVAL;  Surgeon: Bart Born, MD;  Location: [facility];  Service: Urology;  Laterality: Left;   CYSTOSCOPY WITH STENT PLACEMENT Left 04/11/2017   Procedure: CYSTOSCOPY WITH STENT PLACEMENT;  Surgeon: Christina Coyer, MD;  Location: [facility];  Service: Urology;  Laterality: Left;   URETEROSCOPY  05/12/2017   Procedure: URETEROSCOPY;  Surgeon: Bart Born, MD;  Location: [facility];  Service: Urology;;    FAMILY HISTORY: Reported history of ovarian and lung cancer. Diabetes, hypertension.     ADVANCED DIRECTIVES:    HEALTH MAINTENANCE: Social History   Tobacco Use   Smoking status: Former    Types: Cigarettes    Passive exposure: Past   Smokeless tobacco: Never   Tobacco comments:    quit in Feb of 1997  Vaping Use   Vaping status: Never Used  Substance Use Topics   Alcohol use: No   Drug use: No     Colonoscopy:  PAP:  Bone density:  Lipid panel:  Allergies  Allergen Reactions   Celecoxib Other (See Comments)    Increased Blood Pressure   Chlorpromazine Nausea Only   Iodinated Contrast Media Other  (See Comments)    Stage 3 kidney disease, told IV dye would be bad for him   Prednisone Other (See Comments)    Diabetic   Amoxicillin-Pot Clavulanate Rash    Has patient had a PCN reaction causing immediate rash, facial/tongue/throat swelling, SOB or lightheadedness with hypotension: No Has patient had a PCN reaction causing severe rash involving mucus membranes or skin necrosis: No Has patient had a PCN reaction that required hospitalization: No Has patient had a PCN reaction occurring within the last 10 years: Yes If all of the above answers are "NO", then may proceed with Cephalosporin use.    Penicillin V Potassium Rash    Current Outpatient Medications  Medication Sig Dispense Refill   Aflibercept 2 MG/0.05ML SOLN Inject 1 Dose into the eye as directed. One injection into left eye every 8-9 weeks     atorvastatin  (LIPITOR) 10 MG tablet Take 10 mg by mouth at bedtime.      BD PEN NEEDLE NANO 2ND GEN 32G X 4 MM MISC USE AS DIRECTED ONCE A DAY     carvedilol  (COREG ) 12.5 MG tablet Take 12.5 mg by mouth 2 (two) times daily with a meal.     Cholecalciferol (VITAMIN D -3) 5000 units TABS Take 5,000 Units by mouth at bedtime.      clonazePAM  (KLONOPIN ) 1 MG tablet Take 1 mg by mouth at bedtime.      CONTOUR NEXT TEST test strip daily.     cyanocobalamin 500 MCG tablet Take 1,000 mcg by mouth at bedtime.      diphenoxylate -atropine  (LOMOTIL ) 2.5-0.025 MG tablet Take 1 tablet by mouth 4 (four) times daily as needed for diarrhea or loose stools. 60 tablet 1   empagliflozin (JARDIANCE) 10 MG TABS tablet 10 mg daily.     epoetin  alfa-epbx (RETACRIT ) 40000 UNIT/ML injection Inject 40,000 Units into the skin every 6 (six) weeks. Depending on lab results (if needed)     finasteride  (PROSCAR ) 5 MG tablet Take 1 tablet (5 mg total) by mouth daily. 90 tablet 3   folic acid  (FOLVITE ) 400 MCG tablet Take 400 mcg by mouth daily.     ibrutinib  (IMBRUVICA ) 420 MG tablet Take 1 tablet (420 mg total) by  mouth daily. Take with a glass of water. 28 tablet 1   insulin  glargine (LANTUS ) 100 UNIT/ML injection Inject 20-30 Units into the skin every morning. Takes Lantus  20 units if CBG less than 120 mg/dl or Lantus  30 units if CBG is over 120 mg/dl     Multiple Vitamins-Minerals (PRESERVISION AREDS 2 PO) Take 1 capsule by mouth 2 (two) times daily.      prednisoLONE acetate (PRED FORTE) 1 % ophthalmic suspension Place 1 drop into the right eye 4 (four) times daily.     sertraline (ZOLOFT) 50 MG tablet Take 100 mg by mouth daily.     tamsulosin  (FLOMAX ) 0.4 MG CAPS capsule TAKE 1 CAPSULE(0.4 MG) BY MOUTH DAILY AFTER SUPPER 90 capsule 3   telmisartan (MICARDIS) 40 MG tablet Take 40 mg by mouth daily.     torsemide (DEMADEX) 10 MG tablet Take by mouth.     triamcinolone (NASACORT) 55 MCG/ACT AERO nasal inhaler  Place 2 sprays into the nose 2 (two) times daily as needed (allergies).      fexofenadine (ALLEGRA) 180 MG tablet Take 180 mg by mouth daily as needed for allergies. (Patient not taking: Reported on 01/25/2024)     No current facility-administered medications for this visit.   Facility-Administered Medications Ordered in Other Visits  Medication Dose Route Frequency Provider Last Rate Last Admin   epoetin  alfa-epbx (RETACRIT ) injection 40,000 Units  40,000 Units Subcutaneous Once Shellie Dials, MD        OBJECTIVE: Vitals:   03/14/24 0908  BP: (!) 133/58  Pulse: (!) 54  Resp: 16  Temp: (!) 97 F (36.1 C)  SpO2: 100%        Body mass index is 30.99 kg/m.    ECOG FS:1 - Symptomatic but completely ambulatory  General: Well-developed, well-nourished, no acute distress.  Sitting in a wheelchair. Eyes: Pink conjunctiva, anicteric sclera. HEENT: Normocephalic, moist mucous membranes. Lungs: No audible wheezing or coughing. Heart: Regular rate and rhythm. Abdomen: Soft, nontender, no obvious distention. Musculoskeletal: Edema of left hand unchanged. Neuro: Alert, answering all  questions appropriately. Cranial nerves grossly intact. Skin: No rashes or petechiae noted.  Ecchymosis noted on upper extremity.   Psych: Normal affect.   LAB RESULTS:  Lab Results  Component Value Date   NA 136 03/14/2024   K 4.2 03/14/2024   CL 109 03/14/2024   CO2 19 (L) 03/14/2024   GLUCOSE 134 (H) 03/14/2024   BUN 71 (H) 03/14/2024   CREATININE 3.74 (H) 03/14/2024   CALCIUM  7.3 (L) 03/14/2024   PROT 4.8 (L) 03/14/2024   ALBUMIN 2.8 (L) 03/14/2024   AST 21 03/14/2024   ALT 26 03/14/2024   ALKPHOS 125 03/14/2024   BILITOT 0.9 03/14/2024   GFRNONAA 15 (L) 03/14/2024   GFRAA 37 (L) 11/19/2019    Lab Results  Component Value Date   WBC 17.5 (H) 03/07/2024   NEUTROABS 2.0 03/07/2024   HGB 7.0 (L) 03/07/2024   HCT 21.9 (L) 03/07/2024   MCV 100.5 (H) 03/07/2024   PLT 23 (L) 03/07/2024   Lab Results  Component Value Date   IRON 79 01/11/2024   TIBC 304 01/11/2024   IRONPCTSAT 26 01/11/2024   Lab Results  Component Value Date   FERRITIN 142 01/11/2024     STUDIES: CT ABDOMEN PELVIS WO CONTRAST Result Date: 03/05/2024 EXAMINATION: CT ABDOMEN PELVIS WO CONTRAST CLINICAL INDICATION: Male, 89 years old. Abdominal aortic aneurysm (AAA), follow up TECHNIQUE: Helical CT scan examination of the abdomen and pelvis is performed from the domes of the diaphragm to the pubic symphysis. Limited evaluation of the solid organs due to lack of intravenous contrast. Unless otherwise specified, incidental thyroid, adrenal, renal lesions do not require dedicated imaging follow up. Additionally, any mentioned pulmonary nodules do not require dedicated imaging follow-up based on the Fleischner guidelines unless otherwise specified. Coronary calcifications are not identified unless otherwise specified. COMPARISON: 12/06/2023 FINDINGS: There are minimal atelectatic changes within the lung bases. The heart is at the upper limit of normal for size. There are coronary calcifications. Questionable  micronodular hepatic contour. The gallbladder is surgically absent. The spleen remains mildly enlarged. The pancreas is normal. The adrenals are normal. Polycystic right kidney noted. The left kidney contains a few cysts. There is a similar 5.4 cm infrarenal abdominal aortic aneurysm. Scattered calcified atherosclerotic changes are present. The urinary bladder is normal. The prostate is normal. Small fat-containing right inguinal hernia noted. There is colonic diverticulosis. The appendix  is normal. Large and small bowel loops are otherwise within normal limits. Small fat-containing supraumbilical hernia noted. There is no adenopathy. Trace ascites. There is diffuse osseous demineralization with degenerative changes of the spine and bony pelvis. IMPRESSION: Stable 5.4 cm infrarenal abdominal aortic aneurysm. Recommend follow-up CT in 6 months and referral to a vascular specialist. Stable nonspecific splenomegaly. Trace ascites is slightly improved compared to prior. DOSE REDUCTION: This exam was performed according to our departmental dose-optimization program which includes automated exposure control, adjustment of the mA and/or kV according to patient size and/or use of iterative reconstruction technique. Electronically signed by: [place] Engel MD 03/05/2024 06:21 PM EDT RP Workstation: [id number]     ASSESSMENT: CLL, anemia/thrombocytopenia.  PLAN:    CLL: Bone marrow biopsy on January 08, 2024 reported 69% clonal B-cell lymphocytes consistent with a diagnosis of CLL.  Patient does not have any peripheral lymphocytosis.  No other pathology was reported.  It is possible patient's chronic thrombocytopenia, worsening anemia, night sweats are related to his CLL, therefore patient was initiated on dose reduced Imbruvica  280 mg daily in early April 2025.  He increased to 420 mg at the end of May 2025 and is tolerating treatment well.  Return to clinic tomorrow for 1 unit of packed red blood cells.  He will then  return to clinic in 1 week for laboratory work and consideration of blood. Anemia, unspecified: Possibly related to progression of CLL, but we do not have a previous bone marrow biopsy for comparison.  Patient's hemoglobin improved to 8.0.  Return to clinic tomorrow for 1 unit packed red blood cells.  All blood products should be irradiated. Thrombocytopenia: Chronic and unchanged.  Patient's platelet count is 21.  Monitor.  Bone marrow biopsy as above.  Previously, patient required multiple transfusions of platelets prior to his oral surgery.  He did not have any excessive bleeding during his surgery.  His count was as high as 95 in June 2016.  He does not require transfusion today.   Lymphadenopathy: Patient's most recent CT scan to evaluate his aortic aneurysm on Mar 05, 2024 did not reveal any obvious lymphadenopathy.   Aortic aneurysm: Appreciate vascular surgery input.  Given patient's poor kidney function, surgical repair is likely not possible.  Patient [facility] vascular surgery will repeat CT scan in 6 months.   Dental surgery: Successful and resolved. Renal insufficiency: Chronic and unchanged.  Patient's most recent GFR is 15.  Continue monitoring and treatment per nephrology.  Imbruvica  does not need to be dose reduced in the setting of renal dysfunction.   Patient expressed understanding and was in agreement with this plan. He also understands that He can call clinic at any time with any questions, concerns, or complaints.    Cancer Staging  Chronic lymphocytic leukemia (HCC) Staging form: Chronic Lymphocytic Leukemia / Small Lymphocytic Lymphoma, AJCC 8th Edition - Clinical stage from 01/12/2024: Modified Rai Stage IV (Modified Rai risk: High, Lymphocytosis: Absent, Adenopathy: Absent, Organomegaly: Absent, Anemia: Present, Thrombocytopenia: Present) - Signed by Shellie Dials, MD on 01/13/2024    Shellie Dials, MD   03/14/2024 9:22 AM

## 2024-03-15 ENCOUNTER — Inpatient Hospital Stay

## 2024-03-15 DIAGNOSIS — C9110 Chronic lymphocytic leukemia of B-cell type not having achieved remission: Secondary | ICD-10-CM | POA: Diagnosis not present

## 2024-03-15 MED ORDER — SODIUM CHLORIDE 0.9% IV SOLUTION
250.0000 mL | INTRAVENOUS | Status: DC
  Administered 2024-03-15: 100 mL via INTRAVENOUS
  Filled 2024-03-15: qty 250

## 2024-03-15 MED ORDER — ACETAMINOPHEN 325 MG PO TABS
650.0000 mg | ORAL_TABLET | Freq: Once | ORAL | Status: AC
  Administered 2024-03-15: 650 mg via ORAL
  Filled 2024-03-15: qty 2

## 2024-03-15 MED ORDER — DIPHENHYDRAMINE HCL 50 MG/ML IJ SOLN
25.0000 mg | Freq: Once | INTRAMUSCULAR | Status: AC
  Administered 2024-03-15: 25 mg via INTRAVENOUS
  Filled 2024-03-15: qty 1

## 2024-03-16 LAB — BPAM RBC
Blood Product Expiration Date: 202506092359
ISSUE DATE / TIME: 202506060855
Unit Type and Rh: 5100

## 2024-03-16 LAB — TYPE AND SCREEN
ABO/RH(D): A POS
Antibody Screen: NEGATIVE
Unit division: 0

## 2024-03-21 ENCOUNTER — Encounter: Payer: Self-pay | Admitting: Oncology

## 2024-03-21 ENCOUNTER — Other Ambulatory Visit: Payer: Self-pay

## 2024-03-21 ENCOUNTER — Inpatient Hospital Stay (HOSPITAL_BASED_OUTPATIENT_CLINIC_OR_DEPARTMENT_OTHER): Admitting: Oncology

## 2024-03-21 ENCOUNTER — Ambulatory Visit: Admitting: Oncology

## 2024-03-21 ENCOUNTER — Encounter (INDEPENDENT_AMBULATORY_CARE_PROVIDER_SITE_OTHER): Payer: Self-pay

## 2024-03-21 ENCOUNTER — Inpatient Hospital Stay

## 2024-03-21 ENCOUNTER — Other Ambulatory Visit

## 2024-03-21 VITALS — BP 138/62 | HR 54 | Temp 96.5°F | Resp 16 | Ht 66.0 in | Wt 190.0 lb

## 2024-03-21 DIAGNOSIS — C9110 Chronic lymphocytic leukemia of B-cell type not having achieved remission: Secondary | ICD-10-CM

## 2024-03-21 DIAGNOSIS — N189 Chronic kidney disease, unspecified: Secondary | ICD-10-CM

## 2024-03-21 DIAGNOSIS — D696 Thrombocytopenia, unspecified: Secondary | ICD-10-CM

## 2024-03-21 LAB — CMP (CANCER CENTER ONLY)
ALT: 24 U/L (ref 0–44)
AST: 19 U/L (ref 15–41)
Albumin: 2.9 g/dL — ABNORMAL LOW (ref 3.5–5.0)
Alkaline Phosphatase: 127 U/L — ABNORMAL HIGH (ref 38–126)
Anion gap: 10 (ref 5–15)
BUN: 68 mg/dL — ABNORMAL HIGH (ref 8–23)
CO2: 20 mmol/L — ABNORMAL LOW (ref 22–32)
Calcium: 7.5 mg/dL — ABNORMAL LOW (ref 8.9–10.3)
Chloride: 109 mmol/L (ref 98–111)
Creatinine: 3.61 mg/dL — ABNORMAL HIGH (ref 0.61–1.24)
GFR, Estimated: 16 mL/min — ABNORMAL LOW (ref >60)
Glucose, Bld: 134 mg/dL — ABNORMAL HIGH (ref 70–99)
Potassium: 4.3 mmol/L (ref 3.5–5.1)
Sodium: 139 mmol/L (ref 135–145)
Total Bilirubin: 0.9 mg/dL (ref 0.0–1.2)
Total Protein: 4.7 g/dL — ABNORMAL LOW (ref 6.5–8.1)

## 2024-03-21 LAB — CBC WITH DIFFERENTIAL (CANCER CENTER ONLY)
Abs Immature Granulocytes: 0.17 10*3/uL — ABNORMAL HIGH (ref 0.00–0.07)
Basophils Absolute: 0 10*3/uL (ref 0.0–0.1)
Basophils Relative: 0 %
Eosinophils Absolute: 0.1 10*3/uL (ref 0.0–0.5)
Eosinophils Relative: 1 %
HCT: 24.5 % — ABNORMAL LOW (ref 39.0–52.0)
Hemoglobin: 7.9 g/dL — ABNORMAL LOW (ref 13.0–17.0)
Immature Granulocytes: 1 %
Lymphocytes Relative: 83 %
Lymphs Abs: 12.9 10*3/uL — ABNORMAL HIGH (ref 0.7–4.0)
MCH: 32.6 pg (ref 26.0–34.0)
MCHC: 32.2 g/dL (ref 30.0–36.0)
MCV: 101.2 fL — ABNORMAL HIGH (ref 80.0–100.0)
Monocytes Absolute: 0.2 10*3/uL (ref 0.1–1.0)
Monocytes Relative: 1 %
Neutro Abs: 2.1 10*3/uL (ref 1.7–7.7)
Neutrophils Relative %: 14 %
Platelet Count: 21 10*3/uL — ABNORMAL LOW (ref 150–400)
RBC: 2.42 MIL/uL — ABNORMAL LOW (ref 4.22–5.81)
RDW: 18.6 % — ABNORMAL HIGH (ref 11.5–15.5)
Smear Review: NORMAL
WBC Count: 15.1 10*3/uL — ABNORMAL HIGH (ref 4.0–10.5)
nRBC: 0 % (ref 0.0–0.2)

## 2024-03-21 LAB — SAMPLE TO BLOOD BANK

## 2024-03-21 NOTE — Progress Notes (Signed)
 Specialty Pharmacy Refill Coordination Note  Patrick Payne. is a 89 y.o. male contacted today regarding refills of specialty medication(s) Ibrutinib  (IMBRUVICA )   Patient requested Delivery   Delivery date: 03/22/24   Verified address: [address] [place], [place] [address]   Medication will be filled on 03/21/24.

## 2024-03-21 NOTE — Progress Notes (Signed)
 [name]  [phone] [phone]  ID: Patrick Bradley Dacanay Jr. OB: 10-19-1933  MR#: [id number]  QIO#:[id number]  Patient Care Team: Yehuda Helms, MD as PCP - General (Internal Medicine) Shellie Dials, MD as Consulting Physician (Oncology)  CHIEF COMPLAINT: CLL, anemia/thrombocytopenia.  INTERVAL HISTORY: Patient returns to clinic today for repeat laboratory work, further evaluation, and consideration of blood transfusion.  He continues to tolerate Imbruvica  well without significant side effects.  He continues to have chronic weakness and fatigue.  Continues to have easy bruising.  The edema in his left hand is unchanged.  He has no neurologic complaints today.  He denies any chest pain, shortness of breath, cough, or hemoptysis.  He denies any nausea, vomiting, constipation, or diarrhea. He has no urinary complaints.  Patient offers no further specific complaints today.  REVIEW OF SYSTEMS:   Review of Systems  Constitutional:  Positive for malaise/fatigue. Negative for diaphoresis, fever and weight loss.  Respiratory: Negative.  Negative for cough and shortness of breath.   Cardiovascular: Negative.  Negative for chest pain and leg swelling.  Gastrointestinal: Negative.  Negative for abdominal pain, blood in stool and melena.  Genitourinary: Negative.  Negative for dysuria.  Musculoskeletal: Negative.  Negative for back pain.  Skin: Negative.  Negative for rash.  Neurological:  Positive for weakness. Negative for dizziness, sensory change, focal weakness and headaches.  Endo/Heme/Allergies:  Bruises/bleeds easily.  Psychiatric/Behavioral: Negative.  The patient is not nervous/anxious.     As per HPI. Otherwise, a complete review of systems is negative.  PAST MEDICAL HISTORY: Past Medical History:  Diagnosis Date   Anxiety    CKD (chronic kidney disease), stage III (HCC)    CLL (chronic lymphocytic leukemia) (HCC)    Diabetes  mellitus without complication (HCC)    Diverticulitis 04/09/2017   History of kidney stones    HLD (hyperlipidemia)    HTN (hypertension)    PKD (polycystic kidney disease)    Sleep apnea    Uncontrolled type 2 diabetes mellitus with hyperglycemia (HCC) 06/28/2018    PAST SURGICAL HISTORY: Past Surgical History:  Procedure Laterality Date   CARDIAC CATHETERIZATION  12/1987   CENTRAL LINE INSERTION N/A 04/11/2017   Procedure: Central Line Insertion;  Surgeon: Jackquelyn Mass, MD;  Location: [facility];  Service: Cardiovascular;  Laterality: N/A;   CERVICAL FUSION     CHOLECYSTECTOMY     CYSTOSCOPY W/ RETROGRADES Left 05/12/2017   Procedure: CYSTOSCOPY WITH RETROGRADE PYELOGRAM;  Surgeon: Bart Born, MD;  Location: [facility] OR[facility];  Service: Urology;  Laterality: Left;   CYSTOSCOPY W/ URETERAL STENT PLACEMENT Left 05/12/2017   Procedure: CYSTOSCOPY WITH STENT REMOVAL;  Surgeon: Bart Born, MD;  Location: [facility];  Service: Urology;  Laterality: Left;   CYSTOSCOPY WITH STENT PLACEMENT Left 04/11/2017   Procedure: CYSTOSCOPY WITH STENT PLACEMENT;  Surgeon: Christina Coyer, MD;  Location: [facility];  Service: Urology;  Laterality: Left;   URETEROSCOPY  05/12/2017   Procedure: URETEROSCOPY;  Surgeon: Bart Born, MD;  Location: [facility];  Service: Urology;;    FAMILY HISTORY: Reported history of ovarian and lung cancer. Diabetes, hypertension.     ADVANCED DIRECTIVES:    HEALTH MAINTENANCE: Social History   Tobacco Use   Smoking status: Former    Types: Cigarettes    Passive exposure: Past   Smokeless tobacco: Never   Tobacco comments:    quit in Feb of 1997  Vaping Use   Vaping status: Never  Used  Substance Use Topics   Alcohol use: No   Drug use: No     Colonoscopy:  PAP:  Bone density:  Lipid panel:  Allergies  Allergen Reactions   Celecoxib Other (See Comments)    Increased Blood Pressure   Chlorpromazine Nausea Only   Iodinated  Contrast Media Other (See Comments)    Stage 3 kidney disease, told IV dye would be bad for him   Prednisone Other (See Comments)    Diabetic   Amoxicillin-Pot Clavulanate Rash    Has patient had a PCN reaction causing immediate rash, facial/tongue/throat swelling, SOB or lightheadedness with hypotension: No Has patient had a PCN reaction causing severe rash involving mucus membranes or skin necrosis: No Has patient had a PCN reaction that required hospitalization: No Has patient had a PCN reaction occurring within the last 10 years: Yes If all of the above answers are NO, then may proceed with Cephalosporin use.    Penicillin V Potassium Rash    Current Outpatient Medications  Medication Sig Dispense Refill   Aflibercept 2 MG/0.05ML SOLN Inject 1 Dose into the eye as directed. One injection into left eye every 8-9 weeks     atorvastatin  (LIPITOR) 10 MG tablet Take 10 mg by mouth at bedtime.      BD PEN NEEDLE NANO 2ND GEN 32G X 4 MM MISC USE AS DIRECTED ONCE A DAY     carvedilol  (COREG ) 12.5 MG tablet Take 12.5 mg by mouth 2 (two) times daily with a meal.     Cholecalciferol (VITAMIN D -3) 5000 units TABS Take 5,000 Units by mouth at bedtime.      clonazePAM  (KLONOPIN ) 1 MG tablet Take 1 mg by mouth at bedtime.      CONTOUR NEXT TEST test strip daily.     cyanocobalamin 500 MCG tablet Take 1,000 mcg by mouth at bedtime.      diphenoxylate -atropine  (LOMOTIL ) 2.5-0.025 MG tablet Take 1 tablet by mouth 4 (four) times daily as needed for diarrhea or loose stools. 60 tablet 1   empagliflozin (JARDIANCE) 10 MG TABS tablet 10 mg daily.     epoetin  alfa-epbx (RETACRIT ) 40000 UNIT/ML injection Inject 40,000 Units into the skin every 6 (six) weeks. Depending on lab results (if needed)     fexofenadine (ALLEGRA) 180 MG tablet Take 180 mg by mouth daily as needed for allergies.     finasteride  (PROSCAR ) 5 MG tablet Take 1 tablet (5 mg total) by mouth daily. 90 tablet 3   folic acid  (FOLVITE ) 400  MCG tablet Take 400 mcg by mouth daily.     ibrutinib  (IMBRUVICA ) 420 MG tablet Take 1 tablet (420 mg total) by mouth daily. Take with a glass of water. 28 tablet 1   insulin  glargine (LANTUS ) 100 UNIT/ML injection Inject 20-30 Units into the skin every morning. Takes Lantus  20 units if CBG less than 120 mg/dl or Lantus  30 units if CBG is over 120 mg/dl     Multiple Vitamins-Minerals (PRESERVISION AREDS 2 PO) Take 1 capsule by mouth 2 (two) times daily.      prednisoLONE acetate (PRED FORTE) 1 % ophthalmic suspension Place 1 drop into the right eye 4 (four) times daily.     sertraline (ZOLOFT) 50 MG tablet Take 100 mg by mouth daily.     tamsulosin  (FLOMAX ) 0.4 MG CAPS capsule TAKE 1 CAPSULE(0.4 MG) BY MOUTH DAILY AFTER SUPPER 90 capsule 3   telmisartan (MICARDIS) 40 MG tablet Take 40 mg by mouth daily.  torsemide (DEMADEX) 10 MG tablet Take by mouth.     triamcinolone (NASACORT) 55 MCG/ACT AERO nasal inhaler Place 2 sprays into the nose 2 (two) times daily as needed (allergies).      No current facility-administered medications for this visit.   Facility-Administered Medications Ordered in Other Visits  Medication Dose Route Frequency Provider Last Rate Last Admin   epoetin  alfa-epbx (RETACRIT ) injection 40,000 Units  40,000 Units Subcutaneous Once Andera Cranmer J, MD        OBJECTIVE: Vitals:   03/21/24 1041  BP: 138/62  Pulse: (!) 54  Resp: 16  Temp: (!) 96.5 F (35.8 C)  SpO2: 100%        Body mass index is 30.67 kg/m.    ECOG FS:1 - Symptomatic but completely ambulatory  General: Well-developed, well-nourished, no acute distress.  Sitting in a wheelchair. Eyes: Pink conjunctiva, anicteric sclera. HEENT: Normocephalic, moist mucous membranes. Lungs: No audible wheezing or coughing. Heart: Regular rate and rhythm. Abdomen: Soft, nontender, no obvious distention. Musculoskeletal: No edema, cyanosis, or clubbing. Neuro: Alert, answering all questions appropriately.  Cranial nerves grossly intact. Skin: No rashes or petechiae noted. Psych: Normal affect.  LAB RESULTS:  Lab Results  Component Value Date   NA 139 03/21/2024   K 4.3 03/21/2024   CL 109 03/21/2024   CO2 20 (L) 03/21/2024   GLUCOSE 134 (H) 03/21/2024   BUN 68 (H) 03/21/2024   CREATININE 3.61 (H) 03/21/2024   CALCIUM  7.5 (L) 03/21/2024   PROT 4.7 (L) 03/21/2024   ALBUMIN 2.9 (L) 03/21/2024   AST 19 03/21/2024   ALT 24 03/21/2024   ALKPHOS 127 (H) 03/21/2024   BILITOT 0.9 03/21/2024   GFRNONAA 16 (L) 03/21/2024   GFRAA 37 (L) 11/19/2019    Lab Results  Component Value Date   WBC 15.1 (H) 03/21/2024   NEUTROABS 2.1 03/21/2024   HGB 7.9 (L) 03/21/2024   HCT 24.5 (L) 03/21/2024   MCV 101.2 (H) 03/21/2024   PLT 21 (L) 03/21/2024   Lab Results  Component Value Date   IRON 79 01/11/2024   TIBC 304 01/11/2024   IRONPCTSAT 26 01/11/2024   Lab Results  Component Value Date   FERRITIN 142 01/11/2024     STUDIES: CT ABDOMEN PELVIS WO CONTRAST Result Date: 03/05/2024 EXAMINATION: CT ABDOMEN PELVIS WO CONTRAST CLINICAL INDICATION: Male, 89 years old. Abdominal aortic aneurysm (AAA), follow up TECHNIQUE: Helical CT scan examination of the abdomen and pelvis is performed from the domes of the diaphragm to the pubic symphysis. Limited evaluation of the solid organs due to lack of intravenous contrast. Unless otherwise specified, incidental thyroid, adrenal, renal lesions do not require dedicated imaging follow up. Additionally, any mentioned pulmonary nodules do not require dedicated imaging follow-up based on the Fleischner guidelines unless otherwise specified. Coronary calcifications are not identified unless otherwise specified. COMPARISON: 12/06/2023 FINDINGS: There are minimal atelectatic changes within the lung bases. The heart is at the upper limit of normal for size. There are coronary calcifications. Questionable micronodular hepatic contour. The gallbladder is surgically  absent. The spleen remains mildly enlarged. The pancreas is normal. The adrenals are normal. Polycystic right kidney noted. The left kidney contains a few cysts. There is a similar 5.4 cm infrarenal abdominal aortic aneurysm. Scattered calcified atherosclerotic changes are present. The urinary bladder is normal. The prostate is normal. Small fat-containing right inguinal hernia noted. There is colonic diverticulosis. The appendix is normal. Large and small bowel loops are otherwise within normal limits. Small fat-containing supraumbilical  hernia noted. There is no adenopathy. Trace ascites. There is diffuse osseous demineralization with degenerative changes of the spine and bony pelvis. IMPRESSION: Stable 5.4 cm infrarenal abdominal aortic aneurysm. Recommend follow-up CT in 6 months and referral to a vascular specialist. Stable nonspecific splenomegaly. Trace ascites is slightly improved compared to prior. DOSE REDUCTION: This exam was performed according to our departmental dose-optimization program which includes automated exposure control, adjustment of the mA and/or kV according to patient size and/or use of iterative reconstruction technique. Electronically signed by: [place] Engel MD 03/05/2024 06:21 PM EDT RP Workstation: [id number]     ASSESSMENT: CLL, anemia/thrombocytopenia.  PLAN:    CLL: Bone marrow biopsy on January 08, 2024 reported 69% clonal B-cell lymphocytes consistent with a diagnosis of CLL.  Patient does not have any peripheral lymphocytosis.  No other pathology was reported.  It is possible patient's chronic thrombocytopenia, worsening anemia, night sweats are related to his CLL, therefore patient was initiated on dose reduced Imbruvica  280 mg daily in early April 2025.  He increased to 420 mg at the end of May 2025 and is tolerating treatment well.  Return to clinic tomorrow for 1 unit of packed red blood cells.  He will then return to clinic in 1 week for laboratory work and  consideration of blood. Anemia, unspecified: Possibly related to progression of CLL, but we do not have a previous bone marrow biopsy for comparison.  Hemoglobin essentially stable at 7.9 despite 1 unit of packed red blood cells last week.  Proceed with 1 unit tomorrow.  All blood products should be irradiated. Thrombocytopenia: Chronic and unchanged.  Patient's platelet count is 21.  Monitor.  Bone marrow biopsy as above.  Previously, patient required multiple transfusions of platelets prior to his oral surgery.  He did not have any excessive bleeding during his surgery.  His count was as high as 95 in June 2016.  He does not require transfusion today.   Lymphadenopathy: Patient's most recent CT scan to evaluate his aortic aneurysm on Mar 05, 2024 did not reveal any obvious lymphadenopathy.   Aortic aneurysm: Appreciate vascular surgery input.  Given patient's poor kidney function, surgical repair is likely not possible.  Patient reports vascular surgery will repeat CT scan in 6 months.   Dental surgery: Successful and resolved. Renal insufficiency: Chronic and unchanged.  Patient's most recent GFR is 16.  Continue monitoring and treatment per nephrology.  Imbruvica  does not need to be dose reduced in the setting of renal dysfunction.   Patient expressed understanding and was in agreement with this plan. He also understands that He can call clinic at any time with any questions, concerns, or complaints.    Cancer Staging  Chronic lymphocytic leukemia (HCC) Staging form: Chronic Lymphocytic Leukemia / Small Lymphocytic Lymphoma, AJCC 8th Edition - Clinical stage from 01/12/2024: Modified Rai Stage IV (Modified Rai risk: High, Lymphocytosis: Absent, Adenopathy: Absent, Organomegaly: Absent, Anemia: Present, Thrombocytopenia: Present) - Signed by Shellie Dials, MD on 01/13/2024    Shellie Dials, MD   03/21/2024 11:29 AM

## 2024-03-22 ENCOUNTER — Inpatient Hospital Stay

## 2024-03-22 ENCOUNTER — Ambulatory Visit

## 2024-03-22 DIAGNOSIS — C9110 Chronic lymphocytic leukemia of B-cell type not having achieved remission: Secondary | ICD-10-CM

## 2024-03-22 MED ORDER — ACETAMINOPHEN 325 MG PO TABS
650.0000 mg | ORAL_TABLET | Freq: Once | ORAL | Status: AC
  Administered 2024-03-22: 650 mg via ORAL
  Filled 2024-03-22: qty 2

## 2024-03-22 MED ORDER — SODIUM CHLORIDE 0.9% IV SOLUTION
250.0000 mL | INTRAVENOUS | Status: DC
  Administered 2024-03-22: 250 mL via INTRAVENOUS
  Filled 2024-03-22: qty 250

## 2024-03-22 MED ORDER — DIPHENHYDRAMINE HCL 50 MG/ML IJ SOLN
25.0000 mg | Freq: Once | INTRAMUSCULAR | Status: AC
  Administered 2024-03-22: 25 mg via INTRAVENOUS
  Filled 2024-03-22: qty 1

## 2024-03-22 NOTE — Patient Instructions (Signed)

## 2024-03-23 LAB — BPAM RBC
Blood Product Expiration Date: 202507042359
ISSUE DATE / TIME: 202506131150
Unit Type and Rh: 5100

## 2024-03-23 LAB — TYPE AND SCREEN
ABO/RH(D): A POS
Antibody Screen: NEGATIVE
Unit division: 0

## 2024-03-23 LAB — PREPARE RBC (CROSSMATCH)

## 2024-03-28 ENCOUNTER — Inpatient Hospital Stay

## 2024-03-28 ENCOUNTER — Inpatient Hospital Stay: Admitting: Oncology

## 2024-03-28 ENCOUNTER — Telehealth: Payer: Self-pay | Admitting: *Deleted

## 2024-03-28 NOTE — Telephone Encounter (Signed)
 The patient called and said he needs to cancel the lab appointment the appointment with [facility] and the blood transfusion because he and his wife have a bug  and he and she cannot come out this week The patient would like to just to move the lab Dr. And then the next day for transfusion for Thursday or Friday next week if possible.  He says that he has MyChart so you can leave the MyChart to give him the next appointments

## 2024-03-29 ENCOUNTER — Inpatient Hospital Stay

## 2024-04-04 ENCOUNTER — Inpatient Hospital Stay (HOSPITAL_BASED_OUTPATIENT_CLINIC_OR_DEPARTMENT_OTHER): Admitting: Oncology

## 2024-04-04 ENCOUNTER — Encounter: Payer: Self-pay | Admitting: Oncology

## 2024-04-04 ENCOUNTER — Inpatient Hospital Stay

## 2024-04-04 VITALS — BP 126/54 | HR 57 | Temp 98.6°F | Resp 20 | Wt 193.1 lb

## 2024-04-04 DIAGNOSIS — N189 Chronic kidney disease, unspecified: Secondary | ICD-10-CM

## 2024-04-04 DIAGNOSIS — C9110 Chronic lymphocytic leukemia of B-cell type not having achieved remission: Secondary | ICD-10-CM

## 2024-04-04 DIAGNOSIS — D696 Thrombocytopenia, unspecified: Secondary | ICD-10-CM

## 2024-04-04 LAB — PREPARE RBC (CROSSMATCH)

## 2024-04-04 LAB — CMP (CANCER CENTER ONLY)
ALT: 22 U/L (ref 0–44)
AST: 19 U/L (ref 15–41)
Albumin: 2.6 g/dL — ABNORMAL LOW (ref 3.5–5.0)
Alkaline Phosphatase: 125 U/L (ref 38–126)
Anion gap: 8 (ref 5–15)
BUN: 67 mg/dL — ABNORMAL HIGH (ref 8–23)
CO2: 20 mmol/L — ABNORMAL LOW (ref 22–32)
Calcium: 7.6 mg/dL — ABNORMAL LOW (ref 8.9–10.3)
Chloride: 107 mmol/L (ref 98–111)
Creatinine: 3.83 mg/dL — ABNORMAL HIGH (ref 0.61–1.24)
GFR, Estimated: 15 mL/min — ABNORMAL LOW (ref >60)
Glucose, Bld: 134 mg/dL — ABNORMAL HIGH (ref 70–99)
Potassium: 4.6 mmol/L (ref 3.5–5.1)
Sodium: 135 mmol/L (ref 135–145)
Total Bilirubin: 0.9 mg/dL (ref 0.0–1.2)
Total Protein: 4.7 g/dL — ABNORMAL LOW (ref 6.5–8.1)

## 2024-04-04 LAB — CBC WITH DIFFERENTIAL (CANCER CENTER ONLY)
Abs Immature Granulocytes: 0.04 10*3/uL (ref 0.00–0.07)
Basophils Absolute: 0 10*3/uL (ref 0.0–0.1)
Basophils Relative: 0 %
Eosinophils Absolute: 0.1 10*3/uL (ref 0.0–0.5)
Eosinophils Relative: 0 %
HCT: 22.3 % — ABNORMAL LOW (ref 39.0–52.0)
Hemoglobin: 7.2 g/dL — ABNORMAL LOW (ref 13.0–17.0)
Immature Granulocytes: 0 %
Lymphocytes Relative: 85 %
Lymphs Abs: 12.3 10*3/uL — ABNORMAL HIGH (ref 0.7–4.0)
MCH: 33 pg (ref 26.0–34.0)
MCHC: 32.3 g/dL (ref 30.0–36.0)
MCV: 102.3 fL — ABNORMAL HIGH (ref 80.0–100.0)
Monocytes Absolute: 0.2 10*3/uL (ref 0.1–1.0)
Monocytes Relative: 1 %
Neutro Abs: 2 10*3/uL (ref 1.7–7.7)
Neutrophils Relative %: 14 %
Platelet Count: 25 10*3/uL — ABNORMAL LOW (ref 150–400)
RBC: 2.18 MIL/uL — ABNORMAL LOW (ref 4.22–5.81)
RDW: 19.1 % — ABNORMAL HIGH (ref 11.5–15.5)
Smear Review: NORMAL
WBC Count: 14.6 10*3/uL — ABNORMAL HIGH (ref 4.0–10.5)
nRBC: 0 % (ref 0.0–0.2)

## 2024-04-04 LAB — TYPE AND SCREEN: Antibody Screen: NEGATIVE

## 2024-04-04 NOTE — Progress Notes (Signed)
 [facility]  [phone] [phone]  ID: Patrick PARAS Myles Jr. OB: 01/26/1934  MR#: [id number]  RDW#:[id number]  Patient Care Team: Auston Reyes BIRCH, MD as PCP - General (Internal Medicine) [name], MD as Consulting Physician (Oncology)  CHIEF COMPLAINT: CLL, anemia/thrombocytopenia.  INTERVAL HISTORY: Patient returns to clinic today for repeat laboratory, further evaluation, consideration of blood transfusion.  He missed last week's appointment secondary to a viral GI illness.  He feels improved and is back to his baseline.  He continues to tolerate 420 mg Imbruvica  daily without significant side effects.  He continues to have chronic weakness and fatigue.  Continues to have easy bruising.  The edema in his bilateral hands is unchanged.  He has no neurologic complaints today.  He denies any chest pain, shortness of breath, cough, or hemoptysis.  He denies any nausea, vomiting, constipation, or diarrhea. He has no urinary complaints.  Patient offers no further specific complaints today.  REVIEW OF SYSTEMS:   Review of Systems  Constitutional:  Positive for malaise/fatigue. Negative for diaphoresis, fever and weight loss.  Respiratory: Negative.  Negative for cough and shortness of breath.   Cardiovascular: Negative.  Negative for chest pain and leg swelling.  Gastrointestinal: Negative.  Negative for abdominal pain, blood in stool and melena.  Genitourinary: Negative.  Negative for dysuria.  Musculoskeletal: Negative.  Negative for back pain.  Skin: Negative.  Negative for rash.  Neurological:  Positive for weakness. Negative for dizziness, sensory change, focal weakness and headaches.  Endo/Heme/Allergies:  Bruises/bleeds easily.  Psychiatric/Behavioral: Negative.  The patient is not nervous/anxious.     As per HPI. Otherwise, a complete review of systems is negative.  PAST MEDICAL HISTORY: Past Medical History:  Diagnosis Date    Anxiety    CKD (chronic kidney disease), stage III (HCC)    CLL (chronic lymphocytic leukemia) (HCC)    Diabetes mellitus without complication (HCC)    Diverticulitis 04/09/2017   History of kidney stones    HLD (hyperlipidemia)    HTN (hypertension)    PKD (polycystic kidney disease)    Sleep apnea    Uncontrolled type 2 diabetes mellitus with hyperglycemia (HCC) 06/28/2018    PAST SURGICAL HISTORY: Past Surgical History:  Procedure Laterality Date   CARDIAC CATHETERIZATION  12/1987   CENTRAL LINE INSERTION N/A 04/11/2017   Procedure: Central Line Insertion;  Surgeon: Jama Cordella MATSU, MD;  Location: [facility];  Service: Cardiovascular;  Laterality: N/A;   CERVICAL FUSION     CHOLECYSTECTOMY     CYSTOSCOPY W/ RETROGRADES Left 05/12/2017   Procedure: CYSTOSCOPY WITH RETROGRADE PYELOGRAM;  Surgeon: Chauncey Redell Agent, MD;  Location: [facility];  Service: Urology;  Laterality: Left;   CYSTOSCOPY W/ URETERAL STENT PLACEMENT Left 05/12/2017   Procedure: CYSTOSCOPY WITH STENT REMOVAL;  Surgeon: Chauncey Redell Agent, MD;  Location: [facility];  Service: Urology;  Laterality: Left;   CYSTOSCOPY WITH STENT PLACEMENT Left 04/11/2017   Procedure: CYSTOSCOPY WITH STENT PLACEMENT;  Surgeon: Nieves Cough, MD;  Location: [facility];  Service: Urology;  Laterality: Left;   URETEROSCOPY  05/12/2017   Procedure: URETEROSCOPY;  Surgeon: Chauncey Redell Agent, MD;  Location: [facility];  Service: Urology;;    FAMILY HISTORY: Reported history of ovarian and lung cancer. Diabetes, hypertension.     ADVANCED DIRECTIVES:    HEALTH MAINTENANCE: Social History   Tobacco Use   Smoking status: Former    Types: Cigarettes    Passive exposure: Past   Smokeless  tobacco: Never   Tobacco comments:    quit in Feb of 1997  Vaping Use   Vaping status: Never Used  Substance Use Topics   Alcohol use: No   Drug use: No     Colonoscopy:  PAP:  Bone density:  Lipid panel:  Allergies  Allergen  Reactions   Celecoxib Other (See Comments)    Increased Blood Pressure   Chlorpromazine Nausea Only   Iodinated Contrast Media Other (See Comments)    Stage 3 kidney disease, told IV dye would be bad for him   Prednisone Other (See Comments)    Diabetic   Amoxicillin-Pot Clavulanate Rash    Has patient had a PCN reaction causing immediate rash, facial/tongue/throat swelling, SOB or lightheadedness with hypotension: No Has patient had a PCN reaction causing severe rash involving mucus membranes or skin necrosis: No Has patient had a PCN reaction that required hospitalization: No Has patient had a PCN reaction occurring within the last 10 years: Yes If all of the above answers are NO, then may proceed with Cephalosporin use.    Penicillin V Potassium Rash    Current Outpatient Medications  Medication Sig Dispense Refill   Aflibercept 2 MG/0.05ML SOLN Inject 1 Dose into the eye as directed. One injection into left eye every 8-9 weeks     atorvastatin  (LIPITOR) 10 MG tablet Take 10 mg by mouth at bedtime.      BD PEN NEEDLE NANO 2ND GEN 32G X 4 MM MISC USE AS DIRECTED ONCE A DAY     carvedilol  (COREG ) 12.5 MG tablet Take 12.5 mg by mouth 2 (two) times daily with a meal.     Cholecalciferol (VITAMIN D -3) 5000 units TABS Take 5,000 Units by mouth at bedtime.      clonazePAM  (KLONOPIN ) 1 MG tablet Take 1 mg by mouth at bedtime.      cyanocobalamin 500 MCG tablet Take 1,000 mcg by mouth at bedtime.      diphenoxylate -atropine  (LOMOTIL ) 2.5-0.025 MG tablet Take 1 tablet by mouth 4 (four) times daily as needed for diarrhea or loose stools. 60 tablet 1   empagliflozin (JARDIANCE) 10 MG TABS tablet 10 mg daily.     epoetin  alfa-epbx (RETACRIT ) 40000 UNIT/ML injection Inject 40,000 Units into the skin every 6 (six) weeks. Depending on lab results (if needed)     fexofenadine (ALLEGRA) 180 MG tablet Take 180 mg by mouth daily as needed for allergies.     finasteride  (PROSCAR ) 5 MG tablet Take 1  tablet (5 mg total) by mouth daily. 90 tablet 3   folic acid  (FOLVITE ) 400 MCG tablet Take 400 mcg by mouth daily.     ibrutinib  (IMBRUVICA ) 420 MG tablet Take 1 tablet (420 mg total) by mouth daily. Take with a glass of water. 28 tablet 1   insulin  glargine (LANTUS ) 100 UNIT/ML injection Inject 20-30 Units into the skin every morning. Takes Lantus  20 units if CBG less than 120 mg/dl or Lantus  30 units if CBG is over 120 mg/dl     Multiple Vitamins-Minerals (PRESERVISION AREDS 2 PO) Take 1 capsule by mouth 2 (two) times daily.      prednisoLONE acetate (PRED FORTE) 1 % ophthalmic suspension Place 1 drop into the right eye 4 (four) times daily.     sertraline (ZOLOFT) 50 MG tablet Take 100 mg by mouth daily.     tamsulosin  (FLOMAX ) 0.4 MG CAPS capsule TAKE 1 CAPSULE(0.4 MG) BY MOUTH DAILY AFTER SUPPER 90 capsule 3   telmisartan (  MICARDIS) 40 MG tablet Take 40 mg by mouth daily.     torsemide (DEMADEX) 10 MG tablet Take by mouth.     triamcinolone (NASACORT) 55 MCG/ACT AERO nasal inhaler Place 2 sprays into the nose 2 (two) times daily as needed (allergies).      CONTOUR NEXT TEST test strip daily.     No current facility-administered medications for this visit.   Facility-Administered Medications Ordered in Other Visits  Medication Dose Route Frequency Provider Last Rate Last Admin   epoetin  alfa-epbx (RETACRIT ) injection 40,000 Units  40,000 Units Subcutaneous Once Runa Whittingham J, MD        OBJECTIVE: Vitals:   04/04/24 1015  BP: (!) 126/54  Pulse: (!) 57  Resp: 20  Temp: 98.6 F (37 C)  SpO2: 100%        Body mass index is 31.17 kg/m.    ECOG FS:1 - Symptomatic but completely ambulatory  General: Well-developed, well-nourished, no acute distress. Eyes: Pink conjunctiva, anicteric sclera. HEENT: Normocephalic, moist mucous membranes. Lungs: No audible wheezing or coughing. Heart: Regular rate and rhythm. Abdomen: Soft, nontender, no obvious distention. Musculoskeletal:  No edema, cyanosis, or clubbing. Neuro: Alert, answering all questions appropriately. Cranial nerves grossly intact. Skin: No rashes or petechiae noted. Psych: Normal affect.  LAB RESULTS:  Lab Results  Component Value Date   NA 135 04/04/2024   K 4.6 04/04/2024   CL 107 04/04/2024   CO2 20 (L) 04/04/2024   GLUCOSE 134 (H) 04/04/2024   BUN 67 (H) 04/04/2024   CREATININE 3.83 (H) 04/04/2024   CALCIUM  7.6 (L) 04/04/2024   PROT 4.7 (L) 04/04/2024   ALBUMIN 2.6 (L) 04/04/2024   AST 19 04/04/2024   ALT 22 04/04/2024   ALKPHOS 125 04/04/2024   BILITOT 0.9 04/04/2024   GFRNONAA 15 (L) 04/04/2024   GFRAA 37 (L) 11/19/2019    Lab Results  Component Value Date   WBC 14.6 (H) 04/04/2024   NEUTROABS 2.0 04/04/2024   HGB 7.2 (L) 04/04/2024   HCT 22.3 (L) 04/04/2024   MCV 102.3 (H) 04/04/2024   PLT 25 (L) 04/04/2024   Lab Results  Component Value Date   IRON 79 01/11/2024   TIBC 304 01/11/2024   IRONPCTSAT 26 01/11/2024   Lab Results  Component Value Date   FERRITIN 142 01/11/2024     STUDIES: CT ABDOMEN PELVIS WO CONTRAST Result Date: 03/05/2024 EXAMINATION: CT ABDOMEN PELVIS WO CONTRAST CLINICAL INDICATION: Male, 89 years old. Abdominal aortic aneurysm (AAA), follow up TECHNIQUE: Helical CT scan examination of the abdomen and pelvis is performed from the domes of the diaphragm to the pubic symphysis. Limited evaluation of the solid organs due to lack of intravenous contrast. Unless otherwise specified, incidental thyroid, adrenal, renal lesions do not require dedicated imaging follow up. Additionally, any mentioned pulmonary nodules do not require dedicated imaging follow-up based on the Fleischner guidelines unless otherwise specified. Coronary calcifications are not identified unless otherwise specified. COMPARISON: 12/06/2023 FINDINGS: There are minimal atelectatic changes within the lung bases. The heart is at the upper limit of normal for size. There are coronary  calcifications. Questionable micronodular hepatic contour. The gallbladder is surgically absent. The spleen remains mildly enlarged. The pancreas is normal. The adrenals are normal. Polycystic right kidney noted. The left kidney contains a few cysts. There is a similar 5.4 cm infrarenal abdominal aortic aneurysm. Scattered calcified atherosclerotic changes are present. The urinary bladder is normal. The prostate is normal. Small fat-containing right inguinal hernia noted. There is colonic  diverticulosis. The appendix is normal. Large and small bowel loops are otherwise within normal limits. Small fat-containing supraumbilical hernia noted. There is no adenopathy. Trace ascites. There is diffuse osseous demineralization with degenerative changes of the spine and bony pelvis. IMPRESSION: Stable 5.4 cm infrarenal abdominal aortic aneurysm. Recommend follow-up CT in 6 months and referral to a vascular specialist. Stable nonspecific splenomegaly. Trace ascites is slightly improved compared to prior. DOSE REDUCTION: This exam was performed according to our departmental dose-optimization program which includes automated exposure control, adjustment of the mA and/or kV according to patient size and/or use of iterative reconstruction technique. Electronically signed by: [place] Engel MD 03/05/2024 06:21 PM EDT RP Workstation: [id number]     ASSESSMENT: CLL, anemia/thrombocytopenia.  PLAN:    CLL: Bone marrow biopsy on January 08, 2024 reported 69% clonal B-cell lymphocytes consistent with a diagnosis of CLL.  Patient does not have any peripheral lymphocytosis.  No other pathology was reported.  It is possible patient's chronic thrombocytopenia, worsening anemia, night sweats are related to his CLL, therefore patient was initiated on dose reduced Imbruvica  280 mg daily in early April 2025.  He increased to 420 mg at the end of May 2025 and is tolerating treatment well.  Patient's white blood cell count has trended down to  14.6.  Return to clinic tomorrow for 1 unit packed red blood cells.  Patient will then return to clinic in 2 weeks for further evaluation and continuation of treatment. Anemia, unspecified: Possibly related to progression of CLL, but we do not have a previous bone marrow biopsy for comparison.  Hemoglobin decreased to 7.2, but patient has not had a transfusion in 2 weeks.  Return to clinic tomorrow for 1 unit of packed red blood cells.  All blood products should be irradiated. Thrombocytopenia: Chronic and unchanged.  Patient's platelet count is 25 today.  Monitor.  Bone marrow biopsy as above.  Previously, patient required multiple transfusions of platelets prior to his oral surgery.  He did not have any excessive bleeding during his surgery.  His count was as high as 95 in June 2016.  He does not require transfusion today.   Lymphadenopathy: Patient's most recent CT scan to evaluate his aortic aneurysm on Mar 05, 2024 did not reveal any obvious lymphadenopathy.   Aortic aneurysm: Appreciate vascular surgery input.  Given patient's poor kidney function, surgical repair is likely not possible.  Patient reports vascular surgery will repeat CT scan in 6 months.   Dental surgery: Successful and resolved. Renal insufficiency: Chronic and unchanged.  Patient's most recent GFR is 15.  Continue monitoring and treatment per nephrology.  Imbruvica  does not need to be dose reduced in the setting of renal dysfunction.   Patient expressed understanding and was in agreement with this plan. He also understands that He can call clinic at any time with any questions, concerns, or complaints.    Cancer Staging  Chronic lymphocytic leukemia (HCC) Staging form: Chronic Lymphocytic Leukemia / Small Lymphocytic Lymphoma, AJCC 8th Edition - Clinical stage from 01/12/2024: Modified Rai Stage IV (Modified Rai risk: High, Lymphocytosis: Absent, Adenopathy: Absent, Organomegaly: Absent, Anemia: Present, Thrombocytopenia: Present)  - Signed by [name], MD on 01/13/2024    Evalene PARAS Jacobo, MD   04/04/2024 10:41 AM

## 2024-04-05 ENCOUNTER — Inpatient Hospital Stay

## 2024-04-05 DIAGNOSIS — C9110 Chronic lymphocytic leukemia of B-cell type not having achieved remission: Secondary | ICD-10-CM | POA: Diagnosis not present

## 2024-04-05 MED ORDER — ACETAMINOPHEN 325 MG PO TABS
650.0000 mg | ORAL_TABLET | Freq: Once | ORAL | Status: AC
  Administered 2024-04-05: 650 mg via ORAL
  Filled 2024-04-05: qty 2

## 2024-04-05 MED ORDER — DIPHENHYDRAMINE HCL 50 MG/ML IJ SOLN
25.0000 mg | Freq: Once | INTRAMUSCULAR | Status: AC
  Administered 2024-04-05: 25 mg via INTRAVENOUS
  Filled 2024-04-05: qty 1

## 2024-04-05 MED ORDER — SODIUM CHLORIDE 0.9% IV SOLUTION
250.0000 mL | INTRAVENOUS | Status: DC
  Administered 2024-04-05: 100 mL via INTRAVENOUS
  Filled 2024-04-05: qty 250

## 2024-04-05 NOTE — Patient Instructions (Signed)
 Blood Transfusion, Adult A blood transfusion is a procedure in which you receive blood through an IV tube. You may need this procedure because of: A bleeding disorder. An illness. An injury. A surgery. The blood may come from someone else (a donor). You may also be able to donate blood for yourself before a surgery. The blood given in a transfusion may be made up of different types of cells. You may get: Red blood cells. These carry oxygen to the cells in the body. Platelets. These help your blood to clot. Plasma. This is the liquid part of your blood. It carries proteins and other substances through the body. White blood cells. These help you fight infections. If you have a clotting disorder, you may also get other types of blood products. Depending on the type of blood product, this procedure may take 1-4 hours to complete. Tell your doctor about: Any bleeding problems you have. Any reactions you have had during a blood transfusion in the past. Any allergies you have. All medicines you are taking, including vitamins, herbs, eye drops, creams, and over-the-counter medicines. Any surgeries you have had. Any medical conditions you have. Whether you are pregnant or may be pregnant. What are the risks? Talk with your health care provider about risks. The most common problems include: A mild allergic reaction. This includes red, swollen areas of skin (hives) and itching. Fever or chills. This may be the body's response to new blood cells received. This may happen during or up to 4 hours after the transfusion. More serious problems may include: A serious allergic reaction. This includes breathing trouble or swelling around the face and lips. Too much fluid in the lungs. This may cause breathing problems. Lung injury. This causes breathing trouble and low oxygen in the blood. This can happen within hours of the transfusion or days later. Too much iron. This can happen after getting many blood  transfusions over a period of time. An infection or virus passed through the blood. This is rare. Donated blood is carefully tested before it is given. Your body's defense system (immune system) trying to attack the new blood cells. This is rare. Symptoms may include fever, chills, nausea, low blood pressure, and low back or chest pain. Donated cells attacking healthy tissues. This is rare. What happens before the procedure? You will have a blood test to find out your blood type. The test also finds out what type of blood your body will accept and matches it to the donor type. If you are going to have a planned surgery, you may be able to donate your own blood. This may be done in case you need a transfusion. You will have your temperature, blood pressure, and pulse checked. You may receive medicine to help prevent an allergic reaction. This may be done if you have had a reaction to a transfusion before. This medicine may be given to you by mouth or through an IV tube. What happens during the procedure?  An IV tube will be put into one of your veins. The bag of blood will be attached to your IV tube. Then, the blood will enter through your vein. Your temperature, blood pressure, and pulse will be checked often. This is done to find early signs of a transfusion reaction. Tell your nurse right away if you have any of these symptoms: Shortness of breath or trouble breathing. Chest or back pain. Fever or chills. Red, swollen areas of skin or itching. If you have any signs  or symptoms of a reaction, your transfusion will be stopped. You may also be given medicine. When the transfusion is finished, your IV tube will be taken out. Pressure may be put on the IV site for a few minutes. A bandage (dressing) will be put on the IV site. The procedure may vary among doctors and hospitals. What happens after the procedure? You will be monitored until you leave the hospital or clinic. This includes  checking your temperature, blood pressure, pulse, breathing rate, and blood oxygen level. Your blood may be tested to see how you have responded to the transfusion. You may be warmed with fluids or blankets. This is done to keep the temperature of your body normal. If you have your procedure in an outpatient setting, you will be told whom to contact to report any reactions. Where to find more information Visit the [employer]: redcross.org Summary A blood transfusion is a procedure in which you receive blood through an IV tube. The blood you are given may be made up of different blood cells. You may receive red blood cells, platelets, plasma, or white blood cells. Your temperature, blood pressure, and pulse will be checked often. After the procedure, your blood may be tested to see how you have responded. This information is not intended to replace advice given to you by your health care provider. Make sure you discuss any questions you have with your health care provider. Document Revised: 12/24/2021 Document Reviewed: 12/24/2021 Elsevier Patient Education  2024 [employer].

## 2024-04-06 LAB — TYPE AND SCREEN
ABO/RH(D): A POS
Unit division: 0

## 2024-04-06 LAB — BPAM RBC
Blood Product Expiration Date: 202507172359
ISSUE DATE / TIME: 202506270934
Unit Type and Rh: 600

## 2024-04-08 ENCOUNTER — Encounter (INDEPENDENT_AMBULATORY_CARE_PROVIDER_SITE_OTHER): Payer: Self-pay

## 2024-04-09 ENCOUNTER — Other Ambulatory Visit: Payer: Self-pay

## 2024-04-09 ENCOUNTER — Other Ambulatory Visit: Payer: Self-pay | Admitting: Oncology

## 2024-04-09 DIAGNOSIS — C9110 Chronic lymphocytic leukemia of B-cell type not having achieved remission: Secondary | ICD-10-CM

## 2024-04-09 MED ORDER — IBRUTINIB 420 MG PO TABS
420.0000 mg | ORAL_TABLET | Freq: Every day | ORAL | 1 refills | Status: DC
Start: 2024-04-09 — End: 2024-06-11
  Filled 2024-04-09: qty 28, 28d supply, fill #0
  Filled 2024-05-06: qty 28, 28d supply, fill #1

## 2024-04-09 NOTE — Progress Notes (Signed)
 Specialty Pharmacy Refill Coordination Note  Patrick Payne. is a 89 y.o. male contacted today regarding refills of specialty medication(s) Ibrutinib  (IMBRUVICA )   Patient requested (Patient-Rptd) Delivery   Delivery date: 04/11/24   Verified address: (Patient-Rptd) [address] [place], [place]  [address]   Medication will be filled on 04/10/24.   Sent RR. Based on questionnaire agreement, ship 7/2. Per patient responses has 20 doses if claim too early or refills not obtained in time alert patient of reschedule.

## 2024-04-10 ENCOUNTER — Other Ambulatory Visit: Payer: Self-pay

## 2024-04-18 ENCOUNTER — Other Ambulatory Visit: Payer: Self-pay | Admitting: *Deleted

## 2024-04-18 ENCOUNTER — Inpatient Hospital Stay: Attending: Oncology

## 2024-04-18 ENCOUNTER — Inpatient Hospital Stay (HOSPITAL_BASED_OUTPATIENT_CLINIC_OR_DEPARTMENT_OTHER): Admitting: Oncology

## 2024-04-18 ENCOUNTER — Encounter: Payer: Self-pay | Admitting: Oncology

## 2024-04-18 VITALS — BP 128/55 | HR 55 | Temp 97.6°F | Resp 19 | Wt 192.4 lb

## 2024-04-18 DIAGNOSIS — Z87891 Personal history of nicotine dependence: Secondary | ICD-10-CM | POA: Insufficient documentation

## 2024-04-18 DIAGNOSIS — D649 Anemia, unspecified: Secondary | ICD-10-CM | POA: Diagnosis not present

## 2024-04-18 DIAGNOSIS — N183 Chronic kidney disease, stage 3 unspecified: Secondary | ICD-10-CM | POA: Insufficient documentation

## 2024-04-18 DIAGNOSIS — D696 Thrombocytopenia, unspecified: Secondary | ICD-10-CM | POA: Insufficient documentation

## 2024-04-18 DIAGNOSIS — D631 Anemia in chronic kidney disease: Secondary | ICD-10-CM

## 2024-04-18 DIAGNOSIS — I129 Hypertensive chronic kidney disease with stage 1 through stage 4 chronic kidney disease, or unspecified chronic kidney disease: Secondary | ICD-10-CM | POA: Insufficient documentation

## 2024-04-18 DIAGNOSIS — C9110 Chronic lymphocytic leukemia of B-cell type not having achieved remission: Secondary | ICD-10-CM | POA: Diagnosis not present

## 2024-04-18 DIAGNOSIS — N1832 Chronic kidney disease, stage 3b: Secondary | ICD-10-CM | POA: Diagnosis present

## 2024-04-18 LAB — CMP (CANCER CENTER ONLY)
ALT: 20 U/L (ref 0–44)
AST: 16 U/L (ref 15–41)
Albumin: 2.9 g/dL — ABNORMAL LOW (ref 3.5–5.0)
Alkaline Phosphatase: 122 U/L (ref 38–126)
Anion gap: 7 (ref 5–15)
BUN: 68 mg/dL — ABNORMAL HIGH (ref 8–23)
CO2: 20 mmol/L — ABNORMAL LOW (ref 22–32)
Calcium: 7.6 mg/dL — ABNORMAL LOW (ref 8.9–10.3)
Chloride: 108 mmol/L (ref 98–111)
Creatinine: 4.21 mg/dL — ABNORMAL HIGH (ref 0.61–1.24)
GFR, Estimated: 13 mL/min — ABNORMAL LOW (ref >60)
Glucose, Bld: 151 mg/dL — ABNORMAL HIGH (ref 70–99)
Potassium: 4.4 mmol/L (ref 3.5–5.1)
Sodium: 135 mmol/L (ref 135–145)
Total Bilirubin: 0.9 mg/dL (ref 0.0–1.2)
Total Protein: 4.5 g/dL — ABNORMAL LOW (ref 6.5–8.1)

## 2024-04-18 LAB — CBC WITH DIFFERENTIAL (CANCER CENTER ONLY)
Abs Immature Granulocytes: 0.03 K/uL (ref 0.00–0.07)
Basophils Absolute: 0 K/uL (ref 0.0–0.1)
Basophils Relative: 0 %
Eosinophils Absolute: 0.1 K/uL (ref 0.0–0.5)
Eosinophils Relative: 1 %
HCT: 21.5 % — ABNORMAL LOW (ref 39.0–52.0)
Hemoglobin: 6.8 g/dL — CL (ref 13.0–17.0)
Immature Granulocytes: 0 %
Lymphocytes Relative: 89 %
Lymphs Abs: 11.5 K/uL — ABNORMAL HIGH (ref 0.7–4.0)
MCH: 33 pg (ref 26.0–34.0)
MCHC: 31.6 g/dL (ref 30.0–36.0)
MCV: 104.4 fL — ABNORMAL HIGH (ref 80.0–100.0)
Monocytes Absolute: 0.2 K/uL (ref 0.1–1.0)
Monocytes Relative: 2 %
Neutro Abs: 1.1 K/uL — ABNORMAL LOW (ref 1.7–7.7)
Neutrophils Relative %: 8 %
Platelet Count: 24 K/uL — ABNORMAL LOW (ref 150–400)
RBC: 2.06 MIL/uL — ABNORMAL LOW (ref 4.22–5.81)
RDW: 19.2 % — ABNORMAL HIGH (ref 11.5–15.5)
Smear Review: NORMAL
WBC Count: 12.9 K/uL — ABNORMAL HIGH (ref 4.0–10.5)
nRBC: 0 % (ref 0.0–0.2)

## 2024-04-18 LAB — PREPARE RBC (CROSSMATCH)

## 2024-04-18 NOTE — Progress Notes (Signed)
 [facility]  Telephone:([phone] [phone]  ID: Patrick PARAS Boffa Jr. OB: [birth year]  MR#: [id number]  RDW#:[id number]  Patient Care Team: Auston Reyes BIRCH, MD as PCP - General (Internal Medicine) [name], MD as Consulting Physician (Oncology)  CHIEF COMPLAINT: CLL, anemia/thrombocytopenia.  INTERVAL HISTORY: Patient returns to clinic today for repeat laboratory work, further evaluation, and consideration of additional blood.  He continues to have chronic weakness and fatigue.  He has easy bruising.  He has bilateral arm swelling that is unchanged. He continues to tolerate 420 mg Imbruvica  daily without significant side effects. He has no neurologic complaints today.  He denies any chest pain, shortness of breath, cough, or hemoptysis.  He denies any nausea, vomiting, constipation, or diarrhea. He has no urinary complaints.  Patient offers no further specific complaints today.  REVIEW OF SYSTEMS:   Review of Systems  Constitutional:  Positive for malaise/fatigue. Negative for diaphoresis, fever and weight loss.  Respiratory: Negative.  Negative for cough and shortness of breath.   Cardiovascular: Negative.  Negative for chest pain and leg swelling.  Gastrointestinal: Negative.  Negative for abdominal pain, blood in stool and melena.  Genitourinary: Negative.  Negative for dysuria.  Musculoskeletal: Negative.  Negative for back pain.  Skin: Negative.  Negative for rash.  Neurological:  Positive for weakness. Negative for dizziness, sensory change, focal weakness and headaches.  Endo/Heme/Allergies:  Bruises/bleeds easily.  Psychiatric/Behavioral: Negative.  The patient is not nervous/anxious.     As per HPI. Otherwise, a complete review of systems is negative.  PAST MEDICAL HISTORY: Past Medical History:  Diagnosis Date   Anxiety    CKD (chronic kidney disease), stage III (HCC)    CLL (chronic lymphocytic leukemia) (HCC)    Diabetes  mellitus without complication (HCC)    Diverticulitis 04/09/2017   History of kidney stones    HLD (hyperlipidemia)    HTN (hypertension)    PKD (polycystic kidney disease)    Sleep apnea    Uncontrolled type 2 diabetes mellitus with hyperglycemia (HCC) 06/28/2018    PAST SURGICAL HISTORY: Past Surgical History:  Procedure Laterality Date   CARDIAC CATHETERIZATION  12/1987   CENTRAL LINE INSERTION N/A 04/11/2017   Procedure: Central Line Insertion;  Surgeon: Jama Cordella MATSU, MD;  Location: [facility];  Service: Cardiovascular;  Laterality: N/A;   CERVICAL FUSION     CHOLECYSTECTOMY     CYSTOSCOPY W/ RETROGRADES Left 05/12/2017   Procedure: CYSTOSCOPY WITH RETROGRADE PYELOGRAM;  Surgeon: Chauncey Redell Agent, MD;  Location: [facility] OR[facility];  Service: Urology;  Laterality: Left;   CYSTOSCOPY W/ URETERAL STENT PLACEMENT Left 05/12/2017   Procedure: CYSTOSCOPY WITH STENT REMOVAL;  Surgeon: Chauncey Redell Agent, MD;  Location: [facility] OR[facility];  Service: Urology;  Laterality: Left;   CYSTOSCOPY WITH STENT PLACEMENT Left 04/11/2017   Procedure: CYSTOSCOPY WITH STENT PLACEMENT;  Surgeon: Nieves Cough, MD;  Location: [facility];  Service: Urology;  Laterality: Left;   URETEROSCOPY  05/12/2017   Procedure: URETEROSCOPY;  Surgeon: Chauncey Redell Agent, MD;  Location: [facility];  Service: Urology;;    FAMILY HISTORY: Reported history of ovarian and lung cancer. Diabetes, hypertension.     ADVANCED DIRECTIVES:    HEALTH MAINTENANCE: Social History   Tobacco Use   Smoking status: Former    Types: Cigarettes    Passive exposure: Past   Smokeless tobacco: Never   Tobacco comments:    quit in Feb of 1997  Vaping Use   Vaping status: Never Used  Substance Use Topics   Alcohol use: No   Drug use: No     Colonoscopy:  PAP:  Bone density:  Lipid panel:  Allergies  Allergen Reactions   Celecoxib Other (See Comments)    Increased Blood Pressure   Chlorpromazine Nausea Only   Iodinated  Contrast Media Other (See Comments)    Stage 3 kidney disease, told IV dye would be bad for him   Prednisone Other (See Comments)    Diabetic   Amoxicillin-Pot Clavulanate Rash    Has patient had a PCN reaction causing immediate rash, facial/tongue/throat swelling, SOB or lightheadedness with hypotension: No Has patient had a PCN reaction causing severe rash involving mucus membranes or skin necrosis: No Has patient had a PCN reaction that required hospitalization: No Has patient had a PCN reaction occurring within the last 10 years: Yes If all of the above answers are NO, then may proceed with Cephalosporin use.    Penicillin V Potassium Rash    Current Outpatient Medications  Medication Sig Dispense Refill   Aflibercept 2 MG/0.05ML SOLN Inject 1 Dose into the eye as directed. One injection into left eye every 8-9 weeks     atorvastatin  (LIPITOR) 10 MG tablet Take 10 mg by mouth at bedtime.      BD PEN NEEDLE NANO 2ND GEN 32G X 4 MM MISC USE AS DIRECTED ONCE A DAY     carvedilol  (COREG ) 12.5 MG tablet Take 12.5 mg by mouth 2 (two) times daily with a meal.     Cholecalciferol (VITAMIN D -3) 5000 units TABS Take 5,000 Units by mouth at bedtime.      clonazePAM  (KLONOPIN ) 1 MG tablet Take 1 mg by mouth at bedtime.      cyanocobalamin 500 MCG tablet Take 1,000 mcg by mouth at bedtime.      diphenoxylate -atropine  (LOMOTIL ) 2.5-0.025 MG tablet Take 1 tablet by mouth 4 (four) times daily as needed for diarrhea or loose stools. 60 tablet 1   empagliflozin (JARDIANCE) 10 MG TABS tablet 10 mg daily.     epoetin  alfa-epbx (RETACRIT ) 40000 UNIT/ML injection Inject 40,000 Units into the skin every 6 (six) weeks. Depending on lab results (if needed)     fexofenadine (ALLEGRA) 180 MG tablet Take 180 mg by mouth daily as needed for allergies.     finasteride  (PROSCAR ) 5 MG tablet Take 1 tablet (5 mg total) by mouth daily. 90 tablet 3   folic acid  (FOLVITE ) 400 MCG tablet Take 400 mcg by mouth daily.      ibrutinib  (IMBRUVICA ) 420 MG tablet Take 1 tablet (420 mg total) by mouth daily. Take with a glass of water. 28 tablet 1   insulin  glargine (LANTUS ) 100 UNIT/ML injection Inject 20-30 Units into the skin every morning. Takes Lantus  20 units if CBG less than 120 mg/dl or Lantus  30 units if CBG is over 120 mg/dl     Multiple Vitamins-Minerals (PRESERVISION AREDS 2 PO) Take 1 capsule by mouth 2 (two) times daily.      prednisoLONE acetate (PRED FORTE) 1 % ophthalmic suspension Place 1 drop into the right eye 4 (four) times daily.     sertraline (ZOLOFT) 50 MG tablet Take 100 mg by mouth daily.     tamsulosin  (FLOMAX ) 0.4 MG CAPS capsule TAKE 1 CAPSULE(0.4 MG) BY MOUTH DAILY AFTER SUPPER 90 capsule 3   telmisartan (MICARDIS) 40 MG tablet Take 40 mg by mouth daily.     torsemide (DEMADEX) 10 MG tablet Take by mouth.  triamcinolone (NASACORT) 55 MCG/ACT AERO nasal inhaler Place 2 sprays into the nose 2 (two) times daily as needed (allergies).      CONTOUR NEXT TEST test strip daily.     No current facility-administered medications for this visit.   Facility-Administered Medications Ordered in Other Visits  Medication Dose Route Frequency Provider Last Rate Last Admin   epoetin  alfa-epbx (RETACRIT ) injection 40,000 Units  40,000 Units Subcutaneous Once [name], MD        OBJECTIVE: Vitals:   04/18/24 1119  BP: (!) 128/55  Pulse: (!) 55  Resp: 19  Temp: 97.6 F (36.4 C)  SpO2: 98%       Body mass index is 31.05 kg/m.    ECOG FS:1 - Symptomatic but completely ambulatory  General: Well-developed, well-nourished, no acute distress. Eyes: Pink conjunctiva, anicteric sclera. HEENT: Normocephalic, moist mucous membranes. Lungs: No audible wheezing or coughing. Heart: Regular rate and rhythm. Abdomen: Soft, nontender, no obvious distention. Musculoskeletal: Bilateral upper extremities with 1-2+ edema. Neuro: Alert, answering all questions appropriately. Cranial nerves  grossly intact. Skin: No rashes or petechiae noted. Psych: Normal affect.   LAB RESULTS:  Lab Results  Component Value Date   NA 135 04/18/2024   K 4.4 04/18/2024   CL 108 04/18/2024   CO2 20 (L) 04/18/2024   GLUCOSE 151 (H) 04/18/2024   BUN 68 (H) 04/18/2024   CREATININE 4.21 (H) 04/18/2024   CALCIUM  7.6 (L) 04/18/2024   PROT 4.5 (L) 04/18/2024   ALBUMIN 2.9 (L) 04/18/2024   AST 16 04/18/2024   ALT 20 04/18/2024   ALKPHOS 122 04/18/2024   BILITOT 0.9 04/18/2024   GFRNONAA 13 (L) 04/18/2024   GFRAA 37 (L) 11/19/2019    Lab Results  Component Value Date   WBC 12.9 (H) 04/18/2024   NEUTROABS 1.1 (L) 04/18/2024   HGB 6.8 (LL) 04/18/2024   HCT 21.5 (L) 04/18/2024   MCV 104.4 (H) 04/18/2024   PLT 24 (L) 04/18/2024   Lab Results  Component Value Date   IRON 79 01/11/2024   TIBC 304 01/11/2024   IRONPCTSAT 26 01/11/2024   Lab Results  Component Value Date   FERRITIN 142 01/11/2024     STUDIES: No results found.  ASSESSMENT: CLL, anemia/thrombocytopenia.  PLAN:    CLL: Bone marrow biopsy on January 08, 2024 reported 69% clonal B-cell lymphocytes consistent with a diagnosis of CLL.  Patient does not have any peripheral lymphocytosis.  No other pathology was reported.  It is possible patient's chronic thrombocytopenia, worsening anemia, night sweats are related to his CLL, therefore patient was initiated on dose reduced Imbruvica  280 mg daily in early April 2025.  He increased to 420 mg at the end of May 2025 and is tolerating treatment well.  Patient's total white blood cell count continues to trend down and is now 12.9.  Return to clinic tomorrow for 1 unit of packed red blood cells.  Patient will then return to clinic in 2 weeks for further evaluation and continuation of treatment.   Anemia, unspecified: Possibly related to progression of CLL, but we do not have a previous bone marrow biopsy for comparison.  Hemoglobin trended down to 6.8.  After discussion with the  patient, he only wishes to have 1 unit of packed red blood cells.  Return to clinic tomorrow for treatment.  All blood products should be irradiated. Thrombocytopenia: Chronic and unchanged.  Patient's platelet count is 24 today.  Monitor.  Bone marrow biopsy as above.  Previously, patient  required multiple transfusions of platelets prior to his oral surgery.  He did not have any excessive bleeding during his surgery.  His count was as high as 95 in June 2016.  He does not require transfusion today.   Lymphadenopathy: Patient's most recent CT scan to evaluate his aortic aneurysm on Mar 05, 2024 did not reveal any obvious lymphadenopathy.   Aortic aneurysm: Appreciate vascular surgery input.  Given patient's poor kidney function, surgical repair is likely not possible.  Patient reports vascular surgery will repeat CT scan in 6 months.   Dental surgery: Successful and resolved. Renal insufficiency: Patient's GFR seems to be trending down and is now 13.  Continue monitoring and treatment per nephrology.  Imbruvica  does not need to be dose reduced in the setting of renal dysfunction. Edema: Possibly related to worsening renal insufficiency.   Patient expressed understanding and was in agreement with this plan. He also understands that He can call clinic at any time with any questions, concerns, or complaints.    Cancer Staging  Chronic lymphocytic leukemia (HCC) Staging form: Chronic Lymphocytic Leukemia / Small Lymphocytic Lymphoma, AJCC 8th Edition - Clinical stage from 01/12/2024: Modified Rai Stage IV (Modified Rai risk: High, Lymphocytosis: Absent, Adenopathy: Absent, Organomegaly: Absent, Anemia: Present, Thrombocytopenia: Present) - Signed by [name], MD on 01/13/2024    Evalene PARAS Jacobo, MD   04/19/2024 4:32 PM

## 2024-04-18 NOTE — Progress Notes (Signed)
 Patient states when he get scraps and cuts they don't seem to want to stop bleeding. He state its not clotting, its part of my CLL I guess but I would like md to know about it.

## 2024-04-19 ENCOUNTER — Encounter: Payer: Self-pay | Admitting: Oncology

## 2024-04-19 ENCOUNTER — Inpatient Hospital Stay

## 2024-04-19 DIAGNOSIS — C9110 Chronic lymphocytic leukemia of B-cell type not having achieved remission: Secondary | ICD-10-CM | POA: Diagnosis not present

## 2024-04-19 MED ORDER — DIPHENHYDRAMINE HCL 50 MG/ML IJ SOLN
25.0000 mg | Freq: Once | INTRAMUSCULAR | Status: AC
  Administered 2024-04-19: 25 mg via INTRAVENOUS
  Filled 2024-04-19: qty 1

## 2024-04-19 MED ORDER — ACETAMINOPHEN 325 MG PO TABS
650.0000 mg | ORAL_TABLET | Freq: Once | ORAL | Status: AC
  Administered 2024-04-19: 650 mg via ORAL
  Filled 2024-04-19: qty 2

## 2024-04-19 MED ORDER — SODIUM CHLORIDE 0.9% IV SOLUTION
250.0000 mL | INTRAVENOUS | Status: DC
  Administered 2024-04-19: 100 mL via INTRAVENOUS
  Filled 2024-04-19: qty 250

## 2024-04-20 LAB — TYPE AND SCREEN
ABO/RH(D): A POS
Antibody Screen: NEGATIVE
Unit division: 0

## 2024-04-20 LAB — BPAM RBC
Blood Product Expiration Date: 202508062359
ISSUE DATE / TIME: 202507110921
Unit Type and Rh: 6200

## 2024-04-23 ENCOUNTER — Other Ambulatory Visit (HOSPITAL_COMMUNITY): Payer: Self-pay

## 2024-04-23 NOTE — Progress Notes (Signed)
 Specialty Pharmacy Ongoing Clinical Assessment Note  Patrick Payne. is a 89 y.o. male who is being followed by the specialty pharmacy service for RxSp Oncology   Patient's specialty medication(s) reviewed today: Ibrutinib  (IMBRUVICA )   Missed doses in the last 4 weeks: 0   Patient/Caregiver did not have any additional questions or concerns.   Therapeutic benefit summary: Unable to assess   Adverse events/side effects summary: No adverse events/side effects   Patient's therapy is appropriate to: Continue    Goals Addressed             This Visit's Progress    Achieve or maintain remission   No change    Patient is initiating therapy. Patient will maintain adherence         Follow up: 3 months  Silvano LOISE Dolly Specialty Pharmacist

## 2024-05-01 ENCOUNTER — Other Ambulatory Visit: Payer: Self-pay

## 2024-05-01 ENCOUNTER — Inpatient Hospital Stay

## 2024-05-01 ENCOUNTER — Inpatient Hospital Stay (HOSPITAL_BASED_OUTPATIENT_CLINIC_OR_DEPARTMENT_OTHER): Admitting: Nurse Practitioner

## 2024-05-01 ENCOUNTER — Encounter: Payer: Self-pay | Admitting: Nurse Practitioner

## 2024-05-01 VITALS — BP 148/71 | HR 54 | Resp 18 | Ht 66.0 in | Wt 193.0 lb

## 2024-05-01 DIAGNOSIS — I890 Lymphedema, not elsewhere classified: Secondary | ICD-10-CM

## 2024-05-01 DIAGNOSIS — D696 Thrombocytopenia, unspecified: Secondary | ICD-10-CM

## 2024-05-01 DIAGNOSIS — C9110 Chronic lymphocytic leukemia of B-cell type not having achieved remission: Secondary | ICD-10-CM

## 2024-05-01 DIAGNOSIS — D631 Anemia in chronic kidney disease: Secondary | ICD-10-CM

## 2024-05-01 DIAGNOSIS — D649 Anemia, unspecified: Secondary | ICD-10-CM | POA: Diagnosis not present

## 2024-05-01 LAB — CBC WITH DIFFERENTIAL (CANCER CENTER ONLY)
Abs Immature Granulocytes: 0.02 K/uL (ref 0.00–0.07)
Basophils Absolute: 0 K/uL (ref 0.0–0.1)
Basophils Relative: 0 %
Eosinophils Absolute: 0.1 K/uL (ref 0.0–0.5)
Eosinophils Relative: 1 %
HCT: 19.8 % — ABNORMAL LOW (ref 39.0–52.0)
Hemoglobin: 6.3 g/dL — CL (ref 13.0–17.0)
Immature Granulocytes: 0 %
Lymphocytes Relative: 85 %
Lymphs Abs: 7.6 K/uL — ABNORMAL HIGH (ref 0.7–4.0)
MCH: 33.9 pg (ref 26.0–34.0)
MCHC: 31.8 g/dL (ref 30.0–36.0)
MCV: 106.5 fL — ABNORMAL HIGH (ref 80.0–100.0)
Monocytes Absolute: 0.2 K/uL (ref 0.1–1.0)
Monocytes Relative: 3 %
Neutro Abs: 0.9 K/uL — ABNORMAL LOW (ref 1.7–7.7)
Neutrophils Relative %: 11 %
Platelet Count: 20 K/uL — ABNORMAL LOW (ref 150–400)
RBC: 1.86 MIL/uL — ABNORMAL LOW (ref 4.22–5.81)
RDW: 18.4 % — ABNORMAL HIGH (ref 11.5–15.5)
Smear Review: NORMAL
WBC Count: 8.9 K/uL (ref 4.0–10.5)
nRBC: 0 % (ref 0.0–0.2)

## 2024-05-01 LAB — CMP (CANCER CENTER ONLY)
ALT: 19 U/L (ref 0–44)
AST: 15 U/L (ref 15–41)
Albumin: 3 g/dL — ABNORMAL LOW (ref 3.5–5.0)
Alkaline Phosphatase: 122 U/L (ref 38–126)
Anion gap: 4 — ABNORMAL LOW (ref 5–15)
BUN: 65 mg/dL — ABNORMAL HIGH (ref 8–23)
CO2: 20 mmol/L — ABNORMAL LOW (ref 22–32)
Calcium: 7.6 mg/dL — ABNORMAL LOW (ref 8.9–10.3)
Chloride: 110 mmol/L (ref 98–111)
Creatinine: 3.84 mg/dL — ABNORMAL HIGH (ref 0.61–1.24)
GFR, Estimated: 14 mL/min — ABNORMAL LOW (ref >60)
Glucose, Bld: 123 mg/dL — ABNORMAL HIGH (ref 70–99)
Potassium: 4.8 mmol/L (ref 3.5–5.1)
Sodium: 134 mmol/L — ABNORMAL LOW (ref 135–145)
Total Bilirubin: 0.8 mg/dL (ref 0.0–1.2)
Total Protein: 4.8 g/dL — ABNORMAL LOW (ref 6.5–8.1)

## 2024-05-01 LAB — PREPARE RBC (CROSSMATCH)

## 2024-05-01 NOTE — Progress Notes (Signed)
 [facility]  Telephone:([phone] [phone]  ID: Patrick PARAS Cales Jr. OB: 16-Feb-1934  MR#: [id number]  RDW#:[id number]  Patient Care Team: Auston Reyes BIRCH, MD as PCP - General (Internal Medicine) [name], MD as Consulting Physician (Oncology)  CHIEF COMPLAINT: CLL, anemia/thrombocytopenia  INTERVAL HISTORY: Patrick Payne. Is a 89 y.o. male who returns to clinic for follow up and consideration of additional blood transfusion. He feels weak and tired. Suspects he needs blood. He complains of arm swelling, bilateral which has been present for greater than a year. Improves at night bot bothersome, limiting activity due to feeling tight. He takes diuretics but hasn't noticed improvement. He continues to tolerate 420 mg Imbruvica  daily without significant side effects. He has no neurologic complaints today.  He denies any chest pain, shortness of breath, cough, or hemoptysis.  He denies any nausea, vomiting, constipation, or diarrhea. He has no urinary complaints.  Patient offers no further specific complaints today.  REVIEW OF SYSTEMS:   Review of Systems  Constitutional:  Positive for malaise/fatigue. Negative for diaphoresis, fever and weight loss.  Respiratory: Negative.  Negative for cough and shortness of breath.   Cardiovascular:  Positive for leg swelling. Negative for chest pain.  Gastrointestinal: Negative.  Negative for abdominal pain, blood in stool and melena.  Genitourinary: Negative.  Negative for dysuria.  Musculoskeletal: Negative.  Negative for back pain.  Skin: Negative.  Negative for rash.  Neurological:  Positive for weakness. Negative for dizziness, sensory change, focal weakness and headaches.  Endo/Heme/Allergies:  Bruises/bleeds easily.  Psychiatric/Behavioral: Negative.  The patient is not nervous/anxious.   As per HPI. Otherwise, a complete review of systems is negative.   PAST MEDICAL HISTORY: Past Medical  History:  Diagnosis Date   Anxiety    CKD (chronic kidney disease), stage III (HCC)    CLL (chronic lymphocytic leukemia) (HCC)    Diabetes mellitus without complication (HCC)    Diverticulitis 04/09/2017   History of kidney stones    HLD (hyperlipidemia)    HTN (hypertension)    PKD (polycystic kidney disease)    Sleep apnea    Uncontrolled type 2 diabetes mellitus with hyperglycemia (HCC) 06/28/2018    PAST SURGICAL HISTORY: Past Surgical History:  Procedure Laterality Date   CARDIAC CATHETERIZATION  12/1987   CENTRAL LINE INSERTION N/A 04/11/2017   Procedure: Central Line Insertion;  Surgeon: Jama Cordella MATSU, MD;  Location: [facility];  Service: Cardiovascular;  Laterality: N/A;   CERVICAL FUSION     CHOLECYSTECTOMY     CYSTOSCOPY W/ RETROGRADES Left 05/12/2017   Procedure: CYSTOSCOPY WITH RETROGRADE PYELOGRAM;  Surgeon: Chauncey Redell Agent, MD;  Location: [facility];  Service: Urology;  Laterality: Left;   CYSTOSCOPY W/ URETERAL STENT PLACEMENT Left 05/12/2017   Procedure: CYSTOSCOPY WITH STENT REMOVAL;  Surgeon: Chauncey Redell Agent, MD;  Location: [facility];  Service: Urology;  Laterality: Left;   CYSTOSCOPY WITH STENT PLACEMENT Left 04/11/2017   Procedure: CYSTOSCOPY WITH STENT PLACEMENT;  Surgeon: Nieves Cough, MD;  Location: [facility];  Service: Urology;  Laterality: Left;   URETEROSCOPY  05/12/2017   Procedure: URETEROSCOPY;  Surgeon: Chauncey Redell Agent, MD;  Location: [facility];  Service: Urology;;   FAMILY HISTORY: Reported history of ovarian and lung cancer. Diabetes, hypertension.  ADVANCED DIRECTIVES:   HEALTH MAINTENANCE: Social History   Tobacco Use   Smoking status: Former    Types: Cigarettes    Passive exposure: Past   Smokeless tobacco: Never  Tobacco comments:    quit in Feb of 1997  Vaping Use   Vaping status: Never Used  Substance Use Topics   Alcohol use: No   Drug use: No    Colonoscopy:  PAP:  Bone density:  Lipid  panel:  Allergies  Allergen Reactions   Celecoxib Other (See Comments)    Increased Blood Pressure   Chlorpromazine Nausea Only   Iodinated Contrast Media Other (See Comments)    Stage 3 kidney disease, told IV dye would be bad for him   Prednisone Other (See Comments)    Diabetic   Amoxicillin-Pot Clavulanate Rash    Has patient had a PCN reaction causing immediate rash, facial/tongue/throat swelling, SOB or lightheadedness with hypotension: No Has patient had a PCN reaction causing severe rash involving mucus membranes or skin necrosis: No Has patient had a PCN reaction that required hospitalization: No Has patient had a PCN reaction occurring within the last 10 years: Yes If all of the above answers are NO, then may proceed with Cephalosporin use.    Penicillin V Potassium Rash   Current Outpatient Medications  Medication Sig Dispense Refill   Aflibercept 2 MG/0.05ML SOLN Inject 1 Dose into the eye as directed. One injection into left eye every 8-9 weeks     atorvastatin  (LIPITOR) 10 MG tablet Take 10 mg by mouth at bedtime.      BD PEN NEEDLE NANO 2ND GEN 32G X 4 MM MISC USE AS DIRECTED ONCE A DAY     carvedilol  (COREG ) 12.5 MG tablet Take 12.5 mg by mouth 2 (two) times daily with a meal.     Cholecalciferol (VITAMIN D -3) 5000 units TABS Take 5,000 Units by mouth at bedtime.      clonazePAM  (KLONOPIN ) 1 MG tablet Take 1 mg by mouth at bedtime.      cyanocobalamin 500 MCG tablet Take 1,000 mcg by mouth at bedtime.      diphenoxylate -atropine  (LOMOTIL ) 2.5-0.025 MG tablet Take 1 tablet by mouth 4 (four) times daily as needed for diarrhea or loose stools. 60 tablet 1   empagliflozin (JARDIANCE) 10 MG TABS tablet 10 mg daily.     epoetin  alfa-epbx (RETACRIT ) 40000 UNIT/ML injection Inject 40,000 Units into the skin every 6 (six) weeks. Depending on lab results (if needed)     fexofenadine (ALLEGRA) 180 MG tablet Take 180 mg by mouth daily as needed for allergies.     finasteride   (PROSCAR ) 5 MG tablet Take 1 tablet (5 mg total) by mouth daily. 90 tablet 3   folic acid  (FOLVITE ) 400 MCG tablet Take 400 mcg by mouth daily.     ibrutinib  (IMBRUVICA ) 420 MG tablet Take 1 tablet (420 mg total) by mouth daily. Take with a glass of water. 28 tablet 1   insulin  glargine (LANTUS ) 100 UNIT/ML injection Inject 20-30 Units into the skin every morning. Takes Lantus  20 units if CBG less than 120 mg/dl or Lantus  30 units if CBG is over 120 mg/dl     Multiple Vitamins-Minerals (PRESERVISION AREDS 2 PO) Take 1 capsule by mouth 2 (two) times daily.      prednisoLONE acetate (PRED FORTE) 1 % ophthalmic suspension Place 1 drop into the right eye 4 (four) times daily.     sertraline (ZOLOFT) 100 MG tablet Take 100 mg by mouth daily.     tamsulosin  (FLOMAX ) 0.4 MG CAPS capsule TAKE 1 CAPSULE(0.4 MG) BY MOUTH DAILY AFTER SUPPER 90 capsule 3   telmisartan (MICARDIS) 40 MG tablet Take 40  mg by mouth daily.     torsemide (DEMADEX) 10 MG tablet Take by mouth.     triamcinolone (NASACORT) 55 MCG/ACT AERO nasal inhaler Place 2 sprays into the nose 2 (two) times daily as needed (allergies).      No current facility-administered medications for this visit.   Facility-Administered Medications Ordered in Other Visits  Medication Dose Route Frequency Provider Last Rate Last Admin   epoetin  alfa-epbx (RETACRIT ) injection 40,000 Units  40,000 Units Subcutaneous Once Finnegan, Timothy J, MD       OBJECTIVE: Vitals:   05/01/24 1019  BP: (!) 148/71  Pulse: (!) 54  Resp: 18  SpO2: 98%  Body mass index is 31.15 kg/m.    ECOG FS:1 - Symptomatic but completely ambulatory  General: Well-developed, well-nourished, no acute distress. Eyes: Pink conjunctiva, anicteric sclera. HEENT: Normocephalic, moist mucous membranes. Lungs: No audible wheezing or coughing. Heart: Regular rate and rhythm. Abdomen: Soft, nontender, no obvious distention. Musculoskeletal: Bilateral upper extremities with 1-2+  edema. Neuro: Alert, answering all questions appropriately. Cranial nerves grossly intact. Skin: No rashes or petechiae noted. Psych: Normal affect.   LAB RESULTS: Lab Results  Component Value Date   NA 134 (L) 05/01/2024   K 4.8 05/01/2024   CL 110 05/01/2024   CO2 20 (L) 05/01/2024   GLUCOSE 123 (H) 05/01/2024   BUN 65 (H) 05/01/2024   CREATININE 3.84 (H) 05/01/2024   CALCIUM  7.6 (L) 05/01/2024   PROT 4.8 (L) 05/01/2024   ALBUMIN 3.0 (L) 05/01/2024   AST 15 05/01/2024   ALT 19 05/01/2024   ALKPHOS 122 05/01/2024   BILITOT 0.8 05/01/2024   GFRNONAA 14 (L) 05/01/2024   GFRAA 37 (L) 11/19/2019   Lab Results  Component Value Date   WBC 8.9 05/01/2024   NEUTROABS 0.9 (L) 05/01/2024   HGB 6.3 (LL) 05/01/2024   HCT 19.8 (L) 05/01/2024   MCV 106.5 (H) 05/01/2024   PLT 20 (L) 05/01/2024   Lab Results  Component Value Date   IRON 79 01/11/2024   TIBC 304 01/11/2024   IRONPCTSAT 26 01/11/2024   Lab Results  Component Value Date   FERRITIN 142 01/11/2024    STUDIES: No results found.  ASSESSMENT: CLL, anemia/thrombocytopenia  PLAN:   CLL: Bone marrow biopsy on January 08, 2024 reported 69% clonal B-cell lymphocytes consistent with a diagnosis of CLL.  Patient does not have any peripheral lymphocytosis.  No other pathology was reported.  It is possible patient's chronic thrombocytopenia, worsening anemia, night sweats are related to his CLL, therefore patient was initiated on dose reduced Imbruvica  280 mg daily in early April 2025.  He increased to 420 mg at the end of May 2025 and is tolerating treatment well.  Patient's total white blood cell count continues to trend down and is now 8.9.   Anemia, unspecified: Possibly related to progression of CLL, but we do not have a previous bone marrow biopsy for comparison.  Hemoglobin trended down to 6.3.  Plan for 2 units pRBCs. He will receive lasix  after. All blood products should be irradiated. Thrombocytopenia: Chronic and  unchanged.  Patient's platelet count is 20 today.  Monitor.  Bone marrow biopsy as above.  Previously, patient required multiple transfusions of platelets prior to his oral surgery.  He did not have any excessive bleeding during his surgery.  His count was as high as 95 in June 2016.  He does not require transfusion today.   Lymphadenopathy: Patient's most recent CT scan to evaluate his aortic  aneurysm on Mar 05, 2024 did not reveal any obvious lymphadenopathy.   Aortic aneurysm: Appreciate vascular surgery input.  Given patient's poor kidney function, surgical repair is likely not possible.  Patient reports vascular surgery will repeat CT scan in 6 months.   Dental surgery: Successful and resolved. Renal insufficiency: Patient's GFR seems to be trending down and is now 13.  Continue monitoring and treatment per nephrology.  Imbruvica  does not need to be dose reduced in the setting of renal dysfunction. Edema: Possibly related to worsening renal insufficiency.  Disposition:  - Ref to OT/Teresa Gilliam - 2 units of PRBCs tomorrow. No premeds. Lasix  after each unit. Irradiated. - 2 weeks: D1 lab and see Finn. D2 possible 2 units of PRBCs and 1 unit platelets with lasix - la  Patient expressed understanding and was in agreement with this plan. He also understands that He can call clinic at any time with any questions, concerns, or complaints.    Cancer Staging  Chronic lymphocytic leukemia (HCC) Staging form: Chronic Lymphocytic Leukemia / Small Lymphocytic Lymphoma, AJCC 8th Edition - Clinical stage from 01/12/2024: Modified Rai Stage IV (Modified Rai risk: High, Lymphocytosis: Absent, Adenopathy: Absent, Organomegaly: Absent, Anemia: Present, Thrombocytopenia: Present) - Signed by [name], MD on 01/13/2024   Tinnie KANDICE Dawn, NP   05/01/2024

## 2024-05-02 ENCOUNTER — Inpatient Hospital Stay

## 2024-05-02 DIAGNOSIS — D696 Thrombocytopenia, unspecified: Secondary | ICD-10-CM

## 2024-05-02 DIAGNOSIS — C9110 Chronic lymphocytic leukemia of B-cell type not having achieved remission: Secondary | ICD-10-CM | POA: Diagnosis not present

## 2024-05-02 DIAGNOSIS — D631 Anemia in chronic kidney disease: Secondary | ICD-10-CM

## 2024-05-02 MED ORDER — FUROSEMIDE 10 MG/ML IJ SOLN
20.0000 mg | Freq: Once | INTRAMUSCULAR | Status: AC
  Administered 2024-05-02: 20 mg via INTRAVENOUS
  Filled 2024-05-02: qty 2

## 2024-05-02 MED ORDER — SODIUM CHLORIDE 0.9% IV SOLUTION
250.0000 mL | INTRAVENOUS | Status: DC
  Administered 2024-05-02: 250 mL via INTRAVENOUS
  Filled 2024-05-02: qty 250

## 2024-05-03 ENCOUNTER — Other Ambulatory Visit: Payer: Self-pay

## 2024-05-03 ENCOUNTER — Encounter (INDEPENDENT_AMBULATORY_CARE_PROVIDER_SITE_OTHER): Payer: Self-pay

## 2024-05-03 LAB — TYPE AND SCREEN
ABO/RH(D): A POS
Antibody Screen: NEGATIVE
Unit division: 0
Unit division: 0

## 2024-05-03 LAB — BPAM RBC
Blood Product Expiration Date: 202508152359
Blood Product Unit Number: 202508152359
ISSUE DATE / TIME: 202507241049
PRODUCT CODE: 202507241300
PRODUCT CODE: 202508152359
Unit Type and Rh: 5100
Unit Type and Rh: 202508152359
Unit Type and Rh: 5100
Unit Type and Rh: 6200

## 2024-05-04 LAB — SAMPLE TO BLOOD BANK

## 2024-05-06 ENCOUNTER — Other Ambulatory Visit: Payer: Self-pay

## 2024-05-06 NOTE — Progress Notes (Signed)
 Specialty Pharmacy Refill Coordination Note  Patrick Payne. is a 89 y.o. male contacted today regarding refills of specialty medication(s) Ibrutinib  (IMBRUVICA )   Patient requested Delivery   Delivery date: 05/08/24   Verified address: [address]    [place]. [address]   Medication will be filled on 05/07/24.

## 2024-05-08 ENCOUNTER — Inpatient Hospital Stay: Admitting: Occupational Therapy

## 2024-05-08 ENCOUNTER — Other Ambulatory Visit: Payer: Self-pay | Admitting: Nurse Practitioner

## 2024-05-08 DIAGNOSIS — R600 Localized edema: Secondary | ICD-10-CM

## 2024-05-08 DIAGNOSIS — R601 Generalized edema: Secondary | ICD-10-CM

## 2024-05-08 DIAGNOSIS — R609 Edema, unspecified: Secondary | ICD-10-CM | POA: Insufficient documentation

## 2024-05-08 NOTE — Therapy (Signed)
 [facility] - A Dept Of [place]. [facility] [address], [address] [place], [place], [address] Phone: [phone]   Fax:  [phone]  Occupational Therapy Screen  Patient Details  Name: Patrick Payne. MRN: [id number] Date of Birth: 07-13-1934 No data recorded  Encounter Date: 05/08/2024   OT End of Session - 05/08/24 1559     Visit Number 0          Past Medical History:  Diagnosis Date   Anxiety    CKD (chronic kidney disease), stage III (HCC)    CLL (chronic lymphocytic leukemia) (HCC)    Diabetes mellitus without complication (HCC)    Diverticulitis 04/09/2017   History of kidney stones    HLD (hyperlipidemia)    HTN (hypertension)    PKD (polycystic kidney disease)    Sleep apnea    Uncontrolled type 2 diabetes mellitus with hyperglycemia (HCC) 06/28/2018    Past Surgical History:  Procedure Laterality Date   CARDIAC CATHETERIZATION  12/1987   CENTRAL LINE INSERTION N/A 04/11/2017   Procedure: Central Line Insertion;  Surgeon: Jama Cordella MATSU, MD;  Location: [facility];  Service: Cardiovascular;  Laterality: N/A;   CERVICAL FUSION     CHOLECYSTECTOMY     CYSTOSCOPY W/ RETROGRADES Left 05/12/2017   Procedure: CYSTOSCOPY WITH RETROGRADE PYELOGRAM;  Surgeon: Chauncey Redell Agent, MD;  Location: [facility] OR[facility];  Service: Urology;  Laterality: Left;   CYSTOSCOPY W/ URETERAL STENT PLACEMENT Left 05/12/2017   Procedure: CYSTOSCOPY WITH STENT REMOVAL;  Surgeon: Chauncey Redell Agent, MD;  Location: [facility];  Service: Urology;  Laterality: Left;   CYSTOSCOPY WITH STENT PLACEMENT Left 04/11/2017   Procedure: CYSTOSCOPY WITH STENT PLACEMENT;  Surgeon: Nieves Cough, MD;  Location: [facility];  Service: Urology;  Laterality: Left;   URETEROSCOPY  05/12/2017   Procedure: URETEROSCOPY;  Surgeon: Chauncey Redell Agent, MD;  Location: [facility] OR[facility];  Service: Urology;;    There were no vitals filed for this visit.   TINNIE DAWN  NP 05/01/24 ASSESSMENT: CLL, anemia/thrombocytopenia   INTERVAL HISTORY:      Patient returns to clinic today for repeat laboratory work, further evaluation, and consideration of additional blood.  He continues to have chronic weakness and fatigue.  He has easy bruising.  He has bilateral arm swelling that is unchanged. He continues to tolerate 420 mg Imbruvica  daily without significant side effects. He has no neurologic complaints today.  He denies any chest pain, shortness of breath, cough, or hemoptysis.  He denies any nausea, vomiting, constipation, or diarrhea. He has no urinary complaints.  Patient offers no further specific complaints today.  PLAN:     CLL: Bone marrow biopsy on January 08, 2024 reported 69% clonal B-cell lymphocytes consistent with a diagnosis of CLL.  Patient does not have any peripheral lymphocytosis.  No other pathology was reported.  It is possible patient's chronic thrombocytopenia, worsening anemia, night sweats are related to his CLL, therefore patient was initiated on dose reduced Imbruvica  280 mg daily in early April 2025.  He increased to 420 mg at the end of May 2025 and is tolerating treatment well.  Patient's total white blood cell count continues to trend down and is now 12.9.  Return to clinic tomorrow for 1 unit of packed red blood cells.  Patient will then return to clinic in 2 weeks for further evaluation and continuation of treatment.   Anemia, unspecified: Possibly related to progression of CLL, but  we do not have a previous bone marrow biopsy for comparison.  Hemoglobin trended down to 6.8.  After discussion with the patient, he only wishes to have 1 unit of packed red blood cells.  Return to clinic tomorrow for treatment.  All blood products should be irradiated. Thrombocytopenia: Chronic and unchanged.  Patient's platelet count is 24 today.  Monitor.  Bone marrow biopsy as above.  Previously, patient required multiple transfusions of platelets prior to his  oral surgery.  He did not have any excessive bleeding during his surgery.  His count was as high as 95 in June 2016.  He does not require transfusion today.   Lymphadenopathy: Patient's most recent CT scan to evaluate his aortic aneurysm on Mar 05, 2024 did not reveal any obvious lymphadenopathy.   Aortic aneurysm: Appreciate vascular surgery input.  Given patient's poor kidney function, surgical repair is likely not possible.  Patient reports vascular surgery will repeat CT scan in 6 months.   Dental surgery: Successful and resolved. Renal insufficiency: Patient's GFR seems to be trending down and is now 13.  Continue monitoring and treatment per nephrology.  Imbruvica  does not need to be dose reduced in the setting of renal dysfunction. Edema: Possibly related to worsening renal insufficiency.    OT SCREEN  05/08/24:   Patient and wife arrive with reports of increased edema in bilateral upper extremity as well as lower legs last 6 weeks.  Legs was seeping try different bandages and compression per wife and patient.  Patient did arrive with light knee-high compression stockings. Upper extremities patient attempted modified compression socks and tried to order Isotoner gloves or compression.  Unable to fit.  Patient has a platelet disorder causing bleeding and bruising. Patient denies any cardiac or pulmonary issues. O2 sats 98% heart rate 58 Denies any shortness of breath Reports being more sedentary. Visually impaired.  Uses a rollator in the house and furniture Patient able to bathe himself walk-in shower with a seat As well as dress himself.   LYMPHEDEMA/ONCOLOGY QUESTIONNAIRE - 05/08/24 0001       Right Upper Extremity Lymphedema   Olecranon Process 30.3 cm    15 cm Proximal to Ulnar Styloid Process 28.5 cm    10 cm Proximal to Ulnar Styloid Process 24.5 cm    Just Proximal to Ulnar Styloid Process 21.5 cm    Across Hand at [facility] 23 cm    At [place] of 2nd Digit 7.5 cm       Left Upper Extremity Lymphedema   Olecranon Process 31.2 cm    15 cm Proximal to Ulnar Styloid Process 31 cm    10 cm Proximal to Ulnar Styloid Process 27 cm    Just Proximal to Ulnar Styloid Process 21.5 cm    Across Hand at [facility] 22 cm    At [place] of 2nd Digit 7.3 cm         Patient was fitted with large Isotoner gloves as well as a Tubigrip F from metacarpals to elbow for light compression to wear during the day as well as if possible through the night. Patient can remove in the mornings in the evenings for 2 to 4 hours. Patient to do 3 times a day some finger flexion, wrist flexion extension as elbow flexion extension in compression. 10 reps. Educated in precautions or if any skin issues occur to remove and keep off until evaluation with lymphedema therapist at [facility].  Tuesday, 5 August.   Both  verbalized understanding.     Visit Diagnosis: Generalized edema    Problem List Patient Active Problem List   Diagnosis Date Noted   Edema 05/08/2024   CKD (chronic kidney disease) stage 4, GFR 15-29 ml/min (HCC) 03/12/2024   Arthralgia of right ankle 12/22/2023   Exudative age-related macular degeneration of right eye with active choroidal neovascularization (HCC) 03/21/2023   Preop cardiovascular exam 01/19/2023   Acute renal failure superimposed on stage 3b chronic kidney disease (HCC) 12/14/2022   Community acquired pneumonia of right lower lobe of lung 12/14/2022   Hydroureter on left 12/14/2022   Acute diarrhea 12/14/2022   Thrombocytopenia (HCC) 12/14/2022   Urinary tract disease 09/28/2021   Hypertension 09/28/2021   Chronic kidney disease, stage IV (severe) (HCC) 05/24/2021   Carotid stenosis 10/23/2020   Aortic atherosclerosis (HCC) 11/08/2019   Anemia in chronic kidney disease 11/05/2019   Secondary hyperparathyroidism of renal origin (HCC) 11/05/2019   Benign hypertensive kidney disease with chronic kidney disease 08/05/2019   Nephrolithiasis  08/05/2019   Proteinuria 08/05/2019   GAD (generalized anxiety disorder) 07/03/2018   DM type 2 with diabetic peripheral neuropathy (HCC) 06/28/2018   Polycystic kidney disease - right side 04/10/2017   Flank pain 04/09/2017   Long term current use of insulin  (HCC) 07/16/2014   Insulin  long-term use (HCC) 07/16/2014   Abdominal aortic aneurysm (AAA) (HCC) 04/17/2014   Chronic kidney disease, stage 3b (HCC) - baseline SCr 2.2-2.5 04/17/2014   Chronic lymphocytic leukemia (HCC) 04/17/2014   Type 2 diabetes mellitus with chronic kidney disease, with long-term current use of insulin  (HCC) 04/17/2014   Essential hypertension 04/17/2014   HLD (hyperlipidemia) 04/17/2014   Obstructive apnea 04/17/2014   AAA (abdominal aortic aneurysm) (HCC) 04/17/2014   CKD (chronic kidney disease), stage III (HCC) 04/17/2014   CLL (chronic lymphocytic leukemia) (HCC) 04/17/2014   Type 2 diabetes mellitus (HCC) 04/17/2014   H/O malignant neoplasm of skin 06/07/2013    Ancel Peters, OTR/L,CLT 05/08/2024, 4:02 PM  [facility] - A Dept Of [facility]. [facility] [address], [address] [place], [place], [address] Phone: [phone]   Fax:  [phone]  Name: Patrick Payne [facility]. MRN: [id number] Date of Birth: April 09, 1934

## 2024-05-14 ENCOUNTER — Encounter: Payer: Self-pay | Admitting: Occupational Therapy

## 2024-05-14 ENCOUNTER — Ambulatory Visit: Attending: Nurse Practitioner | Admitting: Occupational Therapy

## 2024-05-14 DIAGNOSIS — R600 Localized edema: Secondary | ICD-10-CM | POA: Insufficient documentation

## 2024-05-14 DIAGNOSIS — I890 Lymphedema, not elsewhere classified: Secondary | ICD-10-CM | POA: Diagnosis present

## 2024-05-14 NOTE — Therapy (Unsigned)
 OUTPATIENT OCCUPATIONAL THERAPY EVALUATION  BILATERAL LOWER EXTREMITY/ LOWER QUADRANT  LYMPHEDEMA   BILATERAL UPPER EXTREMITY/ UPPER QUADRANT  LYMPHEDEMA     Patient Name: Patrick Payne. MRN: [id number] DOB:1933-11-24, 89 y.o., male Today's Date: 05/14/2024  END OF SESSION:   OT End of Session - 05/14/24 0812     Visit Number 1    Number of Visits 6    Date for OT Re-Evaluation 08/12/24    OT Start Time 0810    OT Stop Time 0934    OT Time Calculation (min) 84 min    Activity Tolerance Patient tolerated treatment well;No increased pain    Behavior During Therapy WFL for tasks assessed/performed            Past Medical History:  Diagnosis Date   Anxiety    CKD (chronic kidney disease), stage III (HCC)    CLL (chronic lymphocytic leukemia) (HCC)    Diabetes mellitus without complication (HCC)    Diverticulitis 04/09/2017   History of kidney stones    HLD (hyperlipidemia)    HTN (hypertension)    PKD (polycystic kidney disease)    Sleep apnea    Uncontrolled type 2 diabetes mellitus with hyperglycemia (HCC) 06/28/2018   Past Surgical History:  Procedure Laterality Date   CARDIAC CATHETERIZATION  12/1987   CENTRAL LINE INSERTION N/A 04/11/2017   Procedure: Central Line Insertion;  Surgeon: Jama Cordella MATSU, MD;  Location: [facility];  Service: Cardiovascular;  Laterality: N/A;   CERVICAL FUSION     CHOLECYSTECTOMY     CYSTOSCOPY W/ RETROGRADES Left 05/12/2017   Procedure: CYSTOSCOPY WITH RETROGRADE PYELOGRAM;  Surgeon: Chauncey Redell Agent, MD;  Location: [facility];  Service: Urology;  Laterality: Left;   CYSTOSCOPY W/ URETERAL STENT PLACEMENT Left 05/12/2017   Procedure: CYSTOSCOPY WITH STENT REMOVAL;  Surgeon: Chauncey Redell Agent, MD;  Location: [facility];  Service: Urology;  Laterality: Left;   CYSTOSCOPY WITH STENT PLACEMENT Left 04/11/2017   Procedure: CYSTOSCOPY WITH STENT PLACEMENT;  Surgeon: Nieves Cough, MD;  Location: [facility];  Service:  Urology;  Laterality: Left;   URETEROSCOPY  05/12/2017   Procedure: URETEROSCOPY;  Surgeon: Chauncey Redell Agent, MD;  Location: [facility];  Service: Urology;;   Patient Active Problem List   Diagnosis Date Noted   Edema 05/08/2024   CKD (chronic kidney disease) stage 4, GFR 15-29 ml/min (HCC) 03/12/2024   Arthralgia of right ankle 12/22/2023   Exudative age-related macular degeneration of right eye with active choroidal neovascularization (HCC) 03/21/2023   Preop cardiovascular exam 01/19/2023   Acute renal failure superimposed on stage 3b chronic kidney disease (HCC) 12/14/2022   Community acquired pneumonia of right lower lobe of lung 12/14/2022   Hydroureter on left 12/14/2022   Acute diarrhea 12/14/2022   Thrombocytopenia (HCC) 12/14/2022   Urinary tract disease 09/28/2021   Hypertension 09/28/2021   Chronic kidney disease, stage IV (severe) (HCC) 05/24/2021   Carotid stenosis 10/23/2020   Aortic atherosclerosis (HCC) 11/08/2019   Anemia in chronic kidney disease 11/05/2019   Secondary hyperparathyroidism of renal origin (HCC) 11/05/2019   Benign hypertensive kidney disease with chronic kidney disease 08/05/2019   Nephrolithiasis 08/05/2019   Proteinuria 08/05/2019   GAD (generalized anxiety disorder) 07/03/2018   DM type 2 with diabetic peripheral neuropathy (HCC) 06/28/2018   Polycystic kidney disease - right side 04/10/2017   Flank pain 04/09/2017   Long term current use of insulin  (HCC) 07/16/2014   Insulin  long-term use (HCC) 07/16/2014   Abdominal aortic  aneurysm (AAA) (HCC) 04/17/2014   Chronic kidney disease, stage 3b (HCC) - baseline SCr 2.2-2.5 04/17/2014   Chronic lymphocytic leukemia (HCC) 04/17/2014   Type 2 diabetes mellitus with chronic kidney disease, with long-term current use of insulin  (HCC) 04/17/2014   Essential hypertension 04/17/2014   HLD (hyperlipidemia) 04/17/2014   Obstructive apnea 04/17/2014   AAA (abdominal aortic aneurysm) (HCC) 04/17/2014   CKD  (chronic kidney disease), stage III (HCC) 04/17/2014   CLL (chronic lymphocytic leukemia) (HCC) 04/17/2014   Type 2 diabetes mellitus (HCC) 04/17/2014   H/O malignant neoplasm of skin 06/07/2013    PCP: Reyes JONETTA Costa, MD  REFERRING PROVIDER: Tinnie Dawn, NP  REFERRING DIAG: I89.0  THERAPY DIAG:  Lymphedema, not elsewhere classified  Rationale for Evaluation and Treatment: Rehabilitation  ONSET DATE: ~ 1 year s/p insidious onset  SUBJECTIVE:                                                                                                                                                                                          SUBJECTIVE STATEMENT:Patrick Payne is referred to Occupational Therapy by Tinnie KANDICE Dawn, NP, for evaluation and treatment of chronic, multi factorial, bilateral upper and lower extremity swelling. Patrick Payne reports that he first  noticed leg and arm swelling and a red discoloration when he started chemo. Pt reports that swelling fluctuates some, and typically gets better over night. He wears off the shelf  compression socks. He finally found some he can put on and take off without help. Pt recently elbow length Tubigrip sleeves and off the shelf compression gloves for his hands from OT. I think they have helped. Pt reports he had fluid leaking from the back of his left leg when swelling was at it;['s worst. He tells me he lost 5 pounds after taking his diuretic. Pt's goals for OT are to reduce and control swelling in his arms and legs and limit infection risk. Pt and his spouse express concern about spending so much time at medical appointments.  PERTINENT HISTORY:   Edema 05/08/2024   CKD (chronic kidney disease) stage 4, GFR 15-29 ml/min (HCC) 03/12/2024   Arthralgia of right ankle 12/22/2023   Exudative age-related macular degeneration of right eye with active choroidal neovascularization (HCC) 03/21/2023   Acute renal failure superimposed on stage 3 b  chronic kidney disease (HCC) 12/14/2022   Community acquired pneumonia of right lower lobe of lung 12/14/2022   Hypertension 09/28/2021   Chronic kidney disease, stage IV (severe) (HCC) 05/24/2021   Carotid stenosis 10/23/2020   Aortic atherosclerosis (HCC) 11/08/2019   Anemia in chronic kidney disease 11/05/2019   Benign  hypertensive kidney disease with chronic kidney disease 08/05/2019   Nephrolithiasis 08/05/2019   Proteinuria 08/05/2019   GAD (generalized anxiety disorder) 07/03/2018   DM type 2 with diabetic peripheral neuropathy (HCC) 06/28/2018   Polycystic kidney disease - right side 04/10/2017   Long term current use of insulin  (HCC) 07/16/2014   Abdominal aortic aneurysm (AAA) (HCC) 04/17/2014   Chronic kidney disease, stage 3 b (HCC) - baseline SCr 2.2-2.5 04/17/2014   Chronic lymphocytic leukemia (HCC) 04/17/2014   Type 2 diabetes mellitus with chronic kidney disease, with long-term current use of insulin  (HCC) 04/17/2014   Essential hypertension 04/17/2014   HLD (hyperlipidemia) 04/17/2014   Obstructive apnea 04/17/2014   AAA (abdominal aortic aneurysm) (HCC) 04/17/2014   CKD (chronic kidney disease), stage III (HCC) 04/17/2014   CLL (chronic lymphocytic leukemia) (HCC) 04/17/2014   Type 2 diabetes mellitus (HCC) 04/17/2014   H/O malignant neoplasm of skin 06/07/2013   PAIN:  Are you having pain? Yes: NPRS scale: not rated numerically bilateral legs, bilateral arms Pain location: Bilateral arms and legs Pain description: heaviness, fullness Aggravating factors: standing, walking Relieving factors: elevation, rubbing  PRECAUTIONS: Fall and Other: LYMPHEDEMA PRECAUTIONS: THYROID, KIDNEY, CARDIAC, PULMONARY, DIABETES SKIN; Fall risk, increased cellulitis  risk  RED FLAGS: Abdominal aneurysm: Yes:     WEIGHT BEARING RESTRICTIONS: No  FALLS:  Has patient fallen in last 6 months? No  LIVING ENVIRONMENT: Lives with: lives with their spouse Lives in:  House/apartment Stairs: No;  Has following equipment at home: Single point cane, Environmental consultant - 2 wheeled, Wheelchair (manual), Tour manager, Grab bars, and hand held shower  OCCUPATION: retired Comptroller  LEISURE: family time  HAND DOMINANCE: right   PRIOR LEVEL OF FUNCTION:  Modified independence with household mobility with device, modified independence with basic ADLs, mod I with instrumental ADLs  PATIENT GOALS: reduce limb swelling and associated pain and keep it from getting worse. Limit infection risk   OBJECTIVE: Note: Objective measures were completed at Evaluation unless otherwise noted.  COGNITION:  Overall cognitive status: Within functional limits for tasks assessed   OBSERVATIONS / OTHER ASSESSMENTS:   POSTURE: head forward , flexible kyphosis  LE ROM: limited at hips, knees, ankles,  feet, and hands due to girth at joints and skin approximation due to swelling  LYMPHEDEMA ASSESSMENTS:   SURGERY TYPE/DATE: N/A  NUMBER OF LYMPH NODES REMOVED: N/A  CHEMOTHERAPY: Chemo for CLL  RADIATION: N/a  HORMONE TREATMENT: N/A  INFECTIONS: Hx of infection   LYMPHEDEMA LIFE IMPACT SCALE (LLIS): 29.41% (The extent to which lymphedema-related problems affected your life in the past week)  BLE COMPARATIVE LIMB VOLUMETRICS TBA OT Rx 1  LANDMARK RIGHT   R LEG (A-D) N/A  R THIGH (E-G) ml  R FULL LIMB (A-G) ml  Limb Volume differential (LVD)  %  Volume change since last measured %  Volume change since commencing CDT V  (Blank rows = not tested)  LANDMARK LEFT    L LEG (A-D) N/A  L THIGH (E-G) ml  L FULL LIMB (A-G) ml  Limb Volume differential (LVD)  %  Volume change since last visit %  Volume change since commencing CDT %  (Blank rows = not tested)       Skin/ Tissue Condition  Skin  Description Hyper-Keratosis Peau d' Orange Shiny Tight Fibrotic/ Indurated Fatty Doughy Cobblestone      x x  x    Skin dry Flaky WNL Macerated   mildly      Color  Redness Varicosities Blanching Hemosiderin Stain Mottled   x    Arms and calves x   Odor Malodorous Yeast Fungal infection  WNL      x   Temperature Warm Cool wnl    x     Pitting Edema   1+ 2+ 3+ 4+ Non-pitting      x      Girth Symmetrical Asymmetrical                   Distribution    L>R toes to groin    Stemmer Sign Positive Negative   +    Lymphorrhea History Of:  Present Absent   x Posterior L leg     Wounds History Of Present Absent Venous Arterial Pressure Sheer     x        Signs of Infection Redness Warmth Erythema Acute Swelling Drainage Borders                    Sensation Light Touch Deep pressure Hypersensitivity   In tact Impaired In tact Impaired Absent Impaired   x   x  x     Nails WNL   Fungus nail dystrophy        Hair Growth Symmetrical Asymmetrical   x    Skin Creases Base of toes  Ankles   Base of Fingers knees       Abdominal pannus Thigh Lobules  Face/neck   x x x                                                                                                                              TREATMENT:  OT EVAL AND TREAT PT AND FAMILY EDU   PATIENT EDUCATION:  Discussed differential diagnoses for various swelling disorders. Provided basic level education regarding lymphatic structure and function, etiology, onset patterns, stages of progression, and prevention to limit infection risk, worsening condition and further functional decline. Pt edu for aught interaction between blood circulatory system and lymphatic circulation.Discussed  impact of gravity and co-morbidities on lymphatic function. Outlined Complete Decongestive Therapy (CDT)  as standard of care and provided in depth information regarding 4 primary components of Intensive and Self Management Phases, including Manual Lymph Drainage (MLD), compression wrapping and garments, skin care, and therapeutic exercise. Dempsey discussion with re need for frequent attendance and high  burden of care when caregiver is needed, impact of co morbidities. We discussed  the chronic, progressive nature of lymphedema and Importance of daily, ongoing LE self-care essential for limiting progression and infection risk.  Person educated: Patient and spouse Education method: Explanation, Demonstration, and Handouts Education comprehension: verbalized understanding, returned demonstration, verbal cues required, and needs further education  HOME EXERCISE PROGRAM: Lymphedema Self-Care home program LYMPHEDEMA SELF-CARE HOME PROGRAM: BLE lymphatic pumping there ex- 1 set of 10 each element, in order. Hold 5. 2 x daily  2. Daily, short stretch, knee length, multilayer compression bandages to  one leg at a time during Intensive Phase CDT  3. Custom-made gradient compression garments and HOS devices are medically necessary because they are uniquely sized and shaped to fit the exact dimensions of the affected extremities, and to provide appropriate medical grade, graduated compression essential for optimally managing chronic, progressive lymphedema. Multiple custom compression garments are needed to ensure proper hygiene to limit infection risk. Custom compression garments should be replaced q 3-6 months When worn consistently for optimal lipo-lymphedema self-management over time. HOS devices, medically necessary to limit fibrosis buildup in tissue, should be replaced q 2 years and PRN when worn out.    4. During self-Management Phase fit with appropriate medical grade, gradient compression stockings paired with compression Capri style leggings for necessary compression and containment to control swelling and reduce pain.  5. Daily skin care with low ph lotion matching skin ph to reduce infection risk.  6.  Daily simple self MLD to limit progression.  7. Leg elevation when seated    ASSESSMENT  CLINICAL IMPRESSION: Patrick Payne Jr. Is an 89 yo male presenting with chronic, fluctuating,  BLE/BLQ and BUE/BUQ lymphedema 2/2 suspected systemic overload of the lymphatic system by due to impaired kidney function, and suspected impairment of lymphatic function resulting from chemotherapy. Lymphedema limits functional ambulation and mobility, basic and instrumental ADLs performance, leisure pursuits, productive activities , social participation, and psychosocial concerns, including body image, embarrassment, decreased quality of life.  Pt also presents with a large constellation of factors contributing to swelling in all 4 limbs, which is atypical for lymphedema.  These factors include renal insufficiency, hypertension, and sleep apnea.   Chronic, progressive lymphedema limits functional performance in all occupational domains, including basic and instrumental ADLs, functional ambulation and mobility,  leisure pursuits, productive activities , social participation, and psychosocial concerns, including body image, embarrassment, and quality of life. Pt will benefit from skilled Occupational Therapy for modified Complete Decongestive Therapy (CDT), the gold standard for lymphedema care.  Modified Intensive Phase CDT will include fitting with adjustable. Knee length, BLE compression garment alternatives that are easy to don and doff. It will include manual lymphatic drainage (MLD) to reduce lymph volume and stimulate lymphangiomotoricity. Pt will benefit from skin care during MLD and multiple times during the day to limit infection risk. Pt will learn lymphatic pumping ther ex. Pt should remain as active as possible, but due to elevated fall risk he should perform lymphatic ther ex when seated or laying down.   Chronic , progressive, BLE/BLQ  and BUE/BUQ lymphedema has a high burden of care. Mr Saltz needs caregiver assistance to don and doff adjustable, knee length, Velcro closure style, compression leggings, which are an adjustable alternative to elastic compression stockings. These should be worn  full time during waking hours and, at present, removed during HOS. CircAid Juxtafit Essentials, size TBD,  should be worn over soft cotton liners to protect skin. Medical grade, gradient compression should be calibrated at compression class  1 (18-21 mmHg).  Foot pieces can be removed to fit shoes and socks when going out. Each CircAid leggings should be worn with a well fitting cover to limit skin injury and to limit Velcro contact with clothing.  Pt should also be fitted with comfortable, light, but effective compression garments for arms and hands that limit UE function as little as possible.   Skilled OT will focus on managing symptoms and progression of chronic lymphedema. The goals of treatment are to decrease swelling and associated pain/ discomfort, reduce infection risk,  improve functional performance and mobility, improve skin integrity,  and improving quality of life. Without skilled OT for CDT infection risk will increase, lymphedema will progress, and further functional decline is expected.  OBJECTIVE IMPAIRMENTS: Abnormal gait, cardiopulmonary status limiting activity, decreased activity tolerance, decreased balance, decreased endurance, decreased knowledge of condition, decreased knowledge of use of DME, decreased mobility, difficulty walking, decreased ROM, decreased strength, hypomobility, increased edema, impaired flexibility, postural dysfunction, pain, and chronic , progressive limb swelling.   ACTIVITY LIMITATIONS: Basic and instrumental ADLs (functional mobility and ambulation, dressing, bathing, fitting preferred shoes and lb clothing, grooming nails, skin care and inspection,home management, cooking, meal prep, shopping, health management, driving, riding). Productive activities (work, caring for others). Leisure pursuits ( spending time with family). Community and social participation (attending church).   PERSONAL FACTORS: Age, Fitness, Past/current experiences, and 3+  comorbidities: kidney disease stages III and I$, CLL, DM, HT, Carotid stenosis, abdominal aortic aneurism are also affecting patient's functional outcome.   REHAB POTENTIAL: Fair with consistent, daily caregiver assistance with all home program components, especially wraps/ garments/ devices. compression. Without caregiver assistance prognosis is poor   EVALUATION COMPLEXITY: High   GOALS: Goals reviewed with patient? Yes  SHORT TERM GOALS: Target date: DC from OT  *** Baseline: Goal status: INITIAL  2.  *** Baseline:  Goal status: INITIAL  3.  *** Baseline:  Goal status: INITIAL  4.  *** Baseline:  Goal status: INITIAL  5.  *** Baseline:  Goal status: INITIAL  6.  *** Baseline:  Goal status: INITIAL  LONG TERM GOALS: Target date: ***  *** Baseline:  Goal status: INITIAL  2.  *** Baseline:  Goal status: INITIAL  3.  *** Baseline:  Goal status: INITIAL  4.  *** Baseline:  Goal status: INITIAL  5.  *** Baseline:  Goal status: INITIAL  6.  *** Baseline:  Goal status: INITIAL   PLAN:  OT FREQUENCY: 1x/week and PRN ( to limit burden of care and support QOL goals)  PT DURATION: 6 weeks and PRN. Provide follow along support as needed  PLANNED INTERVENTIONS: 97110-Therapeutic exercises, 97530- Therapeutic activity, 97535- Self Care, [id number]- Manual therapy, skin care to reduce infection risk, Patient/Family education, Manual lymph drainage, Compression bandaging, DME instructions, and fit with appropriate compression garment/ devices;   Custom-made gradient compression garments and HOS devices may be medically necessary because they are uniquely sized and shaped to fit the exact dimensions of the affected extremities, and to provide appropriate medical grade, graduated compression essential for optimally managing chronic, progressive lymphedema. Multiple custom compression garments are needed to ensure proper hygiene to limit infection risk. Custom  compression garments should be replaced q 3-6 months When worn consistently for optimal lipo-lymphedema self-management over time. HOS devices, medically necessary to limit fibrosis buildup in tissue, should be replaced q 2 years and PRN when worn out.     PLAN FOR NEXT SESSION: complete measurements for BLE, adjustable, knee length Circ Aids Juxtafit Essentials and specify necessary accessories   Zebedee Dec, MS, OTR/L, CLT-LANA 05/14/24 3:20 PM

## 2024-05-16 ENCOUNTER — Inpatient Hospital Stay: Attending: Oncology

## 2024-05-16 ENCOUNTER — Encounter: Payer: Self-pay | Admitting: Oncology

## 2024-05-16 ENCOUNTER — Inpatient Hospital Stay (HOSPITAL_BASED_OUTPATIENT_CLINIC_OR_DEPARTMENT_OTHER): Admitting: Oncology

## 2024-05-16 ENCOUNTER — Encounter: Payer: Self-pay | Admitting: Urology

## 2024-05-16 VITALS — BP 132/52 | HR 59 | Temp 97.8°F | Resp 18 | Ht 66.0 in | Wt 187.0 lb

## 2024-05-16 DIAGNOSIS — N1832 Chronic kidney disease, stage 3b: Secondary | ICD-10-CM | POA: Insufficient documentation

## 2024-05-16 DIAGNOSIS — I129 Hypertensive chronic kidney disease with stage 1 through stage 4 chronic kidney disease, or unspecified chronic kidney disease: Secondary | ICD-10-CM | POA: Insufficient documentation

## 2024-05-16 DIAGNOSIS — D631 Anemia in chronic kidney disease: Secondary | ICD-10-CM | POA: Diagnosis present

## 2024-05-16 DIAGNOSIS — D696 Thrombocytopenia, unspecified: Secondary | ICD-10-CM

## 2024-05-16 DIAGNOSIS — N189 Chronic kidney disease, unspecified: Secondary | ICD-10-CM | POA: Diagnosis not present

## 2024-05-16 DIAGNOSIS — C9110 Chronic lymphocytic leukemia of B-cell type not having achieved remission: Secondary | ICD-10-CM | POA: Insufficient documentation

## 2024-05-16 LAB — CMP (CANCER CENTER ONLY)
ALT: 18 U/L (ref 0–44)
AST: 17 U/L (ref 15–41)
Albumin: 2.5 g/dL — ABNORMAL LOW (ref 3.5–5.0)
Alkaline Phosphatase: 104 U/L (ref 38–126)
Anion gap: 8 (ref 5–15)
BUN: 69 mg/dL — ABNORMAL HIGH (ref 8–23)
CO2: 21 mmol/L — ABNORMAL LOW (ref 22–32)
Calcium: 7.6 mg/dL — ABNORMAL LOW (ref 8.9–10.3)
Chloride: 108 mmol/L (ref 98–111)
Creatinine: 4.24 mg/dL — ABNORMAL HIGH (ref 0.61–1.24)
GFR, Estimated: 13 mL/min — ABNORMAL LOW (ref >60)
Glucose, Bld: 159 mg/dL — ABNORMAL HIGH (ref 70–99)
Potassium: 4.1 mmol/L (ref 3.5–5.1)
Sodium: 137 mmol/L (ref 135–145)
Total Bilirubin: 0.9 mg/dL (ref 0.0–1.2)
Total Protein: 4.7 g/dL — ABNORMAL LOW (ref 6.5–8.1)

## 2024-05-16 LAB — CBC WITH DIFFERENTIAL (CANCER CENTER ONLY)
Abs Immature Granulocytes: 0.03 K/uL (ref 0.00–0.07)
Basophils Absolute: 0 K/uL (ref 0.0–0.1)
Basophils Relative: 0 %
Eosinophils Absolute: 0.1 K/uL (ref 0.0–0.5)
Eosinophils Relative: 1 %
HCT: 19.1 % — ABNORMAL LOW (ref 39.0–52.0)
Hemoglobin: 6.2 g/dL — CL (ref 13.0–17.0)
Immature Granulocytes: 0 %
Lymphocytes Relative: 82 %
Lymphs Abs: 9.3 K/uL — ABNORMAL HIGH (ref 0.7–4.0)
MCH: 33.3 pg (ref 26.0–34.0)
MCHC: 32.5 g/dL (ref 30.0–36.0)
MCV: 102.7 fL — ABNORMAL HIGH (ref 80.0–100.0)
Monocytes Absolute: 0.3 K/uL (ref 0.1–1.0)
Monocytes Relative: 2 %
Neutro Abs: 1.7 K/uL (ref 1.7–7.7)
Neutrophils Relative %: 15 %
Platelet Count: 30 K/uL — ABNORMAL LOW (ref 150–400)
RBC: 1.86 MIL/uL — ABNORMAL LOW (ref 4.22–5.81)
RDW: 19.7 % — ABNORMAL HIGH (ref 11.5–15.5)
WBC Count: 11.4 K/uL — ABNORMAL HIGH (ref 4.0–10.5)
nRBC: 0 % (ref 0.0–0.2)

## 2024-05-16 LAB — PREPARE RBC (CROSSMATCH)

## 2024-05-16 LAB — SAMPLE TO BLOOD BANK

## 2024-05-16 NOTE — Progress Notes (Unsigned)
 Patient is doing ok, just wondering what the plan is from here?

## 2024-05-16 NOTE — Progress Notes (Unsigned)
 [facility]  [phone] [phone]  ID: Patrick PARAS Lacombe Jr. OB: Oct 15, 1933  MR#: [id number]  RDW#:[id number]  Patient Care Team: Auston Reyes BIRCH, MD as PCP - General (Internal Medicine) [name], MD as Consulting Physician (Oncology)  CHIEF COMPLAINT: CLL, anemia/thrombocytopenia.  INTERVAL HISTORY: Patient returns to clinic today for repeat laboratory, further evaluation, and consideration of blood transfusion.  He continues to have chronic weakness and fatigue.  Continues to have easy bruising.  His edema has improved after nephrology adjusted medications and he has added compression stockings.  He is tolerating Imbruvica  without significant side effects.  He denies any recent fevers or illnesses.  He has no neurologic complaints.  He has a fair appetite.  He denies any chest pain, shortness of breath, cough, or hemoptysis.  He denies any nausea, vomiting, constipation, or diarrhea. He has no urinary complaints.  Patient offers no further specific complaints today.  REVIEW OF SYSTEMS:   Review of Systems  Constitutional:  Positive for malaise/fatigue. Negative for diaphoresis, fever and weight loss.  Respiratory: Negative.  Negative for cough and shortness of breath.   Cardiovascular: Negative.  Negative for chest pain and leg swelling.  Gastrointestinal: Negative.  Negative for abdominal pain, blood in stool and melena.  Genitourinary: Negative.  Negative for dysuria.  Musculoskeletal: Negative.  Negative for back pain.  Skin: Negative.  Negative for rash.  Neurological:  Positive for weakness. Negative for dizziness, sensory change, focal weakness and headaches.  Endo/Heme/Allergies:  Bruises/bleeds easily.  Psychiatric/Behavioral: Negative.  The patient is not nervous/anxious.     As per HPI. Otherwise, a complete review of systems is negative.  PAST MEDICAL HISTORY: Past Medical History:  Diagnosis Date   Anxiety    CKD  (chronic kidney disease), stage III (HCC)    CLL (chronic lymphocytic leukemia) (HCC)    Diabetes mellitus without complication (HCC)    Diverticulitis 04/09/2017   History of kidney stones    HLD (hyperlipidemia)    HTN (hypertension)    PKD (polycystic kidney disease)    Sleep apnea    Uncontrolled type 2 diabetes mellitus with hyperglycemia (HCC) 06/28/2018    PAST SURGICAL HISTORY: Past Surgical History:  Procedure Laterality Date   CARDIAC CATHETERIZATION  12/1987   CENTRAL LINE INSERTION N/A 04/11/2017   Procedure: Central Line Insertion;  Surgeon: Jama Cordella MATSU, MD;  Location: [facility];  Service: Cardiovascular;  Laterality: N/A;   CERVICAL FUSION     CHOLECYSTECTOMY     CYSTOSCOPY W/ RETROGRADES Left 05/12/2017   Procedure: CYSTOSCOPY WITH RETROGRADE PYELOGRAM;  Surgeon: Chauncey Redell Agent, MD;  Location: [facility];  Service: Urology;  Laterality: Left;   CYSTOSCOPY W/ URETERAL STENT PLACEMENT Left 05/12/2017   Procedure: CYSTOSCOPY WITH STENT REMOVAL;  Surgeon: Chauncey Redell Agent, MD;  Location: [facility];  Service: Urology;  Laterality: Left;   CYSTOSCOPY WITH STENT PLACEMENT Left 04/11/2017   Procedure: CYSTOSCOPY WITH STENT PLACEMENT;  Surgeon: Nieves Cough, MD;  Location: [facility];  Service: Urology;  Laterality: Left;   URETEROSCOPY  05/12/2017   Procedure: URETEROSCOPY;  Surgeon: Chauncey Redell Agent, MD;  Location: [facility];  Service: Urology;;    FAMILY HISTORY: Reported history of ovarian and lung cancer. Diabetes, hypertension.     ADVANCED DIRECTIVES:    HEALTH MAINTENANCE: Social History   Tobacco Use   Smoking status: Former    Types: Cigarettes    Passive exposure: Past   Smokeless tobacco: Never   Tobacco comments:  quit in Feb of 1997  Vaping Use   Vaping status: Never Used  Substance Use Topics   Alcohol use: No   Drug use: No     Colonoscopy:  PAP:  Bone density:  Lipid panel:  Allergies  Allergen Reactions    Celecoxib Other (See Comments)    Increased Blood Pressure   Chlorpromazine Nausea Only   Iodinated Contrast Media Other (See Comments)    Stage 3 kidney disease, told IV dye would be bad for him   Prednisone Other (See Comments)    Diabetic   Amoxicillin-Pot Clavulanate Rash    Has patient had a PCN reaction causing immediate rash, facial/tongue/throat swelling, SOB or lightheadedness with hypotension: No Has patient had a PCN reaction causing severe rash involving mucus membranes or skin necrosis: No Has patient had a PCN reaction that required hospitalization: No Has patient had a PCN reaction occurring within the last 10 years: Yes If all of the above answers are NO, then may proceed with Cephalosporin use.    Penicillin V Potassium Rash    Current Outpatient Medications  Medication Sig Dispense Refill   Aflibercept 2 MG/0.05ML SOLN Inject 1 Dose into the eye as directed. One injection into left eye every 8-9 weeks     atorvastatin  (LIPITOR) 10 MG tablet Take 10 mg by mouth at bedtime.      BD PEN NEEDLE NANO 2ND GEN 32G X 4 MM MISC USE AS DIRECTED ONCE A DAY     carvedilol  (COREG ) 12.5 MG tablet Take 12.5 mg by mouth 2 (two) times daily with a meal.     Cholecalciferol (VITAMIN D -3) 5000 units TABS Take 5,000 Units by mouth at bedtime.      clonazePAM  (KLONOPIN ) 1 MG tablet Take 1 mg by mouth at bedtime.      cyanocobalamin 500 MCG tablet Take 1,000 mcg by mouth at bedtime.      diphenoxylate -atropine  (LOMOTIL ) 2.5-0.025 MG tablet Take 1 tablet by mouth 4 (four) times daily as needed for diarrhea or loose stools. 60 tablet 1   empagliflozin (JARDIANCE) 10 MG TABS tablet 10 mg daily.     epoetin  alfa-epbx (RETACRIT ) 40000 UNIT/ML injection Inject 40,000 Units into the skin every 6 (six) weeks. Depending on lab results (if needed)     fexofenadine (ALLEGRA) 180 MG tablet Take 180 mg by mouth daily as needed for allergies.     finasteride  (PROSCAR ) 5 MG tablet Take 1 tablet (5 mg  total) by mouth daily. 90 tablet 3   folic acid  (FOLVITE ) 400 MCG tablet Take 400 mcg by mouth daily.     ibrutinib  (IMBRUVICA ) 420 MG tablet Take 1 tablet (420 mg total) by mouth daily. Take with a glass of water. 28 tablet 1   insulin  glargine (LANTUS ) 100 UNIT/ML injection Inject 20-30 Units into the skin every morning. Takes Lantus  20 units if CBG less than 120 mg/dl or Lantus  30 units if CBG is over 120 mg/dl     Multiple Vitamins-Minerals (PRESERVISION AREDS 2 PO) Take 1 capsule by mouth 2 (two) times daily.      prednisoLONE acetate (PRED FORTE) 1 % ophthalmic suspension Place 1 drop into the right eye 4 (four) times daily.     sertraline (ZOLOFT) 100 MG tablet Take 100 mg by mouth daily.     tamsulosin  (FLOMAX ) 0.4 MG CAPS capsule TAKE 1 CAPSULE(0.4 MG) BY MOUTH DAILY AFTER SUPPER 90 capsule 3   telmisartan (MICARDIS) 40 MG tablet Take 40 mg by mouth  daily.     torsemide (DEMADEX) 10 MG tablet Take by mouth.     triamcinolone (NASACORT) 55 MCG/ACT AERO nasal inhaler Place 2 sprays into the nose 2 (two) times daily as needed (allergies).      No current facility-administered medications for this visit.   Facility-Administered Medications Ordered in Other Visits  Medication Dose Route Frequency Provider Last Rate Last Admin   0.9 %  sodium chloride  infusion (Manually program via Guardrails IV Fluids)  250 mL Intravenous Continuous Alroy Portela J, MD 10 mL/hr at 05/17/24 1015 100 mL at 05/17/24 1015   acetaminophen  (TYLENOL ) tablet 650 mg  650 mg Oral Once Kyen Taite J, MD       epoetin  alfa-epbx (RETACRIT ) injection 40,000 Units  40,000 Units Subcutaneous Once Bernadette Armijo J, MD       furosemide  (LASIX ) injection 20 mg  20 mg Intravenous Once [name], MD        OBJECTIVE: Vitals:   05/16/24 1100  BP: (!) 132/52  Pulse: (!) 59  Resp: 18  Temp: 97.8 F (36.6 C)  SpO2: 99%       Body mass index is 30.18 kg/m.    ECOG FS:1 - Symptomatic but completely  ambulatory  General: Well-developed, well-nourished, no acute distress. Eyes: Pink conjunctiva, anicteric sclera. HEENT: Normocephalic, moist mucous membranes. Lungs: No audible wheezing or coughing. Heart: Regular rate and rhythm. Abdomen: Soft, nontender, no obvious distention. Musculoskeletal: No edema, cyanosis, or clubbing. Neuro: Alert, answering all questions appropriately. Cranial nerves grossly intact. Skin: No rashes or petechiae noted. Psych: Normal affect.  LAB RESULTS:  Lab Results  Component Value Date   NA 137 05/16/2024   K 4.1 05/16/2024   CL 108 05/16/2024   CO2 21 (L) 05/16/2024   GLUCOSE 159 (H) 05/16/2024   BUN 69 (H) 05/16/2024   CREATININE 4.24 (H) 05/16/2024   CALCIUM  7.6 (L) 05/16/2024   PROT 4.7 (L) 05/16/2024   ALBUMIN 2.5 (L) 05/16/2024   AST 17 05/16/2024   ALT 18 05/16/2024   ALKPHOS 104 05/16/2024   BILITOT 0.9 05/16/2024   GFRNONAA 13 (L) 05/16/2024   GFRAA 37 (L) 11/19/2019    Lab Results  Component Value Date   WBC 11.4 (H) 05/16/2024   NEUTROABS 1.7 05/16/2024   HGB 6.2 (LL) 05/16/2024   HCT 19.1 (L) 05/16/2024   MCV 102.7 (H) 05/16/2024   PLT 30 (L) 05/16/2024   Lab Results  Component Value Date   IRON 79 01/11/2024   TIBC 304 01/11/2024   IRONPCTSAT 26 01/11/2024   Lab Results  Component Value Date   FERRITIN 142 01/11/2024     STUDIES: No results found.  ASSESSMENT: CLL, anemia/thrombocytopenia.  PLAN:    CLL: Bone marrow biopsy on January 08, 2024 reported 69% clonal B-cell lymphocytes consistent with a diagnosis of CLL.  Patient does not have any peripheral lymphocytosis.  No other pathology was reported.  It is possible patient's chronic thrombocytopenia, worsening anemia, night sweats are related to his CLL, therefore patient was initiated on dose reduced Imbruvica  280 mg daily in early April 2025.  He increased to 420 mg at the end of May 2025 and is tolerating treatment well.  Patient's total white blood cell  count is now essentially within normal limits at 1.4.  Hemoglobin and platelets remain persistently low.  Return to clinic tomorrow for 2 units packed red blood cells.  Patient would like to continue evaluation and treatment every 2 weeks.   Anemia, unspecified:  Possibly related to progression of CLL, but we do not have a previous bone marrow biopsy for comparison.  Hemoglobin is 6.2 today.  Return to clinic tomorrow for transfusion.  Will also reinitiate 40,000 units Retacrit  every 2 weeks. All blood products should be irradiated. Thrombocytopenia: Chronic and unchanged.  Patient platelet count is 30 today.  Monitor.  Bone marrow biopsy as above.  Previously, patient required multiple transfusions of platelets prior to his oral surgery.  He did not have any excessive bleeding during his surgery.  His count was as high as 95 in June 2016.  He does not require transfusion today.   Lymphadenopathy: Patient's most recent CT scan to evaluate his aortic aneurysm on Mar 05, 2024 did not reveal any obvious lymphadenopathy.   Aortic aneurysm: Appreciate vascular surgery input.  Given patient's poor kidney function, surgical repair is likely not possible.  Patient reports vascular surgery will repeat CT scan in 6 months.   Dental surgery: Successful and resolved. Renal insufficiency: Chronic and unchanged.  Patient's GFR stable at 13.  Nephrology has discussed the possibility of dialysis.  Imbruvica  does not need to be dose reduced in the setting of renal dysfunction. Edema: Improved with interventions from nephrology.  Patient expressed understanding and was in agreement with this plan. He also understands that He can call clinic at any time with any questions, concerns, or complaints.    Cancer Staging  Chronic lymphocytic leukemia (HCC) Staging form: Chronic Lymphocytic Leukemia / Small Lymphocytic Lymphoma, AJCC 8th Edition - Clinical stage from 01/12/2024: Modified Rai Stage IV (Modified Rai risk: High,  Lymphocytosis: Absent, Adenopathy: Absent, Organomegaly: Absent, Anemia: Present, Thrombocytopenia: Present) - Signed by [name], MD on 01/13/2024    Evalene PARAS Jacobo, MD   05/17/2024 10:20 AM

## 2024-05-16 NOTE — Therapy (Signed)
 OUTPATIENT OCCUPATIONAL THERAPY EVALUATION  BILATERAL LOWER EXTREMITY/ LOWER QUADRANT  LYMPHEDEMA   BILATERAL UPPER EXTREMITY/ UPPER QUADRANT  LYMPHEDEMA     Patient Name: Patrick Payne. MRN: [id number] DOB:Sep 29, 1934, 89 y.o., male Today's Date: 05/16/2024  END OF SESSION:   OT End of Session - 05/16/24 0819     Visit Number 1    Number of Visits 6    Date for OT Re-Evaluation 08/12/24    OT Start Time 0810    OT Stop Time 0934    OT Time Calculation (min) 84 min    Activity Tolerance Patient tolerated treatment well;No increased pain    Behavior During Therapy WFL for tasks assessed/performed            Past Medical History:  Diagnosis Date   Anxiety    CKD (chronic kidney disease), stage III (HCC)    CLL (chronic lymphocytic leukemia) (HCC)    Diabetes mellitus without complication (HCC)    Diverticulitis 04/09/2017   History of kidney stones    HLD (hyperlipidemia)    HTN (hypertension)    PKD (polycystic kidney disease)    Sleep apnea    Uncontrolled type 2 diabetes mellitus with hyperglycemia (HCC) 06/28/2018   Past Surgical History:  Procedure Laterality Date   CARDIAC CATHETERIZATION  12/1987   CENTRAL LINE INSERTION N/A 04/11/2017   Procedure: Central Line Insertion;  Surgeon: Jama Cordella MATSU, MD;  Location: [facility];  Service: Cardiovascular;  Laterality: N/A;   CERVICAL FUSION     CHOLECYSTECTOMY     CYSTOSCOPY W/ RETROGRADES Left 05/12/2017   Procedure: CYSTOSCOPY WITH RETROGRADE PYELOGRAM;  Surgeon: Chauncey Redell Agent, MD;  Location: [facility];  Service: Urology;  Laterality: Left;   CYSTOSCOPY W/ URETERAL STENT PLACEMENT Left 05/12/2017   Procedure: CYSTOSCOPY WITH STENT REMOVAL;  Surgeon: Chauncey Redell Agent, MD;  Location: [facility];  Service: Urology;  Laterality: Left;   CYSTOSCOPY WITH STENT PLACEMENT Left 04/11/2017   Procedure: CYSTOSCOPY WITH STENT PLACEMENT;  Surgeon: Nieves Cough, MD;  Location: [facility];  Service:  Urology;  Laterality: Left;   URETEROSCOPY  05/12/2017   Procedure: URETEROSCOPY;  Surgeon: Chauncey Redell Agent, MD;  Location: [facility];  Service: Urology;;   Patient Active Problem List   Diagnosis Date Noted   Edema 05/08/2024   CKD (chronic kidney disease) stage 4, GFR 15-29 ml/min (HCC) 03/12/2024   Arthralgia of right ankle 12/22/2023   Exudative age-related macular degeneration of right eye with active choroidal neovascularization (HCC) 03/21/2023   Preop cardiovascular exam 01/19/2023   Acute renal failure superimposed on stage 3b chronic kidney disease (HCC) 12/14/2022   Community acquired pneumonia of right lower lobe of lung 12/14/2022   Hydroureter on left 12/14/2022   Acute diarrhea 12/14/2022   Thrombocytopenia (HCC) 12/14/2022   Urinary tract disease 09/28/2021   Hypertension 09/28/2021   Chronic kidney disease, stage IV (severe) (HCC) 05/24/2021   Carotid stenosis 10/23/2020   Aortic atherosclerosis (HCC) 11/08/2019   Anemia in chronic kidney disease 11/05/2019   Secondary hyperparathyroidism of renal origin (HCC) 11/05/2019   Benign hypertensive kidney disease with chronic kidney disease 08/05/2019   Nephrolithiasis 08/05/2019   Proteinuria 08/05/2019   GAD (generalized anxiety disorder) 07/03/2018   DM type 2 with diabetic peripheral neuropathy (HCC) 06/28/2018   Polycystic kidney disease - right side 04/10/2017   Flank pain 04/09/2017   Long term current use of insulin  (HCC) 07/16/2014   Insulin  long-term use (HCC) 07/16/2014   Abdominal aortic  aneurysm (AAA) (HCC) 04/17/2014   Chronic kidney disease, stage 3b (HCC) - baseline SCr 2.2-2.5 04/17/2014   Chronic lymphocytic leukemia (HCC) 04/17/2014   Type 2 diabetes mellitus with chronic kidney disease, with long-term current use of insulin  (HCC) 04/17/2014   Essential hypertension 04/17/2014   HLD (hyperlipidemia) 04/17/2014   Obstructive apnea 04/17/2014   AAA (abdominal aortic aneurysm) (HCC) 04/17/2014   CKD  (chronic kidney disease), stage III (HCC) 04/17/2014   CLL (chronic lymphocytic leukemia) (HCC) 04/17/2014   Type 2 diabetes mellitus (HCC) 04/17/2014   H/O malignant neoplasm of skin 06/07/2013    PCP: Reyes JONETTA Costa, MD  REFERRING PROVIDER: Tinnie Dawn, NP  REFERRING DIAG: I89.0  THERAPY DIAG:  Lymphedema, not elsewhere classified  Rationale for Evaluation and Treatment: Rehabilitation  ONSET DATE: ~ 1 year s/p insidious onset  SUBJECTIVE:                                                                                                                                                                                          SUBJECTIVE STATEMENT:[name] is referred to Occupational Therapy by Tinnie KANDICE Dawn, NP, for evaluation and treatment of chronic, multi factorial, bilateral upper and lower extremity swelling. [name] reports that he first  noticed leg and arm swelling and a red discoloration when he started chemo. Pt reports that swelling fluctuates some, and typically gets better over night. He wears off the shelf  compression socks. He finally found some he can put on and take off without help. Pt recently elbow length Tubigrip sleeves and off the shelf compression gloves for his hands from OT. I think they have helped. Pt reports he had fluid leaking from the back of his left leg when swelling was at it;['s worst. He tells me he lost 5 pounds after taking his diuretic. Pt's goals for OT are to reduce and control swelling in his arms and legs and limit infection risk. Pt and his spouse express concern about spending so much time at medical appointments.  PERTINENT HISTORY:   Edema 05/08/2024   CKD (chronic kidney disease) stage 4, GFR 15-29 ml/min (HCC) 03/12/2024   Arthralgia of right ankle 12/22/2023   Exudative age-related macular degeneration of right eye with active choroidal neovascularization (HCC) 03/21/2023   Acute renal failure superimposed on stage 3 b  chronic kidney disease (HCC) 12/14/2022   Community acquired pneumonia of right lower lobe of lung 12/14/2022   Hypertension 09/28/2021   Chronic kidney disease, stage IV (severe) (HCC) 05/24/2021   Carotid stenosis 10/23/2020   Aortic atherosclerosis (HCC) 11/08/2019   Anemia in chronic kidney disease 11/05/2019   Benign  hypertensive kidney disease with chronic kidney disease 08/05/2019   Nephrolithiasis 08/05/2019   Proteinuria 08/05/2019   GAD (generalized anxiety disorder) 07/03/2018   DM type 2 with diabetic peripheral neuropathy (HCC) 06/28/2018   Polycystic kidney disease - right side 04/10/2017   Long term current use of insulin  (HCC) 07/16/2014   Abdominal aortic aneurysm (AAA) (HCC) 04/17/2014   Chronic kidney disease, stage 3 b (HCC) - baseline SCr 2.2-2.5 04/17/2014   Chronic lymphocytic leukemia (HCC) 04/17/2014   Type 2 diabetes mellitus with chronic kidney disease, with long-term current use of insulin  (HCC) 04/17/2014   Essential hypertension 04/17/2014   HLD (hyperlipidemia) 04/17/2014   Obstructive apnea 04/17/2014   AAA (abdominal aortic aneurysm) (HCC) 04/17/2014   CKD (chronic kidney disease), stage III (HCC) 04/17/2014   CLL (chronic lymphocytic leukemia) (HCC) 04/17/2014   Type 2 diabetes mellitus (HCC) 04/17/2014   H/O malignant neoplasm of skin 06/07/2013   PAIN:  Are you having pain? Yes: NPRS scale: not rated numerically bilateral legs, bilateral arms Pain location: Bilateral arms and legs Pain description: heaviness, fullness Aggravating factors: standing, walking Relieving factors: elevation, rubbing  PRECAUTIONS: Fall and Other: LYMPHEDEMA PRECAUTIONS: THYROID, KIDNEY, CARDIAC, PULMONARY, DIABETES SKIN; Fall risk, increased cellulitis  risk  RED FLAGS: Abdominal aneurysm: Yes:     WEIGHT BEARING RESTRICTIONS: No  FALLS:  Has patient fallen in last 6 months? No  LIVING ENVIRONMENT: Lives with: lives with their spouse Lives in:  House/apartment Stairs: No;  Has following equipment at home: Single point cane, Environmental consultant - 2 wheeled, Wheelchair (manual), Tour manager, Grab bars, and hand held shower  OCCUPATION: retired Comptroller  LEISURE: family time  HAND DOMINANCE: right   PRIOR LEVEL OF FUNCTION:  Modified independence with household mobility with device, modified independence with basic ADLs, mod I with instrumental ADLs  PATIENT GOALS: reduce limb swelling and associated pain and keep it from getting worse. Limit infection risk   OBJECTIVE: Note: Objective measures were completed at Evaluation unless otherwise noted.  COGNITION:  Overall cognitive status: Within functional limits for tasks assessed   OBSERVATIONS / OTHER ASSESSMENTS:   POSTURE: head forward , flexible kyphosis  LE ROM: limited at hips, knees, ankles,  feet, and hands due to girth at joints and skin approximation due to swelling  LYMPHEDEMA ASSESSMENTS:   SURGERY TYPE/DATE: N/A  NUMBER OF LYMPH NODES REMOVED: N/A  CHEMOTHERAPY: Chemo for CLL  RADIATION: N/a  HORMONE TREATMENT: N/A  INFECTIONS: Hx of infection   LYMPHEDEMA LIFE IMPACT SCALE (LLIS): 29.41% (The extent to which lymphedema-related problems affected your life in the past week)  BLE COMPARATIVE LIMB VOLUMETRICS TBA OT Rx 1  LANDMARK RIGHT   R LEG (A-D) N/A  R THIGH (E-G) ml  R FULL LIMB (A-G) ml  Limb Volume differential (LVD)  %  Volume change since last measured %  Volume change since commencing CDT V  (Blank rows = not tested)  LANDMARK LEFT    L LEG (A-D) N/A  L THIGH (E-G) ml  L FULL LIMB (A-G) ml  Limb Volume differential (LVD)  %  Volume change since last visit %  Volume change since commencing CDT %  (Blank rows = not tested)       Skin/ Tissue Condition  Skin  Description Hyper-Keratosis Peau d' Orange Shiny Tight Fibrotic/ Indurated Fatty Doughy Cobblestone      x x  x    Skin dry Flaky WNL Macerated   mildly      Color  Redness Varicosities Blanching Hemosiderin Stain Mottled   x    Arms and calves x   Odor Malodorous Yeast Fungal infection  WNL      x   Temperature Warm Cool wnl    x     Pitting Edema   1+ 2+ 3+ 4+ Non-pitting      x      Girth Symmetrical Asymmetrical                   Distribution    L>R toes to groin    Stemmer Sign Positive Negative   +    Lymphorrhea History Of:  Present Absent   x Posterior L leg     Wounds History Of Present Absent Venous Arterial Pressure Sheer     x        Signs of Infection Redness Warmth Erythema Acute Swelling Drainage Borders                    Sensation Light Touch Deep pressure Hypersensitivity   In tact Impaired In tact Impaired Absent Impaired   x   x  x     Nails WNL   Fungus nail dystrophy        Hair Growth Symmetrical Asymmetrical   x    Skin Creases Base of toes  Ankles   Base of Fingers knees       Abdominal pannus Thigh Lobules  Face/neck   x x x                                                                                                                              TREATMENT:  OT EVAL AND TREAT PT AND FAMILY EDU   PATIENT EDUCATION:  Discussed differential diagnoses for various swelling disorders. Provided basic level education regarding lymphatic structure and function, etiology, onset patterns, stages of progression, and prevention to limit infection risk, worsening condition and further functional decline. Pt edu for aught interaction between blood circulatory system and lymphatic circulation.Discussed  impact of gravity and co-morbidities on lymphatic function. Outlined Complete Decongestive Therapy (CDT)  as standard of care and provided in depth information regarding 4 primary components of Intensive and Self Management Phases, including Manual Lymph Drainage (MLD), compression wrapping and garments, skin care, and therapeutic exercise. Dempsey discussion with re need for frequent attendance and high  burden of care when caregiver is needed, impact of co morbidities. We discussed  the chronic, progressive nature of lymphedema and Importance of daily, ongoing LE self-care essential for limiting progression and infection risk.  Person educated: Patient and spouse Education method: Explanation, Demonstration, and Handouts Education comprehension: verbalized understanding, returned demonstration, verbal cues required, and needs further education  LYMPHEDEMA SELF-CARE HOME PROGRAM: BLE lymphatic pumping there ex- 1 set of 10 each element, in order. Hold 5. 2 x daily  2. Daily, short stretch, knee length, multilayer compression bandages to one leg at a time during Intensive  Phase CDT  3. Fit with adjustable, knee length, Velcro wrap style compression leggings. Determine if Pt fits into ready made or custom size at initial OT Rx session.  CircAid Juxtafit Essentials are gauge-able for compression class, while similar products are not. Accessories should include 4 pairs of black compressive knee high socks as liners (4 per leg for optimal hygiene (due to lymphorrhea), 2 pac bands for each foot for distal compression when not wearing shoes, 1 black cover up per legging (2 total cover ups).   Custom-made gradient compression garments and HOS devices are medically necessary because they are uniquely sized and shaped to fit the exact dimensions of the affected extremities, and to provide appropriate medical grade, graduated compression essential for optimally managing chronic, progressive lymphedema. Multiple custom compression garments are needed to ensure proper hygiene due to elevated infection risk. Compression garments should be replaced q 3-6 months when worn consistently for optimal lymphedema control over time. HOS devices, medically necessary to limit fibrosis buildup in tissue, should be replaced q 2 years and PRN when worn out.    4. During self-Management Phase fit with appropriate medical grade,  gradient compression stockings paired with compression Capri style leggings for necessary compression and containment to control swelling and reduce pain.  5. Daily skin care with low ph lotion matching skin ph to reduce infection risk.  6.  Daily simple self MLD to limit progression.  7. Leg elevation when seated    ASSESSMENT  CLINICAL IMPRESSION: Patrick Strawderman Jr. Is an 89 yo male presenting with chronic, fluctuating, BLE/BLQ and BUE/BUQ lymphedema 2/2 suspected systemic overload of the lymphatic system by due to impaired kidney function, and suspected impairment of lymphatic function resulting from chemotherapy. Lymphedema limits functional ambulation and mobility, basic and instrumental ADLs performance, leisure pursuits, productive activities , social participation, and psychosocial concerns, including body image, embarrassment, decreased quality of life.  Pt also presents with a large constellation of factors contributing to swelling in all 4 limbs, which is atypical for lymphedema.  These factors include renal insufficiency, hypertension, and sleep apnea.   Chronic, progressive lymphedema limits functional performance in all occupational domains, including basic and instrumental ADLs, functional ambulation and mobility,  leisure pursuits, productive activities , social participation, and psychosocial concerns, including body image, embarrassment, and quality of life. Pt will benefit from skilled Occupational Therapy for modified Complete Decongestive Therapy (CDT), the gold standard for lymphedema care.  Modified Intensive Phase CDT will include fitting with adjustable. Knee length, BLE compression garment alternatives that are easy to don and doff. It will include manual lymphatic drainage (MLD) to reduce lymph volume and stimulate lymphangiomotoricity. Pt will benefit from skin care during MLD and multiple times during the day to limit infection risk. Pt will learn lymphatic pumping ther  ex. Pt should remain as active as possible, but due to elevated fall risk he should perform lymphatic ther ex when seated or laying down.   Chronic , progressive, BLE/BLQ  and BUE/BUQ lymphedema has a high burden of care. Mr Strauch needs caregiver assistance to don and doff adjustable, knee length, Velcro closure style, compression leggings, which are an adjustable alternative to elastic compression stockings. These should be worn full time during waking hours and, at present, removed during HOS. CircAid Juxtafit Essentials, size TBD,  should be worn over soft cotton liners to protect skin. Medical grade, gradient compression should be calibrated at compression class  1 (18-21 mmHg).  Foot pieces can be removed to fit shoes and socks  when going out. Each CircAid leggings should be worn with a well fitting cover to limit skin injury and to limit Velcro contact with clothing.  Pt should also be fitted with comfortable, light, but effective compression garments for arms and hands that limit UE function as little as possible. Optimal garments TBD.  Skilled OT will focus on managing symptoms and progression of chronic lymphedema. The goals of treatment are to decrease swelling and associated pain/ discomfort, reduce infection risk, improve functional performance and mobility, improve skin integrity,  and improving quality of life. Without skilled OT for CDT infection risk will increase, lymphedema will progress, and further functional decline is expected.  OBJECTIVE IMPAIRMENTS: Abnormal gait, cardiopulmonary status limiting activity, decreased activity tolerance, decreased balance, decreased endurance, decreased knowledge of condition, decreased knowledge of use of DME, decreased mobility, difficulty walking, decreased ROM, decreased strength, hypomobility, increased edema, impaired flexibility, postural dysfunction, pain, and chronic , progressive limb swelling.   ACTIVITY LIMITATIONS: Basic and instrumental  ADLs (functional mobility and ambulation, dressing, bathing, fitting preferred shoes and lb clothing, grooming nails, skin care and inspection,home management, cooking, meal prep, shopping, health management, driving, riding). Productive activities (work, caring for others). Leisure pursuits ( spending time with family). Community and social participation (attending church).   PERSONAL FACTORS: Age, Fitness, Past/current experiences, and 3+ comorbidities: kidney disease stages III and I$, CLL, DM, HT, Carotid stenosis, abdominal aortic aneurism are also affecting patient's functional outcome.   REHAB POTENTIAL: Fair with consistent, daily caregiver assistance with all home program components, especially wraps/ garments/ devices. compression. Without caregiver assistance prognosis is poor   EVALUATION COMPLEXITY: High   GOALS: Goals reviewed with patient? Yes Target date: DC from OT  SHORT TERM GOALS: Target date: 4th OT Rx visit   Pt and caregiver will demonstrate understanding of lymphedema precautions and prevention strategies with modified independence using a printed reference to identify at least 5 precautions and discussing how s/he may implement them into daily life to reduce risk of progression with extra time. Baseline:Max A Goal status: INITIAL  2.  Pt will be able to apply multilayer, thigh length, gradient, compression wraps to one leg at a time from toes to groin with max caregiver assist to decrease limb volume, to limit infection risk, and to limit lymphedema progression.  Baseline: Dependent Goal status: INITIAL  LONG TERM GOALS: Target date: 08/11/24  1.Given this patient's Intake score of 29.41% % on the Lymphedema Life Impact Scale (LLIS), patient will experience a reduction of at least 10 points in his perceived level of functional impairment resulting from lymphedema to improve functional performance and quality of life (QOL). Baseline: 29.41 % Goal status:  INITIAL  2.  Pt will achieve at least a 10% volume reduction in B legs to return limbs to typical size and shape, to limit infection risk and LE progression, to decrease pain, to improve function. Baseline: Dependent Goal status: INITIAL  3. Pt will achieve a reduction of lymphedema related pain/ discomfort in arms and legs of 0/10 by DC to improve QOL.  4.  Pt will obtain appropriate compression garments/devices and be able to don and doff them with minimal assistance to optimize limb volume reductions and limit LE progression over time. Baseline: Dependent Goal status: INITIAL  5. During modified Intensive phase CDT Pt will achieve at least 85% compliance with all adapted lymphedema self-care home program components, including daily skin care, compression wraps and /or garments, simple self MLD and lymphatic pumping therex to habituate LE self  care protocol  into ADLs for optimal LE self-management over time. Baseline: Dependent Goal status: INITIAL   PLAN:  OT FREQUENCY: 1x/week and PRN ( to limit burden of care and support QOL goals)  PT DURATION: 6 weeks and PRN. Provide follow along support as needed  PLANNED INTERVENTIONS: 97110-Therapeutic exercises (lymphatic pumping BUE and BLE) , 97530- Therapeutic activity, 97535- Self Care, [id number]- Manual therapy (MLD, fibrosis techniques), skin care to reduce infection risk, limit fibrosis,  Patient/Family education, effective compression that is comfortable and caregiver friendly and easy to don and doff, consider compression bandaging on limited basis, DME instructions, educate Pt and caregiver re all lymphedema self-care components and support coping strategies  Custom-made gradient compression garments and HOS devices may be medically necessary because they are uniquely sized and shaped to fit the exact dimensions of the affected extremities, and to provide appropriate medical grade, graduated compression essential for optimally managing  chronic, progressive lymphedema. Multiple custom compression garments are needed to ensure proper hygiene to limit infection risk. Custom compression garments should be replaced q 3-6 months When worn consistently for optimal lipo-lymphedema self-management over time. HOS devices, medically necessary to limit fibrosis buildup in tissue, should be replaced q 2 years and PRN when worn out.     PLAN FOR NEXT SESSION:  complete BLE limb volumetrics; complete measurements for BLE, adjustable, knee length Circ Aids Juxtafit Essentials and accessories Submit to DME vendor for insurance pre authorization Pt and family edu for LE self care   Zebedee Dec, MS, OTR/L, CLT-LANA 05/16/24 8:21 AM

## 2024-05-17 ENCOUNTER — Inpatient Hospital Stay

## 2024-05-17 ENCOUNTER — Encounter

## 2024-05-17 ENCOUNTER — Ambulatory Visit

## 2024-05-17 ENCOUNTER — Encounter: Payer: Self-pay | Admitting: Oncology

## 2024-05-17 VITALS — BP 145/52 | HR 57 | Temp 96.5°F | Resp 18

## 2024-05-17 DIAGNOSIS — I129 Hypertensive chronic kidney disease with stage 1 through stage 4 chronic kidney disease, or unspecified chronic kidney disease: Secondary | ICD-10-CM | POA: Diagnosis not present

## 2024-05-17 DIAGNOSIS — C9110 Chronic lymphocytic leukemia of B-cell type not having achieved remission: Secondary | ICD-10-CM

## 2024-05-17 DIAGNOSIS — D631 Anemia in chronic kidney disease: Secondary | ICD-10-CM

## 2024-05-17 MED ORDER — SODIUM CHLORIDE 0.9% IV SOLUTION
250.0000 mL | INTRAVENOUS | Status: DC
  Administered 2024-05-17: 100 mL via INTRAVENOUS
  Filled 2024-05-17: qty 250

## 2024-05-17 MED ORDER — ACETAMINOPHEN 325 MG PO TABS
650.0000 mg | ORAL_TABLET | Freq: Once | ORAL | Status: AC
  Administered 2024-05-17: 650 mg via ORAL
  Filled 2024-05-17: qty 2

## 2024-05-17 MED ORDER — EPOETIN ALFA-EPBX 40000 UNIT/ML IJ SOLN
40000.0000 [IU] | Freq: Once | INTRAMUSCULAR | Status: AC
  Administered 2024-05-17: 40000 [IU] via SUBCUTANEOUS
  Filled 2024-05-17: qty 1

## 2024-05-17 MED ORDER — FUROSEMIDE 10 MG/ML IJ SOLN
20.0000 mg | Freq: Once | INTRAMUSCULAR | Status: AC
  Administered 2024-05-17: 20 mg via INTRAVENOUS
  Filled 2024-05-17: qty 2

## 2024-05-18 LAB — TYPE AND SCREEN
ABO/RH(D): A POS
Antibody Screen: NEGATIVE
Unit division: 0
Unit division: 0

## 2024-05-18 LAB — BPAM RBC
Blood Product Expiration Date: 202508272359
Blood Product Expiration Date: 202508272359
ISSUE DATE / TIME: 202508081034
ISSUE DATE / TIME: 202508081238
Unit Type and Rh: 600
Unit Type and Rh: 600

## 2024-05-22 ENCOUNTER — Ambulatory Visit: Admitting: Occupational Therapy

## 2024-05-22 ENCOUNTER — Encounter: Payer: Self-pay | Admitting: Occupational Therapy

## 2024-05-22 DIAGNOSIS — I890 Lymphedema, not elsewhere classified: Secondary | ICD-10-CM

## 2024-05-22 NOTE — Therapy (Signed)
 OUTPATIENT OCCUPATIONAL THERAPY TREATMENT NOTE  BILATERAL LOWER EXTREMITY/ LOWER QUADRANT  LYMPHEDEMA   BILATERAL UPPER EXTREMITY/ UPPER QUADRANT  LYMPHEDEMA     Patient Name: Patrick Payne. MRN: [id number] DOB:1934-01-19, 89 y.o., male Today's Date: 05/22/2024  END OF SESSION:   OT End of Session - 05/22/24 0912     Visit Number 2    Number of Visits 6    Date for OT Re-Evaluation 08/12/24    OT Start Time 0906    Activity Tolerance Patient tolerated treatment well;No increased pain    Behavior During Therapy WFL for tasks assessed/performed            Past Medical History:  Diagnosis Date   Anxiety    CKD (chronic kidney disease), stage III (HCC)    CLL (chronic lymphocytic leukemia) (HCC)    Diabetes mellitus without complication (HCC)    Diverticulitis 04/09/2017   History of kidney stones    HLD (hyperlipidemia)    HTN (hypertension)    PKD (polycystic kidney disease)    Sleep apnea    Uncontrolled type 2 diabetes mellitus with hyperglycemia (HCC) 06/28/2018   Past Surgical History:  Procedure Laterality Date   CARDIAC CATHETERIZATION  12/1987   CENTRAL LINE INSERTION N/A 04/11/2017   Procedure: Central Line Insertion;  Surgeon: Jama Cordella MATSU, MD;  Location: [facility];  Service: Cardiovascular;  Laterality: N/A;   CERVICAL FUSION     CHOLECYSTECTOMY     CYSTOSCOPY W/ RETROGRADES Left 05/12/2017   Procedure: CYSTOSCOPY WITH RETROGRADE PYELOGRAM;  Surgeon: Chauncey Redell Agent, MD;  Location: [facility];  Service: Urology;  Laterality: Left;   CYSTOSCOPY W/ URETERAL STENT PLACEMENT Left 05/12/2017   Procedure: CYSTOSCOPY WITH STENT REMOVAL;  Surgeon: Chauncey Redell Agent, MD;  Location: [facility];  Service: Urology;  Laterality: Left;   CYSTOSCOPY WITH STENT PLACEMENT Left 04/11/2017   Procedure: CYSTOSCOPY WITH STENT PLACEMENT;  Surgeon: Nieves Cough, MD;  Location: [facility];  Service: Urology;  Laterality: Left;   URETEROSCOPY  05/12/2017    Procedure: URETEROSCOPY;  Surgeon: Chauncey Redell Agent, MD;  Location: [facility];  Service: Urology;;   Patient Active Problem List   Diagnosis Date Noted   Edema 05/08/2024   CKD (chronic kidney disease) stage 4, GFR 15-29 ml/min (HCC) 03/12/2024   Arthralgia of right ankle 12/22/2023   Exudative age-related macular degeneration of right eye with active choroidal neovascularization (HCC) 03/21/2023   Preop cardiovascular exam 01/19/2023   Acute renal failure superimposed on stage 3b chronic kidney disease (HCC) 12/14/2022   Community acquired pneumonia of right lower lobe of lung 12/14/2022   Hydroureter on left 12/14/2022   Acute diarrhea 12/14/2022   Thrombocytopenia (HCC) 12/14/2022   Urinary tract disease 09/28/2021   Hypertension 09/28/2021   Chronic kidney disease, stage IV (severe) (HCC) 05/24/2021   Carotid stenosis 10/23/2020   Aortic atherosclerosis (HCC) 11/08/2019   Anemia in chronic kidney disease 11/05/2019   Secondary hyperparathyroidism of renal origin (HCC) 11/05/2019   Benign hypertensive kidney disease with chronic kidney disease 08/05/2019   Nephrolithiasis 08/05/2019   Proteinuria 08/05/2019   GAD (generalized anxiety disorder) 07/03/2018   DM type 2 with diabetic peripheral neuropathy (HCC) 06/28/2018   Polycystic kidney disease - right side 04/10/2017   Flank pain 04/09/2017   Long term current use of insulin  (HCC) 07/16/2014   Insulin  long-term use (HCC) 07/16/2014   Abdominal aortic aneurysm (AAA) (HCC) 04/17/2014   Chronic kidney disease, stage 3b (HCC) - baseline SCr  2.2-2.5 04/17/2014   Chronic lymphocytic leukemia (HCC) 04/17/2014   Type 2 diabetes mellitus with chronic kidney disease, with long-term current use of insulin  (HCC) 04/17/2014   Essential hypertension 04/17/2014   HLD (hyperlipidemia) 04/17/2014   Obstructive apnea 04/17/2014   AAA (abdominal aortic aneurysm) (HCC) 04/17/2014   CKD (chronic kidney disease), stage III (HCC) 04/17/2014    CLL (chronic lymphocytic leukemia) (HCC) 04/17/2014   Type 2 diabetes mellitus (HCC) 04/17/2014   H/O malignant neoplasm of skin 06/07/2013    PCP: Reyes JONETTA Costa, MD  REFERRING PROVIDER: Tinnie Dawn, NP  REFERRING DIAG: I89.0  THERAPY DIAG:  Lymphedema, not elsewhere classified  Rationale for Evaluation and Treatment: Rehabilitation  ONSET DATE: ~ 1 year s/p insidious onset  SUBJECTIVE:                                                                                                                                                                                          SUBJECTIVE STATEMENT: [name] presents to OT for initial treatment visit to address bilateral upper and lower extremity lymphedema. He is accompanied by his lovely wife, [name]. Pt reports leg swelling is a little better. Pt states he is using  cream on his legs daily. He denies LE-related pain in arms and legs.   (05/14/24 INITIAL OT EVALUATION:  Pruitt Sautter is referred to Occupational Therapy by Tinnie KANDICE Dawn, NP, for evaluation and treatment of chronic, multi factorial, bilateral upper and lower extremity swelling. [name] reports that he first  noticed leg and arm swelling and a red discoloration when he started chemo. Pt reports that swelling fluctuates some, and typically gets better over night. He wears off the shelf  compression socks. He finally found some he can put on and take off without help. Pt recently elbow length Tubigrip sleeves and off the shelf compression gloves for his hands from OT. I think they have helped. Pt reports he had fluid leaking from the back of his left leg when swelling was at it;['s worst. He tells me he lost 5 pounds after taking his diuretic. Pt's goals for OT are to reduce and control swelling in his arms and legs and limit infection risk. Pt and his spouse express concern about spending so much time at medical appointments.)  PERTINENT HISTORY:   Edema 05/08/2024    CKD (chronic kidney disease) stage 4, GFR 15-29 ml/min (HCC) 03/12/2024   Arthralgia of right ankle 12/22/2023   Exudative age-related macular degeneration of right eye with active choroidal neovascularization (HCC) 03/21/2023   Acute renal failure superimposed on stage 3 b chronic kidney disease (HCC) 12/14/2022   Community  acquired pneumonia of right lower lobe of lung 12/14/2022   Hypertension 09/28/2021   Chronic kidney disease, stage IV (severe) (HCC) 05/24/2021   Carotid stenosis 10/23/2020   Aortic atherosclerosis (HCC) 11/08/2019   Anemia in chronic kidney disease 11/05/2019   Benign hypertensive kidney disease with chronic kidney disease 08/05/2019   Nephrolithiasis 08/05/2019   Proteinuria 08/05/2019   GAD (generalized anxiety disorder) 07/03/2018   DM type 2 with diabetic peripheral neuropathy (HCC) 06/28/2018   Polycystic kidney disease - right side 04/10/2017   Long term current use of insulin  (HCC) 07/16/2014   Abdominal aortic aneurysm (AAA) (HCC) 04/17/2014   Chronic kidney disease, stage 3 b (HCC) - baseline SCr 2.2-2.5 04/17/2014   Chronic lymphocytic leukemia (HCC) 04/17/2014   Type 2 diabetes mellitus with chronic kidney disease, with long-term current use of insulin  (HCC) 04/17/2014   Essential hypertension 04/17/2014   HLD (hyperlipidemia) 04/17/2014   Obstructive apnea 04/17/2014   AAA (abdominal aortic aneurysm) (HCC) 04/17/2014   CKD (chronic kidney disease), stage III (HCC) 04/17/2014   CLL (chronic lymphocytic leukemia) (HCC) 04/17/2014   Type 2 diabetes mellitus (HCC) 04/17/2014   H/O malignant neoplasm of skin 06/07/2013   PAIN:  Are you having pain? Yes: NPRS scale: not rated numerically bilateral legs, bilateral arms Pain location: Bilateral arms and legs Pain description: heaviness, fullness Aggravating factors: standing, walking Relieving factors: elevation, rubbing  PRECAUTIONS: Fall and Other: LYMPHEDEMA PRECAUTIONS: THYROID, KIDNEY, CARDIAC,  PULMONARY, DIABETES SKIN; Fall risk, increased cellulitis  risk  RED FLAGS: Abdominal aneurysm: Yes:     WEIGHT BEARING RESTRICTIONS: No  FALLS:  Has patient fallen in last 6 months? No  LIVING ENVIRONMENT: Lives with: lives with their spouse Lives in: House/apartment Stairs: No;  Has following equipment at home: Single point cane, Environmental consultant - 2 wheeled, Wheelchair (manual), Tour manager, Grab bars, and hand held shower  OCCUPATION: retired Comptroller  LEISURE: family time  HAND DOMINANCE: right   PRIOR LEVEL OF FUNCTION:  Modified independence with household mobility with device, modified independence with basic ADLs, mod I with instrumental ADLs  PATIENT GOALS: reduce limb swelling and associated pain and keep it from getting worse. Limit infection risk   OBJECTIVE: Note: Objective measures were completed at Evaluation unless otherwise noted.  COGNITION:  Overall cognitive status: Within functional limits for tasks assessed   OBSERVATIONS / OTHER ASSESSMENTS:   POSTURE: head forward , flexible kyphosis  LE ROM: limited at hips, knees, ankles,  feet, and hands due to girth at joints and skin approximation due to swelling  LYMPHEDEMA ASSESSMENTS:   SURGERY TYPE/DATE: N/A  NUMBER OF LYMPH NODES REMOVED: N/A  CHEMOTHERAPY: Chemo for CLL  RADIATION: N/a  HORMONE TREATMENT: N/A  INFECTIONS: Hx of infection   LYMPHEDEMA LIFE IMPACT SCALE (LLIS): 29.41% (The extent to which lymphedema-related problems affected your life in the past week)  BLE COMPARATIVE LIMB VOLUMETRICS TBA OT Rx 1  LANDMARK RIGHT   R LEG (A-D) N/A  R THIGH (E-G) ml  R FULL LIMB (A-G) ml  Limb Volume differential (LVD)  %  Volume change since last measured %  Volume change since commencing CDT V  (Blank rows = not tested)  LANDMARK LEFT    L LEG (A-D) N/A  L THIGH (E-G) ml  L FULL LIMB (A-G) ml  Limb Volume differential (LVD)  %  Volume change since last visit %  Volume change  since commencing CDT %  (Blank rows = not tested)  Skin/ Tissue Condition  Skin  Description Hyper-Keratosis Peau d' Orange Shiny Tight Fibrotic/ Indurated Fatty Doughy Cobblestone      x x  x    Skin dry Flaky WNL Macerated   mildly      Color Redness Varicosities Blanching Hemosiderin Stain Mottled   x    Arms and calves x   Odor Malodorous Yeast Fungal infection  WNL      x   Temperature Warm Cool wnl    x     Pitting Edema   1+ 2+ 3+ 4+ Non-pitting      x      Girth Symmetrical Asymmetrical                   Distribution    L>R toes to groin    Stemmer Sign Positive Negative   +    Lymphorrhea History Of:  Present Absent   x Posterior L leg     Wounds History Of Present Absent Venous Arterial Pressure Sheer     x        Signs of Infection Redness Warmth Erythema Acute Swelling Drainage Borders                    Sensation Light Touch Deep pressure Hypersensitivity   In tact Impaired In tact Impaired Absent Impaired   x   x  x     Nails WNL   Fungus nail dystrophy        Hair Growth Symmetrical Asymmetrical   x    Skin Creases Base of toes  Ankles   Base of Fingers knees       Abdominal pannus Thigh Lobules  Face/neck   x x x                                                                                                                              TREATMENT:  Anatomical measurements for BLE adjustable compression stocking alternatives -CircAid Juxtafit essentials PT AND FAMILY EDU   PATIENT EDUCATION:  Discussed differential diagnoses for various swelling disorders. Provided basic level education regarding lymphatic structure and function, etiology, onset patterns, stages of progression, and prevention to limit infection risk, worsening condition and further functional decline. Pt edu for aught interaction between blood circulatory system and lymphatic circulation.Discussed  impact of gravity and co-morbidities on lymphatic  function. Outlined Complete Decongestive Therapy (CDT)  as standard of care and provided in depth information regarding 4 primary components of Intensive and Self Management Phases, including Manual Lymph Drainage (MLD), compression wrapping and garments, skin care, and therapeutic exercise. Dempsey discussion with re need for frequent attendance and high burden of care when caregiver is needed, impact of co morbidities. We discussed  the chronic, progressive nature of lymphedema and Importance of daily, ongoing LE self-care essential for limiting progression and infection risk.  Person educated: Patient and spouse Education method: Explanation, Demonstration, and Handouts Education comprehension: verbalized  understanding, returned demonstration, verbal cues required, and needs further education  LYMPHEDEMA SELF-CARE HOME PROGRAM: BLE lymphatic pumping there ex- 1 set of 10 each element, in order. Hold 5. 2 x daily  2. Daily, short stretch, knee length, multilayer compression bandages to one leg at a time during Intensive Phase CDT  3. Fit with adjustable, knee length, Velcro wrap style compression leggings. Determine if Pt fits into ready made or custom size at initial OT Rx session.  CircAid Juxtafit Essentials are gauge-able for compression class, while similar products are not. Accessories should include 4 pairs of black compressive knee high socks as liners (4 per leg for optimal hygiene (due to lymphorrhea), 2 pac bands for each foot for distal compression when not wearing shoes, 1 black cover up per legging (2 total cover ups).   Custom-made gradient compression garments and HOS devices are medically necessary because they are uniquely sized and shaped to fit the exact dimensions of the affected extremities, and to provide appropriate medical grade, graduated compression essential for optimally managing chronic, progressive lymphedema. Multiple custom compression garments are needed to ensure proper  hygiene due to elevated infection risk. Compression garments should be replaced q 3-6 months when worn consistently for optimal lymphedema control over time. HOS devices, medically necessary to limit fibrosis buildup in tissue, should be replaced q 2 years and PRN when worn out.    4. During self-Management Phase fit with appropriate medical grade, gradient compression stockings paired with compression Capri style leggings for necessary compression and containment to control swelling and reduce pain.  5. Daily skin care with low ph lotion matching skin ph to reduce infection risk.  6.  Daily simple self MLD to limit progression.  7. Leg elevation when seated    ASSESSMENT  CLINICAL IMPRESSION:Completed BLE anatomical measurements for knee length, Juxtafit Essential adjustable , wrap-style, compression leggings alternatives.  These garment alternatives are gauge-able for appropriate, adjustable compression that is less likely to injure skin, or cause pain when donning and doffing. Spouse is able to provide Max assist with donning and doffing with minimal physical effort. Paperwork sent to DME vendor for insurance pre authorization.We'll fit these ASAP. Next session we'll consider arm and hand compression alternatives. Cont as per POC.   (05/14/24 INITIAL OT EVAL: Sunny Cairns Jr. Is an 89 yo male presenting with chronic, fluctuating, BLE/BLQ and BUE/BUQ lymphedema 2/2 suspected systemic overload of the lymphatic system by due to impaired kidney function, and suspected impairment of lymphatic function resulting from chemotherapy. Lymphedema limits functional ambulation and mobility, basic and instrumental ADLs performance, leisure pursuits, productive activities , social participation, and psychosocial concerns, including body image, embarrassment, decreased quality of life.  Pt also presents with a large constellation of factors contributing to swelling in all 4 limbs, which is atypical for  lymphedema.  These factors include renal insufficiency, hypertension, and sleep apnea.   Chronic, progressive lymphedema limits functional performance in all occupational domains, including basic and instrumental ADLs, functional ambulation and mobility,  leisure pursuits, productive activities , social participation, and psychosocial concerns, including body image, embarrassment, and quality of life. Pt will benefit from skilled Occupational Therapy for modified Complete Decongestive Therapy (CDT), the gold standard for lymphedema care.  Modified Intensive Phase CDT will include fitting with adjustable. Knee length, BLE compression garment alternatives that are easy to don and doff. It will include manual lymphatic drainage (MLD) to reduce lymph volume and stimulate lymphangiomotoricity. Pt will benefit from skin care during MLD and multiple times during  the day to limit infection risk. Pt will learn lymphatic pumping ther ex. Pt should remain as active as possible, but due to elevated fall risk he should perform lymphatic ther ex when seated or laying down.   Chronic , progressive, BLE/BLQ  and BUE/BUQ lymphedema has a high burden of care. Mr Demars needs caregiver assistance to don and doff adjustable, knee length, Velcro closure style, compression leggings, which are an adjustable alternative to elastic compression stockings. These should be worn full time during waking hours and, at present, removed during HOS. CircAid Juxtafit Essentials, size TBD,  should be worn over soft cotton liners to protect skin. Medical grade, gradient compression should be calibrated at compression class  1 (18-21 mmHg).  Foot pieces can be removed to fit shoes and socks when going out. Each CircAid leggings should be worn with a well fitting cover to limit skin injury and to limit Velcro contact with clothing.  Pt should also be fitted with comfortable, light, but effective compression garments for arms and hands that limit UE  function as little as possible. Optimal garments TBD.  Skilled OT will focus on managing symptoms and progression of chronic lymphedema. The goals of treatment are to decrease swelling and associated pain/ discomfort, reduce infection risk, improve functional performance and mobility, improve skin integrity,  and improving quality of life. Without skilled OT for CDT infection risk will increase, lymphedema will progress, and further functional decline is expected.  OBJECTIVE IMPAIRMENTS: Abnormal gait, cardiopulmonary status limiting activity, decreased activity tolerance, decreased balance, decreased endurance, decreased knowledge of condition, decreased knowledge of use of DME, decreased mobility, difficulty walking, decreased ROM, decreased strength, hypomobility, increased edema, impaired flexibility, postural dysfunction, pain, and chronic , progressive limb swelling.   ACTIVITY LIMITATIONS: Basic and instrumental ADLs (functional mobility and ambulation, dressing, bathing, fitting preferred shoes and lb clothing, grooming nails, skin care and inspection,home management, cooking, meal prep, shopping, health management, driving, riding). Productive activities (work, caring for others). Leisure pursuits ( spending time with family). Community and social participation (attending church).   PERSONAL FACTORS: Age, Fitness, Past/current experiences, and 3+ comorbidities: kidney disease stages III and I$, CLL, DM, HT, Carotid stenosis, abdominal aortic aneurism are also affecting patient's functional outcome.   REHAB POTENTIAL: Fair with consistent, daily caregiver assistance with all home program components, especially wraps/ garments/ devices. compression. Without caregiver assistance prognosis is poor   EVALUATION COMPLEXITY: High   GOALS: Goals reviewed with patient? Yes Target date: DC from OT  SHORT TERM GOALS: Target date: 4th OT Rx visit   Pt and caregiver will demonstrate understanding of  lymphedema precautions and prevention strategies with modified independence using a printed reference to identify at least 5 precautions and discussing how s/he may implement them into daily life to reduce risk of progression with extra time. Baseline:Max A Goal status: INITIAL  2.  Pt will be able to apply multilayer, thigh length, gradient, compression wraps to one leg at a time from toes to groin with max caregiver assist to decrease limb volume, to limit infection risk, and to limit lymphedema progression.  Baseline: Dependent Goal status: INITIAL  LONG TERM GOALS: Target date: 08/11/24  1.Given this patient's Intake score of 29.41% % on the Lymphedema Life Impact Scale (LLIS), patient will experience a reduction of at least 10 points in his perceived level of functional impairment resulting from lymphedema to improve functional performance and quality of life (QOL). Baseline: 29.41 % Goal status: INITIAL  2.  Pt will achieve at  least a 10% volume reduction in B legs to return limbs to typical size and shape, to limit infection risk and LE progression, to decrease pain, to improve function. Baseline: Dependent Goal status: INITIAL  3. Pt will achieve a reduction of lymphedema related pain/ discomfort in arms and legs of 0/10 by DC to improve QOL.  4.  Pt will obtain appropriate compression garments/devices and be able to don and doff them with minimal assistance to optimize limb volume reductions and limit LE progression over time. Baseline: Dependent Goal status: INITIAL  5. During modified Intensive phase CDT Pt will achieve at least 85% compliance with all adapted lymphedema self-care home program components, including daily skin care, compression wraps and /or garments, simple self MLD and lymphatic pumping therex to habituate LE self care protocol  into ADLs for optimal LE self-management over time. Baseline: Dependent Goal status: INITIAL   PLAN:  OT FREQUENCY: 1x/week and PRN  ( to limit burden of care and support QOL goals)  PT DURATION: 6 weeks and PRN. Provide follow along support as needed  PLANNED INTERVENTIONS: 97110-Therapeutic exercises (lymphatic pumping BUE and BLE) , 97530- Therapeutic activity, 97535- Self Care, [id number]- Manual therapy (MLD, fibrosis techniques), skin care to reduce infection risk, limit fibrosis,  Patient/Family education, effective compression that is comfortable and caregiver friendly and easy to don and doff, consider compression bandaging on limited basis, DME instructions, educate Pt and caregiver re all lymphedema self-care components and support coping strategies  Custom-made gradient compression garments and HOS devices may be medically necessary because they are uniquely sized and shaped to fit the exact dimensions of the affected extremities, and to provide appropriate medical grade, graduated compression essential for optimally managing chronic, progressive lymphedema. Multiple custom compression garments are needed to ensure proper hygiene to limit infection risk. Custom compression garments should be replaced q 3-6 months When worn consistently for optimal lipo-lymphedema self-management over time. HOS devices, medically necessary to limit fibrosis buildup in tissue, should be replaced q 2 years and PRN when worn out.     PLAN FOR NEXT SESSION:  complete BLE limb volumetrics; complete measurements for BLE, adjustable, knee length Circ Aids Juxtafit Essentials and accessories Submit to DME vendor for insurance pre authorization Pt and family edu for LE self care   Zebedee Dec, MS, OTR/L, CLT-LANA 05/22/24 9:14 AM

## 2024-05-23 ENCOUNTER — Encounter (INDEPENDENT_AMBULATORY_CARE_PROVIDER_SITE_OTHER): Payer: Self-pay

## 2024-05-24 ENCOUNTER — Other Ambulatory Visit: Payer: Self-pay | Admitting: Oncology

## 2024-05-24 ENCOUNTER — Encounter (HOSPITAL_COMMUNITY): Payer: Self-pay

## 2024-05-24 ENCOUNTER — Other Ambulatory Visit (HOSPITAL_COMMUNITY): Payer: Self-pay

## 2024-05-24 DIAGNOSIS — C9110 Chronic lymphocytic leukemia of B-cell type not having achieved remission: Secondary | ICD-10-CM

## 2024-05-27 ENCOUNTER — Ambulatory Visit: Admitting: Oncology

## 2024-05-27 ENCOUNTER — Other Ambulatory Visit

## 2024-05-27 ENCOUNTER — Ambulatory Visit

## 2024-05-28 ENCOUNTER — Ambulatory Visit: Admitting: Occupational Therapy

## 2024-05-28 ENCOUNTER — Encounter

## 2024-05-28 DIAGNOSIS — I890 Lymphedema, not elsewhere classified: Secondary | ICD-10-CM | POA: Diagnosis not present

## 2024-05-28 NOTE — Therapy (Signed)
 OUTPATIENT OCCUPATIONAL THERAPY TREATMENT NOTE  BILATERAL LOWER EXTREMITY/ LOWER QUADRANT  LYMPHEDEMA  BILATERAL UPPER EXTREMITY/ UPPER QUADRANT  LYMPHEDEMA     Patient Name: Patrick Payne. MRN: [id number] DOB:05-08-1934, 89 y.o., male Today's Date: 05/28/2024  END OF SESSION:   OT End of Session - 05/28/24 1508     Visit Number 3    Number of Visits 6    Date for OT Re-Evaluation 08/12/24    OT Start Time 1105    OT Stop Time 1215    OT Time Calculation (min) 70 min    Activity Tolerance Patient tolerated treatment well;No increased pain;Patient limited by fatigue    Behavior During Therapy [facility] for tasks assessed/performed            Past Medical History:  Diagnosis Date   Anxiety    CKD (chronic kidney disease), stage III (HCC)    CLL (chronic lymphocytic leukemia) (HCC)    Diabetes mellitus without complication (HCC)    Diverticulitis 04/09/2017   History of kidney stones    HLD (hyperlipidemia)    HTN (hypertension)    PKD (polycystic kidney disease)    Sleep apnea    Uncontrolled type 2 diabetes mellitus with hyperglycemia (HCC) 06/28/2018   Past Surgical History:  Procedure Laterality Date   CARDIAC CATHETERIZATION  12/1987   CENTRAL LINE INSERTION N/A 04/11/2017   Procedure: Central Line Insertion;  Surgeon: Jama Cordella MATSU, MD;  Location: [facility];  Service: Cardiovascular;  Laterality: N/A;   CERVICAL FUSION     CHOLECYSTECTOMY     CYSTOSCOPY W/ RETROGRADES Left 05/12/2017   Procedure: CYSTOSCOPY WITH RETROGRADE PYELOGRAM;  Surgeon: Chauncey Redell Agent, MD;  Location: [facility] ORS;  Service: Urology;  Laterality: Left;   CYSTOSCOPY W/ URETERAL STENT PLACEMENT Left 05/12/2017   Procedure: CYSTOSCOPY WITH STENT REMOVAL;  Surgeon: Chauncey Redell Agent, MD;  Location: [facility] OR[facility];  Service: Urology;  Laterality: Left;   CYSTOSCOPY WITH STENT PLACEMENT Left 04/11/2017   Procedure: CYSTOSCOPY WITH STENT PLACEMENT;  Surgeon: Nieves Cough, MD;   Location: [facility] OR[facility];  Service: Urology;  Laterality: Left;   URETEROSCOPY  05/12/2017   Procedure: URETEROSCOPY;  Surgeon: Chauncey Redell Agent, MD;  Location: [facility] OR[facility];  Service: Urology;;   Patient Active Problem List   Diagnosis Date Noted   Edema 05/08/2024   CKD (chronic kidney disease) stage 4, GFR 15-29 ml/min (HCC) 03/12/2024   Arthralgia of right ankle 12/22/2023   Exudative age-related macular degeneration of right eye with active choroidal neovascularization (HCC) 03/21/2023   Preop cardiovascular exam 01/19/2023   Acute renal failure superimposed on stage 3b chronic kidney disease (HCC) 12/14/2022   Community acquired pneumonia of right lower lobe of lung 12/14/2022   Hydroureter on left 12/14/2022   Acute diarrhea 12/14/2022   Thrombocytopenia (HCC) 12/14/2022   Urinary tract disease 09/28/2021   Hypertension 09/28/2021   Chronic kidney disease, stage IV (severe) (HCC) 05/24/2021   Carotid stenosis 10/23/2020   Aortic atherosclerosis (HCC) 11/08/2019   Anemia in chronic kidney disease 11/05/2019   Secondary hyperparathyroidism of renal origin (HCC) 11/05/2019   Benign hypertensive kidney disease with chronic kidney disease 08/05/2019   Nephrolithiasis 08/05/2019   Proteinuria 08/05/2019   GAD (generalized anxiety disorder) 07/03/2018   DM type 2 with diabetic peripheral neuropathy (HCC) 06/28/2018   Polycystic kidney disease - right side 04/10/2017   Flank pain 04/09/2017   Long term current use of insulin  (HCC) 07/16/2014   Insulin  long-term use (HCC) 07/16/2014  Abdominal aortic aneurysm (AAA) (HCC) 04/17/2014   Chronic kidney disease, stage 3b (HCC) - baseline SCr 2.2-2.5 04/17/2014   Chronic lymphocytic leukemia (HCC) 04/17/2014   Type 2 diabetes mellitus with chronic kidney disease, with long-term current use of insulin  (HCC) 04/17/2014   Essential hypertension 04/17/2014   HLD (hyperlipidemia) 04/17/2014   Obstructive apnea 04/17/2014   AAA (abdominal aortic  aneurysm) (HCC) 04/17/2014   CKD (chronic kidney disease), stage III (HCC) 04/17/2014   CLL (chronic lymphocytic leukemia) (HCC) 04/17/2014   Type 2 diabetes mellitus (HCC) 04/17/2014   H/O malignant neoplasm of skin 06/07/2013    PCP: Patrick JONETTA Costa, MD  REFERRING PROVIDER: Tinnie Dawn, Payne  REFERRING DIAG: I89.0  THERAPY DIAG:  Lymphedema, not elsewhere classified  Rationale for Evaluation and Treatment: Rehabilitation  ONSET DATE: ~ 1 year s/p insidious onset  SUBJECTIVE:                                                                                                                                                                                          SUBJECTIVE STATEMENT: Patrick Payne presents to OT for treatment of bilateral upper and lower extremity lymphedema. He is accompanied by his wife, Patrick Payne. He denies LE-related pain in arms and legs. Pt states after thinking about it he does not think he will be able to wear the CircAid leggings. They are too bulky and I wont be able to put them on. Pt also expresses concern that although we discusses 6-10 OT visits only, he is scheduled out by many appointments which he expects will add to his fatigue. Pt states, I've already had more than 70 medical appointments this year!  (05/14/24 INITIAL OT EVALUATION:  Patrick Payne is referred to Occupational Therapy by Patrick Payne, for evaluation and treatment of chronic, multi factorial, bilateral upper and lower extremity swelling. Patrick Payne reports that he first  noticed leg and arm swelling and a red discoloration when he started chemo. Pt reports that swelling fluctuates some, and typically gets better over night. He wears off the shelf  compression socks. He finally found some he can put on and take off without help. Pt recently elbow length Tubigrip sleeves and off the shelf compression gloves for his hands from OT. I think they have helped. Pt reports he had fluid leaking  from the back of his left leg when swelling was at it;['s worst. He tells me he lost 5 pounds after taking his diuretic. Pt's goals for OT are to reduce and control swelling in his arms and legs and limit infection risk. Pt and his spouse express concern about spending so much  time at medical appointments.)  PERTINENT HISTORY:   Edema 05/08/2024   CKD (chronic kidney disease) stage 4, GFR 15-29 ml/min (HCC) 03/12/2024   Arthralgia of right ankle 12/22/2023   Exudative age-related macular degeneration of right eye with active choroidal neovascularization (HCC) 03/21/2023   Acute renal failure superimposed on stage 3 b chronic kidney disease (HCC) 12/14/2022   Community acquired pneumonia of right lower lobe of lung 12/14/2022   Hypertension 09/28/2021   Chronic kidney disease, stage IV (severe) (HCC) 05/24/2021   Carotid stenosis 10/23/2020   Aortic atherosclerosis (HCC) 11/08/2019   Anemia in chronic kidney disease 11/05/2019   Benign hypertensive kidney disease with chronic kidney disease 08/05/2019   Nephrolithiasis 08/05/2019   Proteinuria 08/05/2019   GAD (generalized anxiety disorder) 07/03/2018   DM type 2 with diabetic peripheral neuropathy (HCC) 06/28/2018   Polycystic kidney disease - right side 04/10/2017   Long term current use of insulin  (HCC) 07/16/2014   Abdominal aortic aneurysm (AAA) (HCC) 04/17/2014   Chronic kidney disease, stage 3 b (HCC) - baseline SCr 2.2-2.5 04/17/2014   Chronic lymphocytic leukemia (HCC) 04/17/2014   Type 2 diabetes mellitus with chronic kidney disease, with long-term current use of insulin  (HCC) 04/17/2014   Essential hypertension 04/17/2014   HLD (hyperlipidemia) 04/17/2014   Obstructive apnea 04/17/2014   AAA (abdominal aortic aneurysm) (HCC) 04/17/2014   CKD (chronic kidney disease), stage III (HCC) 04/17/2014   CLL (chronic lymphocytic leukemia) (HCC) 04/17/2014   Type 2 diabetes mellitus (HCC) 04/17/2014   H/O malignant neoplasm of skin  06/07/2013   PAIN:  Are you having pain? Yes: NPRS scale: not rated numerically bilateral legs, bilateral arms Pain location: Bilateral arms and legs Pain description: heaviness, fullness Aggravating factors: standing, walking Relieving factors: elevation, rubbing  PRECAUTIONS: Fall and Other: LYMPHEDEMA PRECAUTIONS: THYROID, KIDNEY, CARDIAC, PULMONARY, DIABETES SKIN; Fall risk, increased cellulitis  risk  RED FLAGS: Abdominal aneurysm: Yes:     WEIGHT BEARING RESTRICTIONS: No  FALLS:  Has patient fallen in last 6 months? No  LIVING ENVIRONMENT: Lives with: lives with their spouse Lives in: House/apartment Stairs: No;  Has following equipment at home: Single point cane, Environmental consultant - 2 wheeled, Wheelchair (manual), Tour manager, Grab bars, and hand held shower  OCCUPATION: retired Comptroller  LEISURE: family time  HAND DOMINANCE: right   PRIOR LEVEL OF FUNCTION:  Modified independence with household mobility with device, modified independence with basic ADLs, mod I with instrumental ADLs  PATIENT GOALS: reduce limb swelling and associated pain and keep it from getting worse. Limit infection risk   OBJECTIVE: Note: Objective measures were completed at Evaluation unless otherwise noted.  COGNITION:  Overall cognitive status: Within functional limits for tasks assessed   OBSERVATIONS / OTHER ASSESSMENTS:   POSTURE: head forward , flexible kyphosis  LE ROM: limited at hips, knees, ankles,  feet, and hands due to girth at joints and skin approximation due to swelling  LYMPHEDEMA ASSESSMENTS:   SURGERY TYPE/DATE: N/A  NUMBER OF LYMPH NODES REMOVED: N/A  CHEMOTHERAPY: Chemo for CLL  RADIATION: N/a  HORMONE TREATMENT: N/A  INFECTIONS: Hx of infection   LYMPHEDEMA LIFE IMPACT SCALE (LLIS): 29.41% (The extent to which lymphedema-related problems affected your life in the past week)  BLE COMPARATIVE LIMB VOLUMETRICS TBA OT Rx 1  LANDMARK RIGHT   R LEG  (A-D) N/A  R THIGH (E-G) ml  R FULL LIMB (A-G) ml  Limb Volume differential (LVD)  %  Volume change since last measured %  Volume  change since commencing CDT V  (Blank rows = not tested)  LANDMARK LEFT    L LEG (A-D) N/A  L THIGH (E-G) ml  L FULL LIMB (A-G) ml  Limb Volume differential (LVD)  %  Volume change since last visit %  Volume change since commencing CDT %  (Blank rows = not tested)       Skin/ Tissue Condition  Skin  Description Hyper-Keratosis Peau d' Orange Shiny Tight Fibrotic/ Indurated Fatty Doughy Cobblestone      x x  x    Skin dry Flaky WNL Macerated   mildly      Color Redness Varicosities Blanching Hemosiderin Stain Mottled   x    Arms and calves x   Odor Malodorous Yeast Fungal infection  WNL      x   Temperature Warm Cool wnl    x     Pitting Edema   1+ 2+ 3+ 4+ Non-pitting      x      Girth Symmetrical Asymmetrical                   Distribution    L>R toes to groin    Stemmer Sign Positive Negative   +    Lymphorrhea History Of:  Present Absent   x Posterior L leg     Wounds History Of Present Absent Venous Arterial Pressure Sheer     x        Signs of Infection Redness Warmth Erythema Acute Swelling Drainage Borders                    Sensation Light Touch Deep pressure Hypersensitivity   In tact Impaired In tact Impaired Absent Impaired   x   x  x     Nails WNL   Fungus nail dystrophy        Hair Growth Symmetrical Asymmetrical   x    Skin Creases Base of toes  Ankles   Base of Fingers knees       Abdominal pannus Thigh Lobules  Face/neck   x x x                                                                                                                              TREATMENT:  Anatomical measurements for BLE adjustable compression stocking alternatives -CircAid Juxtafit essentials PT AND FAMILY EDU   PATIENT EDUCATION:  Discussed differential diagnoses for various swelling disorders. Provided  basic level education regarding lymphatic structure and function, etiology, onset patterns, stages of progression, and prevention to limit infection risk, worsening condition and further functional decline. Pt edu for aught interaction between blood circulatory system and lymphatic circulation.Discussed  impact of gravity and co-morbidities on lymphatic function. Outlined Complete Decongestive Therapy (CDT)  as standard of care and provided in depth information regarding 4 primary components of Intensive and Self Management Phases, including Manual Lymph Drainage (MLD), compression wrapping and  garments, skin care, and therapeutic exercise. Dempsey discussion with re need for frequent attendance and high burden of care when caregiver is needed, impact of co morbidities. We discussed  the chronic, progressive nature of lymphedema and Importance of daily, ongoing LE self-care essential for limiting progression and infection risk.  Person educated: Patient and spouse Education method: Explanation, Demonstration, and Handouts Education comprehension: verbalized understanding, returned demonstration, verbal cues required, and needs further education  LYMPHEDEMA SELF-CARE HOME PROGRAM: BLE lymphatic pumping there ex- 1 set of 10 each element, in order. Hold 5. 2 x daily  2. Daily, short stretch, knee length, multilayer compression bandages to one leg at a time during Intensive Phase CDT  3. Fit with adjustable, knee length, Velcro wrap style compression leggings. Determine if Pt fits into ready made or custom size at initial OT Rx session.  CircAid Juxtafit Essentials are gauge-able for compression class, while similar products are not. Accessories should include 4 pairs of black compressive knee high socks as liners (4 per leg for optimal hygiene (due to lymphorrhea), 2 pac bands for each foot for distal compression when not wearing shoes, 1 black cover up per legging (2 total cover ups).   Custom-made gradient  compression garments and HOS devices are medically necessary because they are uniquely sized and shaped to fit the exact dimensions of the affected extremities, and to provide appropriate medical grade, graduated compression essential for optimally managing chronic, progressive lymphedema. Multiple custom compression garments are needed to ensure proper hygiene due to elevated infection risk. Compression garments should be replaced q 3-6 months when worn consistently for optimal lymphedema control over time. HOS devices, medically necessary to limit fibrosis buildup in tissue, should be replaced q 2 years and PRN when worn out.    4. During self-Management Phase fit with appropriate medical grade, gradient compression stockings paired with compression Capri style leggings for necessary compression and containment to control swelling and reduce pain.  5. Daily skin care with low ph lotion matching skin ph to reduce infection risk.  6.  Daily simple self MLD to limit progression.  7. Leg elevation when seated    ASSESSMENT  CLINICAL IMPRESSION: Pt reassured that we will keep visit number for OT as low as possible. I suspect that, although we specified ~10 visits, our schedulers went ahead and scheduled  2 x weekly for 12 weeks or so, according to the typical Intensive Phase CDT protocol- 2 x weekly. Provided intro level education re simple self MLD to LLE / LLQ in clinic today. Pt tolerated sequence starting at terminus, than short neck and finishing the terminus area at the clavicles. Pt able to perform diaphragmatic breathing after brief demonstration. Utilized functional inguinal LN in L groin, then performed proximal to distal J strokes to thigh, bottleneck area at medial knee, legs ankles and feet. To complete LLLLE sequence performed 3 full sweeps from distal to proximal.  Finally Pt reports that he will not be able to tolerate the adjustable, knee length, CircAid leggings to control swelling  because they are too bulky and cumbersome.   So rather than proceeding in that alternative compression direction OT provided Pt and his spouse education re long vs short stretch compression bandaging technologies.Pt educated on technology, pros and cons, and demonstrated 3 layers total wrap below the knee on the left. Pt instructed on precautions and told to remove wraps if he feels atypically SOB, experiences dizziness,  or experiences acute pain . Pt was able to get his preferred  shoe on over the wrap and transfer back into transport wc without LOB.Next session OT will teach spouse to apply wraps to legs using bilateral compression wraps. Cont as per POC.   (05/14/24 INITIAL OT EVAL: Montrail Spells Jr. Is an 89 yo male presenting with chronic, fluctuating, BLE/BLQ and BUE/BUQ lymphedema 2/2 suspected systemic overload of the lymphatic system by due to impaired kidney function, and suspected impairment of lymphatic function resulting from chemotherapy. Lymphedema limits functional ambulation and mobility, basic and instrumental ADLs performance, leisure pursuits, productive activities , social participation, and psychosocial concerns, including body image, embarrassment, decreased quality of life.  Pt also presents with a large constellation of factors contributing to swelling in all 4 limbs, which is atypical for lymphedema.  These factors include renal insufficiency, hypertension, and sleep apnea.   Chronic, progressive lymphedema limits functional performance in all occupational domains, including basic and instrumental ADLs, functional ambulation and mobility,  leisure pursuits, productive activities , social participation, and psychosocial concerns, including body image, embarrassment, and quality of life. Pt will benefit from skilled Occupational Therapy for modified Complete Decongestive Therapy (CDT), the gold standard for lymphedema care.  Modified Intensive Phase CDT will include fitting with  adjustable. Knee length, BLE compression garment alternatives that are easy to don and doff. It will include manual lymphatic drainage (MLD) to reduce lymph volume and stimulate lymphangiomotoricity. Pt will benefit from skin care during MLD and multiple times during the day to limit infection risk. Pt will learn lymphatic pumping ther ex. Pt should remain as active as possible, but due to elevated fall risk he should perform lymphatic ther ex when seated or laying down.   Chronic , progressive, BLE/BLQ  and BUE/BUQ lymphedema has a high burden of care. Patrick Fakhouri needs caregiver assistance to don and doff adjustable, knee length, Velcro closure style, compression leggings, which are an adjustable alternative to elastic compression stockings. These should be worn full time during waking hours and, at present, removed during HOS. CircAid Juxtafit Essentials, size TBD,  should be worn over soft cotton liners to protect skin. Medical grade, gradient compression should be calibrated at compression class  1 (18-21 mmHg).  Foot pieces can be removed to fit shoes and socks when going out. Each CircAid leggings should be worn with a well fitting cover to limit skin injury and to limit Velcro contact with clothing.  Pt should also be fitted with comfortable, light, but effective compression garments for arms and hands that limit UE function as little as possible. Optimal garments TBD.  Skilled OT will focus on managing symptoms and progression of chronic lymphedema. The goals of treatment are to decrease swelling and associated pain/ discomfort, reduce infection risk, improve functional performance and mobility, improve skin integrity,  and improving quality of life. Without skilled OT for CDT infection risk will increase, lymphedema will progress, and further functional decline is expected.  OBJECTIVE IMPAIRMENTS: Abnormal gait, cardiopulmonary status limiting activity, decreased activity tolerance, decreased  balance, decreased endurance, decreased knowledge of condition, decreased knowledge of use of DME, decreased mobility, difficulty walking, decreased ROM, decreased strength, hypomobility, increased edema, impaired flexibility, postural dysfunction, pain, and chronic , progressive limb swelling.   ACTIVITY LIMITATIONS: Basic and instrumental ADLs (functional mobility and ambulation, dressing, bathing, fitting preferred shoes and lb clothing, grooming nails, skin care and inspection,home management, cooking, meal prep, shopping, health management, driving, riding). Productive activities (work, caring for others). Leisure pursuits ( spending time with family). Community and social participation (attending church).   PERSONAL  FACTORS: Age, Fitness, Past/current experiences, and 3+ comorbidities: kidney disease stages III and I$, CLL, DM, HT, Carotid stenosis, abdominal aortic aneurism are also affecting patient's functional outcome.   REHAB POTENTIAL: Fair with consistent, daily caregiver assistance with all home program components, especially wraps/ garments/ devices. compression. Without caregiver assistance prognosis is poor   EVALUATION COMPLEXITY: High   GOALS: Goals reviewed with patient? Yes Target date: DC from OT  SHORT TERM GOALS: Target date: 4th OT Rx visit   Pt and caregiver will demonstrate understanding of lymphedema precautions and prevention strategies with modified independence using a printed reference to identify at least 5 precautions and discussing how s/he may implement them into daily life to reduce risk of progression with extra time. Baseline:Max A Goal status: INITIAL  2.  Pt will be able to apply multilayer, thigh length, gradient, compression wraps to one leg at a time from toes to groin with max caregiver assist to decrease limb volume, to limit infection risk, and to limit lymphedema progression.  Baseline: Dependent Goal status: INITIAL  LONG TERM GOALS: Target  date: 08/11/24  1.Given this patient's Intake score of 29.41% % on the Lymphedema Life Impact Scale (LLIS), patient will experience a reduction of at least 10 points in his perceived level of functional impairment resulting from lymphedema to improve functional performance and quality of life (QOL). Baseline: 29.41 % Goal status: INITIAL  2.  Pt will achieve at least a 10% volume reduction in B legs to return limbs to typical size and shape, to limit infection risk and LE progression, to decrease pain, to improve function. Baseline: Dependent Goal status: INITIAL  3. Pt will achieve a reduction of lymphedema related pain/ discomfort in arms and legs of 0/10 by DC to improve QOL.  4.  Pt will obtain appropriate compression garments/devices and be able to don and doff them with minimal assistance to optimize limb volume reductions and limit LE progression over time. Baseline: Dependent Goal status: INITIAL  5. During modified Intensive phase CDT Pt will achieve at least 85% compliance with all adapted lymphedema self-care home program components, including daily skin care, compression wraps and /or garments, simple self MLD and lymphatic pumping therex to habituate LE self care protocol  into ADLs for optimal LE self-management over time. Baseline: Dependent Goal status: INITIAL   PLAN:  OT FREQUENCY: 1x/week and PRN ( to limit burden of care and support QOL goals)  PT DURATION: 6 weeks and PRN. Provide follow along support as needed  PLANNED INTERVENTIONS: 97110-Therapeutic exercises (lymphatic pumping BUE and BLE) , 97530- Therapeutic activity, 97535- Self Care, [id number]- Manual therapy (MLD, fibrosis techniques), skin care to reduce infection risk, limit fibrosis,  Patient/Family education, effective compression that is comfortable and caregiver friendly and easy to don and doff, consider compression bandaging on limited basis, DME instructions, educate Pt and caregiver re all lymphedema  self-care components and support coping strategies  Custom-made gradient compression garments and HOS devices may be medically necessary because they are uniquely sized and shaped to fit the exact dimensions of the affected extremities, and to provide appropriate medical grade, graduated compression essential for optimally managing chronic, progressive lymphedema. Multiple custom compression garments are needed to ensure proper hygiene to limit infection risk. Custom compression garments should be replaced q 3-6 months When worn consistently for optimal lipo-lymphedema self-management over time. HOS devices, medically necessary to limit fibrosis buildup in tissue, should be replaced q 2 years and PRN when worn out.     PLAN  FOR NEXT SESSION:  complete BLE limb volumetrics; complete measurements for BLE, adjustable, knee length Circ Aids Juxtafit Essentials and accessories Submit to DME vendor for insurance pre authorization Pt and family edu for LE self care   Zebedee Dec, MS, OTR/L, CLT-LANA 05/28/24 3:11 PM

## 2024-05-30 ENCOUNTER — Other Ambulatory Visit: Payer: Self-pay | Admitting: *Deleted

## 2024-05-30 ENCOUNTER — Inpatient Hospital Stay

## 2024-05-30 ENCOUNTER — Inpatient Hospital Stay (HOSPITAL_BASED_OUTPATIENT_CLINIC_OR_DEPARTMENT_OTHER): Admitting: Nurse Practitioner

## 2024-05-30 ENCOUNTER — Encounter: Payer: Self-pay | Admitting: Nurse Practitioner

## 2024-05-30 VITALS — BP 121/49 | HR 55 | Temp 98.7°F | Resp 20 | Wt 190.1 lb

## 2024-05-30 DIAGNOSIS — D631 Anemia in chronic kidney disease: Secondary | ICD-10-CM

## 2024-05-30 DIAGNOSIS — D649 Anemia, unspecified: Secondary | ICD-10-CM

## 2024-05-30 DIAGNOSIS — C9110 Chronic lymphocytic leukemia of B-cell type not having achieved remission: Secondary | ICD-10-CM

## 2024-05-30 DIAGNOSIS — D696 Thrombocytopenia, unspecified: Secondary | ICD-10-CM

## 2024-05-30 DIAGNOSIS — N185 Chronic kidney disease, stage 5: Secondary | ICD-10-CM | POA: Diagnosis not present

## 2024-05-30 DIAGNOSIS — Z7189 Other specified counseling: Secondary | ICD-10-CM | POA: Diagnosis not present

## 2024-05-30 DIAGNOSIS — I129 Hypertensive chronic kidney disease with stage 1 through stage 4 chronic kidney disease, or unspecified chronic kidney disease: Secondary | ICD-10-CM | POA: Diagnosis not present

## 2024-05-30 LAB — CMP (CANCER CENTER ONLY)
ALT: 14 U/L (ref 0–44)
AST: 14 U/L — ABNORMAL LOW (ref 15–41)
Albumin: 2.9 g/dL — ABNORMAL LOW (ref 3.5–5.0)
Alkaline Phosphatase: 99 U/L (ref 38–126)
Anion gap: 8 (ref 5–15)
BUN: 74 mg/dL — ABNORMAL HIGH (ref 8–23)
CO2: 20 mmol/L — ABNORMAL LOW (ref 22–32)
Calcium: 7.6 mg/dL — ABNORMAL LOW (ref 8.9–10.3)
Chloride: 108 mmol/L (ref 98–111)
Creatinine: 4.69 mg/dL — ABNORMAL HIGH (ref 0.61–1.24)
GFR, Estimated: 11 mL/min — ABNORMAL LOW (ref >60)
Glucose, Bld: 151 mg/dL — ABNORMAL HIGH (ref 70–99)
Potassium: 3.9 mmol/L (ref 3.5–5.1)
Sodium: 136 mmol/L (ref 135–145)
Total Bilirubin: 0.8 mg/dL (ref 0.0–1.2)
Total Protein: 4.7 g/dL — ABNORMAL LOW (ref 6.5–8.1)

## 2024-05-30 LAB — CBC WITH DIFFERENTIAL/PLATELET
Abs Immature Granulocytes: 0.07 K/uL (ref 0.00–0.07)
Basophils Absolute: 0 K/uL (ref 0.0–0.1)
Basophils Relative: 0 %
Eosinophils Absolute: 0.1 K/uL (ref 0.0–0.5)
Eosinophils Relative: 1 %
HCT: 23.1 % — ABNORMAL LOW (ref 39.0–52.0)
Hemoglobin: 7.4 g/dL — ABNORMAL LOW (ref 13.0–17.0)
Immature Granulocytes: 1 %
Lymphocytes Relative: 74 %
Lymphs Abs: 10.1 K/uL — ABNORMAL HIGH (ref 0.7–4.0)
MCH: 33.6 pg (ref 26.0–34.0)
MCHC: 32 g/dL (ref 30.0–36.0)
MCV: 105 fL — ABNORMAL HIGH (ref 80.0–100.0)
Monocytes Absolute: 0.2 K/uL (ref 0.1–1.0)
Monocytes Relative: 2 %
Neutro Abs: 2.9 K/uL (ref 1.7–7.7)
Neutrophils Relative %: 22 %
Platelets: 19 K/uL — ABNORMAL LOW (ref 150–400)
RBC: 2.2 MIL/uL — ABNORMAL LOW (ref 4.22–5.81)
RDW: 20.4 % — ABNORMAL HIGH (ref 11.5–15.5)
Smear Review: NORMAL
WBC: 13.4 K/uL — ABNORMAL HIGH (ref 4.0–10.5)
nRBC: 0 % (ref 0.0–0.2)

## 2024-05-30 LAB — PREPARE RBC (CROSSMATCH)

## 2024-05-30 MED ORDER — EPOETIN ALFA-EPBX 40000 UNIT/ML IJ SOLN
40000.0000 [IU] | Freq: Once | INTRAMUSCULAR | Status: AC
  Administered 2024-05-30: 40000 [IU] via SUBCUTANEOUS
  Filled 2024-05-30: qty 1

## 2024-05-30 NOTE — Progress Notes (Signed)
 [facility]  [phone] [phone]  ID: Patrick PARAS Kunde Jr. OB: 29-Dec-1933  MR#: [id number]  RDW#:[id number]  Patient Care Team: Auston Reyes BIRCH, MD as PCP - General (Internal Medicine) [name], MD as Consulting Physician (Oncology)  CHIEF COMPLAINT: CLL, anemia/thrombocytopenia.  INTERVAL HISTORY: Patient returns to clinic today for repeat laboratory, further evaluation, and consideration of blood transfusion.  Last received transfusion on 05/22/24. He continues to have chronic weakness and fatigue.  Continues to have easy bruising.  Taking imbruvica  as prescribed and denies significant side effects. Appetite is fair but weight is down today. His edema has improved with OT and compression garments. Tolerating imbruvica  without significant side effects.  He denies any recent fevers or illnesses.  He has no neurologic complaints.  He has a fair appetite.  He has multiple skin lesions, biopsy proven SCC, and is followed by dermatology. At times, he has bleeding of the arm lesion that is difficult to stop. He denies any chest pain, shortness of breath, cough, or hemoptysis.  He denies any nausea, vomiting, constipation, or diarrhea. He has no urinary complaints.  Patient offers no further specific complaints today.  REVIEW OF SYSTEMS:   Review of Systems  Constitutional:  Positive for malaise/fatigue. Negative for diaphoresis, fever and weight loss.  Respiratory: Negative.  Negative for cough and shortness of breath.   Cardiovascular: Negative.  Negative for chest pain and leg swelling.  Gastrointestinal: Negative.  Negative for abdominal pain, blood in stool and melena.  Genitourinary: Negative.  Negative for dysuria.  Musculoskeletal: Negative.  Negative for back pain.  Skin: Negative.  Negative for rash.  Neurological:  Positive for weakness. Negative for dizziness, sensory change, focal weakness and headaches.  Endo/Heme/Allergies:   Bruises/bleeds easily.  Psychiatric/Behavioral: Negative.  The patient is not nervous/anxious.   As per HPI. Otherwise, a complete review of systems is negative.  PAST MEDICAL HISTORY: Past Medical History:  Diagnosis Date   Anxiety    CKD (chronic kidney disease), stage III (HCC)    CLL (chronic lymphocytic leukemia) (HCC)    Diabetes mellitus without complication (HCC)    Diverticulitis 04/09/2017   History of kidney stones    HLD (hyperlipidemia)    HTN (hypertension)    PKD (polycystic kidney disease)    Sleep apnea    Uncontrolled type 2 diabetes mellitus with hyperglycemia (HCC) 06/28/2018    PAST SURGICAL HISTORY: Past Surgical History:  Procedure Laterality Date   CARDIAC CATHETERIZATION  12/1987   CENTRAL LINE INSERTION N/A 04/11/2017   Procedure: Central Line Insertion;  Surgeon: Jama Cordella MATSU, MD;  Location: [facility] IN[facility]ASIVE [facility];  Service: Cardiovascular;  Laterality: N/A;   CERVICAL FUSION     CHOLECYSTECTOMY     CYSTOSCOPY W/ RETROGRADES Left 05/12/2017   Procedure: CYSTOSCOPY WITH RETROGRADE PYELOGRAM;  Surgeon: Chauncey Redell Agent, MD;  Location: [facility] ORS;  Service: Urology;  Laterality: Left;   CYSTOSCOPY W/ URETERAL STENT PLACEMENT Left 05/12/2017   Procedure: CYSTOSCOPY WITH STENT REMOVAL;  Surgeon: Chauncey Redell Agent, MD;  Location: [facility] ORS;  Service: Urology;  Laterality: Left;   CYSTOSCOPY WITH STENT PLACEMENT Left 04/11/2017   Procedure: CYSTOSCOPY WITH STENT PLACEMENT;  Surgeon: Nieves Cough, MD;  Location: [facility] ORS;  Service: Urology;  Laterality: Left;   URETEROSCOPY  05/12/2017   Procedure: URETEROSCOPY;  Surgeon: Chauncey Redell Agent, MD;  Location: [facility] ORS;  Service: Urology;;   FAMILY HISTORY: Reported history of ovarian and lung cancer. Diabetes, hypertension.    ADVANCED  DIRECTIVES:   HEALTH MAINTENANCE: Social History   Tobacco Use   Smoking status: Former    Types: Cigarettes    Passive exposure: Past   Smokeless tobacco: Never    Tobacco comments:    quit in Feb of 1997  Vaping Use   Vaping status: Never Used  Substance Use Topics   Alcohol use: No   Drug use: No    Colonoscopy:  PAP:  Bone density:  Lipid panel:  Allergies  Allergen Reactions   Celecoxib Other (See Comments)    Increased Blood Pressure   Chlorpromazine Nausea Only   Iodinated Contrast Media Other (See Comments)    Stage 3 kidney disease, told IV dye would be bad for him   Prednisone Other (See Comments)    Diabetic   Amoxicillin-Pot Clavulanate Rash    Has patient had a PCN reaction causing immediate rash, facial/tongue/throat swelling, SOB or lightheadedness with hypotension: No Has patient had a PCN reaction causing severe rash involving mucus membranes or skin necrosis: No Has patient had a PCN reaction that required hospitalization: No Has patient had a PCN reaction occurring within the last 10 years: Yes If all of the above answers are NO, then may proceed with Cephalosporin use.    Penicillin V Potassium Rash    Current Outpatient Medications  Medication Sig Dispense Refill   Aflibercept 2 MG/0.05ML SOLN Inject 1 Dose into the eye as directed. One injection into left eye every 8-9 weeks     atorvastatin  (LIPITOR) 10 MG tablet Take 10 mg by mouth at bedtime.      BD PEN NEEDLE NANO 2ND GEN 32G X 4 MM MISC USE AS DIRECTED ONCE A DAY     carvedilol  (COREG ) 12.5 MG tablet Take 12.5 mg by mouth 2 (two) times daily with a meal.     Cholecalciferol (VITAMIN D -3) 5000 units TABS Take 5,000 Units by mouth at bedtime.      clonazePAM  (KLONOPIN ) 1 MG tablet Take 1 mg by mouth at bedtime.      cyanocobalamin 500 MCG tablet Take 1,000 mcg by mouth at bedtime.      diphenoxylate -atropine  (LOMOTIL ) 2.5-0.025 MG tablet Take 1 tablet by mouth 4 (four) times daily as needed for diarrhea or loose stools. 60 tablet 1   empagliflozin (JARDIANCE) 10 MG TABS tablet 10 mg daily.     epoetin  alfa-epbx (RETACRIT ) 40000 UNIT/ML injection Inject  40,000 Units into the skin every 6 (six) weeks. Depending on lab results (if needed)     fexofenadine (ALLEGRA) 180 MG tablet Take 180 mg by mouth daily as needed for allergies.     finasteride  (PROSCAR ) 5 MG tablet Take 1 tablet (5 mg total) by mouth daily. 90 tablet 3   folic acid  (FOLVITE ) 400 MCG tablet Take 400 mcg by mouth daily.     ibrutinib  (IMBRUVICA ) 420 MG tablet Take 1 tablet (420 mg total) by mouth daily. Take with a glass of water. 28 tablet 1   insulin  glargine (LANTUS ) 100 UNIT/ML injection Inject 20-30 Units into the skin every morning. Takes Lantus  20 units if CBG less than 120 mg/dl or Lantus  30 units if CBG is over 120 mg/dl     Multiple Vitamins-Minerals (PRESERVISION AREDS 2 PO) Take 1 capsule by mouth 2 (two) times daily.      prednisoLONE acetate (PRED FORTE) 1 % ophthalmic suspension Place 1 drop into the right eye 4 (four) times daily.     sertraline (ZOLOFT) 100 MG tablet Take 100  mg by mouth daily.     tamsulosin  (FLOMAX ) 0.4 MG CAPS capsule TAKE 1 CAPSULE(0.4 MG) BY MOUTH DAILY AFTER SUPPER 90 capsule 3   telmisartan (MICARDIS) 40 MG tablet Take 40 mg by mouth daily.     torsemide (DEMADEX) 10 MG tablet Take by mouth.     triamcinolone (NASACORT) 55 MCG/ACT AERO nasal inhaler Place 2 sprays into the nose 2 (two) times daily as needed (allergies).      No current facility-administered medications for this visit.   Facility-Administered Medications Ordered in Other Visits  Medication Dose Route Frequency Provider Last Rate Last Admin   epoetin  alfa-epbx (RETACRIT ) injection 40,000 Units  40,000 Units Subcutaneous Once [name], MD        OBJECTIVE: Vitals:   05/30/24 1101  BP: (!) 121/49  Pulse: (!) 55  Resp: 20  Temp: 98.7 F (37.1 C)  SpO2: 100%  Body mass index is 30.68 kg/m.    ECOG FS:2 - Symptomatic, <50% confined to bed  General: Well-developed, well-nourished, no acute distress. Accompanied by wife.  Eyes: Pink conjunctiva, anicteric  sclera. HEENT: Normocephalic, moist mucous membranes. Lungs: No audible wheezing or coughing. Heart: Regular rate and rhythm. Abdomen: Soft, nontender, no obvious distention. Musculoskeletal: No edema, cyanosis, or clubbing. Neuro: Alert, answering all questions appropriately. Cranial nerves grossly intact. Skin: multiple biopsy proven SCC of scalp, arm, legs.  Psych: Normal affect.  LAB RESULTS: Lab Results  Component Value Date   NA 136 05/30/2024   K 3.9 05/30/2024   CL 108 05/30/2024   CO2 20 (L) 05/30/2024   GLUCOSE 151 (H) 05/30/2024   BUN 74 (H) 05/30/2024   CREATININE 4.69 (H) 05/30/2024   CALCIUM  7.6 (L) 05/30/2024   PROT 4.7 (L) 05/30/2024   ALBUMIN 2.9 (L) 05/30/2024   AST 14 (L) 05/30/2024   ALT 14 05/30/2024   ALKPHOS 99 05/30/2024   BILITOT 0.8 05/30/2024   GFRNONAA 11 (L) 05/30/2024   GFRAA 37 (L) 11/19/2019   Lab Results  Component Value Date   WBC 13.4 (H) 05/30/2024   NEUTROABS 2.9 05/30/2024   HGB 7.4 (L) 05/30/2024   HCT 23.1 (L) 05/30/2024   MCV 105.0 (H) 05/30/2024   PLT 19 (L) 05/30/2024   Lab Results  Component Value Date   IRON 79 01/11/2024   TIBC 304 01/11/2024   IRONPCTSAT 26 01/11/2024   Lab Results  Component Value Date   FERRITIN 142 01/11/2024    STUDIES: No results found.  ASSESSMENT: CLL, anemia/thrombocytopenia.  PLAN:    CLL: Bone marrow biopsy on January 08, 2024 reported 69% clonal B-cell lymphocytes consistent with a diagnosis of CLL.  Patient does not have any peripheral lymphocytosis.  No other pathology was reported.  It is possible patient's chronic thrombocytopenia, worsening anemia, night sweats are related to his CLL, therefore patient was initiated on dose reduced Imbruvica  280 mg daily in early April 2025.  He increased to 420 mg at the end of May 2025 and is tolerating treatment well. WBC 13.4. Hmg 7.4. Platelets 19. Return to clinic tomorrow for 1 unit packed red blood cells.  Patient would like to continue  evaluation and treatment every 2 weeks.   Anemia, unspecified: Possibly related to progression of CLL, but we do not have a previous bone marrow biopsy for comparison. He received retacrit  40,000 units on 05/17/24. Hemoglobin today is 7.4. Proceed with 1 unit pRBCs for tomorrow. Proceed with retacrit  40,000 units today then every 2 weeks. All blood products should  be irradiated. Plan to recheck iron studies at next lab draw to ensure optimization. Previously April 2025- ferritin 142, iron sat 26% therefore no role for IV iron. If Hb does not increase by >1 g/dL after 4 weeks, plan to increase retacrit  by 25%. Would not increase dose more frequently than every 4 weeks. Can hold off on tylenol  and benadryl  as pre-meds given his age and previous tolerance. Hold lasix  but would consider if he receives > 1 unit of pRBCs.  Thrombocytopenia: His count was as high as 95 in June 2016. Chronic. Unchanged to decreased. Bone marrow biopsy as above. Platelet count today is 19. No evidence of bleeding but he will need platelet transfusion.  Lymphadenopathy: Patient's most recent CT scan to evaluate his aortic aneurysm on Mar 05, 2024 did not reveal any obvious lymphadenopathy.   Aortic aneurysm: Appreciate vascular surgery input.  Given patient's poor kidney function, surgical repair is likely not possible.  Patient reports vascular surgery will repeat CT scan in 6 months.   Dental surgery: Successful and resolved. Previously, patient required multiple transfusions of platelets prior to his oral surgery.  He did not have any excessive bleeding during his surgery.     CKD : Chronic. Worse. GFR 11. Down from 91. Nephrology has discussed the possibility of dialysis.  Imbruvica  does not need to be dose reduced in the setting of renal dysfunction. He says that he would not likely take dialysis if offered but uncertain.  Edema: Improved with interventions from nephrology. SCC of skin- multiple lesions. Biopsy proven. Followed by  dermatology.  Goals of care- tx given with palliative intent. Patient says that he would not want CPR or heroic measures and prefers a peaceful passing. He has paperwork at home to reflect this and has discussed with his wife who is involved in discussion today.   Disposition:  Retacrit  today 1 unit prbcs and 1 unit platelets tomorrow 2 weeks- lab (cbc, cmp, hold tube, ferritin, iron studies), Dr Jacobo, +/- retacrit  D2- possible 1-2 units blood +/- 1 unit platelets- la  Patient expressed understanding and was in agreement with this plan. He also understands that He can call clinic at any time with any questions, concerns, or complaints.    Cancer Staging  Chronic lymphocytic leukemia (HCC) Staging form: Chronic Lymphocytic Leukemia / Small Lymphocytic Lymphoma, AJCC 8th Edition - Clinical stage from 01/12/2024: Modified Rai Stage IV (Modified Rai risk: High, Lymphocytosis: Absent, Adenopathy: Absent, Organomegaly: Absent, Anemia: Present, Thrombocytopenia: Present) - Signed by [name], MD on 01/13/2024    Tinnie KANDICE Dawn, NP   05/30/2024

## 2024-05-31 ENCOUNTER — Inpatient Hospital Stay

## 2024-05-31 DIAGNOSIS — C9110 Chronic lymphocytic leukemia of B-cell type not having achieved remission: Secondary | ICD-10-CM

## 2024-05-31 DIAGNOSIS — I129 Hypertensive chronic kidney disease with stage 1 through stage 4 chronic kidney disease, or unspecified chronic kidney disease: Secondary | ICD-10-CM | POA: Diagnosis not present

## 2024-05-31 DIAGNOSIS — D631 Anemia in chronic kidney disease: Secondary | ICD-10-CM

## 2024-05-31 MED ORDER — SODIUM CHLORIDE 0.9% IV SOLUTION
250.0000 mL | INTRAVENOUS | Status: DC
  Administered 2024-05-31: 100 mL via INTRAVENOUS
  Filled 2024-05-31: qty 250

## 2024-05-31 MED ORDER — FUROSEMIDE 10 MG/ML IJ SOLN
20.0000 mg | Freq: Once | INTRAMUSCULAR | Status: AC
  Administered 2024-05-31: 20 mg via INTRAVENOUS
  Filled 2024-05-31: qty 2

## 2024-05-31 MED ORDER — ACETAMINOPHEN 325 MG PO TABS
650.0000 mg | ORAL_TABLET | Freq: Once | ORAL | Status: AC
  Administered 2024-05-31: 650 mg via ORAL
  Filled 2024-05-31: qty 2

## 2024-05-31 NOTE — Patient Instructions (Signed)

## 2024-06-01 LAB — TYPE AND SCREEN
ABO/RH(D): A POS
Antibody Screen: NEGATIVE
Unit division: 0

## 2024-06-01 LAB — PREPARE PLATELET PHERESIS: Unit division: 0

## 2024-06-01 LAB — BPAM RBC
Blood Product Expiration Date: 202508312359
ISSUE DATE / TIME: 202508221107
Unit Type and Rh: 600

## 2024-06-01 LAB — BPAM PLATELET PHERESIS
Blood Product Expiration Date: 202508232359
ISSUE DATE / TIME: 202508221259
Unit Type and Rh: 5100

## 2024-06-04 ENCOUNTER — Encounter (INDEPENDENT_AMBULATORY_CARE_PROVIDER_SITE_OTHER): Payer: Medicare Other

## 2024-06-04 ENCOUNTER — Ambulatory Visit (INDEPENDENT_AMBULATORY_CARE_PROVIDER_SITE_OTHER): Payer: Medicare Other | Admitting: Vascular Surgery

## 2024-06-11 ENCOUNTER — Other Ambulatory Visit: Payer: Self-pay

## 2024-06-11 ENCOUNTER — Other Ambulatory Visit: Payer: Self-pay | Admitting: Oncology

## 2024-06-11 ENCOUNTER — Ambulatory Visit: Admitting: Occupational Therapy

## 2024-06-11 DIAGNOSIS — C9110 Chronic lymphocytic leukemia of B-cell type not having achieved remission: Secondary | ICD-10-CM

## 2024-06-11 MED ORDER — IBRUTINIB 420 MG PO TABS
420.0000 mg | ORAL_TABLET | Freq: Every day | ORAL | 1 refills | Status: DC
  Filled 2024-06-11: qty 28, 28d supply, fill #0
  Filled 2024-07-08: qty 28, 28d supply, fill #1

## 2024-06-11 NOTE — Progress Notes (Signed)
 Specialty Pharmacy Refill Coordination Note  Patrick Payne. is a 89 y.o. male contacted today regarding refills of specialty medication(s) Ibrutinib  (IMBRUVICA )   Patient requested Delivery   Delivery date: 06/14/24   Verified address: [address]. [place],  [place] [address]   Medication will be filled on 06/13/24, pending refill approval.

## 2024-06-11 NOTE — Progress Notes (Signed)
 [facility] Follow Up Visit   Patient Name: Patrick Payne, male   Patient DOB: Sep 13, 1934 Date of Service: 06/11/2024  Patient MRN: [id number] Provider Creating Note: Woodward Brought, MD  [phone] Primary Care Physician: Auston Reyes KIDD, MD   [address] [place] [address] Additional Physicians/ Providers:   Impression/Recommendations   Mr. Azan Maneri is an 89 y.o. white male with hypertension, right polycystic kidney disease, hyperlipidemia, diabetes mellitus type II insulin  dependent, gout, BPH, allergic rhinitis, CLL, AAA, obstructive sleep apnea who is following up today for follow up for chronic kidney disease stage V. Creatinine 4.18, GFR of 13.  KFRE: 64.69% in 2 years and 96.12% in 5 years.   1. 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 Right kidney with polycystic kidney disease. Suspect spontaneous mutation. Progression from chronic kidney disease stage V. History of obstructive uropathy with acute renal failure.  -Continue telmisartan -Continue empagliflozin 10mg  daily.  - not currently on an mineralocorticoid receptor antagonist -Discussed dialysis with patient. Patient states he does not want dialysis. He feels that this will not improve his quality of life.   2. Hematuria: Intermittent hematuria. With polycystic kidney. With history of obstruction, cysts and nephrolithiasis.  - Follows with urology, Dr. Francisca.    3. Hypertension: with edema on examination. 106/66 - Current regimen telmisartan, torsemide, tamsulosin , and carvedilol .  - increase torsemide to 40mg  daily - limit fluid intake.  - home blood pressure monitoring.    4. Diabetes mellitus type II with chronic kidney disease: insulin  dependent. Hemoglobin A1c possibly falsely low due to blood transfusions - Continue empagliflozin    5. Gout: no recent acute gout flares.  -  Patient to notify clinic if he has episodes of acute gout.    6. Anemia with chronic kidney disease: with history of CLL. Macrocytic.  With thrombocytopenia.  Recent blood transfusions.  - Followed by hematology, Dr. Jacobo.   7. Secondary Hyperparathyroidism: PTH 97 - Continue cholecalciferol.  - no indication for activated vitamin D .   Patient Active Problem List  Diagnosis  . Hypertensive chronic kidney disease, benign, with chronic kidney disease stage I through stage IV, or unspecified  . Proteinuria  . Type 2 diabetes mellitus with diabetic chronic kidney disease (HCC)  . Hematuria  . Nephrolithiasis  . Acute kidney failure (HCC)  . Anemia in chronic kidney disease  . Secondary hyperparathyroidism of renal origin (HCC)  . Chronic kidney disease, Stage IV (severe) (HCC)  . Thrombocytopenia (HCC)     Orders Placed This Encounter  . torsemide (DEMADEX) 20 MG tablet       Return in about 4 weeks (around 07/09/2024).   Chief Complaint   Chief Complaint  Patient presents with  . Follow-up    History of Present Illness   [name] presents for follow up for chronic kidney disease stage V.  Patient presents with his wife who assists with history taking.   On last visit, patient's torsemide dose was increased to 20 mg daily. He has lost approximately 4 pounds according to patient. His lower extremity swelling has improved. He reports no more weeping.   Patient is having significant peripheral swelling. Patient has reduced is water intake since last visit.   Patient continues to require multiple PRBC and platelet transfusions. He is having significant bleeding episodes.   Patient denies any recent hospitalizations.   Patient went to the beach for a week. He  continues to enjoy eating out with his wife.   Medications   Current Outpatient Medications:  .  torsemide (DEMADEX) 20 MG tablet, Take 2 tablets (40 mg total) by mouth 1 (one) time each day, Disp: 60  tablet, Rfl: 11 .  Aflibercept (Eylea) 2 MG/0.05ML solution, In both eyes, Disp: , Rfl:  .  atorvastatin  (LIPITOR) 10 MG tablet, Take 10 mg by mouth daily, Disp: , Rfl:  .  carvedilol  (COREG ) 12.5 MG tablet, TAKE 1 TABLET(12.5 MG) BY MOUTH IN THE MORNING AND 1 TABLET IN THE EVENING, Disp: 180 tablet, Rfl: 5 .  cholecalciferol (VITAMIN D -3) 1.25 MG (50000 UT) tablet, Take 1 tablet by mouth 1 (one) time each day, Disp: , Rfl:  .  clonazePAM  (KlonoPIN ) 1 MG tablet, 1 (one) time each day, Disp: , Rfl:  .  Cyanocobalamin 1000 MCG capsule, Take 1 tablet by mouth 1 (one) time each day, Disp: , Rfl:  .  Empagliflozin (Jardiance) 10 MG tablet, Take 10 mg by mouth 1 (one) time each day, Disp: 90 tablet, Rfl: 3 .  Epoetin  Alfa-epbx (RETACRIT  IJ), Inject as directed every 30 (thirty) days, Disp: , Rfl:  .  fexofenadine (ALLEGRA) 180 MG tablet, Take 180 mg by mouth if needed, Disp: , Rfl:  .  finasteride  (PROSCAR ) 5 MG tablet, Take 5 mg by mouth daily, Disp: , Rfl:  .  folic acid  (FOLVITE ) 400 MCG tablet, Take 400 mcg by mouth 1 (one) time each day, Disp: , Rfl:  .  Ibrutinib  420 MG tablet, , Disp: , Rfl:  .  insulin  glargine (LANTUS ) 100 UNIT/ML injection, Inject under the skin 1 (one) time each day 10-20 units, Disp: , Rfl:  .  omega-3 acid ethyl esters (LOVAZA) 1 g capsule, Take 2 g by mouth in the morning and 2 g in the evening., Disp: , Rfl:  .  sertraline (ZOLOFT) 50 MG tablet, Take 50 mg by mouth 1 (one) time each day, Disp: , Rfl:  .  tamsulosin  (FLOMAX ) 0.4 MG 24 hr capsule, Take 1 capsule by mouth 1 (one) time each day, Disp: , Rfl:  .  telmisartan (MICARDIS) 40 MG tablet, Take 40 mg by mouth in the morning and 40 mg in the evening., Disp: , Rfl:  .  triamcinolone (NASACORT) 55 MCG/ACT nasal inhaler, 1 spray, Disp: , Rfl:    Allergies Celecoxib, Meloxicam, Prednisone, Amoxicillin-pot clavulanate, Chlorpromazine, and Penicillin v    History Past Medical History:  Diagnosis Date  . Abdominal  aortic aneurysm (HCC)   . Anemia in chronic kidney disease 11/05/2019  . Background diabetic retinopathy (HCC)   . Benign prostatic hyperplasia   . Chronic kidney disease, stage 3b 08/05/2019  . Chronic kidney disease, Stage IV (severe) (HCC) 05/24/2021  . Chronic lymphoid leukemia, disease (HCC)   . Gout   . Hematuria 08/05/2019  . Hypertensive chronic kidney disease, benign, with chronic kidney disease stage I through stage IV, or unspecified 08/05/2019  . Macular degeneration   . Macular degeneration   . Nephrolithiasis 08/05/2019  . Proteinuria 08/05/2019  . Psoriasis   . Seasonal allergic rhinitis   . Secondary hyperparathyroidism of renal origin (HCC) 11/05/2019  . Sleep apnea   . Thrombocytopenia (HCC) 08/02/2023  . Type 2 diabetes mellitus with diabetic chronic kidney disease (HCC) 08/05/2019    No past surgical history on file. Family History  Problem Relation Age of Onset  . Cancer Father        Lung cancer  . Cancer Mother  ovarian cancer  . Diabetes Sibling        sister  . Heart disease Sibling    Social History   Tobacco Use  . Smoking status: Former    Current packs/day: 0.00    Types: Cigarettes    Quit date: 10/10/1994    Years since quitting: 29.6  . Smokeless tobacco: Never  . Tobacco comments:    Smoking History Info:Every day  Substance Use Topics  . Alcohol use: No     Physical Exam  Vitals BP 106/66 (BP Location: Left upper arm, Patient Position: Sitting)   Pulse 56   Temp 97.9 F   Wt 190 lb (86.2 kg)   SpO2 90%   BMI 29.76 kg/m   Vitals reviewed. Constitutional: He is oriented to person, place, and time. He appears well-developed.  HEENT:  Head: Normocephalic and atraumatic. Mouth/Throat: Oropharynx is clear and moist.  Eyes: Pupils are equal, round, and reactive to light.  Neck: Neck supple.  Cardiovascular:  Normal rate and regular rhythm.          He exhibits edema.  Pulmonary/Chest: Effort normal and breath sounds  normal.  Abdominal: Soft.  Neurological: He is alert and oriented to person, place, and time.  Skin: Skin is warm and dry.     Laboratory Studies  Chemistry  Lab Units 05/09/24 1414 11/02/23 1109 07/26/23 1111 07/24/23 1000 04/24/23 1342 03/15/23 1011 01/05/23 1046 12/22/22 1307 12/06/22 1140 12/06/22 1140 10/24/22 0932 09/13/22 0822 06/14/22 1010  SODIUM mmol/L 139 139 142 141  --  139 141 140   < > 136 140  --  140  POTASSIUM mmol/L 5.0 3.9 4.3 3.9  --  4.5 4.1 4.3   < > 4.3 4.5  --  5.0  CHLORIDE mmol/L 110 108 112* 111*  --  109 105 105   < > 106 110  --  111*  CO2 mmol/L 22 23 22  24.7  --  23 30.6 31.1   < > 23.6 24  --  22  ANION GAP   --   --   --   --   --   --  9.5  --   --   --   --   --   --   MAGNESIUM  mg/dL  --   --   --   --   --  2.0  --   --   --   --  1.9  --   --   CALCIUM  mg/dL 7.8* 7.7* 7.7* 7.8*  --  8.0* 7.8* 7.6*   < > 8.2* 8.2*  --  8.0*  PHOSPHORUS mg/dL 6.0* 4.4* 4.2  --   --  3.8  --   --   --   --  4.2  --  4.7*  ALK PHOS U/L  --   --   --  134*  --   --   --  98  --  116*  --   --   --   PTH pg/mL 97* 129* 116*  --   --  82*  --   --   --   --  65  --  56  URIC ACID mg/dL  --   --   --   --   --  5.4  --   --   --   --   --   --   --   GLUCOSE mg/dL 893* 778* 839* 95  --  146* 127*  94   < > 116* 104*  --  108*  ALBUMIN g/dL 3.2* 3.2* 3.4* 3.3*  --  3.4*  --  3.1*   < > 3.5 3.4*  --  3.4*  BUN mg/dL 66* 38* 28* 29*  --  30* 27* 23   < > 49* 33*  --  43*  CREATININE mg/dL 5.81* 7.71* 7.98* 2.0*  --  2.52* 2.4* 2.1*   < > 2.5* 2.16*  --  2.75*  HEMOGLOBIN A1C %  --   --   --  5.3 5.5  --   --   --   --   --   --  6.3*  --    < > = values in this interval not displayed.        No lab exists for component: IRON SATURATION, TRANSSATPER  CBC  Lab Units 05/09/24 1414 11/02/23 1109 07/26/23 1111 03/15/23 1011 10/24/22 0932 06/14/22 1010  WBC AUTO Thousand/uL  --  7.6 11.0* 7.8 14.3* 15.5*  HEMOGLOBIN g/dL  --  8.4* 8.7* 8.6* 9.8* 10.6*   HEMOGLOBIN URINE  3+* 1+* NEGATIVE  --  NEGATIVE  --   HEMATOCRIT %  --  26.9* 28.3* 27.5* 29.9* 33.5*  MCV fL  --  102.7* 102.9* 105.8* 101.0* 103.7*  PLATELETS AUTO Thousand/uL  --  22* 28* 33* 42* 44*    Urine  Lab Units 05/09/24 1414 11/02/23 1109 07/26/23 1111 07/24/23 1000 03/15/23 1011 10/24/22 0932  COLOR U  YELLOW YELLOW YELLOW  --   --  YELLOW  KETONES U MG/DL  NEGATIVE NEGATIVE NEGATIVE  --   --  NEGATIVE  PROT/CREAT RATIO UR mg/g creat 2.327*  2,327* 4.213*  4,213* 4.789*  4,789*  --    < > 1.352*  1,352*  ALB MG/G CREAT UR ug/mg  --   --   --  2,496.6*  --   --    < > = values in this interval not displayed.        No lab exists for component: CYCLOSPORITR     Woodward Brought, MD  [address] [place], [place]

## 2024-06-12 ENCOUNTER — Other Ambulatory Visit: Payer: Self-pay | Admitting: *Deleted

## 2024-06-12 DIAGNOSIS — C9110 Chronic lymphocytic leukemia of B-cell type not having achieved remission: Secondary | ICD-10-CM

## 2024-06-13 ENCOUNTER — Inpatient Hospital Stay

## 2024-06-13 ENCOUNTER — Encounter: Payer: Self-pay | Admitting: Oncology

## 2024-06-13 ENCOUNTER — Inpatient Hospital Stay: Attending: Oncology

## 2024-06-13 ENCOUNTER — Inpatient Hospital Stay (HOSPITAL_BASED_OUTPATIENT_CLINIC_OR_DEPARTMENT_OTHER): Admitting: Oncology

## 2024-06-13 ENCOUNTER — Other Ambulatory Visit: Payer: Self-pay

## 2024-06-13 VITALS — BP 110/59 | HR 80 | Temp 97.6°F | Resp 18 | Ht 66.0 in | Wt 189.0 lb

## 2024-06-13 DIAGNOSIS — E1122 Type 2 diabetes mellitus with diabetic chronic kidney disease: Secondary | ICD-10-CM | POA: Diagnosis not present

## 2024-06-13 DIAGNOSIS — Z794 Long term (current) use of insulin: Secondary | ICD-10-CM | POA: Diagnosis not present

## 2024-06-13 DIAGNOSIS — D631 Anemia in chronic kidney disease: Secondary | ICD-10-CM

## 2024-06-13 DIAGNOSIS — D649 Anemia, unspecified: Secondary | ICD-10-CM

## 2024-06-13 DIAGNOSIS — N1832 Chronic kidney disease, stage 3b: Secondary | ICD-10-CM | POA: Insufficient documentation

## 2024-06-13 DIAGNOSIS — Z79899 Other long term (current) drug therapy: Secondary | ICD-10-CM | POA: Diagnosis not present

## 2024-06-13 DIAGNOSIS — I129 Hypertensive chronic kidney disease with stage 1 through stage 4 chronic kidney disease, or unspecified chronic kidney disease: Secondary | ICD-10-CM | POA: Insufficient documentation

## 2024-06-13 DIAGNOSIS — C9110 Chronic lymphocytic leukemia of B-cell type not having achieved remission: Secondary | ICD-10-CM | POA: Diagnosis not present

## 2024-06-13 DIAGNOSIS — Z87891 Personal history of nicotine dependence: Secondary | ICD-10-CM | POA: Insufficient documentation

## 2024-06-13 DIAGNOSIS — Z7984 Long term (current) use of oral hypoglycemic drugs: Secondary | ICD-10-CM | POA: Diagnosis not present

## 2024-06-13 LAB — CBC WITH DIFFERENTIAL/PLATELET
Abs Immature Granulocytes: 0.1 K/uL — ABNORMAL HIGH (ref 0.00–0.07)
Basophils Absolute: 0 K/uL (ref 0.0–0.1)
Basophils Relative: 0 %
Eosinophils Absolute: 0.1 K/uL (ref 0.0–0.5)
Eosinophils Relative: 1 %
HCT: 24.4 % — ABNORMAL LOW (ref 39.0–52.0)
Hemoglobin: 7.5 g/dL — ABNORMAL LOW (ref 13.0–17.0)
Immature Granulocytes: 1 %
Lymphocytes Relative: 73 %
Lymphs Abs: 6.8 K/uL — ABNORMAL HIGH (ref 0.7–4.0)
MCH: 33 pg (ref 26.0–34.0)
MCHC: 30.7 g/dL (ref 30.0–36.0)
MCV: 107.5 fL — ABNORMAL HIGH (ref 80.0–100.0)
Monocytes Absolute: 0.2 K/uL (ref 0.1–1.0)
Monocytes Relative: 2 %
Neutro Abs: 2.1 K/uL (ref 1.7–7.7)
Neutrophils Relative %: 23 %
Platelets: 18 K/uL — ABNORMAL LOW (ref 150–400)
RBC: 2.27 MIL/uL — ABNORMAL LOW (ref 4.22–5.81)
RDW: 18.8 % — ABNORMAL HIGH (ref 11.5–15.5)
WBC: 9.3 K/uL (ref 4.0–10.5)
nRBC: 0 % (ref 0.0–0.2)

## 2024-06-13 LAB — CMP (CANCER CENTER ONLY)
ALT: 16 U/L (ref 0–44)
AST: 17 U/L (ref 15–41)
Albumin: 3 g/dL — ABNORMAL LOW (ref 3.5–5.0)
Alkaline Phosphatase: 106 U/L (ref 38–126)
Anion gap: 11 (ref 5–15)
BUN: 78 mg/dL — ABNORMAL HIGH (ref 8–23)
CO2: 18 mmol/L — ABNORMAL LOW (ref 22–32)
Calcium: 7.4 mg/dL — ABNORMAL LOW (ref 8.9–10.3)
Chloride: 105 mmol/L (ref 98–111)
Creatinine: 4.75 mg/dL — ABNORMAL HIGH (ref 0.61–1.24)
GFR, Estimated: 11 mL/min — ABNORMAL LOW (ref >60)
Glucose, Bld: 143 mg/dL — ABNORMAL HIGH (ref 70–99)
Potassium: 4.4 mmol/L (ref 3.5–5.1)
Sodium: 134 mmol/L — ABNORMAL LOW (ref 135–145)
Total Bilirubin: 0.8 mg/dL (ref 0.0–1.2)
Total Protein: 4.7 g/dL — ABNORMAL LOW (ref 6.5–8.1)

## 2024-06-13 LAB — IRON AND TIBC
Iron: 80 ug/dL (ref 45–182)
Saturation Ratios: 32 % (ref 17.9–39.5)
TIBC: 249 ug/dL — ABNORMAL LOW (ref 250–450)
UIBC: 169 ug/dL

## 2024-06-13 LAB — FERRITIN: Ferritin: 445 ng/mL — ABNORMAL HIGH (ref 24–336)

## 2024-06-13 LAB — PREPARE RBC (CROSSMATCH)

## 2024-06-13 MED ORDER — EPOETIN ALFA-EPBX 40000 UNIT/ML IJ SOLN
40000.0000 [IU] | Freq: Once | INTRAMUSCULAR | Status: AC
  Administered 2024-06-13: 40000 [IU] via SUBCUTANEOUS
  Filled 2024-06-13: qty 1

## 2024-06-13 NOTE — Progress Notes (Unsigned)
 [facility]  Telephone:([phone] [phone]  ID: Patrick PARAS Grater Jr. OB: 06-08-1934  MR#: [id number]  RDW#:[id number]  Patient Care Team: Auston Reyes BIRCH, MD as PCP - General (Internal Medicine) [name], MD as Consulting Physician (Oncology)  CHIEF COMPLAINT: CLL, anemia/thrombocytopenia.  INTERVAL HISTORY: Patient returns to clinic today for repeat laboratory, further evaluation, and consideration of blood transfusion.  He continues to have chronic weakness and fatigue.  Continues to have easy bruising.  His edema has improved after nephrology adjusted medications and he has added compression stockings.  He is tolerating Imbruvica  without significant side effects.  He denies any recent fevers or illnesses.  He has no neurologic complaints.  He has a fair appetite.  He denies any chest pain, shortness of breath, cough, or hemoptysis.  He denies any nausea, vomiting, constipation, or diarrhea. He has no urinary complaints.  Patient offers no further specific complaints today.  REVIEW OF SYSTEMS:   Review of Systems  Constitutional:  Positive for malaise/fatigue. Negative for diaphoresis, fever and weight loss.  Respiratory: Negative.  Negative for cough and shortness of breath.   Cardiovascular: Negative.  Negative for chest pain and leg swelling.  Gastrointestinal: Negative.  Negative for abdominal pain, blood in stool and melena.  Genitourinary: Negative.  Negative for dysuria.  Musculoskeletal: Negative.  Negative for back pain.  Skin: Negative.  Negative for rash.  Neurological:  Positive for weakness. Negative for dizziness, sensory change, focal weakness and headaches.  Endo/Heme/Allergies:  Bruises/bleeds easily.  Psychiatric/Behavioral: Negative.  The patient is not nervous/anxious.     As per HPI. Otherwise, a complete review of systems is negative.  PAST MEDICAL HISTORY: Past Medical History:  Diagnosis Date   Anxiety    CKD  (chronic kidney disease), stage III (HCC)    CLL (chronic lymphocytic leukemia) (HCC)    Diabetes mellitus without complication (HCC)    Diverticulitis 04/09/2017   History of kidney stones    HLD (hyperlipidemia)    HTN (hypertension)    PKD (polycystic kidney disease)    Sleep apnea    Uncontrolled type 2 diabetes mellitus with hyperglycemia (HCC) 06/28/2018    PAST SURGICAL HISTORY: Past Surgical History:  Procedure Laterality Date   CARDIAC CATHETERIZATION  12/1987   CENTRAL LINE INSERTION N/A 04/11/2017   Procedure: Central Line Insertion;  Surgeon: Jama Cordella MATSU, MD;  Location: [facility];  Service: Cardiovascular;  Laterality: N/A;   CERVICAL FUSION     CHOLECYSTECTOMY     CYSTOSCOPY W/ RETROGRADES Left 05/12/2017   Procedure: CYSTOSCOPY WITH RETROGRADE PYELOGRAM;  Surgeon: Chauncey Redell Agent, MD;  Location: [facility] OR[facility];  Service: Urology;  Laterality: Left;   CYSTOSCOPY W/ URETERAL STENT PLACEMENT Left 05/12/2017   Procedure: CYSTOSCOPY WITH STENT REMOVAL;  Surgeon: Chauncey Redell Agent, MD;  Location: [facility] OR[facility];  Service: Urology;  Laterality: Left;   CYSTOSCOPY WITH STENT PLACEMENT Left 04/11/2017   Procedure: CYSTOSCOPY WITH STENT PLACEMENT;  Surgeon: Nieves Cough, MD;  Location: [facility] OR[facility];  Service: Urology;  Laterality: Left;   URETEROSCOPY  05/12/2017   Procedure: URETEROSCOPY;  Surgeon: Chauncey Redell Agent, MD;  Location: [facility] OR[facility];  Service: Urology;;    FAMILY HISTORY: Reported history of ovarian and lung cancer. Diabetes, hypertension.     ADVANCED DIRECTIVES:    HEALTH MAINTENANCE: Social History   Tobacco Use   Smoking status: Former    Types: Cigarettes    Passive exposure: Past   Smokeless tobacco: Never   Tobacco comments:  quit in Feb of 1997  Vaping Use   Vaping status: Never Used  Substance Use Topics   Alcohol use: No   Drug use: No     Colonoscopy:  PAP:  Bone density:  Lipid panel:  Allergies  Allergen Reactions    Celecoxib Other (See Comments)    Increased Blood Pressure   Chlorpromazine Nausea Only   Iodinated Contrast Media Other (See Comments)    Stage 3 kidney disease, told IV dye would be bad for him   Prednisone Other (See Comments)    Diabetic   Amoxicillin-Pot Clavulanate Rash    Has patient had a PCN reaction causing immediate rash, facial/tongue/throat swelling, SOB or lightheadedness with hypotension: No Has patient had a PCN reaction causing severe rash involving mucus membranes or skin necrosis: No Has patient had a PCN reaction that required hospitalization: No Has patient had a PCN reaction occurring within the last 10 years: Yes If all of the above answers are NO, then may proceed with Cephalosporin use.    Penicillin V Potassium Rash    Current Outpatient Medications  Medication Sig Dispense Refill   Aflibercept 2 MG/0.05ML SOLN Inject 1 Dose into the eye as directed. One injection into left eye every 8-9 weeks     atorvastatin  (LIPITOR) 10 MG tablet Take 10 mg by mouth at bedtime.      BD PEN NEEDLE NANO 2ND GEN 32G X 4 MM MISC USE AS DIRECTED ONCE A DAY     carvedilol  (COREG ) 12.5 MG tablet Take 12.5 mg by mouth 2 (two) times daily with a meal.     Cholecalciferol (VITAMIN D -3) 5000 units TABS Take 5,000 Units by mouth at bedtime.      clonazePAM  (KLONOPIN ) 1 MG tablet Take 1 mg by mouth at bedtime.      cyanocobalamin 500 MCG tablet Take 1,000 mcg by mouth at bedtime.      diphenoxylate -atropine  (LOMOTIL ) 2.5-0.025 MG tablet Take 1 tablet by mouth 4 (four) times daily as needed for diarrhea or loose stools. 60 tablet 1   empagliflozin (JARDIANCE) 10 MG TABS tablet 10 mg daily.     epoetin  alfa-epbx (RETACRIT ) 40000 UNIT/ML injection Inject 40,000 Units into the skin every 6 (six) weeks. Depending on lab results (if needed)     fexofenadine (ALLEGRA) 180 MG tablet Take 180 mg by mouth daily as needed for allergies.     finasteride  (PROSCAR ) 5 MG tablet Take 1 tablet (5 mg  total) by mouth daily. 90 tablet 3   folic acid  (FOLVITE ) 400 MCG tablet Take 400 mcg by mouth daily.     ibrutinib  (IMBRUVICA ) 420 MG tablet Take 1 tablet (420 mg total) by mouth daily. Take with a glass of water. 28 tablet 1   insulin  glargine (LANTUS ) 100 UNIT/ML injection Inject 20-30 Units into the skin every morning. Takes Lantus  20 units if CBG less than 120 mg/dl or Lantus  30 units if CBG is over 120 mg/dl     Multiple Vitamins-Minerals (PRESERVISION AREDS 2 PO) Take 1 capsule by mouth 2 (two) times daily.      prednisoLONE acetate (PRED FORTE) 1 % ophthalmic suspension Place 1 drop into the right eye 4 (four) times daily.     sertraline (ZOLOFT) 100 MG tablet Take 100 mg by mouth daily.     tamsulosin  (FLOMAX ) 0.4 MG CAPS capsule TAKE 1 CAPSULE(0.4 MG) BY MOUTH DAILY AFTER SUPPER 90 capsule 3   telmisartan (MICARDIS) 40 MG tablet Take 40 mg by mouth  daily.     torsemide (DEMADEX) 10 MG tablet Take by mouth.     torsemide (DEMADEX) 20 MG tablet 40 mg.     triamcinolone (NASACORT) 55 MCG/ACT AERO nasal inhaler Place 2 sprays into the nose 2 (two) times daily as needed (allergies).      No current facility-administered medications for this visit.   Facility-Administered Medications Ordered in Other Visits  Medication Dose Route Frequency Provider Last Rate Last Admin   epoetin  alfa-epbx (RETACRIT ) injection 40,000 Units  40,000 Units Subcutaneous Once Darilyn Storbeck J, MD       epoetin  alfa-epbx (RETACRIT ) injection 40,000 Units  40,000 Units Subcutaneous Once [name], MD        OBJECTIVE: Vitals:   06/13/24 1131  BP: (!) 110/59  Pulse: 80  Resp: 18  Temp: 97.6 F (36.4 C)  SpO2: 100%       Body mass index is 30.51 kg/m.    ECOG FS:1 - Symptomatic but completely ambulatory  General: Well-developed, well-nourished, no acute distress. Eyes: Pink conjunctiva, anicteric sclera. HEENT: Normocephalic, moist mucous membranes. Lungs: No audible wheezing or  coughing. Heart: Regular rate and rhythm. Abdomen: Soft, nontender, no obvious distention. Musculoskeletal: No edema, cyanosis, or clubbing. Neuro: Alert, answering all questions appropriately. Cranial nerves grossly intact. Skin: No rashes or petechiae noted. Psych: Normal affect.  LAB RESULTS:  Lab Results  Component Value Date   NA 134 (L) 06/13/2024   K 4.4 06/13/2024   CL 105 06/13/2024   CO2 18 (L) 06/13/2024   GLUCOSE 143 (H) 06/13/2024   BUN 78 (H) 06/13/2024   CREATININE 4.75 (H) 06/13/2024   CALCIUM  7.4 (L) 06/13/2024   PROT 4.7 (L) 06/13/2024   ALBUMIN 3.0 (L) 06/13/2024   AST 17 06/13/2024   ALT 16 06/13/2024   ALKPHOS 106 06/13/2024   BILITOT 0.8 06/13/2024   GFRNONAA 11 (L) 06/13/2024   GFRAA 37 (L) 11/19/2019    Lab Results  Component Value Date   WBC 9.3 06/13/2024   NEUTROABS 2.1 06/13/2024   HGB 7.5 (L) 06/13/2024   HCT 24.4 (L) 06/13/2024   MCV 107.5 (H) 06/13/2024   PLT 18 (L) 06/13/2024   Lab Results  Component Value Date   IRON 79 01/11/2024   TIBC 304 01/11/2024   IRONPCTSAT 26 01/11/2024   Lab Results  Component Value Date   FERRITIN 142 01/11/2024     STUDIES: No results found.  ASSESSMENT: CLL, anemia/thrombocytopenia.  PLAN:    CLL: Bone marrow biopsy on January 08, 2024 reported 69% clonal B-cell lymphocytes consistent with a diagnosis of CLL.  Patient does not have any peripheral lymphocytosis.  No other pathology was reported.  It is possible patient's chronic thrombocytopenia, worsening anemia, night sweats are related to his CLL, therefore patient was initiated on dose reduced Imbruvica  280 mg daily in early April 2025.  He increased to 420 mg at the end of May 2025 and is tolerating treatment well.  Patient's total white blood cell count is now essentially within normal limits at 1.4.  Hemoglobin and platelets remain persistently low.  Return to clinic tomorrow for 2 units packed red blood cells.  Patient would like to continue  evaluation and treatment every 2 weeks.   Anemia, unspecified: Possibly related to progression of CLL, but we do not have a previous bone marrow biopsy for comparison.  Hemoglobin is 6.2 today.  Return to clinic tomorrow for transfusion.  Will also reinitiate 40,000 units Retacrit  every 2 weeks. All blood  products should be irradiated. Thrombocytopenia: Chronic and unchanged.  Patient platelet count is 30 today.  Monitor.  Bone marrow biopsy as above.  Previously, patient required multiple transfusions of platelets prior to his oral surgery.  He did not have any excessive bleeding during his surgery.  His count was as high as 95 in June 2016.  He does not require transfusion today.   Lymphadenopathy: Patient's most recent CT scan to evaluate his aortic aneurysm on Mar 05, 2024 did not reveal any obvious lymphadenopathy.   Aortic aneurysm: Appreciate vascular surgery input.  Given patient's poor kidney function, surgical repair is likely not possible.  Patient reports vascular surgery will repeat CT scan in 6 months.   Dental surgery: Successful and resolved. Renal insufficiency: Chronic and unchanged.  Patient's GFR stable at 13.  Nephrology has discussed the possibility of dialysis.  Imbruvica  does not need to be dose reduced in the setting of renal dysfunction. Edema: Improved with interventions from nephrology.  Patient expressed understanding and was in agreement with this plan. He also understands that He can call clinic at any time with any questions, concerns, or complaints.    Cancer Staging  Chronic lymphocytic leukemia (HCC) Staging form: Chronic Lymphocytic Leukemia / Small Lymphocytic Lymphoma, AJCC 8th Edition - Clinical stage from 01/12/2024: Modified Rai Stage IV (Modified Rai risk: High, Lymphocytosis: Absent, Adenopathy: Absent, Organomegaly: Absent, Anemia: Present, Thrombocytopenia: Present) - Signed by [name], MD on 01/13/2024    Evalene PARAS Jacobo, MD   06/13/2024 11:47  AM

## 2024-06-13 NOTE — Progress Notes (Unsigned)
 Patient is doing ok, no new questions for the doctor today.

## 2024-06-14 ENCOUNTER — Encounter: Payer: Self-pay | Admitting: Oncology

## 2024-06-14 ENCOUNTER — Inpatient Hospital Stay

## 2024-06-14 DIAGNOSIS — D649 Anemia, unspecified: Secondary | ICD-10-CM

## 2024-06-14 DIAGNOSIS — I129 Hypertensive chronic kidney disease with stage 1 through stage 4 chronic kidney disease, or unspecified chronic kidney disease: Secondary | ICD-10-CM | POA: Diagnosis not present

## 2024-06-14 MED ORDER — SODIUM CHLORIDE 0.9% IV SOLUTION
250.0000 mL | INTRAVENOUS | Status: DC
  Filled 2024-06-14: qty 250

## 2024-06-14 MED ORDER — ACETAMINOPHEN 325 MG PO TABS
650.0000 mg | ORAL_TABLET | Freq: Once | ORAL | Status: AC
  Administered 2024-06-14: 650 mg via ORAL
  Filled 2024-06-14: qty 2

## 2024-06-14 MED ORDER — DIPHENHYDRAMINE HCL 50 MG/ML IJ SOLN: 25.0000 mg | Freq: Once | INTRAMUSCULAR | Status: DC

## 2024-06-15 LAB — BPAM RBC
Blood Product Expiration Date: 202509242359
ISSUE DATE / TIME: 202509051032
Unit Type and Rh: 6200

## 2024-06-15 LAB — TYPE AND SCREEN
ABO/RH(D): A POS
Antibody Screen: NEGATIVE
Unit division: 0

## 2024-06-18 ENCOUNTER — Telehealth: Payer: Self-pay | Admitting: *Deleted

## 2024-06-18 ENCOUNTER — Encounter: Payer: Self-pay | Admitting: Oncology

## 2024-06-18 NOTE — Telephone Encounter (Signed)
 Faxed written order for compression wrap and stocking along with office notes to [facility].  .  Fax confirmed.

## 2024-06-19 ENCOUNTER — Ambulatory Visit: Admitting: Occupational Therapy

## 2024-06-26 ENCOUNTER — Ambulatory Visit: Admitting: Occupational Therapy

## 2024-06-27 ENCOUNTER — Inpatient Hospital Stay

## 2024-06-27 ENCOUNTER — Encounter: Payer: Self-pay | Admitting: Nurse Practitioner

## 2024-06-27 ENCOUNTER — Inpatient Hospital Stay (HOSPITAL_BASED_OUTPATIENT_CLINIC_OR_DEPARTMENT_OTHER): Admitting: Nurse Practitioner

## 2024-06-27 ENCOUNTER — Other Ambulatory Visit: Payer: Self-pay | Admitting: *Deleted

## 2024-06-27 VITALS — BP 119/50 | HR 55 | Temp 98.0°F | Resp 18 | Wt 183.0 lb

## 2024-06-27 DIAGNOSIS — I129 Hypertensive chronic kidney disease with stage 1 through stage 4 chronic kidney disease, or unspecified chronic kidney disease: Secondary | ICD-10-CM | POA: Diagnosis not present

## 2024-06-27 DIAGNOSIS — D649 Anemia, unspecified: Secondary | ICD-10-CM | POA: Diagnosis not present

## 2024-06-27 DIAGNOSIS — D696 Thrombocytopenia, unspecified: Secondary | ICD-10-CM

## 2024-06-27 DIAGNOSIS — N189 Chronic kidney disease, unspecified: Secondary | ICD-10-CM

## 2024-06-27 DIAGNOSIS — D631 Anemia in chronic kidney disease: Secondary | ICD-10-CM | POA: Diagnosis not present

## 2024-06-27 DIAGNOSIS — C9110 Chronic lymphocytic leukemia of B-cell type not having achieved remission: Secondary | ICD-10-CM | POA: Diagnosis not present

## 2024-06-27 DIAGNOSIS — N185 Chronic kidney disease, stage 5: Secondary | ICD-10-CM | POA: Diagnosis not present

## 2024-06-27 LAB — CMP (CANCER CENTER ONLY)
ALT: 14 U/L (ref 0–44)
AST: 13 U/L — ABNORMAL LOW (ref 15–41)
Albumin: 2.9 g/dL — ABNORMAL LOW (ref 3.5–5.0)
Alkaline Phosphatase: 99 U/L (ref 38–126)
Anion gap: 10 (ref 5–15)
BUN: 95 mg/dL — ABNORMAL HIGH (ref 8–23)
CO2: 19 mmol/L — ABNORMAL LOW (ref 22–32)
Calcium: 7.4 mg/dL — ABNORMAL LOW (ref 8.9–10.3)
Chloride: 103 mmol/L (ref 98–111)
Creatinine: 6.02 mg/dL — ABNORMAL HIGH (ref 0.61–1.24)
GFR, Estimated: 8 mL/min — ABNORMAL LOW (ref >60)
Glucose, Bld: 171 mg/dL — ABNORMAL HIGH (ref 70–99)
Potassium: 3.7 mmol/L (ref 3.5–5.1)
Sodium: 132 mmol/L — ABNORMAL LOW (ref 135–145)
Total Bilirubin: 0.9 mg/dL (ref 0.0–1.2)
Total Protein: 4.5 g/dL — ABNORMAL LOW (ref 6.5–8.1)

## 2024-06-27 LAB — CBC WITH DIFFERENTIAL (CANCER CENTER ONLY)
Abs Immature Granulocytes: 0.08 K/uL — ABNORMAL HIGH (ref 0.00–0.07)
Basophils Absolute: 0 K/uL (ref 0.0–0.1)
Basophils Relative: 0 %
Eosinophils Absolute: 0 K/uL (ref 0.0–0.5)
Eosinophils Relative: 1 %
HCT: 24.8 % — ABNORMAL LOW (ref 39.0–52.0)
Hemoglobin: 7.8 g/dL — ABNORMAL LOW (ref 13.0–17.0)
Immature Granulocytes: 1 %
Lymphocytes Relative: 79 %
Lymphs Abs: 4.8 K/uL — ABNORMAL HIGH (ref 0.7–4.0)
MCH: 33.8 pg (ref 26.0–34.0)
MCHC: 31.5 g/dL (ref 30.0–36.0)
MCV: 107.4 fL — ABNORMAL HIGH (ref 80.0–100.0)
Monocytes Absolute: 0.2 K/uL (ref 0.1–1.0)
Monocytes Relative: 3 %
Neutro Abs: 1 K/uL — ABNORMAL LOW (ref 1.7–7.7)
Neutrophils Relative %: 16 %
Platelet Count: 18 K/uL — ABNORMAL LOW (ref 150–400)
RBC: 2.31 MIL/uL — ABNORMAL LOW (ref 4.22–5.81)
RDW: 17.4 % — ABNORMAL HIGH (ref 11.5–15.5)
WBC Count: 6.1 K/uL (ref 4.0–10.5)
nRBC: 0 % (ref 0.0–0.2)

## 2024-06-27 MED ORDER — EPOETIN ALFA-EPBX 40000 UNIT/ML IJ SOLN
40000.0000 [IU] | Freq: Once | INTRAMUSCULAR | Status: AC
  Administered 2024-06-27: 40000 [IU] via SUBCUTANEOUS
  Filled 2024-06-27: qty 1

## 2024-06-27 NOTE — Progress Notes (Signed)
 [facility]  [phone] [phone]  ID: Patrick PARAS Flannigan Jr. OB: 04-15-1934  MR#: [id number]  RDW#:[id number]  Patient Care Team: Auston Reyes BIRCH, MD as PCP - General (Internal Medicine) [name], MD as Consulting Physician (Oncology)  CHIEF COMPLAINT: CLL, anemia/thrombocytopenia.  INTERVAL HISTORY: Patient returns to clinic today for repeat laboratory, further evaluation, and consideration of blood transfusion.  He continues to have chronic weakness and fatigue and easy bruising, but otherwise feels well.  His edema has improved after nephrology adjusted medications and he has added compression stockings.  He is tolerating Imbruvica  without significant side effects.  He denies any recent fevers or illnesses.  He has no neurologic complaints.  He has a fair appetite.  He denies any chest pain, shortness of breath, cough, or hemoptysis.  He denies any nausea, vomiting, constipation, or diarrhea. He has no urinary complaints.  Patient offers no further specific complaints today.  REVIEW OF SYSTEMS:   Review of Systems  Constitutional:  Positive for malaise/fatigue. Negative for diaphoresis, fever and weight loss.  Respiratory: Negative.  Negative for cough and shortness of breath.   Cardiovascular: Negative.  Negative for chest pain and leg swelling.  Gastrointestinal: Negative.  Negative for abdominal pain, blood in stool and melena.  Genitourinary: Negative.  Negative for dysuria.  Musculoskeletal: Negative.  Negative for back pain.  Skin: Negative.  Negative for rash.  Neurological:  Positive for weakness. Negative for dizziness, sensory change, focal weakness and headaches.  Endo/Heme/Allergies:  Bruises/bleeds easily.  Psychiatric/Behavioral: Negative.  The patient is not nervous/anxious.   As per HPI. Otherwise, a complete review of systems is negative.  PAST MEDICAL HISTORY: Past Medical History:  Diagnosis Date   Anxiety     CKD (chronic kidney disease), stage III (HCC)    CLL (chronic lymphocytic leukemia) (HCC)    Diabetes mellitus without complication (HCC)    Diverticulitis 04/09/2017   History of kidney stones    HLD (hyperlipidemia)    HTN (hypertension)    PKD (polycystic kidney disease)    Sleep apnea    Uncontrolled type 2 diabetes mellitus with hyperglycemia (HCC) 06/28/2018    PAST SURGICAL HISTORY: Past Surgical History:  Procedure Laterality Date   CARDIAC CATHETERIZATION  12/1987   CENTRAL LINE INSERTION N/A 04/11/2017   Procedure: Central Line Insertion;  Surgeon: Jama Cordella MATSU, MD;  Location: [facility];  Service: Cardiovascular;  Laterality: N/A;   CERVICAL FUSION     CHOLECYSTECTOMY     CYSTOSCOPY W/ RETROGRADES Left 05/12/2017   Procedure: CYSTOSCOPY WITH RETROGRADE PYELOGRAM;  Surgeon: Chauncey Redell Agent, MD;  Location: [facility];  Service: Urology;  Laterality: Left;   CYSTOSCOPY W/ URETERAL STENT PLACEMENT Left 05/12/2017   Procedure: CYSTOSCOPY WITH STENT REMOVAL;  Surgeon: Chauncey Redell Agent, MD;  Location: [facility];  Service: Urology;  Laterality: Left;   CYSTOSCOPY WITH STENT PLACEMENT Left 04/11/2017   Procedure: CYSTOSCOPY WITH STENT PLACEMENT;  Surgeon: Nieves Cough, MD;  Location: [facility];  Service: Urology;  Laterality: Left;   URETEROSCOPY  05/12/2017   Procedure: URETEROSCOPY;  Surgeon: Chauncey Redell Agent, MD;  Location: [facility];  Service: Urology;;    FAMILY HISTORY: Reported history of ovarian and lung cancer. Diabetes, hypertension.     ADVANCED DIRECTIVES:    HEALTH MAINTENANCE: Social History   Tobacco Use   Smoking status: Former    Types: Cigarettes    Passive exposure: Past   Smokeless tobacco: Never   Tobacco comments:  quit in Feb of 1997  Vaping Use   Vaping status: Never Used  Substance Use Topics   Alcohol use: No   Drug use: No    Colonoscopy:  PAP:  Bone density:  Lipid panel:  Allergies  Allergen Reactions    Celecoxib Other (See Comments)    Increased Blood Pressure   Chlorpromazine Nausea Only   Iodinated Contrast Media Other (See Comments)    Stage 3 kidney disease, told IV dye would be bad for him   Prednisone Other (See Comments)    Diabetic   Amoxicillin-Pot Clavulanate Rash    Has patient had a PCN reaction causing immediate rash, facial/tongue/throat swelling, SOB or lightheadedness with hypotension: No Has patient had a PCN reaction causing severe rash involving mucus membranes or skin necrosis: No Has patient had a PCN reaction that required hospitalization: No Has patient had a PCN reaction occurring within the last 10 years: Yes If all of the above answers are NO, then may proceed with Cephalosporin use.    Penicillin V Potassium Rash    Current Outpatient Medications  Medication Sig Dispense Refill   Aflibercept 2 MG/0.05ML SOLN Inject 1 Dose into the eye as directed. One injection into left eye every 8-9 weeks     atorvastatin  (LIPITOR) 10 MG tablet Take 10 mg by mouth at bedtime.      BD PEN NEEDLE NANO 2ND GEN 32G X 4 MM MISC USE AS DIRECTED ONCE A DAY     carvedilol  (COREG ) 12.5 MG tablet Take 12.5 mg by mouth 2 (two) times daily with a meal.     Cholecalciferol (VITAMIN D -3) 5000 units TABS Take 5,000 Units by mouth at bedtime.      clonazePAM  (KLONOPIN ) 1 MG tablet Take 1 mg by mouth at bedtime.      cyanocobalamin 500 MCG tablet Take 1,000 mcg by mouth at bedtime.      diphenoxylate -atropine  (LOMOTIL ) 2.5-0.025 MG tablet Take 1 tablet by mouth 4 (four) times daily as needed for diarrhea or loose stools. 60 tablet 1   empagliflozin (JARDIANCE) 10 MG TABS tablet 10 mg daily.     epoetin  alfa-epbx (RETACRIT ) 40000 UNIT/ML injection Inject 40,000 Units into the skin every 6 (six) weeks. Depending on lab results (if needed)     fexofenadine (ALLEGRA) 180 MG tablet Take 180 mg by mouth daily as needed for allergies.     finasteride  (PROSCAR ) 5 MG tablet Take 1 tablet (5 mg  total) by mouth daily. 90 tablet 3   folic acid  (FOLVITE ) 400 MCG tablet Take 400 mcg by mouth daily.     ibrutinib  (IMBRUVICA ) 420 MG tablet Take 1 tablet (420 mg total) by mouth daily. Take with a glass of water. 28 tablet 1   insulin  glargine (LANTUS ) 100 UNIT/ML injection Inject 20-30 Units into the skin every morning. Takes Lantus  20 units if CBG less than 120 mg/dl or Lantus  30 units if CBG is over 120 mg/dl     Multiple Vitamins-Minerals (PRESERVISION AREDS 2 PO) Take 1 capsule by mouth 2 (two) times daily.      prednisoLONE acetate (PRED FORTE) 1 % ophthalmic suspension Place 1 drop into the right eye 4 (four) times daily.     sertraline (ZOLOFT) 100 MG tablet Take 100 mg by mouth daily.     tamsulosin  (FLOMAX ) 0.4 MG CAPS capsule TAKE 1 CAPSULE(0.4 MG) BY MOUTH DAILY AFTER SUPPER 90 capsule 3   telmisartan (MICARDIS) 40 MG tablet Take 40 mg by mouth daily.  torsemide (DEMADEX) 10 MG tablet Take by mouth.     torsemide (DEMADEX) 20 MG tablet 40 mg.     triamcinolone (NASACORT) 55 MCG/ACT AERO nasal inhaler Place 2 sprays into the nose 2 (two) times daily as needed (allergies).      No current facility-administered medications for this visit.   Facility-Administered Medications Ordered in Other Visits  Medication Dose Route Frequency Provider Last Rate Last Admin   epoetin  alfa-epbx (RETACRIT ) injection 40,000 Units  40,000 Units Subcutaneous Once Finnegan, Timothy J, MD        OBJECTIVE: Vitals:   06/27/24 1118  BP: (!) 119/50  Pulse: (!) 55  Resp: 18  Temp: 98 F (36.7 C)  SpO2: 100%  Body mass index is 29.54 kg/m.    ECOG FS:1 - Symptomatic but completely ambulatory  General: Well-developed, well-nourished, no acute distress. Eyes: Pink conjunctiva, anicteric sclera. HEENT: Normocephalic, moist mucous membranes. Lungs: No audible wheezing or coughing. Heart: Regular rate and rhythm. Abdomen: Soft, nontender, no obvious distention. Musculoskeletal: No edema,  cyanosis, or clubbing. Neuro: Alert, answering all questions appropriately. Cranial nerves grossly intact. Skin: biopsy proven SCC Psych: Normal affect.  LAB RESULTS:  Lab Results  Component Value Date   NA 132 (L) 06/27/2024   K 3.7 06/27/2024   CL 103 06/27/2024   CO2 19 (L) 06/27/2024   GLUCOSE 171 (H) 06/27/2024   BUN 95 (H) 06/27/2024   CREATININE 6.02 (H) 06/27/2024   CALCIUM  7.4 (L) 06/27/2024   PROT 4.5 (L) 06/27/2024   ALBUMIN 2.9 (L) 06/27/2024   AST 13 (L) 06/27/2024   ALT 14 06/27/2024   ALKPHOS 99 06/27/2024   BILITOT 0.9 06/27/2024   GFRNONAA 8 (L) 06/27/2024   GFRAA 37 (L) 11/19/2019   Lab Results  Component Value Date   WBC 6.1 06/27/2024   NEUTROABS 1.0 (L) 06/27/2024   HGB 7.8 (L) 06/27/2024   HCT 24.8 (L) 06/27/2024   MCV 107.4 (H) 06/27/2024   PLT 18 (L) 06/27/2024   Lab Results  Component Value Date   IRON 80 06/13/2024   TIBC 249 (L) 06/13/2024   IRONPCTSAT 32 06/13/2024   Lab Results  Component Value Date   FERRITIN 445 (H) 06/13/2024   STUDIES: No results found.  ASSESSMENT: CLL, anemia/thrombocytopenia  PLAN:  CLL: Bone marrow biopsy on January 08, 2024 reported 69% clonal B-cell lymphocytes consistent with a diagnosis of CLL.  Patient does not have any peripheral lymphocytosis.  No other pathology was reported.  It is possible patient's chronic thrombocytopenia, worsening anemia, night sweats are related to his CLL, therefore patient was initiated on dose reduced Imbruvica  280 mg daily in early April 2025.  He increased to 420 mg at the end of May 2025 and is tolerating treatment well.  Patient's total white blood cell count is now within normal limits at 96.1 but ANC is 1. Return to clinic tomorrow for 1 unit packed red blood cells.  Patient will then return to clinic in 2 weeks for repeat laboratory work, further evaluation, and continuation of treatment.   Anemia, unspecified: Possibly related to progression of CLL, but we do not have a  previous bone marrow biopsy for comparison.  Hemoglobin is decreased, but stable at 7.8.  Proceed with 40,000 units Retacrit  today.  Return to clinic tomorrow for 1 unit of blood and in 2 weeks as above.  All blood products should be irradiated. Thrombocytopenia: Chronic and unchanged.  Patient's platelet count is 18.  Monitor.  Bone marrow biopsy  as above.  Previously, patient required multiple transfusions of platelets prior to his oral surgery.  He did not have any excessive bleeding during his surgery.  His count was as high as 95 in June 2016.  He does not require transfusion today. Plan for transfusion for plt < 10 or < 20 with active bleeding.  Lymphadenopathy: Patient's most recent CT scan to evaluate his aortic aneurysm on Mar 05, 2024 did not reveal any obvious lymphadenopathy.   Aortic aneurysm: Appreciate vascular surgery input.  Given patient's poor kidney function, surgical repair is likely not possible.  Patient reports vascular surgery will repeat CT scan in 6 months.   Dental surgery: Successful and resolved. Renal insufficiency: Patient's GFR is slowly trending down and is now 11.  He discussed possible dialysis with nephrology but ultimately declined.  We briefly discussed hospice and end-of-life care given his worsening renal function, patient acknowledged that this likely will be necessary in the future. Edema: Improved with interventions from nephrology.  Disposition:  Retacrit  today Blood tomorrow 2 weeks- lab, Dr Jacobo, +/- retacrit  D2 +/- 1 unit pRBCs- la  Patient expressed understanding and was in agreement with this plan. He also understands that He can call clinic at any time with any questions, concerns, or complaints.    Cancer Staging  Chronic lymphocytic leukemia (HCC) Staging form: Chronic Lymphocytic Leukemia / Small Lymphocytic Lymphoma, AJCC 8th Edition - Clinical stage from 01/12/2024: Modified Rai Stage IV (Modified Rai risk: High, Lymphocytosis: Absent,  Adenopathy: Absent, Organomegaly: Absent, Anemia: Present, Thrombocytopenia: Present) - Signed by [name], MD on 01/13/2024   Tinnie KANDICE Dawn, NP    06/27/2024

## 2024-06-28 ENCOUNTER — Inpatient Hospital Stay

## 2024-06-28 LAB — TYPE AND SCREEN
ABO/RH(D): A POS
Antibody Screen: NEGATIVE
Unit division: 0

## 2024-06-28 LAB — BPAM RBC
Blood Product Expiration Date: 202510152359
Unit Type and Rh: 6200

## 2024-06-28 LAB — PREPARE RBC (CROSSMATCH)

## 2024-07-03 ENCOUNTER — Encounter: Admitting: Occupational Therapy

## 2024-07-04 ENCOUNTER — Other Ambulatory Visit: Payer: Self-pay | Admitting: *Deleted

## 2024-07-04 ENCOUNTER — Inpatient Hospital Stay

## 2024-07-04 DIAGNOSIS — D631 Anemia in chronic kidney disease: Secondary | ICD-10-CM

## 2024-07-04 DIAGNOSIS — I129 Hypertensive chronic kidney disease with stage 1 through stage 4 chronic kidney disease, or unspecified chronic kidney disease: Secondary | ICD-10-CM | POA: Diagnosis not present

## 2024-07-04 DIAGNOSIS — D649 Anemia, unspecified: Secondary | ICD-10-CM

## 2024-07-04 DIAGNOSIS — D696 Thrombocytopenia, unspecified: Secondary | ICD-10-CM

## 2024-07-04 DIAGNOSIS — C9110 Chronic lymphocytic leukemia of B-cell type not having achieved remission: Secondary | ICD-10-CM

## 2024-07-04 LAB — CBC WITH DIFFERENTIAL (CANCER CENTER ONLY)
Abs Immature Granulocytes: 0.02 K/uL (ref 0.00–0.07)
Basophils Absolute: 0 K/uL (ref 0.0–0.1)
Basophils Relative: 0 %
Eosinophils Absolute: 0.1 K/uL (ref 0.0–0.5)
Eosinophils Relative: 1 %
HCT: 22.9 % — ABNORMAL LOW (ref 39.0–52.0)
Hemoglobin: 7.4 g/dL — ABNORMAL LOW (ref 13.0–17.0)
Immature Granulocytes: 0 %
Lymphocytes Relative: 88 %
Lymphs Abs: 6.2 K/uL — ABNORMAL HIGH (ref 0.7–4.0)
MCH: 34.3 pg — ABNORMAL HIGH (ref 26.0–34.0)
MCHC: 32.3 g/dL (ref 30.0–36.0)
MCV: 106 fL — ABNORMAL HIGH (ref 80.0–100.0)
Monocytes Absolute: 0.2 K/uL (ref 0.1–1.0)
Monocytes Relative: 3 %
Neutro Abs: 0.6 K/uL — ABNORMAL LOW (ref 1.7–7.7)
Neutrophils Relative %: 8 %
Platelet Count: 20 K/uL — ABNORMAL LOW (ref 150–400)
RBC: 2.16 MIL/uL — ABNORMAL LOW (ref 4.22–5.81)
RDW: 17.1 % — ABNORMAL HIGH (ref 11.5–15.5)
WBC Count: 7 K/uL (ref 4.0–10.5)
nRBC: 0 % (ref 0.0–0.2)

## 2024-07-04 LAB — CMP (CANCER CENTER ONLY)
ALT: 13 U/L (ref 0–44)
AST: 14 U/L — ABNORMAL LOW (ref 15–41)
Albumin: 2.8 g/dL — ABNORMAL LOW (ref 3.5–5.0)
Alkaline Phosphatase: 93 U/L (ref 38–126)
Anion gap: 13 (ref 5–15)
BUN: 94 mg/dL — ABNORMAL HIGH (ref 8–23)
CO2: 16 mmol/L — ABNORMAL LOW (ref 22–32)
Calcium: 7.5 mg/dL — ABNORMAL LOW (ref 8.9–10.3)
Chloride: 106 mmol/L (ref 98–111)
Creatinine: 6.56 mg/dL — ABNORMAL HIGH (ref 0.61–1.24)
GFR, Estimated: 8 mL/min — ABNORMAL LOW (ref >60)
Glucose, Bld: 163 mg/dL — ABNORMAL HIGH (ref 70–99)
Potassium: 3.7 mmol/L (ref 3.5–5.1)
Sodium: 135 mmol/L (ref 135–145)
Total Bilirubin: 0.9 mg/dL (ref 0.0–1.2)
Total Protein: 4.5 g/dL — ABNORMAL LOW (ref 6.5–8.1)

## 2024-07-04 LAB — PREPARE RBC (CROSSMATCH)

## 2024-07-05 ENCOUNTER — Inpatient Hospital Stay

## 2024-07-05 DIAGNOSIS — I129 Hypertensive chronic kidney disease with stage 1 through stage 4 chronic kidney disease, or unspecified chronic kidney disease: Secondary | ICD-10-CM | POA: Diagnosis not present

## 2024-07-05 DIAGNOSIS — D649 Anemia, unspecified: Secondary | ICD-10-CM

## 2024-07-05 MED ORDER — SODIUM CHLORIDE 0.9% IV SOLUTION
250.0000 mL | INTRAVENOUS | Status: DC
  Administered 2024-07-05: 250 mL via INTRAVENOUS
  Filled 2024-07-05: qty 250

## 2024-07-05 MED ORDER — ACETAMINOPHEN 325 MG PO TABS
650.0000 mg | ORAL_TABLET | Freq: Once | ORAL | Status: AC
  Administered 2024-07-05: 650 mg via ORAL
  Filled 2024-07-05: qty 2

## 2024-07-06 LAB — TYPE AND SCREEN
ABO/RH(D): A POS
Antibody Screen: NEGATIVE
Unit division: 0

## 2024-07-06 LAB — BPAM RBC
Blood Product Expiration Date: 202510172359
ISSUE DATE / TIME: 202509261015
Unit Type and Rh: 5100

## 2024-07-08 ENCOUNTER — Encounter (INDEPENDENT_AMBULATORY_CARE_PROVIDER_SITE_OTHER): Payer: Self-pay

## 2024-07-08 ENCOUNTER — Other Ambulatory Visit: Payer: Self-pay

## 2024-07-08 ENCOUNTER — Other Ambulatory Visit: Payer: Self-pay | Admitting: Pharmacy Technician

## 2024-07-08 NOTE — Progress Notes (Signed)
 Specialty Pharmacy Refill Coordination Note  Patrick Payne. is a 89 y.o. male contacted today regarding refills of specialty medication(s) Ibrutinib  (IMBRUVICA )   Patient requested (Patient-Rptd) Delivery   Delivery date: 07/12/24 Verified address: (Patient-Rptd) [address] [place], [place] [address]   Medication will be filled on 07/11/24.

## 2024-07-09 ENCOUNTER — Other Ambulatory Visit: Payer: Self-pay

## 2024-07-09 NOTE — Progress Notes (Signed)
 Specialty Pharmacy Ongoing Clinical Assessment Note  Patrick Krotz. is a 89 y.o. male who is being followed by the specialty pharmacy service for RxSp Oncology   Patient's specialty medication(s) reviewed today: Ibrutinib  (IMBRUVICA )   Missed doses in the last 4 weeks: 0   Patient/Caregiver did not have any additional questions or concerns.   Therapeutic benefit summary: Patient is achieving benefit   Adverse events/side effects summary: No adverse events/side effects   Patient's therapy is appropriate to: Continue    Goals Addressed             This Visit's Progress    Achieve or maintain remission   On track    Patient is on track. Patient will maintain adherence.  Dr. Leonardo office visit notes from 06/13/24 note that the patient's WBC count has normalized and patient is tolerating treatment well.         Follow up: 3 months  Patrick Payne Specialty Pharmacist

## 2024-07-10 ENCOUNTER — Encounter: Admitting: Occupational Therapy

## 2024-07-10 ENCOUNTER — Other Ambulatory Visit: Payer: Self-pay

## 2024-07-11 ENCOUNTER — Ambulatory Visit: Admitting: Oncology

## 2024-07-11 ENCOUNTER — Other Ambulatory Visit

## 2024-07-11 ENCOUNTER — Ambulatory Visit

## 2024-07-12 ENCOUNTER — Encounter

## 2024-07-15 ENCOUNTER — Ambulatory Visit: Admitting: Occupational Therapy

## 2024-07-17 ENCOUNTER — Other Ambulatory Visit: Payer: Self-pay | Admitting: *Deleted

## 2024-07-17 DIAGNOSIS — D649 Anemia, unspecified: Secondary | ICD-10-CM

## 2024-07-18 ENCOUNTER — Encounter: Payer: Self-pay | Admitting: Oncology

## 2024-07-18 ENCOUNTER — Other Ambulatory Visit: Payer: Self-pay | Admitting: *Deleted

## 2024-07-18 ENCOUNTER — Inpatient Hospital Stay: Attending: Oncology

## 2024-07-18 ENCOUNTER — Inpatient Hospital Stay

## 2024-07-18 ENCOUNTER — Inpatient Hospital Stay (HOSPITAL_BASED_OUTPATIENT_CLINIC_OR_DEPARTMENT_OTHER): Admitting: Oncology

## 2024-07-18 VITALS — BP 122/62 | HR 57 | Temp 97.1°F | Resp 20 | Ht 66.0 in | Wt 182.3 lb

## 2024-07-18 DIAGNOSIS — D631 Anemia in chronic kidney disease: Secondary | ICD-10-CM | POA: Insufficient documentation

## 2024-07-18 DIAGNOSIS — Z7984 Long term (current) use of oral hypoglycemic drugs: Secondary | ICD-10-CM | POA: Diagnosis not present

## 2024-07-18 DIAGNOSIS — Z87891 Personal history of nicotine dependence: Secondary | ICD-10-CM | POA: Diagnosis not present

## 2024-07-18 DIAGNOSIS — Z23 Encounter for immunization: Secondary | ICD-10-CM | POA: Insufficient documentation

## 2024-07-18 DIAGNOSIS — N1832 Chronic kidney disease, stage 3b: Secondary | ICD-10-CM | POA: Diagnosis present

## 2024-07-18 DIAGNOSIS — D696 Thrombocytopenia, unspecified: Secondary | ICD-10-CM | POA: Diagnosis not present

## 2024-07-18 DIAGNOSIS — D649 Anemia, unspecified: Secondary | ICD-10-CM

## 2024-07-18 DIAGNOSIS — Z794 Long term (current) use of insulin: Secondary | ICD-10-CM | POA: Insufficient documentation

## 2024-07-18 DIAGNOSIS — Z66 Do not resuscitate: Secondary | ICD-10-CM | POA: Insufficient documentation

## 2024-07-18 DIAGNOSIS — I129 Hypertensive chronic kidney disease with stage 1 through stage 4 chronic kidney disease, or unspecified chronic kidney disease: Secondary | ICD-10-CM | POA: Insufficient documentation

## 2024-07-18 DIAGNOSIS — Z79899 Other long term (current) drug therapy: Secondary | ICD-10-CM | POA: Insufficient documentation

## 2024-07-18 DIAGNOSIS — C9110 Chronic lymphocytic leukemia of B-cell type not having achieved remission: Secondary | ICD-10-CM | POA: Insufficient documentation

## 2024-07-18 DIAGNOSIS — I719 Aortic aneurysm of unspecified site, without rupture: Secondary | ICD-10-CM | POA: Diagnosis not present

## 2024-07-18 DIAGNOSIS — N189 Chronic kidney disease, unspecified: Secondary | ICD-10-CM | POA: Diagnosis not present

## 2024-07-18 LAB — CMP (CANCER CENTER ONLY)
ALT: 11 U/L (ref 0–44)
AST: 11 U/L — ABNORMAL LOW (ref 15–41)
Albumin: 2.8 g/dL — ABNORMAL LOW (ref 3.5–5.0)
Alkaline Phosphatase: 93 U/L (ref 38–126)
Anion gap: 13 (ref 5–15)
BUN: 115 mg/dL — ABNORMAL HIGH (ref 8–23)
CO2: 15 mmol/L — ABNORMAL LOW (ref 22–32)
Calcium: 7.4 mg/dL — ABNORMAL LOW (ref 8.9–10.3)
Chloride: 106 mmol/L (ref 98–111)
Creatinine: 9.19 mg/dL (ref 0.61–1.24)
GFR, Estimated: 5 mL/min — ABNORMAL LOW (ref >60)
Glucose, Bld: 125 mg/dL — ABNORMAL HIGH (ref 70–99)
Potassium: 4.5 mmol/L (ref 3.5–5.1)
Sodium: 134 mmol/L — ABNORMAL LOW (ref 135–145)
Total Bilirubin: 0.8 mg/dL (ref 0.0–1.2)
Total Protein: 4.6 g/dL — ABNORMAL LOW (ref 6.5–8.1)

## 2024-07-18 LAB — CBC WITH DIFFERENTIAL/PLATELET
Abs Immature Granulocytes: 0.02 K/uL (ref 0.00–0.07)
Basophils Absolute: 0 K/uL (ref 0.0–0.1)
Basophils Relative: 0 %
Eosinophils Absolute: 0.1 K/uL (ref 0.0–0.5)
Eosinophils Relative: 1 %
HCT: 21.4 % — ABNORMAL LOW (ref 39.0–52.0)
Hemoglobin: 7.1 g/dL — ABNORMAL LOW (ref 13.0–17.0)
Immature Granulocytes: 0 %
Lymphocytes Relative: 78 %
Lymphs Abs: 7.6 K/uL — ABNORMAL HIGH (ref 0.7–4.0)
MCH: 33.6 pg (ref 26.0–34.0)
MCHC: 33.2 g/dL (ref 30.0–36.0)
MCV: 101.4 fL — ABNORMAL HIGH (ref 80.0–100.0)
Monocytes Absolute: 0.2 K/uL (ref 0.1–1.0)
Monocytes Relative: 2 %
Neutro Abs: 1.8 K/uL (ref 1.7–7.7)
Neutrophils Relative %: 19 %
Platelets: 22 K/uL — ABNORMAL LOW (ref 150–400)
RBC: 2.11 MIL/uL — ABNORMAL LOW (ref 4.22–5.81)
RDW: 15.1 % (ref 11.5–15.5)
WBC: 9.8 K/uL (ref 4.0–10.5)
nRBC: 0 % (ref 0.0–0.2)

## 2024-07-18 LAB — PREPARE RBC (CROSSMATCH)

## 2024-07-18 MED ORDER — INFLUENZA VAC SPLIT HIGH-DOSE 0.5 ML IM SUSY
0.5000 mL | PREFILLED_SYRINGE | Freq: Once | INTRAMUSCULAR | Status: AC
  Administered 2024-07-18: 0.5 mL via INTRAMUSCULAR
  Filled 2024-07-18: qty 0.5

## 2024-07-18 MED ORDER — EPOETIN ALFA-EPBX 40000 UNIT/ML IJ SOLN
40000.0000 [IU] | Freq: Once | INTRAMUSCULAR | Status: AC
  Administered 2024-07-18: 40000 [IU] via SUBCUTANEOUS
  Filled 2024-07-18: qty 1

## 2024-07-18 NOTE — Progress Notes (Unsigned)
 [facility]  Telephone:([phone] [phone]  ID: Patrick PARAS Storey Jr. OB: 21-Jun-1934  MR#: [id number]  RDW#:[id number]  Patient Care Team: Auston Reyes BIRCH, MD as PCP - General (Internal Medicine) [name], MD as Consulting Physician (Oncology)  CHIEF COMPLAINT: CLL, anemia/thrombocytopenia.  INTERVAL HISTORY: Patient returns to clinic today for repeat laboratory, further evaluation, and consideration of blood transfusion.  He continues to have chronic weakness and fatigue and easy bruising, but otherwise feels well.  His edema has improved after nephrology adjusted medications and he has added compression stockings.  He is tolerating Imbruvica  without significant side effects.  He denies any recent fevers or illnesses.  He has no neurologic complaints.  He has a fair appetite.  He denies any chest pain, shortness of breath, cough, or hemoptysis.  He denies any nausea, vomiting, constipation, or diarrhea. He has no urinary complaints.  Patient offers no further specific complaints today.  REVIEW OF SYSTEMS:   Review of Systems  Constitutional:  Positive for malaise/fatigue. Negative for diaphoresis, fever and weight loss.  Respiratory: Negative.  Negative for cough and shortness of breath.   Cardiovascular: Negative.  Negative for chest pain and leg swelling.  Gastrointestinal: Negative.  Negative for abdominal pain, blood in stool and melena.  Genitourinary: Negative.  Negative for dysuria.  Musculoskeletal: Negative.  Negative for back pain.  Skin: Negative.  Negative for rash.  Neurological:  Positive for weakness. Negative for dizziness, sensory change, focal weakness and headaches.  Endo/Heme/Allergies:  Bruises/bleeds easily.  Psychiatric/Behavioral: Negative.  The patient is not nervous/anxious.     As per HPI. Otherwise, a complete review of systems is negative.  PAST MEDICAL HISTORY: Past Medical History:  Diagnosis Date  .  Anxiety   . CKD (chronic kidney disease), stage III (HCC)   . CLL (chronic lymphocytic leukemia) (HCC)   . Diabetes mellitus without complication (HCC)   . Diverticulitis 04/09/2017  . History of kidney stones   . HLD (hyperlipidemia)   . HTN (hypertension)   . PKD (polycystic kidney disease)   . Sleep apnea   . Uncontrolled type 2 diabetes mellitus with hyperglycemia (HCC) 06/28/2018    PAST SURGICAL HISTORY: Past Surgical History:  Procedure Laterality Date  . CARDIAC CATHETERIZATION  12/1987  . CENTRAL LINE INSERTION N/A 04/11/2017   Procedure: Central Line Insertion;  Surgeon: Jama Cordella MATSU, MD;  Location: [facility] INVASIVE CV LAB;  Service: Cardiovascular;  Laterality: N/A;  . CERVICAL FUSION    . CHOLECYSTECTOMY    . CYSTOSCOPY W/ RETROGRADES Left 05/12/2017   Procedure: CYSTOSCOPY WITH RETROGRADE PYELOGRAM;  Surgeon: Chauncey Redell Agent, MD;  Location: [facility] ORS;  Service: Urology;  Laterality: Left;  . CYSTOSCOPY W/ URETERAL STENT PLACEMENT Left 05/12/2017   Procedure: CYSTOSCOPY WITH STENT REMOVAL;  Surgeon: Chauncey Redell Agent, MD;  Location: [facility] ORS;  Service: Urology;  Laterality: Left;  . CYSTOSCOPY WITH STENT PLACEMENT Left 04/11/2017   Procedure: CYSTOSCOPY WITH STENT PLACEMENT;  Surgeon: Nieves Cough, MD;  Location: [facility] ORS;  Service: Urology;  Laterality: Left;  . URETEROSCOPY  05/12/2017   Procedure: URETEROSCOPY;  Surgeon: Chauncey Redell Agent, MD;  Location: [facility] ORS;  Service: Urology;;    FAMILY HISTORY: Reported history of ovarian and lung cancer. Diabetes, hypertension.     ADVANCED DIRECTIVES:    HEALTH MAINTENANCE: Social History   Tobacco Use  . Smoking status: Former    Types: Cigarettes    Passive exposure: Past  . Smokeless tobacco: Never  . Tobacco  comments:    quit in Feb of 1997  Vaping Use  . Vaping status: Never Used  Substance Use Topics  . Alcohol use: No  . Drug use: No     Colonoscopy:  PAP:  Bone density:  Lipid  panel:  Allergies  Allergen Reactions  . Celecoxib Other (See Comments)    Increased Blood Pressure  . Chlorpromazine Nausea Only  . Iodinated Contrast Media Other (See Comments)    Stage 3 kidney disease, told IV dye would be bad for him  . Prednisone Other (See Comments)    Diabetic  . Amoxicillin-Pot Clavulanate Rash    Has patient had a PCN reaction causing immediate rash, facial/tongue/throat swelling, SOB or lightheadedness with hypotension: No Has patient had a PCN reaction causing severe rash involving mucus membranes or skin necrosis: No Has patient had a PCN reaction that required hospitalization: No Has patient had a PCN reaction occurring within the last 10 years: Yes If all of the above answers are NO, then may proceed with Cephalosporin use.   SABRA Penicillin V Potassium Rash    Current Outpatient Medications  Medication Sig Dispense Refill  . Aflibercept 2 MG/0.05ML SOLN Inject 1 Dose into the eye as directed. One injection into left eye every 8-9 weeks    . atorvastatin  (LIPITOR) 10 MG tablet Take 10 mg by mouth at bedtime.     . BD PEN NEEDLE NANO 2ND GEN 32G X 4 MM MISC USE AS DIRECTED ONCE A DAY    . carvedilol  (COREG ) 12.5 MG tablet Take 12.5 mg by mouth 2 (two) times daily with a meal.    . Cholecalciferol (VITAMIN D -3) 5000 units TABS Take 5,000 Units by mouth at bedtime.     . clonazePAM  (KLONOPIN ) 1 MG tablet Take 1 mg by mouth at bedtime.     . cyanocobalamin 500 MCG tablet Take 1,000 mcg by mouth at bedtime.     . diphenoxylate -atropine  (LOMOTIL ) 2.5-0.025 MG tablet Take 1 tablet by mouth 4 (four) times daily as needed for diarrhea or loose stools. 60 tablet 1  . empagliflozin (JARDIANCE) 10 MG TABS tablet 10 mg daily.    . epoetin  alfa-epbx (RETACRIT ) 40000 UNIT/ML injection Inject 40,000 Units into the skin every 6 (six) weeks. Depending on lab results (if needed)    . fexofenadine (ALLEGRA) 180 MG tablet Take 180 mg by mouth daily as needed for allergies.     . finasteride  (PROSCAR ) 5 MG tablet Take 1 tablet (5 mg total) by mouth daily. 90 tablet 3  . folic acid  (FOLVITE ) 400 MCG tablet Take 400 mcg by mouth daily.    . ibrutinib  (IMBRUVICA ) 420 MG tablet Take 1 tablet (420 mg total) by mouth daily. Take with a glass of water. 28 tablet 1  . insulin  glargine (LANTUS ) 100 UNIT/ML injection Inject 20-30 Units into the skin every morning. Takes Lantus  20 units if CBG less than 120 mg/dl or Lantus  30 units if CBG is over 120 mg/dl    . Multiple Vitamins-Minerals (PRESERVISION AREDS 2 PO) Take 1 capsule by mouth 2 (two) times daily.     . prednisoLONE acetate (PRED FORTE) 1 % ophthalmic suspension Place 1 drop into the right eye 4 (four) times daily.    . sertraline (ZOLOFT) 100 MG tablet Take 100 mg by mouth daily.    . tamsulosin  (FLOMAX ) 0.4 MG CAPS capsule TAKE 1 CAPSULE(0.4 MG) BY MOUTH DAILY AFTER SUPPER 90 capsule 3  . telmisartan (MICARDIS) 40 MG tablet Take  40 mg by mouth daily.    SABRA torsemide (DEMADEX) 10 MG tablet Take by mouth.    . torsemide (DEMADEX) 20 MG tablet 40 mg.    . triamcinolone (NASACORT) 55 MCG/ACT AERO nasal inhaler Place 2 sprays into the nose 2 (two) times daily as needed (allergies).      No current facility-administered medications for this visit.   Facility-Administered Medications Ordered in Other Visits  Medication Dose Route Frequency Provider Last Rate Last Admin  . epoetin  alfa-epbx (RETACRIT ) injection 40,000 Units  40,000 Units Subcutaneous Once Robert Sperl J, MD        OBJECTIVE: There were no vitals filed for this visit.      There is no height or weight on file to calculate BMI.    ECOG FS:1 - Symptomatic but completely ambulatory  General: Well-developed, well-nourished, no acute distress. Eyes: Pink conjunctiva, anicteric sclera. HEENT: Normocephalic, moist mucous membranes. Lungs: No audible wheezing or coughing. Heart: Regular rate and rhythm. Abdomen: Soft, nontender, no obvious  distention. Musculoskeletal: No edema, cyanosis, or clubbing. Neuro: Alert, answering all questions appropriately. Cranial nerves grossly intact. Skin: No rashes or petechiae noted. Psych: Normal affect.  LAB RESULTS:  Lab Results  Component Value Date   NA 135 07/04/2024   K 3.7 07/04/2024   CL 106 07/04/2024   CO2 16 (L) 07/04/2024   GLUCOSE 163 (H) 07/04/2024   BUN 94 (H) 07/04/2024   CREATININE 6.56 (H) 07/04/2024   CALCIUM  7.5 (L) 07/04/2024   PROT 4.5 (L) 07/04/2024   ALBUMIN 2.8 (L) 07/04/2024   AST 14 (L) 07/04/2024   ALT 13 07/04/2024   ALKPHOS 93 07/04/2024   BILITOT 0.9 07/04/2024   GFRNONAA 8 (L) 07/04/2024   GFRAA 37 (L) 11/19/2019    Lab Results  Component Value Date   WBC 9.8 07/18/2024   NEUTROABS PENDING 07/18/2024   HGB 7.1 (L) 07/18/2024   HCT 21.4 (L) 07/18/2024   MCV 101.4 (H) 07/18/2024   PLT 22 (L) 07/18/2024   Lab Results  Component Value Date   IRON 80 06/13/2024   TIBC 249 (L) 06/13/2024   IRONPCTSAT 32 06/13/2024   Lab Results  Component Value Date   FERRITIN 445 (H) 06/13/2024     STUDIES: No results found.  ASSESSMENT: CLL, anemia/thrombocytopenia.  PLAN:    CLL: Bone marrow biopsy on January 08, 2024 reported 69% clonal B-cell lymphocytes consistent with a diagnosis of CLL.  Patient does not have any peripheral lymphocytosis.  No other pathology was reported.  It is possible patient's chronic thrombocytopenia, worsening anemia, night sweats are related to his CLL, therefore patient was initiated on dose reduced Imbruvica  280 mg daily in early April 2025.  He increased to 420 mg at the end of May 2025 and is tolerating treatment well.  Patient's total white blood cell count is now within normal limits at 9.3.  Return to clinic tomorrow for 1 unit packed red blood cells.  Patient will then return to clinic in 2 weeks for repeat laboratory work, further evaluation, and continuation of treatment.   Anemia, unspecified: Possibly related  to progression of CLL, but we do not have a previous bone marrow biopsy for comparison.  Hemoglobin is decreased, but stable at 7.5.  Proceed with 40,000 units Retacrit  today.  Return to clinic tomorrow for 1 unit of blood and in 2 weeks as above.  All blood products should be irradiated. Thrombocytopenia: Chronic and unchanged.  Patient's platelet count is 18.  Monitor.  Bone marrow biopsy as above.  Previously, patient required multiple transfusions of platelets prior to his oral surgery.  He did not have any excessive bleeding during his surgery.  His count was as high as 95 in June 2016.  He does not require transfusion today.   Lymphadenopathy: Patient's most recent CT scan to evaluate his aortic aneurysm on Mar 05, 2024 did not reveal any obvious lymphadenopathy.   Aortic aneurysm: Appreciate vascular surgery input.  Given patient's poor kidney function, surgical repair is likely not possible.  Patient reports vascular surgery will repeat CT scan in 6 months.   Dental surgery: Successful and resolved. Renal insufficiency: Patient's GFR is slowly trending down and is now 11.  He discussed possible dialysis with nephrology but ultimately declined.  We briefly discussed hospice and end-of-life care given his worsening renal function, patient acknowledged that this likely will be necessary in the future. Edema: Improved with interventions from nephrology.  Patient expressed understanding and was in agreement with this plan. He also understands that He can call clinic at any time with any questions, concerns, or complaints.    Cancer Staging  Chronic lymphocytic leukemia (HCC) Staging form: Chronic Lymphocytic Leukemia / Small Lymphocytic Lymphoma, AJCC 8th Edition - Clinical stage from 01/12/2024: Modified Rai Stage IV (Modified Rai risk: High, Lymphocytosis: Absent, Adenopathy: Absent, Organomegaly: Absent, Anemia: Present, Thrombocytopenia: Present) - Signed by [name], MD on 01/13/2024     Evalene PARAS Jacobo, MD   07/18/2024 11:09 AM

## 2024-07-18 NOTE — Progress Notes (Unsigned)
 Patient has concerns to discuss today.

## 2024-07-19 ENCOUNTER — Inpatient Hospital Stay (HOSPITAL_BASED_OUTPATIENT_CLINIC_OR_DEPARTMENT_OTHER): Admitting: Hospice and Palliative Medicine

## 2024-07-19 ENCOUNTER — Encounter: Payer: Self-pay | Admitting: Oncology

## 2024-07-19 ENCOUNTER — Encounter: Payer: Self-pay | Admitting: Hospice and Palliative Medicine

## 2024-07-19 ENCOUNTER — Inpatient Hospital Stay

## 2024-07-19 VITALS — BP 122/47 | HR 62 | Temp 95.9°F | Resp 18

## 2024-07-19 DIAGNOSIS — N189 Chronic kidney disease, unspecified: Secondary | ICD-10-CM | POA: Diagnosis not present

## 2024-07-19 DIAGNOSIS — D631 Anemia in chronic kidney disease: Secondary | ICD-10-CM

## 2024-07-19 DIAGNOSIS — Z515 Encounter for palliative care: Secondary | ICD-10-CM | POA: Diagnosis not present

## 2024-07-19 DIAGNOSIS — I129 Hypertensive chronic kidney disease with stage 1 through stage 4 chronic kidney disease, or unspecified chronic kidney disease: Secondary | ICD-10-CM | POA: Diagnosis not present

## 2024-07-19 MED ORDER — SODIUM CHLORIDE 0.9% IV SOLUTION
250.0000 mL | INTRAVENOUS | Status: DC
  Administered 2024-07-19: 100 mL via INTRAVENOUS
  Filled 2024-07-19: qty 250

## 2024-07-19 MED ORDER — ACETAMINOPHEN 325 MG PO TABS
650.0000 mg | ORAL_TABLET | Freq: Once | ORAL | Status: AC
  Administered 2024-07-19: 650 mg via ORAL
  Filled 2024-07-19: qty 2

## 2024-07-19 NOTE — Patient Instructions (Signed)

## 2024-07-19 NOTE — Progress Notes (Signed)
 Palliative Medicine [facility] [phone] Fax:([phone]   Name: Patrick Payne [facility]. Date: 07/19/2024 MRN: [id number]  DOB: 07-02-1934  Patient Care Team: Auston Reyes BIRCH, MD as PCP - General (Internal Medicine) [name], MD as Consulting Physician (Oncology)    REASON FOR CONSULTATION: Clem PARAS Brister Mickey. is a 89 y.o. male with multiple medical problems including ESRD declined hemodialysis, CLL, transfusion dependent anemia, and thrombocytopenia.  Palliative care was consulted to address goals.  SOCIAL HISTORY:     reports that he has quit smoking. His smoking use included cigarettes. He has been exposed to tobacco smoke. He has never used smokeless tobacco. He reports that he does not drink alcohol and does not use drugs.  Patient is married to his wife of 64 years.  He lives at home.  They have a son and daughter.  Daughter lives nearby and son lives in [place].  They have 5 grandkids and multiple great-grandchildren.  Patient has a degree in Patent attorney from [employer].  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  ADVANCE DIRECTIVES:  Not on file  CODE STATUS: DNR/DNI (MOST form completed on 07/19/24)  PAST MEDICAL HISTORY: Past Medical History:  Diagnosis Date   Anxiety    CKD (chronic kidney disease), stage III (HCC)    CLL (chronic lymphocytic leukemia) (HCC)    Diabetes mellitus without complication (HCC)    Diverticulitis 04/09/2017   History of kidney stones    HLD (hyperlipidemia)    HTN (hypertension)    PKD (polycystic kidney disease)    Sleep apnea    Uncontrolled type 2 diabetes mellitus with hyperglycemia (HCC) 06/28/2018    PAST SURGICAL HISTORY:  Past Surgical History:  Procedure Laterality Date   CARDIAC CATHETERIZATION  12/1987   CENTRAL LINE INSERTION N/A 04/11/2017   Procedure: Central Line Insertion;  Surgeon: Jama Cordella MATSU, MD;  Location: [facility] INVASIVE CV LAB;  Service: Cardiovascular;  Laterality: N/A;   CERVICAL FUSION     CHOLECYSTECTOMY     CYSTOSCOPY W/ RETROGRADES Left 05/12/2017   Procedure: CYSTOSCOPY WITH RETROGRADE PYELOGRAM;  Surgeon: Chauncey Redell Agent, MD;  Location: [facility] ORS;  Service: Urology;  Laterality: Left;   CYSTOSCOPY W/ URETERAL STENT PLACEMENT Left 05/12/2017   Procedure: CYSTOSCOPY WITH STENT REMOVAL;  Surgeon: Chauncey Redell Agent, MD;  Location: [facility] ORS;  Service: Urology;  Laterality: Left;   CYSTOSCOPY WITH STENT PLACEMENT Left 04/11/2017   Procedure: CYSTOSCOPY WITH STENT PLACEMENT;  Surgeon: Nieves Cough, MD;  Location: [facility] ORS;  Service: Urology;  Laterality: Left;   URETEROSCOPY  05/12/2017   Procedure: URETEROSCOPY;  Surgeon: Chauncey Redell Agent, MD;  Location: [facility] ORS;  Service: Urology;;    HEMATOLOGY/ONCOLOGY HISTORY:  Oncology History  Chronic lymphocytic leukemia (HCC)  04/17/2014 Initial Diagnosis   Chronic lymphocytic leukemia (HCC)   01/12/2024 Cancer Staging   Staging form: Chronic Lymphocytic Leukemia / Small Lymphocytic Lymphoma, AJCC 8th Edition - Clinical stage from 01/12/2024: Modified Rai Stage IV (Modified Rai risk: High, Lymphocytosis: Absent, Adenopathy: Absent, Organomegaly: Absent, Anemia: Present, Thrombocytopenia: Present) - Signed by [name], MD on 01/13/2024     ALLERGIES:  is allergic to celecoxib, chlorpromazine, iodinated contrast media, prednisone, amoxicillin-pot clavulanate, and penicillin v potassium.  MEDICATIONS:  Current Outpatient Medications  Medication Sig Dispense Refill   Aflibercept 2 MG/0.05ML SOLN Inject 1 Dose into the eye as directed. One injection into left  eye every 8-9 weeks     atorvastatin  (LIPITOR) 10 MG tablet Take 10 mg by mouth at bedtime.      BD PEN NEEDLE NANO 2ND GEN 32G X 4 MM MISC USE AS DIRECTED ONCE A DAY     carvedilol  (COREG ) 12.5 MG tablet Take 12.5 mg by mouth 2 (two) times daily with a  meal.     Cholecalciferol (VITAMIN D -3) 5000 units TABS Take 5,000 Units by mouth at bedtime.      clonazePAM  (KLONOPIN ) 1 MG tablet Take 1 mg by mouth at bedtime.      cyanocobalamin 500 MCG tablet Take 1,000 mcg by mouth at bedtime.      diphenoxylate -atropine  (LOMOTIL ) 2.5-0.025 MG tablet Take 1 tablet by mouth 4 (four) times daily as needed for diarrhea or loose stools. 60 tablet 1   empagliflozin (JARDIANCE) 10 MG TABS tablet 10 mg daily.     epoetin  alfa-epbx (RETACRIT ) 40000 UNIT/ML injection Inject 40,000 Units into the skin every 6 (six) weeks. Depending on lab results (if needed)     fexofenadine (ALLEGRA) 180 MG tablet Take 180 mg by mouth daily as needed for allergies.     finasteride  (PROSCAR ) 5 MG tablet Take 1 tablet (5 mg total) by mouth daily. 90 tablet 3   folic acid  (FOLVITE ) 400 MCG tablet Take 400 mcg by mouth daily.     ibrutinib  (IMBRUVICA ) 420 MG tablet Take 1 tablet (420 mg total) by mouth daily. Take with a glass of water. 28 tablet 1   insulin  glargine (LANTUS ) 100 UNIT/ML injection Inject 20-30 Units into the skin every morning. Takes Lantus  20 units if CBG less than 120 mg/dl or Lantus  30 units if CBG is over 120 mg/dl     Multiple Vitamins-Minerals (PRESERVISION AREDS 2 PO) Take 1 capsule by mouth 2 (two) times daily.      prednisoLONE acetate (PRED FORTE) 1 % ophthalmic suspension Place 1 drop into the right eye 4 (four) times daily.     sertraline (ZOLOFT) 100 MG tablet Take 100 mg by mouth daily.     tamsulosin  (FLOMAX ) 0.4 MG CAPS capsule TAKE 1 CAPSULE(0.4 MG) BY MOUTH DAILY AFTER SUPPER 90 capsule 3   telmisartan (MICARDIS) 40 MG tablet Take 40 mg by mouth daily.     torsemide (DEMADEX) 10 MG tablet Take by mouth.     torsemide (DEMADEX) 20 MG tablet 40 mg.     triamcinolone (NASACORT) 55 MCG/ACT AERO nasal inhaler Place 2 sprays into the nose 2 (two) times daily as needed (allergies).      No current facility-administered medications for this visit.    Facility-Administered Medications Ordered in Other Visits  Medication Dose Route Frequency Provider Last Rate Last Admin   epoetin  alfa-epbx (RETACRIT ) injection 40,000 Units  40,000 Units Subcutaneous Once Finnegan, Timothy J, MD        VITAL SIGNS: There were no vitals taken for this visit. There were no vitals filed for this visit.  Estimated body mass index is 29.42 kg/m as calculated from the following:   Height as of 07/18/24: 5' 6 (1.676 m).   Weight as of 07/18/24: 182 lb 4.8 oz (82.7 kg).  LABS: CBC:    Component Value Date/Time   WBC 9.8 07/18/2024 1029   HGB 7.1 (L) 07/18/2024 1029   HGB 7.4 (L) 07/04/2024 1135   HGB 12.1 (L) 02/21/2014 1340   HCT 21.4 (L) 07/18/2024 1029   HCT 36.2 (L) 02/21/2014 1340   PLT 22 (L)  07/18/2024 1029   PLT 20 (L) 07/04/2024 1135   PLT 76 (L) 02/21/2014 1340   MCV 101.4 (H) 07/18/2024 1029   MCV 98 02/21/2014 1340   NEUTROABS 1.8 07/18/2024 1029   NEUTROABS 2.7 02/21/2014 1340   LYMPHSABS 7.6 (H) 07/18/2024 1029   LYMPHSABS 16.9 (H) 02/21/2014 1340   MONOABS 0.2 07/18/2024 1029   MONOABS 0.4 02/21/2014 1340   EOSABS 0.1 07/18/2024 1029   EOSABS 0.3 02/21/2014 1340   BASOSABS 0.0 07/18/2024 1029   BASOSABS 0.1 02/21/2014 1340   Comprehensive Metabolic Panel:    Component Value Date/Time   NA 134 (L) 07/18/2024 1029   NA 138 06/09/2017 1110   K 4.5 07/18/2024 1029   CL 106 07/18/2024 1029   CO2 15 (L) 07/18/2024 1029   BUN 115 (H) 07/18/2024 1029   BUN 26 06/09/2017 1110   CREATININE 9.19 (HH) 07/18/2024 1029   CREATININE 1.57 (H) 02/21/2014 1340   GLUCOSE 125 (H) 07/18/2024 1029   CALCIUM  7.4 (L) 07/18/2024 1029   AST 11 (L) 07/18/2024 1029   ALT 11 07/18/2024 1029   ALKPHOS 93 07/18/2024 1029   BILITOT 0.8 07/18/2024 1029   PROT 4.6 (L) 07/18/2024 1029   ALBUMIN 2.8 (L) 07/18/2024 1029    RADIOGRAPHIC STUDIES: No results found.  PERFORMANCE STATUS (ECOG) : 3 - Symptomatic, >50% confined to bed  Review of  Systems Unless otherwise noted, a complete review of systems is negative.  Physical Exam General: NAD Pulmonary: Unlabored Extremities: edema, no joint deformities Skin: no rashes Neurological: Weakness but otherwise nonfocal  IMPRESSION: Patient with multiple medical problems including CLL and transfusion dependent anemia.  Patient on Retacrit  and dose reduced Imbruvica .  He has also had declining renal function and has been followed by nephrology.  Patient was not interested in pursuing dialysis.  Hospice has been discussed in the past but patient wanted to continue transfusions and other supportive care.  Serum creatinine worsening and was up to 9.1 yesterday from 6.5 weeks ago.  Today, I met with patient and his wife while he was receiving a blood transfusion.  Patient spoke candidly about his mortality and says he recognizes that he is likely nearing the end of his life.  He has strong faith and is not interested in any significant measures to prolong his life.  He does not want resuscitation nor would he want his life prolonged artificially on machines.  He says he would be agreeable with possible short-term hospitalization if there was something that could be treated but would likely not even be interested in that if care were deemed futile.  He does wish to continue transfusions for now as he feels that it meaningfully contributes to his quality of life.  However, he also recognizes that he may reach a point in the near future where even transfusions might not be beneficial.   Patient's wife says that she agrees.  They have discussed with their children and family about their goals and wishes.  I completed a MOST form today. The patient and family outlined their wishes for the following treatment decisions:  Cardiopulmonary Resuscitation: Do Not Attempt Resuscitation (DNR/No CPR)  Medical Interventions: Limited Additional Interventions: Use medical treatment, IV fluids and cardiac  monitoring as indicated, DO NOT USE intubation or mechanical ventilation. May consider use of less invasive airway support such as BiPAP or CPAP. Also provide comfort measures. Transfer to the hospital if indicated. Avoid intensive care.   Antibiotics: Determine use of limitation of antibiotics when  infection occurs  IV Fluids: IV fluids for a defined trial period  Feeding Tube: No feeding tube   PLAN: - Continue current scope of treatment - DNR/DNI - MOST Form completed  Case and plan discussed with Dr. Jacobo  Patient expressed understanding and was in agreement with this plan. He also understands that He can call the clinic at any time with any questions, concerns, or complaints.   Time Total: 25 minutes  Visit consisted of counseling and education dealing with the complex and emotionally intense issues of symptom management and palliative care in the setting of serious and potentially life-threatening illness.Greater than 50%  of this time was spent counseling and coordinating care related to the above assessment and plan.  Signed by: Fonda Mower, PhD, NP-C

## 2024-07-20 LAB — TYPE AND SCREEN
ABO/RH(D): A POS
Antibody Screen: NEGATIVE
Unit division: 0

## 2024-07-20 LAB — BPAM RBC
Blood Product Expiration Date: 202510272359
ISSUE DATE / TIME: 202510100958
Unit Type and Rh: 5100

## 2024-07-22 ENCOUNTER — Ambulatory Visit: Admitting: Occupational Therapy

## 2024-07-29 ENCOUNTER — Encounter: Admitting: Occupational Therapy

## 2024-07-31 ENCOUNTER — Telehealth: Payer: Self-pay | Admitting: *Deleted

## 2024-07-31 NOTE — Telephone Encounter (Signed)
 Daughter wants to talk to [facility] after they have been seen by Dr. Jacobo.  He states that they already know that it is going to be close for him to pass away and the wife is trying to help him and she says he hardly drinks anything he is not bathing and when the wife tries to do it he does not want her to do it he, wants to be left alone.  Was a smart man but now he is not remembering things.  Kidney MD says that he could not go and get dialysis it is too bad.  But the patient did not want to take it anyway.  He wanted to see after everybody is seeing him and the injections she wanted to talk to him a few more minutes before she goes in to talk to the patient

## 2024-08-01 ENCOUNTER — Encounter: Payer: Self-pay | Admitting: Oncology

## 2024-08-01 ENCOUNTER — Inpatient Hospital Stay

## 2024-08-01 ENCOUNTER — Emergency Department
Admission: EM | Admit: 2024-08-01 | Discharge: 2024-08-01 | Disposition: A | Discharge status: Hospice | Attending: Emergency Medicine | Admitting: Emergency Medicine

## 2024-08-01 ENCOUNTER — Encounter: Payer: Self-pay | Admitting: Emergency Medicine

## 2024-08-01 ENCOUNTER — Inpatient Hospital Stay: Admitting: Oncology

## 2024-08-01 ENCOUNTER — Inpatient Hospital Stay: Admitting: Hospice and Palliative Medicine

## 2024-08-01 ENCOUNTER — Other Ambulatory Visit: Payer: Self-pay

## 2024-08-01 ENCOUNTER — Emergency Department

## 2024-08-01 DIAGNOSIS — R7989 Other specified abnormal findings of blood chemistry: Secondary | ICD-10-CM | POA: Diagnosis not present

## 2024-08-01 DIAGNOSIS — Z789 Other specified health status: Secondary | ICD-10-CM

## 2024-08-01 DIAGNOSIS — D631 Anemia in chronic kidney disease: Secondary | ICD-10-CM | POA: Diagnosis not present

## 2024-08-01 DIAGNOSIS — E1122 Type 2 diabetes mellitus with diabetic chronic kidney disease: Secondary | ICD-10-CM | POA: Insufficient documentation

## 2024-08-01 DIAGNOSIS — I120 Hypertensive chronic kidney disease with stage 5 chronic kidney disease or end stage renal disease: Secondary | ICD-10-CM | POA: Insufficient documentation

## 2024-08-01 DIAGNOSIS — Z992 Dependence on renal dialysis: Secondary | ICD-10-CM | POA: Diagnosis not present

## 2024-08-01 DIAGNOSIS — N186 End stage renal disease: Secondary | ICD-10-CM

## 2024-08-01 DIAGNOSIS — R600 Localized edema: Secondary | ICD-10-CM | POA: Insufficient documentation

## 2024-08-01 DIAGNOSIS — Z515 Encounter for palliative care: Secondary | ICD-10-CM | POA: Diagnosis not present

## 2024-08-01 DIAGNOSIS — R531 Weakness: Secondary | ICD-10-CM | POA: Diagnosis present

## 2024-08-01 DIAGNOSIS — D649 Anemia, unspecified: Secondary | ICD-10-CM

## 2024-08-01 LAB — T4, FREE: Free T4: 0.46 ng/dL — ABNORMAL LOW (ref 0.61–1.12)

## 2024-08-01 LAB — BRAIN NATRIURETIC PEPTIDE: B Natriuretic Peptide: 180.4 pg/mL — ABNORMAL HIGH (ref 0.0–100.0)

## 2024-08-01 LAB — RESP PANEL BY RT-PCR (RSV, FLU A&B, COVID)  RVPGX2
Influenza A by PCR: NEGATIVE
Influenza B by PCR: NEGATIVE
Resp Syncytial Virus by PCR: NEGATIVE
SARS Coronavirus 2 by RT PCR: NEGATIVE

## 2024-08-01 LAB — CBC WITH DIFFERENTIAL/PLATELET
Abs Immature Granulocytes: 0.02 K/uL (ref 0.00–0.07)
Basophils Absolute: 0 K/uL (ref 0.0–0.1)
Basophils Relative: 0 %
Eosinophils Absolute: 0.1 K/uL (ref 0.0–0.5)
Eosinophils Relative: 1 %
HCT: 18.5 % — ABNORMAL LOW (ref 39.0–52.0)
Hemoglobin: 5.8 g/dL — ABNORMAL LOW (ref 13.0–17.0)
Immature Granulocytes: 0 %
Lymphocytes Relative: 55 %
Lymphs Abs: 3.8 K/uL (ref 0.7–4.0)
MCH: 32.2 pg (ref 26.0–34.0)
MCHC: 31.4 g/dL (ref 30.0–36.0)
MCV: 102.8 fL — ABNORMAL HIGH (ref 80.0–100.0)
Monocytes Absolute: 0.2 K/uL (ref 0.1–1.0)
Monocytes Relative: 3 %
Neutro Abs: 2.8 K/uL (ref 1.7–7.7)
Neutrophils Relative %: 41 %
Platelets: 18 K/uL — CL (ref 150–400)
RBC: 1.8 MIL/uL — ABNORMAL LOW (ref 4.22–5.81)
RDW: 17.2 % — ABNORMAL HIGH (ref 11.5–15.5)
WBC: 6.8 K/uL (ref 4.0–10.5)
nRBC: 0 % (ref 0.0–0.2)

## 2024-08-01 LAB — COMPREHENSIVE METABOLIC PANEL WITH GFR
ALT: 12 U/L (ref 0–44)
AST: 10 U/L — ABNORMAL LOW (ref 15–41)
Albumin: 2.5 g/dL — ABNORMAL LOW (ref 3.5–5.0)
Alkaline Phosphatase: 80 U/L (ref 38–126)
Anion gap: 16 — ABNORMAL HIGH (ref 5–15)
BUN: 160 mg/dL — ABNORMAL HIGH (ref 8–23)
CO2: 11 mmol/L — ABNORMAL LOW (ref 22–32)
Calcium: 7 mg/dL — ABNORMAL LOW (ref 8.9–10.3)
Chloride: 104 mmol/L (ref 98–111)
Creatinine, Ser: 14.59 mg/dL — ABNORMAL HIGH (ref 0.61–1.24)
GFR, Estimated: 3 mL/min — ABNORMAL LOW (ref >60)
Glucose, Bld: 83 mg/dL (ref 70–99)
Potassium: 5.2 mmol/L — ABNORMAL HIGH (ref 3.5–5.1)
Sodium: 131 mmol/L — ABNORMAL LOW (ref 135–145)
Total Bilirubin: 0.6 mg/dL (ref 0.0–1.2)
Total Protein: 4.4 g/dL — ABNORMAL LOW (ref 6.5–8.1)

## 2024-08-01 LAB — TSH: TSH: 6.401 u[IU]/mL — ABNORMAL HIGH (ref 0.350–4.500)

## 2024-08-01 LAB — TROPONIN I (HIGH SENSITIVITY): Troponin I (High Sensitivity): 258 ng/L (ref <18)

## 2024-08-01 MED ORDER — ONDANSETRON HCL 4 MG/2ML IJ SOLN
INTRAMUSCULAR | Status: AC
  Filled 2024-08-01: qty 2

## 2024-08-01 MED ORDER — ONDANSETRON HCL 4 MG/2ML IJ SOLN: 4.0000 mg | Freq: Once | INTRAMUSCULAR | Status: DC

## 2024-08-01 MED ORDER — ONDANSETRON HCL 4 MG/2ML IJ SOLN
4.0000 mg | Freq: Four times a day (QID) | INTRAMUSCULAR | Status: DC | PRN
  Administered 2024-08-01: 4 mg via INTRAVENOUS

## 2024-08-01 MED ORDER — ONDANSETRON HCL 4 MG/2ML IJ SOLN
4.0000 mg | Freq: Once | INTRAMUSCULAR | Status: AC
  Administered 2024-08-01: 4 mg via INTRAVENOUS
  Filled 2024-08-01: qty 2

## 2024-08-01 NOTE — ED Provider Notes (Signed)
 [facility] Provider Note    Event Date/Time   First MD Initiated Contact with Patient 08/01/24 [phone]     (approximate)   History   Weakness (PT to ER via EMS from home for c/o increased weakness and decreased hearing and vision with today being unable to get out of bed )   HPI  Patrick J Campoli Jr. is a 89 y.o. male with ESRD who decline hemodialysis, CLL, transfusion dependent anemia,, thrombocytopenia who comes in with concerns for increasing weakness.  Patient has had progressive increasing weakness.  However today was the first day that they could not get him up out of bed.  He denies any chest pain, shortness of breath reports just feeling fatigue and feeling weak.  He denies any infectious symptoms.  He does report getting infusions with Retacrit  and Imbruvica .  So although he has declined dialysis he want to continue these so is not currently on hospice.  His last serum creatinine was up to 9.1 yesterday.  I reviewed the note from palliative on 07/19/2024 where patient is DNR and would consider IV fluids, antibiotics for short-term.   Physical Exam   Triage Vital Signs: ED Triage Vitals  Encounter Vitals Group     BP 08/01/24 0918 (!) 127/50     Girls Systolic BP Percentile --      Girls Diastolic BP Percentile --      Boys Systolic BP Percentile --      Boys Diastolic BP Percentile --      Pulse Rate 08/01/24 0918 60     Resp 08/01/24 0918 17     Temp 08/01/24 0918 97.8 F (36.6 C)     Temp Source 08/01/24 0918 Oral     SpO2 08/01/24 0918 93 %     Weight 08/01/24 0916 198 lb 6.6 oz (90 kg)     Height 08/01/24 0916 5' 6 (1.676 m)     Head Circumference --      Peak Flow --      Pain Score --      Pain Loc --      Pain Education --      Exclude from Growth Chart --     Most recent vital signs: Vitals:   08/01/24 0918  BP: (!) 127/50  Pulse: 60  Resp: 17  Temp: 97.8 F (36.6 C)  SpO2: 93%     General: Awake, no distress.   CV:  Good peripheral perfusion.  Resp:  Normal effort.  Abd:  No distention.  Soft and nontender Other:  Able to lift both legs up off the bed stable to lift both arms off the bed.  Chronic venous stasis changes noted of the lower extremities with mild edema    ED Results / Procedures / Treatments   Labs (all labs ordered are listed, but only abnormal results are displayed) Labs Reviewed  RESP PANEL BY RT-PCR (RSV, FLU A&B, COVID)  [id number]  CBC WITH DIFFERENTIAL/PLATELET  COMPREHENSIVE METABOLIC PANEL WITH GFR  URINALYSIS, ROUTINE W REFLEX MICROSCOPIC  TSH  T4, FREE  BRAIN NATRIURETIC PEPTIDE  TROPONIN I (HIGH SENSITIVITY)     EKG  My interpretation of EKG:  Normal sinus rate of 60 without any ST elevation or T wave inversions, normal intervals  RADIOLOGY I have reviewed the ct  personally and interpreted from 02/2024 where patient had decompressed bladder.   PROCEDURES:  Critical Care performed: No  .1-3 Lead EKG Interpretation  Performed by: Ernest Shuck  E, MD Authorized by: Ernest Ronal BRAVO, MD     Interpretation: normal     ECG rate:  60   ECG rate assessment: normal     Rhythm: sinus rhythm     Ectopy: none     Conduction: normal      MEDICATIONS ORDERED IN ED: Medications  ondansetron  (ZOFRAN ) injection 4 mg (4 mg Intravenous Given 08/01/24 1043)     IMPRESSION / MDM / ASSESSMENT AND PLAN / ED COURSE  I reviewed the triage vital signs and the nursing notes.   Patient's presentation is most consistent with acute presentation with potential threat to life or bodily function.   Patient comes in with concerns for increasing weakness to suspect this is getting major related to his worsening kidney function but will check basic labs to evaluate for anemia, CKD, elevated BUN, urine evaluate for UTI, COVID test.  Will get some labs to make sure no signs of severe CHF case we want to give him fluids.  Given he does have a little bit of edema in his bilateral legs  but I suspect this is more likely related to venous stasis.  BNP shows elevated at 180 Hemoglobin is 5.8, platelets are 18. CMP shows a BUN of 160 and a creatinine of 14 Troponin is significantly elevated.  This is concerning for anemia and BUN elevation causing weakness.  I suspect this is related to his end-stage kidney disease but he is adamant that he does not want hospice and did confirm this with me as well.  Will need to discuss with palliative and family for goals of care conversation.  Borders from palliative care has come down to see patient and the plan is for transfer to hospice inpatient.  Discussed with both patient's daughter and patient's wife about plan of care given the above.  They were agreeable and preferred to go to hospice inpatient.  This was communicated to the the patient by family as they want to be the ones to tell him the results and he was agreeable.  The wife has been a Engineer, civil (consulting) for many years and is then taking care of him but unfortunately just with the weakness she cannot take care of him at home therefore everyone is in agreement with hospice home.  The patient is on the cardiac monitor to evaluate for evidence of arrhythmia and/or significant heart rate changes.      FINAL CLINICAL IMPRESSION(S) / ED DIAGNOSES   Final diagnoses:  Weakness  Transitioned from self-care to hospice  Anemia, unspecified type  ESRD (end stage renal disease) (HCC)     Rx / DC Orders   ED Discharge Orders     None        Note:  This document was prepared using Dragon voice recognition software and may include unintentional dictation errors.   Ernest Ronal BRAVO, MD 08/01/24 [phone]

## 2024-08-01 NOTE — TOC Initial Note (Signed)
 Transition of Care (TOC) - Initial/Assessment Note    Patient Details  Name: Patrick Payne. MRN: [id number] Date of Birth: 02-05-1934  Transition of Care [facility]) CM/SW Contact:    [place] L Shykeria Sakamoto, LCSW Phone Number: 08/01/2024, 11:23 AM  Clinical Narrative:                  Secure chat received regarding choice for [facility] hospice. CSW contacted family and spoke with Mr. Marget, son in Social worker. Mr. Marget confirmed that it is the family's choice.   CSW advised Lonell Nova to proceed with referral.        Patient Goals and CMS Choice            Expected Discharge Plan and Services                                              Prior Living Arrangements/Services                       Activities of Daily Living      Permission Sought/Granted                  Emotional Assessment              Admission diagnosis:  Weakness Patient Active Problem List   Diagnosis Date Noted   Edema 05/08/2024   CKD (chronic kidney disease) stage 4, GFR 15-29 ml/min (HCC) 03/12/2024   Arthralgia of right ankle 12/22/2023   Exudative age-related macular degeneration of right eye with active choroidal neovascularization (HCC) 03/21/2023   Preop cardiovascular exam 01/19/2023   Acute renal failure superimposed on stage 3b chronic kidney disease (HCC) 12/14/2022   Community acquired pneumonia of right lower lobe of lung 12/14/2022   Hydroureter on left 12/14/2022   Acute diarrhea 12/14/2022   Thrombocytopenia 12/14/2022   Urinary tract disease 09/28/2021   Hypertension 09/28/2021   Chronic kidney disease, stage IV (severe) (HCC) 05/24/2021   Carotid stenosis 10/23/2020   Aortic atherosclerosis 11/08/2019   Anemia in chronic kidney disease 11/05/2019   Secondary hyperparathyroidism of renal origin 11/05/2019   Benign hypertensive kidney disease with chronic kidney disease 08/05/2019   Nephrolithiasis 08/05/2019   Proteinuria 08/05/2019   GAD (generalized  anxiety disorder) 07/03/2018   DM type 2 with diabetic peripheral neuropathy (HCC) 06/28/2018   Polycystic kidney disease - right side 04/10/2017   Flank pain 04/09/2017   Long term current use of insulin  (HCC) 07/16/2014   Insulin  long-term use (HCC) 07/16/2014   Abdominal aortic aneurysm (AAA) 04/17/2014   Chronic kidney disease, stage 3b (HCC) - baseline SCr 2.2-2.5 04/17/2014   Chronic lymphocytic leukemia (HCC) 04/17/2014   Type 2 diabetes mellitus with chronic kidney disease, with long-term current use of insulin  (HCC) 04/17/2014   Essential hypertension 04/17/2014   HLD (hyperlipidemia) 04/17/2014   Obstructive apnea 04/17/2014   AAA (abdominal aortic aneurysm) 04/17/2014   CKD (chronic kidney disease), stage III (HCC) 04/17/2014   CLL (chronic lymphocytic leukemia) (HCC) 04/17/2014   Type 2 diabetes mellitus (HCC) 04/17/2014   H/O malignant neoplasm of skin 06/07/2013   PCP:  Auston Reyes BIRCH, MD Pharmacy:   [id number] [place], [place] - [address] AT [facility] OF [address] [facility] [address] [place] [address] Phone: [phone] Fax: [phone]  [place] [facility] [address] [place] [address] Phone: [phone] Fax: [phone]  [id number] [place], [place] - [address] AT [facility] OF [address] [place] [address] Phone: [phone] Fax: [phone]     Social Drivers of Health (SDOH) Social History: SDOH Screenings   Food Insecurity: No Food Insecurity (07/19/2024)  Housing: Low Risk  (07/19/2024)  Transportation Needs: No Transportation Needs (07/19/2024)  Utilities: Not At Risk (07/31/2023)   Received from [facility]  Depression [phone]): Low Risk  (07/19/2024)  Financial Resource Strain: Low Risk  (07/31/2023)   Received from [facility]  Tobacco Use: Medium Risk (08/01/2024)    SDOH Interventions:     Readmission Risk Interventions     No data to display

## 2024-08-01 NOTE — Telephone Encounter (Signed)
 Per phone call - pt in ER now

## 2024-08-01 NOTE — Consult Note (Signed)
 Palliative Medicine [facility] at [facility] [phone] [phone]   Name: Patrick Payne [facility]. Date: 08/01/2024 MRN: [id number]  DOB: 04/04/1934  Patient Care Team: Auston Reyes BIRCH, MD as PCP - General (Internal Medicine) [name], MD as Consulting Physician (Oncology)    REASON FOR CONSULTATION: Patrick Payne. is a 89 y.o. male with multiple medical problems including ESRD declined hemodialysis, CLL, transfusion dependent anemia, and thrombocytopenia.  Patient presented to the emergency department with weakness.  Found to have worsening renal failure.  Palliative care was consulted to address goals.   SOCIAL HISTORY:     reports that he has quit smoking. His smoking use included cigarettes. He has been exposed to tobacco smoke. He has never used smokeless tobacco. He reports that he does not drink alcohol and does not use drugs.  Patient is married to his wife of 64 years.  He lives at home.  They have a son and daughter.  Daughter lives nearby and son lives in [place].  They have 5 grandkids and multiple great-grandchildren.  Patient has a degree in Patent attorney from [employer].  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  ADVANCE DIRECTIVES:  Not on file  CODE STATUS: DNR  PAST MEDICAL HISTORY: Past Medical History:  Diagnosis Date   Anxiety    CKD (chronic kidney disease), stage III (HCC)    CLL (chronic lymphocytic leukemia) (HCC)    Diabetes mellitus without complication (HCC)    Diverticulitis 04/09/2017   History of kidney stones    HLD (hyperlipidemia)    HTN (hypertension)    PKD (polycystic kidney disease)    Sleep apnea    Uncontrolled type 2 diabetes mellitus with hyperglycemia (HCC) 06/28/2018    PAST SURGICAL HISTORY:  Past Surgical History:  Procedure Laterality Date   CARDIAC CATHETERIZATION  12/1987   CENTRAL LINE INSERTION N/A  04/11/2017   Procedure: Central Line Insertion;  Surgeon: Jama Cordella MATSU, MD;  Location: [facility] INVASIVE CV LAB;  Service: Cardiovascular;  Laterality: N/A;   CERVICAL FUSION     CHOLECYSTECTOMY     CYSTOSCOPY W/ RETROGRADES Left 05/12/2017   Procedure: CYSTOSCOPY WITH RETROGRADE PYELOGRAM;  Surgeon: Chauncey Redell Agent, MD;  Location: [facility] ORS;  Service: Urology;  Laterality: Left;   CYSTOSCOPY W/ URETERAL STENT PLACEMENT Left 05/12/2017   Procedure: CYSTOSCOPY WITH STENT REMOVAL;  Surgeon: Chauncey Redell Agent, MD;  Location: [facility] ORS;  Service: Urology;  Laterality: Left;   CYSTOSCOPY WITH STENT PLACEMENT Left 04/11/2017   Procedure: CYSTOSCOPY WITH STENT PLACEMENT;  Surgeon: Nieves Cough, MD;  Location: [facility] ORS;  Service: Urology;  Laterality: Left;   URETEROSCOPY  05/12/2017   Procedure: URETEROSCOPY;  Surgeon: Chauncey Redell Agent, MD;  Location: [facility] ORS;  Service: Urology;;    HEMATOLOGY/ONCOLOGY HISTORY:  Oncology History  Chronic lymphocytic leukemia (HCC)  04/17/2014 Initial Diagnosis   Chronic lymphocytic leukemia (HCC)   01/12/2024 Cancer Staging   Staging form: Chronic Lymphocytic Leukemia / Small Lymphocytic Lymphoma, AJCC 8th Edition - Clinical stage from 01/12/2024: Modified Rai Stage IV (Modified Rai risk: High, Lymphocytosis: Absent, Adenopathy: Absent, Organomegaly: Absent, Anemia: Present, Thrombocytopenia: Present) - Signed by [name], MD on 01/13/2024     ALLERGIES:  is allergic to celecoxib, chlorpromazine, iodinated contrast media, prednisone, amoxicillin-pot clavulanate, and penicillin v potassium.  MEDICATIONS:  No current facility-administered medications for this encounter.   Current Outpatient Medications  Medication  Sig Dispense Refill   ACCU-CHEK GUIDE TEST test strip 1 strip by Other route as needed.     Aflibercept 2 MG/0.05ML SOLN Inject 1 Dose into the eye as directed. One injection into left eye every 8-9 weeks     atorvastatin  (LIPITOR) 10 MG  tablet Take 10 mg by mouth at bedtime.      BD PEN NEEDLE NANO 2ND GEN 32G X 4 MM MISC USE AS DIRECTED ONCE A DAY     carvedilol  (COREG ) 12.5 MG tablet Take 12.5 mg by mouth 2 (two) times daily with a meal.     Cholecalciferol (VITAMIN D -3) 5000 units TABS Take 5,000 Units by mouth at bedtime.      clonazePAM  (KLONOPIN ) 1 MG tablet Take 1 mg by mouth at bedtime.      cyanocobalamin 500 MCG tablet Take 1,000 mcg by mouth at bedtime.      diphenoxylate -atropine  (LOMOTIL ) 2.5-0.025 MG tablet Take 1 tablet by mouth 4 (four) times daily as needed for diarrhea or loose stools. 60 tablet 1   empagliflozin (JARDIANCE) 10 MG TABS tablet 10 mg daily.     epoetin  alfa-epbx (RETACRIT ) 40000 UNIT/ML injection Inject 40,000 Units into the skin every 6 (six) weeks. Depending on lab results (if needed)     fexofenadine (ALLEGRA) 180 MG tablet Take 180 mg by mouth daily as needed for allergies.     finasteride  (PROSCAR ) 5 MG tablet Take 1 tablet (5 mg total) by mouth daily. 90 tablet 3   folic acid  (FOLVITE ) 400 MCG tablet Take 400 mcg by mouth daily.     ibrutinib  (IMBRUVICA ) 420 MG tablet Take 1 tablet (420 mg total) by mouth daily. Take with a glass of water. 28 tablet 1   insulin  glargine (LANTUS ) 100 UNIT/ML injection Inject 20-30 Units into the skin every morning. Takes Lantus  20 units if CBG less than 120 mg/dl or Lantus  30 units if CBG is over 120 mg/dl     Multiple Vitamins-Minerals (PRESERVISION AREDS 2 PO) Take 1 capsule by mouth 2 (two) times daily.      prednisoLONE acetate (PRED FORTE) 1 % ophthalmic suspension Place 1 drop into the right eye 4 (four) times daily.     sertraline (ZOLOFT) 100 MG tablet Take 100 mg by mouth daily.     tamsulosin  (FLOMAX ) 0.4 MG CAPS capsule TAKE 1 CAPSULE(0.4 MG) BY MOUTH DAILY AFTER SUPPER 90 capsule 3   telmisartan (MICARDIS) 40 MG tablet Take 40 mg by mouth daily.     torsemide (DEMADEX) 10 MG tablet Take by mouth. (Patient not taking: Reported on 07/19/2024)      torsemide (DEMADEX) 20 MG tablet 40 mg.     triamcinolone (NASACORT) 55 MCG/ACT AERO nasal inhaler Place 2 sprays into the nose 2 (two) times daily as needed (allergies).      Facility-Administered Medications Ordered in Other Encounters  Medication Dose Route Frequency Provider Last Rate Last Admin   epoetin  alfa-epbx (RETACRIT ) injection 40,000 Units  40,000 Units Subcutaneous Once Finnegan, Timothy J, MD        VITAL SIGNS: BP (!) 127/50   Pulse 60   Temp 97.8 F (36.6 C) (Oral)   Resp 17   Ht 5' 6 (1.676 m)   Wt 198 lb 6.6 oz (90 kg)   SpO2 93%   BMI 32.02 kg/m  Filed Weights   08/01/24 0916  Weight: 198 lb 6.6 oz (90 kg)    Estimated body mass index is 32.02 kg/m as calculated from  the following:   Height as of this encounter: 5' 6 (1.676 m).   Weight as of this encounter: 198 lb 6.6 oz (90 kg).  LABS: CBC:    Component Value Date/Time   WBC 6.8 08/01/2024 0922   HGB 5.8 (L) 08/01/2024 0922   HGB 7.4 (L) 07/04/2024 1135   HGB 12.1 (L) 02/21/2014 1340   HCT 18.5 (L) 08/01/2024 0922   HCT 36.2 (L) 02/21/2014 1340   PLT 18 (LL) 08/01/2024 0922   PLT 20 (L) 07/04/2024 1135   PLT 76 (L) 02/21/2014 1340   MCV 102.8 (H) 08/01/2024 0922   MCV 98 02/21/2014 1340   NEUTROABS 2.8 08/01/2024 0922   NEUTROABS 2.7 02/21/2014 1340   LYMPHSABS 3.8 08/01/2024 0922   LYMPHSABS 16.9 (H) 02/21/2014 1340   MONOABS 0.2 08/01/2024 0922   MONOABS 0.4 02/21/2014 1340   EOSABS 0.1 08/01/2024 0922   EOSABS 0.3 02/21/2014 1340   BASOSABS 0.0 08/01/2024 0922   BASOSABS 0.1 02/21/2014 1340   Comprehensive Metabolic Panel:    Component Value Date/Time   NA 131 (L) 08/01/2024 0922   NA 138 06/09/2017 1110   K 5.2 (H) 08/01/2024 0922   CL 104 08/01/2024 0922   CO2 11 (L) 08/01/2024 0922   BUN 160 (H) 08/01/2024 0922   BUN 26 06/09/2017 1110   CREATININE 14.59 (H) 08/01/2024 0922   CREATININE 9.19 (HH) 07/18/2024 1029   CREATININE 1.57 (H) 02/21/2014 1340   GLUCOSE 83  08/01/2024 0922   CALCIUM  7.0 (L) 08/01/2024 0922   AST 10 (L) 08/01/2024 0922   AST 11 (L) 07/18/2024 1029   ALT 12 08/01/2024 0922   ALT 11 07/18/2024 1029   ALKPHOS 80 08/01/2024 0922   BILITOT 0.6 08/01/2024 0922   BILITOT 0.8 07/18/2024 1029   PROT 4.4 (L) 08/01/2024 0922   ALBUMIN 2.5 (L) 08/01/2024 9077    RADIOGRAPHIC STUDIES: No results found.  PERFORMANCE STATUS (ECOG) : 4 - Bedbound  Review of Systems Unless otherwise noted, a complete review of systems is negative.  Physical Exam General: NAD Cardiovascular: regular rate and rhythm Pulmonary: clear ant fields Abdomen: soft, nontender, + bowel sounds GU: no suprapubic tenderness Extremities: no edema, no joint deformities Skin: no rashes Neurological: Weakness but otherwise nonfocal  IMPRESSION: Patient seen in the emergency department.  I met privately with patient's wife and daughter.  Patient with transfusion dependent CLL.  He has ESRD and has declined hemodialysis.  Patient presented to emergency department with acute weakness.  He had essentially become bedbound.  Oral intake minimal. Found to have worsening serum creatinine and hyperkalemia.  Some intermittent confusion.  Family confirmed that patient's goals are aligned with comfort measures only.  Patient has also communicated this to me previously.  In light of multiorgan dysfunction, life expectancy is likely limited to days.  Family feel that burden of end-of-life care would be too great to manage at home.  They would prefer for patient to transfer to hospice facility for comfort measures.    Case discussed with the hospice physician -Dr. Deane Finder who agreed to accept transfer.  PLAN: - Best supportive care - Transfer to hospice facility for end of life care - DNR/DNI  Case and plan discussed with Dr. Jacobo  Time Total: 60 minutes  Visit consisted of counseling and education dealing with the complex and emotionally intense issues of  symptom management and palliative care in the setting of serious and potentially life-threatening illness.Greater than 50%  of this time  was spent counseling and coordinating care related to the above assessment and plan.  Signed by: Fonda Mower, PhD, NP-C

## 2024-08-01 NOTE — Progress Notes (Signed)
 [facility] Liaison Note   Received request from Transitions of Care Manger for family interest in [facility].  Eligibility has been confirmed.  Met with family to confirm interest and explain services.  Family agreeable to transfer today. Transitions of Care manager aware.  RN please call report to [phone]  [facility]) prior to patient leaving the unit.  Please send signed and completed DNR with patient at discharge.  Thank you  Marinell Nova,  [facility] Liaison 336 [phone]

## 2024-08-02 ENCOUNTER — Inpatient Hospital Stay: Admitting: Hospice and Palliative Medicine

## 2024-08-02 ENCOUNTER — Inpatient Hospital Stay

## 2024-08-06 ENCOUNTER — Other Ambulatory Visit: Payer: Self-pay

## 2024-08-07 ENCOUNTER — Encounter: Admitting: Occupational Therapy

## 2024-08-10 DEATH — deceased

## 2024-08-14 ENCOUNTER — Encounter: Admitting: Occupational Therapy

## 2024-08-21 ENCOUNTER — Ambulatory Visit: Admitting: Urology

## 2024-08-22 ENCOUNTER — Ambulatory Visit: Payer: Self-pay | Admitting: Urology

## 2024-08-22 ENCOUNTER — Encounter: Admitting: Occupational Therapy

## 2024-08-27 ENCOUNTER — Encounter: Admitting: Occupational Therapy

## 2024-09-04 ENCOUNTER — Encounter: Admitting: Occupational Therapy

## 2024-09-11 ENCOUNTER — Encounter: Admitting: Occupational Therapy

## 2024-09-12 NOTE — Progress Notes (Signed)
 Given pt weakness covid test ordered

## 2024-09-17 ENCOUNTER — Ambulatory Visit (INDEPENDENT_AMBULATORY_CARE_PROVIDER_SITE_OTHER): Admitting: Vascular Surgery

## 2024-09-18 ENCOUNTER — Encounter: Admitting: Occupational Therapy

## 2024-09-25 ENCOUNTER — Encounter: Admitting: Occupational Therapy

## 2024-10-02 ENCOUNTER — Encounter: Admitting: Occupational Therapy

## 2024-10-16 ENCOUNTER — Encounter: Admitting: Occupational Therapy

## 2024-10-23 ENCOUNTER — Encounter: Admitting: Occupational Therapy
